# Patient Record
Sex: Female | Born: 1935 | Race: Black or African American | Hispanic: No | Smoking: Never smoker
Health system: Southern US, Community
[De-identification: ages and names within clinical notes are randomized; demographics above are authoritative.]

## PROBLEM LIST (undated history)

## (undated) DIAGNOSIS — I739 Peripheral vascular disease, unspecified: Secondary | ICD-10-CM

## (undated) DIAGNOSIS — K219 Gastro-esophageal reflux disease without esophagitis: Secondary | ICD-10-CM

## (undated) DIAGNOSIS — I779 Disorder of arteries and arterioles, unspecified: Secondary | ICD-10-CM

## (undated) DIAGNOSIS — M1990 Unspecified osteoarthritis, unspecified site: Secondary | ICD-10-CM

## (undated) DIAGNOSIS — E669 Obesity, unspecified: Secondary | ICD-10-CM

## (undated) DIAGNOSIS — E785 Hyperlipidemia, unspecified: Secondary | ICD-10-CM

## (undated) DIAGNOSIS — I10 Essential (primary) hypertension: Secondary | ICD-10-CM

## (undated) DIAGNOSIS — G4730 Sleep apnea, unspecified: Secondary | ICD-10-CM

## (undated) DIAGNOSIS — D649 Anemia, unspecified: Secondary | ICD-10-CM

## (undated) DIAGNOSIS — IMO0001 Reserved for inherently not codable concepts without codable children: Secondary | ICD-10-CM

## (undated) DIAGNOSIS — U071 COVID-19: Secondary | ICD-10-CM

## (undated) HISTORY — PX: WISDOM TOOTH EXTRACTION: SHX21

## (undated) HISTORY — DX: Hyperlipidemia, unspecified: E78.5

## (undated) HISTORY — PX: CHOLECYSTECTOMY: SHX55

---

## 1998-07-28 ENCOUNTER — Encounter: Payer: Self-pay | Admitting: Orthopedic Surgery

## 1998-08-02 ENCOUNTER — Observation Stay (HOSPITAL_COMMUNITY): Admission: RE | Admit: 1998-08-02 | Discharge: 1998-08-03 | Payer: Self-pay | Admitting: Orthopedic Surgery

## 1999-05-20 ENCOUNTER — Other Ambulatory Visit: Admission: RE | Admit: 1999-05-20 | Discharge: 1999-05-20 | Payer: Self-pay | Admitting: Obstetrics and Gynecology

## 2000-03-16 ENCOUNTER — Encounter: Payer: Self-pay | Admitting: Orthopedic Surgery

## 2000-03-16 ENCOUNTER — Encounter: Admission: RE | Admit: 2000-03-16 | Discharge: 2000-03-16 | Payer: Self-pay | Admitting: Orthopedic Surgery

## 2000-04-05 ENCOUNTER — Encounter: Payer: Self-pay | Admitting: Orthopedic Surgery

## 2000-04-12 ENCOUNTER — Ambulatory Visit (HOSPITAL_COMMUNITY): Admission: RE | Admit: 2000-04-12 | Discharge: 2000-04-12 | Payer: Self-pay | Admitting: Orthopedic Surgery

## 2000-06-14 ENCOUNTER — Observation Stay (HOSPITAL_COMMUNITY): Admission: RE | Admit: 2000-06-14 | Discharge: 2000-06-15 | Payer: Self-pay | Admitting: Orthopedic Surgery

## 2001-03-01 ENCOUNTER — Other Ambulatory Visit: Admission: RE | Admit: 2001-03-01 | Discharge: 2001-03-01 | Payer: Self-pay | Admitting: Obstetrics and Gynecology

## 2002-05-20 ENCOUNTER — Other Ambulatory Visit: Admission: RE | Admit: 2002-05-20 | Discharge: 2002-05-20 | Payer: Self-pay | Admitting: Obstetrics and Gynecology

## 2002-06-06 ENCOUNTER — Encounter: Admission: RE | Admit: 2002-06-06 | Discharge: 2002-09-04 | Payer: Self-pay | Admitting: Family Medicine

## 2002-07-03 ENCOUNTER — Encounter: Admission: RE | Admit: 2002-07-03 | Discharge: 2002-10-01 | Payer: Self-pay | Admitting: Family Medicine

## 2002-07-03 ENCOUNTER — Encounter: Payer: Self-pay | Admitting: Cardiology

## 2002-07-03 ENCOUNTER — Ambulatory Visit (HOSPITAL_COMMUNITY): Admission: RE | Admit: 2002-07-03 | Discharge: 2002-07-03 | Payer: Self-pay | Admitting: Cardiology

## 2002-07-04 ENCOUNTER — Encounter: Payer: Self-pay | Admitting: Cardiology

## 2003-06-03 ENCOUNTER — Other Ambulatory Visit: Admission: RE | Admit: 2003-06-03 | Discharge: 2003-06-03 | Payer: Self-pay | Admitting: Obstetrics and Gynecology

## 2003-09-05 HISTORY — PX: JOINT REPLACEMENT: SHX530

## 2003-12-28 ENCOUNTER — Inpatient Hospital Stay (HOSPITAL_COMMUNITY): Admission: RE | Admit: 2003-12-28 | Discharge: 2003-12-31 | Payer: Self-pay | Admitting: Orthopedic Surgery

## 2003-12-31 ENCOUNTER — Inpatient Hospital Stay (HOSPITAL_COMMUNITY)
Admission: RE | Admit: 2003-12-31 | Discharge: 2004-01-12 | Payer: Self-pay | Admitting: Physical Medicine & Rehabilitation

## 2004-02-18 ENCOUNTER — Encounter: Admission: RE | Admit: 2004-02-18 | Discharge: 2004-03-17 | Payer: Self-pay | Admitting: Orthopedic Surgery

## 2004-09-13 ENCOUNTER — Encounter: Admission: RE | Admit: 2004-09-13 | Discharge: 2004-12-12 | Payer: Self-pay | Admitting: Family Medicine

## 2004-10-18 ENCOUNTER — Encounter: Admission: RE | Admit: 2004-10-18 | Discharge: 2004-11-10 | Payer: Self-pay | Admitting: Family Medicine

## 2005-02-02 ENCOUNTER — Ambulatory Visit (HOSPITAL_COMMUNITY): Admission: RE | Admit: 2005-02-02 | Discharge: 2005-02-02 | Payer: Self-pay | Admitting: *Deleted

## 2006-01-18 ENCOUNTER — Encounter: Admission: RE | Admit: 2006-01-18 | Discharge: 2006-01-30 | Payer: Self-pay | Admitting: Family Medicine

## 2006-10-02 ENCOUNTER — Encounter: Admission: RE | Admit: 2006-10-02 | Discharge: 2006-12-31 | Payer: Self-pay

## 2008-06-04 ENCOUNTER — Ambulatory Visit: Payer: Self-pay | Admitting: Vascular Surgery

## 2010-02-25 ENCOUNTER — Encounter: Admission: RE | Admit: 2010-02-25 | Discharge: 2010-02-25 | Payer: Self-pay | Admitting: Family Medicine

## 2011-01-17 NOTE — Procedures (Signed)
CAROTID DUPLEX EXAM   INDICATION:  Followup carotid artery disease.   HISTORY:  Diabetes:  Yes.  Cardiac:  No.  Hypertension:  Yes.  Smoking:  No.  Previous Surgery:  No.  CV History:  Tingling in face, both sides intermittently for  approximately 1 year.  Amaurosis Fugax No, Paresthesias No, Hemiparesis No                                       RIGHT             LEFT  Brachial systolic pressure:  Brachial Doppler waveforms:         SCA-triphasic     SCA-triphasic  Vertebral direction of flow:        Antegrade         Antegrade  DUPLEX VELOCITIES (cm/sec)  CCA peak systolic                   74                74  ECA peak systolic                   63                87  ICA peak systolic                   68                [id number]  ICA end diastolic                   21                33  PLAQUE MORPHOLOGY:                  Mixed             Mixed  PLAQUE AMOUNT:                      Mild              Mild  PLAQUE LOCATION:                    ICA               ICA   IMPRESSION:  1. Right ICA shows evidence of 1-39% stenosis.  2. Left ICA shows evidence of 40-59% stenosis (low end of range).  3. Tortuous ICAs, especially the left which may overestimate      velocities.  4. No significant changes from previous study.  5. Note, deep vessels due to body habitus.   ___________________________________________  Nelda Severe. Kellie Simmering, M.D.   AS/MEDQ  D:  06/04/2008  T:  06/04/2008  Job:  [id number]

## 2011-01-20 NOTE — Discharge Summary (Signed)
NAME:  Jeanne [name], Jeanne [name] NO.:  [id number]   MEDICAL RECORD NO.:  [id number]                   PATIENT TYPE:  INP   LOCATION:  0482                                 FACILITY:  [facility]   PHYSICIAN:  Tarri Glenn, M.D.               DATE OF BIRTH:  11-04-1935   DATE OF ADMISSION:  12/28/2003  DATE OF DISCHARGE:                                 DISCHARGE SUMMARY   ADMITTING DIAGNOSES:  1. Severe osteoarthritis of right knee.  2. Type 1 diabetes.  3. Hypertension.  4. Morbid obesity.   DISCHARGE DIAGNOSES:  1. Severe osteoarthritis of right knee.  2. Type 1 diabetes.  3. Hypertension.  4. Morbid obesity.  5. Postoperative anemia.  6. Hyponatremia.   OPERATION:  On December 28, 2003 the patient underwent complex primary  Osteonics total knee replacement arthroplasty of the right knee utilizing a  revision tibial component.  Ronald A. Gioffre, M.D. assisted.   CONSULTS:  Dr. Lenna Sciara L. Lovena Le and [employer], et. al.   BRIEF HISTORY:  This is a 75 year old lady who has deterioration of her  right knee secondary to osteoarthritis.  Arthroscopy in the past as well as  Hyalgan injections have not ameliorated her discomfort.  The x-rays showed  bone-on-bone deformity with near total erosion of the articular cartilage.  She is an extremely obese lady who has been trying to lose weight but her  right knee has deteriorated to the point she has difficulty getting about or  even exercising.  It was decided she would benefit from total knee  replacement arthroplasty and she was scheduled for the above surgery.   COURSE IN THE HOSPITAL:  The patient tolerated the surgical procedure quite  well.  We called [facility] per Dr. March Rummage recommendation and  they followed her medically during her hospitalization.  At his suggestion  she was placed on Glucomander and they also followed that.  She had no  medical crises.  The patient was extremely slow in  her level of activity due  to her mass.  She could not tolerate the CPM machine very well and really  only achieved a little over 30 degrees.  It was felt that she might benefit  with an inpatient rehab consult.  We asked Dr. Naaman Plummer to see the patient and  he agreed that she benefit for same and arrangements were made for her  discharge there.   The patient was placed on Lovenox/Coumadin protocol postoperatively for  prevention of DVT.  Her calf remained soft on the right and Homans sign was  negative.  The Hemovac drain was pulled on postoperative day #1.  The wound  remained dry with only minimal amount of serosanguineous drainage.  The  patient did develop a postoperative anemia.  We checked her hemoglobin.  Preoperatively her hemoglobin was 11.7, hematocrit was 33.3.  Postoperatively it dropped 1 g and on the day of discharge it  was 8.4.  I  discussed with Dr. Sherrian Divers whether to transfuse.  We usually transfuse at  this level but since she was being transferred to [facility] rehab it was felt  rather than hold up the transfer that blood replacement could occur there.  She had a mild hyponatremia and I withheld her water and encouraged juices  and colas.  The patient had a urinary tract infection preoperatively per  urinalysis but when rechecked it was essentially negative.   Laboratory values in the hospital hematologically showed a mild anemia  preoperatively with RBC normal at 4.35, hemoglobin was 11.7, hematocrit was  35.3.  Final hemoglobin was 8.4, hematocrit was 25.4.  Sodium was 133.  Other laboratory values were essentially within normal limits.  Glucose  stayed above 100.  The last one done was 159.  Other blood chemistries and  sodium were normal.  Urinalysis colony count on culture was pending at the  time of transfer.  Chest x-ray showed no acute lung disease.  Electrocardiogram showed normal sinus rhythm, normal ECG.   CONDITION ON DISCHARGE:  Improved and stable.   PLAN:   The patient is transferred to [place] for an intensive  inpatient rehabilitation program.  She may continue weightbearing as  tolerated on the right lower extremity.  She may flex the knee as tolerated.  Again, we will certainly leave the care of this patient in the capable hands  of the physicians and physicians' assistants in the rehabilitation center.  Should there be any questions concerning her overall medical history, either  Dr. Sheryn Bison can be contacted or [facility], et. al.   CURRENT MEDICATIONS AT THE TIME OF DISCHARGE:  1. Colace b.i.d.  2. Trinsicon t.i.d.  3. Lovenox protocol.  4. Vasotec 10 mg daily.  5. Ditropan XL 5 mg daily.  6. Hydrochlorothiazide 50 mg one daily.  7. Avandia 4 mg daily.  8. Zocor 20 mg daily.  9. Diltiazem 240 mg daily.  10.      Reglan 10 mg p.r.n. nausea.  11.      Robaxin as a muscle relaxant.  12.      Hydrocodone for pain control as well as Tylox for severe pain.   Her PCA was discontinued at discharge.     Dooley L. Vanita Ingles.                 Tarri Glenn, M.D.    DLU/MEDQ  D:  12/31/2003  T:  12/31/2003  Job:  [id number]   cc:   Russ Halo. Sheryn Bison, M.D.  [address]  [place]  [place] [address]  Fax: [phone]

## 2011-01-20 NOTE — Op Note (Signed)
NAME:  PROSPERITY, ANDRASKO NO.:  [id number]   MEDICAL RECORD NO.:  [id number]                   PATIENT TYPE:  INP   LOCATION:  [place]                                 FACILITY:  [facility]   PHYSICIAN:  Tarri Glenn, M.D.               DATE OF BIRTH:  08-14-1936   DATE OF PROCEDURE:  12/28/2003  DATE OF DISCHARGE:                                 OPERATIVE REPORT   PREOPERATIVE DIAGNOSIS:  1. Osteoarthritis, right knee.  2. Morbid obesity.   POSTOPERATIVE DIAGNOSIS:  1. Osteoarthritis, right knee.  2. Morbid obesity.   OPERATION:  Complex primary Osteonics total knee replacement, right,  utilizing a revision tibial component.   SURGEON:  Tarri Glenn, M.D.   ASSISTANT:  Kipp Brood. Gladstone Lighter, M.D.   ANESTHESIA:  General.   INDICATIONS FOR PROCEDURE:  She was morbidly obese.  Had significant pain in  the right knee with some knee subluxation of the patella on the femur.  Because of the size and deformity, I felt that the revision tibial stem, as  discussed below, would be the treatment of choice, along with the Scorpio  posterior cruciate sacrificing modality.   PROCEDURE:  Prophylactic antibiotics.  Satisfactory general anesthesia.  A  Foley catheter inserted.  We went without a pneumonic tourniquet because of  her size and short thigh.  Right leg was prepped with Duraprep from groin to  toes and draped in the sterile field.  Used a lateral hip positioner and a  Sure-foot stabilizer.  Ioban applied to the vertical midline incision down  to the patellar mechanism with median parapatellar incision at the end of  the joint.  Bleeders were aggressively managed.  The patellar mechanism was  freed up, but because of her size, I had performed most of the case with it  not everted but off to the lateral side.  Osteophytes were around the femur.  Patella were removed.  The pes anserinus and medial collateral ligament were  undermined off the tibia.  The knee  was flexed, and remnants of the menisci,  ACL and posterior cruciate ligaments were removed.  I then made a 5 to 6  inch drill hole in the distal femur followed by the canal finder and the  axis liner, set a 5 degree cut at the distal femur for the right knee.  I  elected to take 12 mm off because of the tightness of her knee.  Because of  the tightness, I then made a 11 o'clock cut on the tibia, which gave more  room for the sizing cradle, which we felt was a #7.  Scribe lines were  placed in the distal femur.  I then placed a distal femoral cutting jig and  then made my anterior posterior cuts and posterior anterior chamfers.  Returning to the tibia, I sized a 7 template, and we then made our initial  primary cut, first making an intramedullary  drill hole followed by a step-  cut drill and used the intramedullary rod with a 0 degree cut posteriorly, 4  mm off the medial side.  I then used a micro saw to make initial cuts for  the post and unicondylar notch of the femur.  I then placed a guide,  normally making the patellar groove but also removing bone and impacting it  for the post.  I then used the laminar spreader and removed remnants of bone  and soft tissue posteriorly, and we went through a trial reduction and found  that we had adequate bone resection and would be a 12 or 15 mm spacer within  the extension, then used the 10 mm recessed cutting jig to make this cut for  26 mm patella.  We then used the guide for making three fixation drill  holes.  A trial prosthesis is placed, and excess bone removed from around  the perimeter of the prosthesis.  We then returned to the tibia, where we  used the revision equipment and found that a 4 mm off-set was the  appropriate size and with a 200 degree setting for ideal positioning of the  tibial tray.  The tray was then placed, and the tripod apparatus made to  ream down for a 15 mm in diameter, 80 mm length of the stem.  When the   preparation had been completed, we went through a trial reduction with trial  components which were put together with the appropriate parameters.  The  knee was then taken through a nice range of motion and based on this, we  went ahead and constructed the final tibial components, as described above.  The wound was meanwhile water-picked and methacrylate-mixed.  The three  individual components were then placed, impacting first the tibia and  removing excess methacrylate, then the femur and holding the knee in  extension.  The 50 mm spacer while the patella was methyl methacrylate  in.  When the methacrylate hardened, we removed remnants of methacrylate from  around the components and went through another trial reduction with a 15 mm  spacer being the ideal size.  The final 15 mm spacer was placed, the knee  reduced, and found to be nice and stable.  Lateral release was performed,  and the Hemovac placed.  The wound was then closed in layers with #1 Vicryl,  in two layers in the quadriceps, and distally in two layers in the synovium  and capsular layers.  I then closed the subcutaneous tissue with interrupted  #1 Vicryl deep and 3-0 Vicryl superficially with staples in the skin.  Betadine and Adaptic dry sterile dressing were applied.  At the time of this  dictation, she was on her way to the recovery room in satisfactory condition  with no complications.  Estimated blood loss was 700 cc with 1 unit of blood  replacement.                                               Tarri Glenn, M.D.    JA/MEDQ  D:  12/28/2003  T:  12/28/2003  Job:  [id number]

## 2011-01-20 NOTE — Op Note (Signed)
[facility]  Patient:    Jeanne [name], Jeanne [name]                      MRN: [id number] Proc. Date: 06/14/00 Attending:  Jeneen Rinks P. Aplington, M.D.                           Operative Report  PREOPERATIVE DIAGNOSES: 1. Osteoarthritis. 2. Torn medial meniscus, right knee  POSTOPERATIVE DIAGNOSES: 1. Osteoarthritis. 2. Torn medial and lateral menisci, right knee.  OPERATION:  Right knee arthroscopy with: 1. Partial medial and lateral meniscectomy. 2. Shaving of medial and lateral femoral condyle. 3. Removal of loose body, medial knee joint.  SURGEON:  Laurice Record. Aplington, M.D.  ASSISTANT:  Nurse.  ANESTHESIA:  General.  PATHOLOGY AND JUSTIFICATION FOR PROCEDURE:  Despite the fact that she has significant osteoarthritis in both knees, she had done very well with a left knee arthroscopy, and we attempted a right knee arthroscopy on August 9 but because of the size of her knee (she weighs over 375 pounds), she had what I felt was a lot of extravasation from around the cannula, and early in the operation we were unable to continue because of no inflow.  Accordingly, at this time we are prepared with much longer hip arthroscopy instruments.  DESCRIPTION OF PROCEDURE:  Satisfactory general anesthesia, pneumatic tourniquet, thigh stabilizer.  Right knee and leg were prepped with Duraprep and draped in a sterile field.  The pneumatic tourniquet was not inflated but was held in reserve.  I had hoped to avoid venous congestion in this manner. The anatomy of the knee was marked out with a marking pen.  I first entered the joint through an anterior medial portal and with joint fluid coming forth, I felt that we were definitely in the joint and I inflated the knee from this portal.  I then used my superior medial inflow portal from the previous surgery and used the extra-long cannula from the hip arthroscopy apparatus and was able to enter the joint, and backflow  confirmed that we were in the knee joint.  I then used this cannula for the inflow.  Next, through an anterolateral portal, we looked first at the medial compartment of the knee joint.  There was a large amount of debris in the way of the medial femoral condyle.  I cleaned things up with the 4.2 shaver, performed partial synovectomy, shaving the medial femoral condyle and also removing a few millimeter size bony loose body, which was smooth, with small baskets.  Even with the large inflow, we had difficulties with the inflow cutting off because of the size of the knee and positioning of the knee, but I was gradually able to work my way posteriorly, and she had a significant tear involving the entire posterior third of the medial meniscus.  Beginning first at the curve, I began resecting meniscus with baskets and then shaved down the remaining rim until smooth with 3.5 shaver and in addition shaved the medial femoral condyle additionally posteriorly and slightly to the medial tibial plateau.  Final remaining rim was nice and stable.  Pre- and post-films were taken.  The ACL was intact.  I was unable to look up in the medial gutter and suprapatellar area from this portal because of the cannula.  I then reversed portals.  She had a large amount of debris and wear in the lateral compartment of the knee joint as well,  and this was once again debrided out with the 4.2 shaver.  I was then able to look at the lateral meniscus, which was torn extensively from the mid-lateral portion all the way into the intercondylar area.  This was once again trimmed with baskets and shaved with the 3.5 shaver, including additional trimming of both the lateral femoral condyle and lateral tibial plateau.  I then looked up the lateral gutter and suprapatellar area.  The patella had a little wear but nothing that needed shaving.  The knee joint was then irrigated until clear and all fluid possible removed.  The two  anterior portals were closed with 4-0 nylon.  Marcaine 0.5% 20 cc with adrenalin and 4 mg of morphine were then instilled through the inflow apparatus, which was removed and this portal closed with 4-0 nylon as well.  ABD and adaptic and dry sterile dressing were applied.  She tolerated the procedure well and was taken to the recovery room in satisfactory condition with no known complications. DD:  06/14/00 TD:  06/15/00 Job: [id number]

## 2011-01-20 NOTE — H&P (Signed)
NAME:  Jeanne [name], Jeanne [name] NO.:  [id number]   MEDICAL RECORD NO.:  [id number]                   PATIENT TYPE:  INP   LOCATION:  NA                                   FACILITY:  [facility]   PHYSICIAN:  Tarri Glenn, M.D.               DATE OF BIRTH:  11-22-1935   DATE OF ADMISSION:  12/28/2003  DATE OF DISCHARGE:                                HISTORY & PHYSICAL   CHIEF COMPLAINT:  Pain in my right knee.   HISTORY OF PRESENT ILLNESS:  This 75 year old lady has been seen by Dr.  Shellia Carwin for continuing progressive problems concerning her right knee. She  has had arthroscopic surgery and Hyalgan injections into this knee in the  past, but unfortunately this has not improved her function of the knee. She  is a very large, obese lady which puts stress on the knees, particularly on  the right. She has tried weight loss programs in the past. She is currently  on Curves. The patient has crepitus on range of motion of the right knee,  has an early valgus deformity. X-rays show bone-on-bone deformity,  specifically depreciating the medial shift of the femur on the tibia.  Osteophytes are also seen. This is a relatively active lady, but  unfortunately she has been unable to truly participate in an exercise  program due to her right knee pain. She, therefore, is unable to loose a  significant amount of weight that could protect the knee and her condition  now has deteriorated where total knee replacement arthroplasty is indicated.  After much discussion, an approval for the surgery as far as her medical  status is concerned by Dr. Aura Dials and it was decided to go ahead  with surgery.   PAST MEDICAL HISTORY:  Dr. Sheryn Bison is treating her for hypertension, insulin-  dependent diabetes mellitus (type 1).   CURRENT MEDICATIONS:  1. Diltiazem 120 mg one daily.  2. Pravachol 40 mg daily.  3. Ditropan 5 mg daily.  4. Metformin 2.5 mg daily.  5. Hydrochlorothiazide  50 mg daily.  6. Avandia 8 mg daily.  7. Enalapril 10 mg daily.  8. She also takes insulin.   ALLERGIES:  No known drug allergies, however she had some sort of reaction  of which she thinks to be oxycodone in the past after some dental surgery.  She will check this in her records and bring the information to the hospital  with her.   PAST SURGICAL HISTORY:  Arthroscopic surgery to both knees, with the one on  the right done in 2001. She had a cholecystectomy in 1963.  Dr. Aura Dials of [facility] is her family  physician.   FAMILY HISTORY:  Positive for heart disease in her mother and brother.  Almost of her brothers and sisters have hypertension, diabetes in her  daughter and brother. Her son had melanoma. Her husband  had a stroke on the  right and she has osteoarthritis of course.   SOCIAL HISTORY:  She is married, retired. No intake of alcohol or tobacco  products. She has three children. Her family will be her major caregivers  when she does arrive home.   REVIEW OF SYSTEMS:  CNS: No seizure, syncope, paralysis, numbness, or double  vision. RESPIRATORY:  No productive cough, hemoptysis. Shortness of breath  secondary to her obesity. GASTROINTESTINAL: She does well with her  medication. No nausea, vomiting, melena or bloody stool. GENITOURINARY: No  dysuria, hematuria, or discharge. MUSCULOSKELETAL: Primarily in present  illness.   PHYSICAL EXAMINATION:  VITAL SIGNS: Pulse 72 and regular, respirations 12  and labored., blood pressure 140/82 with large cuff.  GENERAL: A very cooperative, friendly, obese 75 year old black female.  HEENT:  Normocephalic. PERRLA. Oropharynx is clear.  CHEST: Clear to auscultation. No rhonchi or rales. However, breath sounds  are distant.  HEART: Regular rate and rhythm.  No murmurs are heard, but heart sounds are  distant.  ABDOMEN: Obese, soft, and nontender.  RECTAL/BREASTS/GENITALIA: Not done; not  pertinent to the present illness.  EXTREMITIES: Crepitus with range of motion of the right knee; tenderness to  palpation particularly on the medial aspect.  NEUROLOGIC: Intact to the right lower extremity. She has sensation intact in  the lower extremities as well.   ADMISSION DIAGNOSES:  1. Severe osteoarthritis of the right knee.  2. Type 1 diabetes.  3. Hypertension.  4. Morbid obesity.   PLAN:  The patient will be admitted to [facility] and undergo  right total knee replacement, arthroplasty. Will ask the [facility] hospitalist,  Dr. March Rummage direction, to follow along with [place] during this patient's  hospitalization particularly for her insulin-dependent diabetes mellitus.   We discussed her postoperative course after her acute care setting ended. In  all probability, she would do very well in the inpatient rehabilitation  program for a week or so. She does have her family rotating to assist her at  home when she does get home.  We will certainly ask for a rehab consult  during this hospitalization.     Dooley L. Vanita Ingles.                 Tarri Glenn, M.D.    DLU/MEDQ  D:  12/22/2003  T:  12/22/2003  Job:  [id number]   cc:   Russ Halo. Sheryn Bison, M.D.  [address]  [place]  [place] [address]  Fax: [phone]

## 2011-01-20 NOTE — Consult Note (Signed)
NAME:  NYHA, PADO NO.:  [id number]   MEDICAL RECORD NO.:  [id number]                   PATIENT TYPE:  INP   LOCATION:  [place]                                 FACILITY:  [facility]   PHYSICIAN:  Melissa L. Lovena Le, MD               DATE OF BIRTH:  Oct 12, 1935   DATE OF CONSULTATION:  DATE OF DISCHARGE:                                   CONSULTATION   REQUESTING PHYSICIAN:  Dr. Tarri Glenn.   REASON FOR CONSULTATION:  Postoperative hypertension and diabetes  management.   HISTORY OF PRESENT ILLNESS:  The patient is a 75 year old, morbidly obese,  African-American female with past medical history for insulin-dependent  diabetes and hypertension.  She is postop day #1, status post right TKR and  has been treated overnight with a Glucomander for her blood sugar control.  She tolerated this well.  Her sugars have been in the low 100s.  Blood  pressures have been mildly elevated over the course of the night but not  excessively.   PAST MEDICAL HISTORY:  1. Hypertension.  2. Insulin-dependent diabetes.  3. Morbid obesity.  4. Occasional migraines.  5. Osteoarthritis.   PAST SURGICAL HISTORY:  1. Cholecystectomy.  2. Two knee arthroscopies prior to this surgery.   ALLERGIES:  Possibly to OXYCODONE, although it is unclear what the reaction  was.   HOME MEDICATIONS:  1. Diltiazem 120 mg daily.  2. Pravachol 40 mg daily.  3. Ditropan 5 mg daily.  4. Metformin 500 mg b.i.d.  5. Hydrochlorothiazide 50 mg daily.  6. Avandia 8 mg daily.  7. Enalapril 10 mg daily.  8. Insulin in the morning is regular insulin 6 units and NPH 30 units; in     the evening regular is __________ units and NPH 6 units.  9. She requires CPAP at home.   HOSPITAL MEDICATIONS:  1. She is on a PCA morphine pump.  2. Coumadin 10 mg q.h.s.  3. Lovenox 30 mg q.12h.  4. Glucomander.  5. __________ 100 mg b.i.d.  6. Trinsicon/Foltrin caps t.i.d.  7. Ditropan 5 mg daily.  8.  Hydrochlorothiazide 50 mg daily.  9. Diltiazem 120 mg daily.  10.      Avandia 8 mg daily to start back today.  11.      Zocor 20 mg daily.  12.      Vitamin B and C, Ancef, Reglan, Zofran, and Robaxin all p.r.n.   SOCIAL HISTORY:  She does not smoke.  She does not drink.  She is married.   FAMILY HISTORY:  Her mother had coronary artery disease.   REVIEW OF SYSTEMS:  The patient complains of well-controlled pain, no  shortness of breath, no dysuria, positive thirst, all else appeared to be  negative.   PHYSICAL EXAMINATION:  VITAL SIGNS:  Temperature 96.5, blood pressure  194/64, however, this a.m. is 162/70.  Pulse is 88, respirations 20,  saturations 96%  on 2 liters.  GENERAL:  She is in no acute distress.  HEENT:  Normocephalic, atraumatic.  Pupils equal, round, and reactive to  light.  Extraocular muscles are intact.  Mucous membranes are moist.  NECK:  Supple and thick.  There are no bruits.  No lymph nodes.  CHEST:  Decreased in all fields secondary to size.  She has no yeast under  her breasts or under her panus.  CARDIOVASCULAR:  Regular rate and rhythm, positive S1, S2, no S3, S4.  No  murmurs, rubs, or gallops.  ABDOMEN:  Morbid obesity.  No yeast under her panus.  Positive bowel sounds.  No pain.  Hepatosplenomegaly cannot be appreciated.  EXTREMITIES:  Her right in a splint and ice-packed.  She does have good cap  refill and 2+ pulses.  Her left leg is in a TED stocking.  It does show 2+  pulses.  She does have a Foley to gravity.  Her laboratory values revealed a  PT of 13.1, INR 1.  H&H is 10.6 and 31.6, respectively.  Her UA showed  leukocyte esterase with 3-6 WBCs.   ASSESSMENT AND PLAN:  This is a 75 year old African-American female, status  post total knee replacement.  The patient did well overnight on the  Glucomander, and she will be starting limited oral intake today.  We will  recommended restarting her long-acting insulin with sliding-scale regular   coverage and consider starting her Metformin in the a.m.  Her Avandia is  already on board to be started today.  1. Insulin-dependent diabetes.  We will give her Humulin at 8 a.m. of 20     units subcu then wait an hour before discontinuing her Glucomander.  We     will check her CBGs q.4h. x 24 then go to a.c. and h.s.  We will decide     on a p.m. dosing of NPH for this evening, and there will be a appropriate     and reflect the course of her day.  Avandia 8 mg will start, and we will     consider using Metformin in the morning, as stated.  2. Hypertension.  It is moderately controlled.  Overnight, she had     noticeable increases in her systolic pressure, likely secondary to pain;     however, we will follow this closely.  She will receive her Vasotec,     Diltiazem, hydrochlorothiazide, and we will titrate those drugs to     effect.  3. Genitourinary.  There is a possibly that this patient has a urinary tract     infection, so we will recheck a urine dip and if leukocyte esterases are     positive, white blood cells are positive, then I would recommend starting     Cipro.   Thank you for the opportunity to evaluate your patient.  We will follow  closely along with you and adjust her medications in a timely manner.  Please call if you have any questions.  You can reach [place] through our office,  general number [phone] and our switchboard will give you to the hospitalist  taking calls.                                               Melissa L. Lovena Le, MD    MLT/MEDQ  D:  12/29/2003  T:  12/29/2003  Job:  [id number]   cc:   Janifer Adie, M.D.  [place]  [place] [address]  Fax: [phone]   Tarri Glenn, M.D.  [address]  [place]  [place] [address]  Fax: [phone]

## 2011-01-20 NOTE — Discharge Summary (Signed)
NAME:  Jeanne [name], Jeanne [name] NO.:  [id number]   MEDICAL RECORD NO.:  [id number]                   PATIENT TYPE:  IPS   LOCATION:  [id number]                                 FACILITY:  [facility]   PHYSICIAN:  Meredith Staggers, M.D.             DATE OF BIRTH:  Jan 02, 1936   DATE OF ADMISSION:  12/31/2003  DATE OF DISCHARGE:  01/11/2004                                 DISCHARGE SUMMARY   DISCHARGE DIAGNOSES:  1. Right total knee arthroplasty April 25 secondary to degenerative joint     disease.  2. Anemia.  3. Pain management.  4. Insulin-dependent diabetes mellitus.  5. Hypertension.  6. Sleep apnea.  7. Hyperlipidemia.  8. Morbid obesity.   HISTORY OF PRESENT ILLNESS:  A 75 year old female admitted to [facility] April 25 with end-stage degenerative changes of the right knee and  no relief with conservative care. Underwent a right total knee arthroplasty  April 25 per Dr. Shellia Carwin. Placed on Coumadin for deep vein thrombosis  prophylaxis. Hospital course:  Anemia 8.4 and monitored. Hospital course  uneventful. No chest pain. No nausea or vomiting. Total assist for  transfers. Admitted for comprehensive rehab program.   PAST MEDICAL HISTORY:  See discharge diagnoses.   PAST SURGICAL HISTORY:  1. Cholecystectomy.  2. Knee arthroscopy.   ALLERGIES:  Questionable to OXYCODONE.   PRIMARY CARE PHYSICIAN:  Dr. Bradd Burner.   HABITS:  Denies alcohol or tobacco.   MEDICATIONS PRIOR TO ADMISSION:  1. Cardizem 120 daily.  2. Pravachol 40 mg daily.  3. Ditropan 5 mg daily.  4. Metformin 2.5 mg daily.  5. Hydrochlorothiazide 50 mg daily.  6. Avandia 8 mg daily.  7. Enalapril 10 mg daily.  8. Insulin, questionable dosage.   SOCIAL HISTORY:  Lives with husband in [place]. Independent prior to  admission. Used a cane. Husband with history of cerebrovascular accident and  limited assistance. One level home. Handicap equipped. Daughter can assist  on  discharge. There is also a son of the home who works day shift.   HOSPITAL COURSE:  The patient did well on rehabilitation services with  therapies initiated on a b.i.d. basis. The following issues were followed  during patient's rehab course. Pertaining to Ms. Hindley's right total knee  arthroplasty, surgical site healing nicely. Staples had been removed. No  signs of infection. She was supervision for her ambulation. Required simple  set up for activities of daily living. Knee range of motion of 72 degrees  with plan for home CPM machine. Postoperative anemia. Hemoglobin of 8.1 on  April 29. She was transfused 2 units of packed red blood cells. Hemoglobin  remained stable at 10.7. Pain management ongoing with the use of Vicodin.  Insulin-dependent diabetes mellitus with blood sugars of 86, 112, 117. She  continued on her diabetic diet. Blood pressures controlled with home regimen  of Cardizem, Vasotec, and hydrochlorothiazide. She continued on her CPAP  machine for history of sleep apnea. She had no bowel or bladder  disturbances. She was discharged to home with home health therapies.   Latest labs showed an INR of 3.2, hemoglobin 10.7, hematocrit 32.4. Sodium  137, potassium 3.4, BUN 11, creatinine 0.9.   DISCHARGE MEDICATIONS:  1. Coumadin, latest dose of 4 mg, she received on May 8. She was to receive     8 mg daily except 4 mg on Tuesday and Thursday. This dose would be     adjusted according at time of discharge with prothrombin times pending.     Coumadin to be completed Jan 27, 2004.  2. Cardizem 240 mg daily.  3. Vasotec 10 mg daily.  4. Hydrochlorothiazide 50 mg daily.  5. Avandia 8 mg daily.  6. Zocor 20 mg daily.  7. Trinsicon twice daily.  8. Lantus insulin 15 units at bedtime.  9. NPH insulin 35 units in the a.m., 18 units in the p.m.  10.      Potassium chloride 20 mEq daily.  11.      Ditropan XL 10 mg daily.  12.      Amoxicillin 250 mg three times daily for  pseudomonas urinary tract     infection.  13.      Vicodin as needed pain.   ACTIVITY:  As tolerated. CPM machine 0 to 80 degrees, increase by 10 degrees  daily to a maximum of 90 degrees. Home health physical and occupational  therapy. Home health nurse per [facility] to complete  Coumadin protocol.      Lauraine Rinne, P.A.                     Meredith Staggers, M.D.    DA/MEDQ  D:  01/11/2004  T:  01/12/2004  Job:  [id number]   cc:   Tarri Glenn, M.D.  [place]  [place] [address]  Fax: [phone]   Janifer Adie, M.D.  [place]  [place] [address]  Fax: [phone]

## 2011-01-20 NOTE — Op Note (Signed)
[facility]  Patient:    Jeanne [name], Jeanne [name]                      MRN: [id number] Proc. Date: 04/12/00 Attending:  Jeneen Rinks P. [place], M.D.                           Operative Report  PREOPERATIVE DIAGNOSES: 1. Osteoarthritis. 2. Torn medial and lateral menisci, right knee.  POSTOPERATIVE DIAGNOSES: 1. Osteoarthritis. 2. Torn medial and lateral menisci, right knee.  OPERATION PERFORMED:  Right knee arthroscopy attempted but not completed.  SURGEON:  Dr. Shellia Carwin.  ASSISTANT:  Nurse.  ANESTHESIA:  General.  PATHOLOGY AND JUSTIFICATION FOR PROCEDURE:  She weighs over 350 pounds, exact weight is unknown. I successfully performed an arthroscopy on her left knee. Her right knee had severe osteoarthritic changes in the medial joint primarily but also by MRI had torn medial and lateral menisci. Because she had had excellent relief of pain in her left knee although it was arthritic as well, she is here today for the arthroscopy in the right knee.  DESCRIPTION OF PROCEDURE:  Satisfactory general anesthesia, pneumatic tourniquet, thigh stabilizer, the right knee was prepped with duraprep, draped in a sterile field. Superior medial inflow with flow initially. First through the anterolateral portal, I was able to get into the medial knee joint and took 1 picture and confirmed the arthritic changes. What did look like a torn medial meniscus. However, at this point our inflow would cut on and off and even when it was running ran in the range of 50 to 100 ml per minute and was not enough to sustain the arthroscopy. Despite multiple attempts to change the portal medially, we were unable to rectify the situation. I then went laterally and made 2 attempts to establish a good portal. The knee was extremely large and we were never able to get any flow even though it felt like I was in the knee joint. Her knee was quite arthritic. I then tried working through the  inflow on the cannula and we were also not able to sustain any flow in this fashion. After a total of 51 minutes of tourniquet time, I finally had to abort the case because I was unable to continue because of inability to establish any inflow. Her knee was getting edematous by this time and the assessment of all of [place] in the operating room was that there was no mechanical failure but that her soft tissues were just so large that the cannulas were not reaching into the joint plus possibly a combination of osteoarthritis preventing ideal placement of the cannula although I think it was the depth of the soft tissue which was the crux of the problem. Accordingly, I infiltrated all of her portals with 0.5% plain Marcaine and also infiltrated the joint with 20 cc of the same. The portals were closed with 4-0 nylon. Betadine Adaptic dry sterile dressing were applied. The patient tolerated the procedure well and was taken to the recovery room in satisfactory condition with no known complications medically. DD:  04/12/00 TD:  04/12/00 Job: [id number]

## 2011-09-11 ENCOUNTER — Emergency Department (HOSPITAL_COMMUNITY)
Admission: EM | Admit: 2011-09-11 | Discharge: 2011-09-11 | Disposition: A | Discharge status: Home / Self Care | Payer: Medicare Other | Attending: Emergency Medicine | Admitting: Emergency Medicine

## 2011-09-11 ENCOUNTER — Encounter: Payer: Self-pay | Admitting: Emergency Medicine

## 2011-09-11 ENCOUNTER — Emergency Department (HOSPITAL_COMMUNITY): Payer: Medicare Other

## 2011-09-11 DIAGNOSIS — M129 Arthropathy, unspecified: Secondary | ICD-10-CM | POA: Insufficient documentation

## 2011-09-11 DIAGNOSIS — Z79899 Other long term (current) drug therapy: Secondary | ICD-10-CM | POA: Insufficient documentation

## 2011-09-11 DIAGNOSIS — G4730 Sleep apnea, unspecified: Secondary | ICD-10-CM | POA: Insufficient documentation

## 2011-09-11 DIAGNOSIS — I6522 Occlusion and stenosis of left carotid artery: Secondary | ICD-10-CM | POA: Insufficient documentation

## 2011-09-11 DIAGNOSIS — G43909 Migraine, unspecified, not intractable, without status migrainosus: Secondary | ICD-10-CM | POA: Insufficient documentation

## 2011-09-11 DIAGNOSIS — N390 Urinary tract infection, site not specified: Secondary | ICD-10-CM | POA: Insufficient documentation

## 2011-09-11 DIAGNOSIS — R109 Unspecified abdominal pain: Secondary | ICD-10-CM | POA: Insufficient documentation

## 2011-09-11 DIAGNOSIS — R197 Diarrhea, unspecified: Secondary | ICD-10-CM | POA: Insufficient documentation

## 2011-09-11 DIAGNOSIS — K219 Gastro-esophageal reflux disease without esophagitis: Secondary | ICD-10-CM | POA: Insufficient documentation

## 2011-09-11 DIAGNOSIS — D649 Anemia, unspecified: Secondary | ICD-10-CM | POA: Insufficient documentation

## 2011-09-11 DIAGNOSIS — I10 Essential (primary) hypertension: Secondary | ICD-10-CM | POA: Insufficient documentation

## 2011-09-11 DIAGNOSIS — Z7982 Long term (current) use of aspirin: Secondary | ICD-10-CM | POA: Insufficient documentation

## 2011-09-11 DIAGNOSIS — D638 Anemia in other chronic diseases classified elsewhere: Secondary | ICD-10-CM | POA: Insufficient documentation

## 2011-09-11 DIAGNOSIS — E119 Type 2 diabetes mellitus without complications: Secondary | ICD-10-CM | POA: Insufficient documentation

## 2011-09-11 DIAGNOSIS — G4733 Obstructive sleep apnea (adult) (pediatric): Secondary | ICD-10-CM | POA: Insufficient documentation

## 2011-09-11 DIAGNOSIS — I779 Disorder of arteries and arterioles, unspecified: Secondary | ICD-10-CM | POA: Insufficient documentation

## 2011-09-11 DIAGNOSIS — K625 Hemorrhage of anus and rectum: Secondary | ICD-10-CM | POA: Insufficient documentation

## 2011-09-11 HISTORY — DX: Unspecified osteoarthritis, unspecified site: M19.90

## 2011-09-11 HISTORY — DX: Essential (primary) hypertension: I10

## 2011-09-11 HISTORY — DX: Gastro-esophageal reflux disease without esophagitis: K21.9

## 2011-09-11 HISTORY — DX: Anemia, unspecified: D64.9

## 2011-09-11 HISTORY — DX: Peripheral vascular disease, unspecified: I73.9

## 2011-09-11 HISTORY — DX: Disorder of arteries and arterioles, unspecified: I77.9

## 2011-09-11 HISTORY — DX: Sleep apnea, unspecified: G47.30

## 2011-09-11 HISTORY — DX: Reserved for inherently not codable concepts without codable children: IMO0001

## 2011-09-11 HISTORY — DX: Obesity, unspecified: E66.9

## 2011-09-11 LAB — CBC
HCT: 36.4 % (ref 36.0–46.0)
Hemoglobin: 11.9 g/dL — ABNORMAL LOW (ref 12.0–15.0)
RBC: 4.37 MIL/uL (ref 3.87–5.11)
WBC: 6.8 10*3/uL (ref 4.0–10.5)

## 2011-09-11 LAB — BASIC METABOLIC PANEL
Chloride: 99 mEq/L (ref 96–112)
GFR calc Af Amer: 56 mL/min — ABNORMAL LOW (ref >90)
Potassium: 3.6 mEq/L (ref 3.5–5.1)

## 2011-09-11 LAB — URINE CULTURE: Culture  Setup Time: 201301071707

## 2011-09-11 LAB — URINE MICROSCOPIC-ADD ON

## 2011-09-11 LAB — OCCULT BLOOD, POC DEVICE: Fecal Occult Bld: NEGATIVE

## 2011-09-11 LAB — DIFFERENTIAL
Lymphocytes Relative: 32 % (ref 12–46)
Monocytes Absolute: 0.6 10*3/uL (ref 0.1–1.0)
Monocytes Relative: 9 % (ref 3–12)
Neutro Abs: 3.8 10*3/uL (ref 1.7–7.7)

## 2011-09-11 LAB — URINALYSIS, ROUTINE W REFLEX MICROSCOPIC
Bilirubin Urine: NEGATIVE
Nitrite: POSITIVE — AB
Specific Gravity, Urine: 1.008 (ref 1.005–1.030)
pH: 6 (ref 5.0–8.0)

## 2011-09-11 MED ORDER — MORPHINE SULFATE 4 MG/ML IJ SOLN
6.0000 mg | Freq: Once | INTRAMUSCULAR | Status: AC
  Administered 2011-09-11: 6 mg via INTRAVENOUS
  Filled 2011-09-11: qty 2

## 2011-09-11 MED ORDER — IOHEXOL 300 MG/ML  SOLN: 20.0000 mL | INTRAMUSCULAR | Status: AC

## 2011-09-11 MED ORDER — SODIUM CHLORIDE 0.9 % IV SOLN
INTRAVENOUS | Status: DC
  Administered 2011-09-11: 14:00:00 via INTRAVENOUS

## 2011-09-11 MED ORDER — IOHEXOL 300 MG/ML  SOLN
100.0000 mL | Freq: Once | INTRAMUSCULAR | Status: AC | PRN
  Administered 2011-09-11: 100 mL via INTRAVENOUS

## 2011-09-11 MED ORDER — CIPROFLOXACIN HCL 500 MG PO TABS: 500.0000 mg | ORAL_TABLET | Freq: Two times a day (BID) | ORAL | Status: AC

## 2011-09-11 MED ORDER — MORPHINE SULFATE 4 MG/ML IJ SOLN
4.0000 mg | Freq: Once | INTRAMUSCULAR | Status: AC
  Administered 2011-09-11: 4 mg via INTRAVENOUS
  Filled 2011-09-11: qty 1

## 2011-09-11 MED ORDER — DEXTROSE 5 % IV SOLN
1.0000 g | Freq: Once | INTRAVENOUS | Status: AC
  Administered 2011-09-11: 1 g via INTRAVENOUS
  Filled 2011-09-11: qty 10

## 2011-09-11 NOTE — ED Notes (Signed)
Pt reports on Friday she thinks she ate a piece of [place] that had been sitting out too long. On Saturday pt began having abdominal pain, she states she had a large BM followed by a BM that was darkly red, bloody. Pt states pain along left side of abdomen. 9/10, sharp. No relieving or exacerbating factors. Pt resting in bed quietly watching TV with family at bedside in no active distress at present.

## 2011-09-11 NOTE — ED Provider Notes (Signed)
History     CSN: [id number]  Arrival date & time 09/11/11  1117   First MD Initiated Contact with Patient 09/11/11 1321      Chief Complaint  Patient presents with  . Diarrhea  . Rectal Bleeding    (Consider location/radiation/quality/duration/timing/severity/associated sxs/prior treatment) HPI  Past Medical History  Diagnosis Date  . Hypertension   . Migraine   . Diabetes mellitus   . Obesity   . Sleep apnea   . Carotid arterial disease   . Reflux   . Arthritis   . Anemia     History reviewed. No pertinent past surgical history.  History reviewed. No pertinent family history.  History  Substance Use Topics  . Smoking status: Never Smoker   . Smokeless tobacco: Not on file  . Alcohol Use: No    OB History    Grav Para Term Preterm Abortions TAB SAB Ect Mult Living                  Review of Systems  Allergies  Actos; Amoxil; and Metformin and related  Home Medications   Current Outpatient Rx  Name Route Sig Dispense Refill  . ASPIRIN EC 81 MG PO TBEC Oral Take 81 mg by mouth daily.      Marland Kitchen DILTIAZEM HCL ER BEADS 120 MG PO CP24 Oral Take 120 mg by mouth daily.      . ENALAPRIL MALEATE 5 MG PO TABS Oral Take 5 mg by mouth 2 (two) times daily.      Marland Kitchen EXENATIDE 5 MCG/0.02ML [place] SOLN Subcutaneous Inject 5 mcg into the skin 2 (two) times daily with a meal.      . EZETIMIBE 10 MG PO TABS Oral Take 10 mg by mouth every evening.      Marland Kitchen HYDROCHLOROTHIAZIDE 50 MG PO TABS Oral Take 50 mg by mouth daily.      . INSULIN GLARGINE 100 UNIT/ML  SOLN Subcutaneous Inject 65 Units into the skin every morning.      . OXYBUTYNIN CHLORIDE ER 10 MG PO TB24 Oral Take 10 mg by mouth daily.      Marland Kitchen PRAVASTATIN SODIUM 40 MG PO TABS Oral Take 80 mg by mouth daily.      Marland Kitchen CIPROFLOXACIN HCL 500 MG PO TABS Oral Take 1 tablet (500 mg total) by mouth 2 (two) times daily. 14 tablet 0    BP 124/91  Pulse 73  Temp(Src) 98.5 F (36.9 C) (Oral)  Resp 15  SpO2 99%  Physical  Exam  ED Course  Procedures (including critical care time)  Labs Reviewed  BASIC METABOLIC PANEL - Abnormal; Notable for the following:    GFR calc non Af Amer 49 (*)    GFR calc Af Amer 56 (*)    All other components within normal limits  CBC - Abnormal; Notable for the following:    Hemoglobin 11.9 (*)    All other components within normal limits  URINALYSIS, ROUTINE W REFLEX MICROSCOPIC - Abnormal; Notable for the following:    APPearance HAZY (*)    Hgb urine dipstick TRACE (*)    Nitrite POSITIVE (*)    Leukocytes, UA SMALL (*)    All other components within normal limits  URINE MICROSCOPIC-ADD ON - Abnormal; Notable for the following:    Bacteria, UA MANY (*)    All other components within normal limits  DIFFERENTIAL  OCCULT BLOOD, POC DEVICE  POCT OCCULT BLOOD STOOL, DEVICE  URINE CULTURE   Ct  Abdomen Pelvis W Contrast  09/11/2011  *RADIOLOGY REPORT*  Clinical Data: Left side abdominal pain.  Concern diverticulitis.  CT ABDOMEN AND PELVIS WITH CONTRAST  Technique:  Multidetector CT imaging of the abdomen and pelvis was performed following the standard protocol during bolus administration of intravenous contrast.  Contrast: 149mL OMNIPAQUE IOHEXOL 300 MG/ML IV SOLN  Comparison: None.  Findings: Lung bases are clear.  No pericardial fluid.  No focal hepatic lesion.  There is some artifact due to the patient's body habitus.  The gallbladder is not visualized.  The pancreas, spleen, and adrenal glands are normal.  There is a low attenuation cyst within the right kidney which appears benign.  No evidence of renal obstruction.  The stomach, small bowel, appendix, and cecum are normal.  No evidence of acute diverticulitis.  There is a laxity of the ventral abdominal and an umbilical hernia.  No evidence of bowel obstruction although bowel does extend into the laxity.  Abdominal aorta normal caliber.  No retroperitoneal or periportal lymphadenopathy.  No free fluid the pelvis.  The uterus  is lobular consistent with fibroids.  The bladder appears normal.  Coarse calcification is likely within the uterus.  No free fluid in the pelvis.  No pelvic lymphadenopathy.  Review of  bone windows demonstrates no aggressive osseous lesions.   IMPRESSION:  1.  No evidence of acute diverticulitis. 2.  Ventral abdominal wall hernia without evidence of obstruction. 3.  Fibroid uterus.  4.   Degenerative changes of the spine.  Original Report Authenticated By: Suzy Bouchard, M.D.     1. UTI (urinary tract infection)       MDM  Patient with CT abdomen pelvis that does not show any acute signs of diverticulitis.  Patient has benign exam at this time.  She had no blood on her rectal exam per Dr. Cathleen Fears.  Patient will be treated for urinary tract infection as an outpatient.  She may also have some musculoskeletal back pain given that she notes it's worse in the morning and is better after a hot shower.  Patient is also obese which would worsen musculoskeletal low back pain.  I've advised the patient to continue to followup with her primary care physician if her symptoms do not improve.        Lezlie Octave, MD 09/11/11 [phone]

## 2011-09-11 NOTE — ED Provider Notes (Addendum)
History     CSN: [id number]  Arrival date & time 09/11/11  1117   First MD Initiated Contact with Patient 09/11/11 1321      Chief Complaint  Patient presents with  . Diarrhea  . Rectal Bleeding    (Consider location/radiation/quality/duration/timing/severity/associated sxs/prior treatment) HPI Complains of left-sided abdominal pain and left flank pain for 2 days. Patient had one episode of vomiting 2 days ago and 2 episodes of diarrhea since onset of illness last episode of diarrhea was yesterday with approximately a tablespoon of blood mixed in. Patient presently hungry no fever no treatment prior to coming here pain is mild to moderate at present flank pain is worse with moving or twisting her torso improved with remaining still. Pain is constant Past Medical History  Diagnosis Date  . Hypertension   . Migraine   . Diabetes mellitus   . Obesity   . Sleep apnea   . Carotid arterial disease   . Reflux   . Arthritis   . Anemia     History reviewed. No pertinent past surgical history.  History reviewed. No pertinent family history.  History  Substance Use Topics  . Smoking status: Never Smoker   . Smokeless tobacco: Not on file  . Alcohol Use: No    OB History    Grav Para Term Preterm Abortions TAB SAB Ect Mult Living                  Review of Systems  Constitutional: Negative.   HENT: Negative.   Respiratory: Negative.   Cardiovascular: Negative.   Gastrointestinal: Positive for abdominal pain and blood in stool.  Genitourinary: Positive for flank pain.  Musculoskeletal: Negative.   Skin: Negative.   Neurological: Negative.   Hematological: Negative.   Psychiatric/Behavioral: Negative.     Allergies  Actos; Amoxil; and Metformin and related  Home Medications   Current Outpatient Rx  Name Route Sig Dispense Refill  . ASPIRIN EC 81 MG PO TBEC Oral Take 81 mg by mouth daily.      Marland Kitchen DILTIAZEM HCL ER BEADS 120 MG PO CP24 Oral Take 120 mg by mouth  daily.      . ENALAPRIL MALEATE 5 MG PO TABS Oral Take 5 mg by mouth 2 (two) times daily.      Marland Kitchen EXENATIDE 5 MCG/0.02ML [place] SOLN Subcutaneous Inject 5 mcg into the skin 2 (two) times daily with a meal.      . EZETIMIBE 10 MG PO TABS Oral Take 10 mg by mouth every evening.      Marland Kitchen HYDROCHLOROTHIAZIDE 50 MG PO TABS Oral Take 50 mg by mouth daily.      . INSULIN GLARGINE 100 UNIT/ML [place] SOLN Subcutaneous Inject 65 Units into the skin every morning.      . OXYBUTYNIN CHLORIDE ER 10 MG PO TB24 Oral Take 10 mg by mouth daily.      Marland Kitchen PRAVASTATIN SODIUM 40 MG PO TABS Oral Take 80 mg by mouth daily.        BP 119/66  Pulse 81  Temp(Src) 98.5 F (36.9 C) (Oral)  Resp 20  SpO2 97%  Physical Exam  Nursing note and vitals reviewed. Constitutional: She appears well-developed and well-nourished.  HENT:  Head: Normocephalic and atraumatic.  Eyes: Conjunctivae are normal. Pupils are equal, round, and reactive to light.  Neck: Neck supple. No tracheal deviation present. No thyromegaly present.  Cardiovascular: Normal rate and regular rhythm.   No murmur heard. Pulmonary/Chest: Effort normal  and breath sounds normal.  Abdominal: Soft. Bowel sounds are normal. She exhibits no distension. There is tenderness.       Morbidly obese; mildly tender at left upper quadrant and left lower cautery no guarding no rigidity  Genitourinary: Guaiac negative stool.       Normal tone nontender  Musculoskeletal: Normal range of motion. She exhibits no edema and no tenderness.  Neurological: She is alert. Coordination normal.  Skin: Skin is warm and dry. No rash noted.  Psychiatric: She has a normal mood and affect.    ED Course  Procedures (including critical care time)  Labs Reviewed  BASIC METABOLIC PANEL - Abnormal; Notable for the following:    GFR calc non Af Amer 49 (*)    GFR calc Af Amer 56 (*)    All other components within normal limits  CBC - Abnormal; Notable for the following:    Hemoglobin 11.9  (*)    All other components within normal limits  DIFFERENTIAL  OCCULT BLOOD, POC DEVICE  POCT OCCULT BLOOD STOOL, DEVICE  URINALYSIS, ROUTINE W REFLEX MICROSCOPIC   No results found.   No diagnosis found. 09/11/2011 Results for orders placed during the hospital encounter of [id number]  BASIC METABOLIC PANEL      Component Value Range   Sodium 140  135 - 145 (mEq/L)   Potassium 3.6  3.5 - 5.1 (mEq/L)   Chloride 99  96 - 112 (mEq/L)   CO2 32  19 - 32 (mEq/L)   Glucose, Bld 85  70 - 99 (mg/dL)   BUN 17  6 - 23 (mg/dL)   Creatinine, Ser 1.10  0.50 - 1.10 (mg/dL)   Calcium 9.7  8.4 - 10.5 (mg/dL)   GFR calc non Af Amer 49 (*) >90 (mL/min)   GFR calc Af Amer 56 (*) >90 (mL/min)  CBC      Component Value Range   WBC 6.8  4.0 - 10.5 (K/uL)   RBC 4.37  3.87 - 5.11 (MIL/uL)   Hemoglobin 11.9 (*) 12.0 - 15.0 (g/dL)   HCT 36.4  36.0 - 46.0 (%)   MCV 83.3  78.0 - 100.0 (fL)   MCH 27.2  26.0 - 34.0 (pg)   MCHC 32.7  30.0 - 36.0 (g/dL)   RDW 13.8  11.5 - 15.5 (%)   Platelets 254  150 - 400 (K/uL)  DIFFERENTIAL      Component Value Range   Neutrophils Relative 56  43 - 77 (%)   Neutro Abs 3.8  1.7 - 7.7 (K/uL)   Lymphocytes Relative 32  12 - 46 (%)   Lymphs Abs 2.2  0.7 - 4.0 (K/uL)   Monocytes Relative 9  3 - 12 (%)   Monocytes Absolute 0.6  0.1 - 1.0 (K/uL)   Eosinophils Relative 3  0 - 5 (%)   Eosinophils Absolute 0.2  0.0 - 0.7 (K/uL)   Basophils Relative 0  0 - 1 (%)   Basophils Absolute 0.0  0.0 - 0.1 (K/uL)  URINALYSIS, ROUTINE W REFLEX MICROSCOPIC      Component Value Range   Color, Urine YELLOW  YELLOW    APPearance HAZY (*) CLEAR    Specific Gravity, Urine 1.008  1.005 - 1.030    pH 6.0  5.0 - 8.0    Glucose, UA NEGATIVE  NEGATIVE (mg/dL)   Hgb urine dipstick TRACE (*) NEGATIVE    Bilirubin Urine NEGATIVE  NEGATIVE    Ketones, ur NEGATIVE  NEGATIVE (mg/dL)  Protein, ur NEGATIVE  NEGATIVE (mg/dL)   Urobilinogen, UA 0.2  0.0 - 1.0 (mg/dL)   Nitrite POSITIVE (*)  NEGATIVE    Leukocytes, UA SMALL (*) NEGATIVE   OCCULT BLOOD, POC DEVICE      Component Value Range   Fecal Occult Bld NEGATIVE    URINE MICROSCOPIC-ADD ON      Component Value Range   Squamous Epithelial / LPF RARE  RARE    WBC, UA 7-10  <3 (WBC/hpf)   RBC / HPF 0-2  <3 (RBC/hpf)   Bacteria, UA MANY (*) RARE    No results found.  5 p.m. abdominal pain has resolved complains of left hip pain additional morphine ordered. Patient signed out to Dr. Thad Ranger at 530 pm MDM  Plan urine for culture sensitivity CT scan pending  CT scan orderd as pt having flank pain, blood in stool and LUQ tenderness on exam Diagnosis abdominal #2 left flank pain #3 UTI        Orlie Dakin, MD 09/11/11 1746  Orlie Dakin, MD 09/12/11 [place], MD 10/08/11 1219

## 2011-09-11 NOTE — ED Notes (Signed)
Pt c/o blood in stool with nausea starting on Friday; pt c/o pain on left side; pt sts thinks she ate food on Friday

## 2011-09-15 NOTE — ED Notes (Signed)
+   urine Patient treated with Cipro-sensitive to same-Chart appended per protocol MD.

## 2011-10-13 ENCOUNTER — Other Ambulatory Visit: Payer: Self-pay

## 2011-10-13 ENCOUNTER — Emergency Department (HOSPITAL_COMMUNITY): Payer: Medicare Other

## 2011-10-13 ENCOUNTER — Encounter (HOSPITAL_COMMUNITY): Payer: Self-pay | Admitting: Emergency Medicine

## 2011-10-13 ENCOUNTER — Emergency Department (HOSPITAL_COMMUNITY)
Admission: EM | Admit: 2011-10-13 | Discharge: 2011-10-13 | Disposition: A | Discharge status: Home / Self Care | Payer: Medicare Other | Attending: Emergency Medicine | Admitting: Emergency Medicine

## 2011-10-13 DIAGNOSIS — Z794 Long term (current) use of insulin: Secondary | ICD-10-CM | POA: Insufficient documentation

## 2011-10-13 DIAGNOSIS — R0602 Shortness of breath: Secondary | ICD-10-CM | POA: Insufficient documentation

## 2011-10-13 DIAGNOSIS — R200 Anesthesia of skin: Secondary | ICD-10-CM

## 2011-10-13 DIAGNOSIS — M129 Arthropathy, unspecified: Secondary | ICD-10-CM | POA: Insufficient documentation

## 2011-10-13 DIAGNOSIS — I779 Disorder of arteries and arterioles, unspecified: Secondary | ICD-10-CM | POA: Insufficient documentation

## 2011-10-13 DIAGNOSIS — M79609 Pain in unspecified limb: Secondary | ICD-10-CM | POA: Insufficient documentation

## 2011-10-13 DIAGNOSIS — I10 Essential (primary) hypertension: Secondary | ICD-10-CM | POA: Insufficient documentation

## 2011-10-13 DIAGNOSIS — Z79899 Other long term (current) drug therapy: Secondary | ICD-10-CM | POA: Insufficient documentation

## 2011-10-13 DIAGNOSIS — E119 Type 2 diabetes mellitus without complications: Secondary | ICD-10-CM | POA: Insufficient documentation

## 2011-10-13 DIAGNOSIS — R079 Chest pain, unspecified: Secondary | ICD-10-CM | POA: Insufficient documentation

## 2011-10-13 DIAGNOSIS — R209 Unspecified disturbances of skin sensation: Secondary | ICD-10-CM | POA: Insufficient documentation

## 2011-10-13 DIAGNOSIS — R29898 Other symptoms and signs involving the musculoskeletal system: Secondary | ICD-10-CM | POA: Insufficient documentation

## 2011-10-13 DIAGNOSIS — Z7982 Long term (current) use of aspirin: Secondary | ICD-10-CM | POA: Insufficient documentation

## 2011-10-13 LAB — BASIC METABOLIC PANEL
BUN: 16 mg/dL (ref 6–23)
Calcium: 9.9 mg/dL (ref 8.4–10.5)
Creatinine, Ser: 0.99 mg/dL (ref 0.50–1.10)
GFR calc Af Amer: 64 mL/min — ABNORMAL LOW (ref >90)
GFR calc non Af Amer: 55 mL/min — ABNORMAL LOW (ref >90)

## 2011-10-13 LAB — TROPONIN I: Troponin I: <0.3 ng/mL (ref <0.30)

## 2011-10-13 LAB — DIFFERENTIAL
Basophils Relative: 0 % (ref 0–1)
Eosinophils Absolute: 0.2 10*3/uL (ref 0.0–0.7)
Monocytes Absolute: 0.5 10*3/uL (ref 0.1–1.0)
Monocytes Relative: 8 % (ref 3–12)

## 2011-10-13 LAB — CBC
MCV: 83.6 fL (ref 78.0–100.0)
RBC: 4.58 MIL/uL (ref 3.87–5.11)
WBC: 6.4 10*3/uL (ref 4.0–10.5)

## 2011-10-13 NOTE — ED Notes (Signed)
Family at bedside. 

## 2011-10-13 NOTE — ED Provider Notes (Signed)
History     CSN: [id number]  Arrival date & time 10/13/11  1201   First MD Initiated Contact with Patient 10/13/11 1216      Chief Complaint  Patient presents with  . Chest Pain  . Arm Pain    (Consider location/radiation/quality/duration/timing/severity/associated sxs/prior treatment) Patient is a 76 y.o. female presenting with chest pain and arm pain. The history is provided by the patient.  Chest Pain    Arm Pain Associated symptoms include chest pain.  For last 2 months, the patient has been having intermittent tingling in her left arm which will sometimes radiate up to her left shoulder, left side of her neck, and occasionally, the left side of her chest. The numbness is usually worse at night when she lays down, and will stay with her on light. It seems to be less during the day when she is up and around and walking. She states she has been under a lot of stress. She denies any dyspnea, nausea, vomiting, diaphoresis. She's not had any chest heaviness, tightness, pressure, or pain. She denies any weakness in her arm. She denies any symptoms in her abdomen or legs. Denies any bowel or bladder dysfunction. He has not done anything to try and improve the symptoms. She called her doctor's office today who advised her to come to the emergency department. She describes the symptoms as moderate to severe.  Past Medical History  Diagnosis Date  . Hypertension   . Migraine   . Diabetes mellitus   . Obesity   . Sleep apnea   . Carotid arterial disease   . Reflux   . Arthritis   . Anemia     History reviewed. No pertinent past surgical history.  History reviewed. No pertinent family history.  History  Substance Use Topics  . Smoking status: Never Smoker   . Smokeless tobacco: Not on file  . Alcohol Use: No    OB History    Grav Para Term Preterm Abortions TAB SAB Ect Mult Living                  Review of Systems  Cardiovascular: Positive for chest pain.  All other  systems reviewed and are negative.    Allergies  Amoxil  Home Medications   Current Outpatient Rx  Name Route Sig Dispense Refill  . ASPIRIN EC 81 MG PO TBEC Oral Take 81 mg by mouth daily.      Marland Kitchen DILTIAZEM HCL ER BEADS 120 MG PO CP24 Oral Take 120 mg by mouth daily.      . ENALAPRIL MALEATE 5 MG PO TABS Oral Take 5 mg by mouth 2 (two) times daily.      Marland Kitchen EXENATIDE 5 MCG/0.02ML [place] SOLN Subcutaneous Inject 5 mcg into the skin 2 (two) times daily with a meal.     . EZETIMIBE 10 MG PO TABS Oral Take 10 mg by mouth every evening.      Marland Kitchen HYDROCHLOROTHIAZIDE 50 MG PO TABS Oral Take 50 mg by mouth daily.      . INSULIN GLARGINE 100 UNIT/ML [place] SOLN Subcutaneous Inject 74 Units into the skin every morning.     . OXYBUTYNIN CHLORIDE ER 10 MG PO TB24 Oral Take 10 mg by mouth daily.      Marland Kitchen PRAVASTATIN SODIUM 40 MG PO TABS Oral Take 80 mg by mouth daily.        There were no vitals taken for this visit.  Physical Exam  Nursing note  and vitals reviewed. 76 year old female who is resting comfortably and in no acute distress. Vital signs are normal. Oxygen saturation is 99% which is normal. Head is normocephalic and atraumatic. PERRLA, EOMI. Oropharynx is clear. Neck is supple without adenopathy, JVD, or bruit. Back is nontender. Lungs are clear without rales, wheezes, or rhonchi. Heart has regular rate and rhythm without murmur. Abdomen is obese, soft, nontender without masses or hepatosplenomegaly and peristalsis is present. Extremities have trace edema no cyanosis. Skin is warm and dry without rash. Neurologic: Mental status is normal, cranial nerves are intact, there no focal motor or sensory deficits. Psychiatric: No abnormalities of mood or affect.  ED Course  Procedures (including critical care time)  Results for orders placed during the hospital encounter of 10/13/11  CBC      Component Value Range   WBC 6.4  4.0 - 10.5 (K/uL)   RBC 4.58  3.87 - 5.11 (MIL/uL)   Hemoglobin 11.7 (*) 12.0 -  15.0 (g/dL)   HCT 38.3  36.0 - 46.0 (%)   MCV 83.6  78.0 - 100.0 (fL)   MCH 25.5 (*) 26.0 - 34.0 (pg)   MCHC 30.5  30.0 - 36.0 (g/dL)   RDW 14.0  11.5 - 15.5 (%)   Platelets 240  150 - 400 (K/uL)  DIFFERENTIAL      Component Value Range   Neutrophils Relative 58  43 - 77 (%)   Neutro Abs 3.7  1.7 - 7.7 (K/uL)   Lymphocytes Relative 31  12 - 46 (%)   Lymphs Abs 2.0  0.7 - 4.0 (K/uL)   Monocytes Relative 8  3 - 12 (%)   Monocytes Absolute 0.5  0.1 - 1.0 (K/uL)   Eosinophils Relative 3  0 - 5 (%)   Eosinophils Absolute 0.2  0.0 - 0.7 (K/uL)   Basophils Relative 0  0 - 1 (%)   Basophils Absolute 0.0  0.0 - 0.1 (K/uL)  BASIC METABOLIC PANEL      Component Value Range   Sodium 137  135 - 145 (mEq/L)   Potassium 3.7  3.5 - 5.1 (mEq/L)   Chloride 97  96 - 112 (mEq/L)   CO2 31  19 - 32 (mEq/L)   Glucose, Bld 95  70 - 99 (mg/dL)   BUN 16  6 - 23 (mg/dL)   Creatinine, Ser 0.99  0.50 - 1.10 (mg/dL)   Calcium 9.9  8.4 - 10.5 (mg/dL)   GFR calc non Af Amer 55 (*) >90 (mL/min)   GFR calc Af Amer 64 (*) >90 (mL/min)  TROPONIN I      Component Value Range   Troponin I <0.30  <0.30 (ng/mL)   Dg Chest 2 View  10/13/2011  *RADIOLOGY REPORT*  Clinical Data: Numbness, weakness of the left arm, shortness of breath  CHEST - 2 VIEW  Comparison: Chest x-ray of 12/18/2003  Findings: No active infiltrate or effusion is seen.  Mild cardiomegaly is stable.  There are degenerative changes throughout the thoracic spine.  IMPRESSION: Stable cardiomegaly.  No active lung disease.  Original Report Authenticated By: Joretta Bachelor, M.D.   Ct Cervical Spine Wo Contrast  10/13/2011  *RADIOLOGY REPORT*  Clinical Data: Intermittent left arm pain.  Chest pain  CT CERVICAL SPINE WITHOUT CONTRAST  Technique:  Multidetector CT imaging of the cervical spine was performed. Multiplanar CT image reconstructions were also generated.  Comparison: None.  Findings: Image quality degraded by large patient size.  Normal alignment and  no fracture.  Central disc protrusion and spurring and at C4-5.  Disc degeneration and spondylosis C5-6 and C6-7.  Mild facet degeneration at  C5-6 and C6-7.  Calcifications  within the left submandibular gland not causing obstruction.  These are relatively large.  The larger calculus measures 8 x 4 mm and the smaller calculus measures 3 x 7 mm.  IMPRESSION: Cervical degenerative changes without significant spinal stenosis or foraminal encroachment.  No acute bony abnormality.  Left submandibular gland calculi.  Original Report Authenticated By: Truett Perna, M.D.       Date: 10/13/2011  Rate: 82  Rhythm: normal sinus rhythm  QRS Axis: right  Intervals: normal  ST/T Wave abnormalities: normal  Conduction Disutrbances:none  Narrative Interpretation: Low voltage QRS with borderline right axis deviation. When compared with ECG of 12/18/2003, axis has shifted rightward.  Old EKG Reviewed: unchanged    1. Arm numbness       MDM  Intermittent numbness of the left arm which I feel is most likely due to a cervical radiculopathy. Pain is markedly atypical for cardiac, but the cardiac troponin will be checked.        Delora Fuel, MD [phone] [id number]

## 2011-10-13 NOTE — ED Notes (Signed)
Labs obtained

## 2011-10-13 NOTE — ED Notes (Addendum)
Pt c/o intermittent left arm pain into neck and midsternal CP that became more constant over last couple of days but pt states she has had this pain  x 2 months; pt denies new SOB or nausea pt states she has been under a lot of stress,husband and daughter have been ill.

## 2011-10-13 NOTE — ED Notes (Signed)
Patient transferred to CT by LL.

## 2011-10-26 ENCOUNTER — Ambulatory Visit: Payer: Medicare Other | Attending: Family Medicine | Admitting: Rehabilitation

## 2011-10-26 DIAGNOSIS — IMO0001 Reserved for inherently not codable concepts without codable children: Secondary | ICD-10-CM | POA: Insufficient documentation

## 2011-10-26 DIAGNOSIS — M2560 Stiffness of unspecified joint, not elsewhere classified: Secondary | ICD-10-CM | POA: Insufficient documentation

## 2011-10-26 DIAGNOSIS — M2550 Pain in unspecified joint: Secondary | ICD-10-CM | POA: Insufficient documentation

## 2011-10-26 DIAGNOSIS — R293 Abnormal posture: Secondary | ICD-10-CM | POA: Insufficient documentation

## 2011-10-31 ENCOUNTER — Encounter: Payer: PRIVATE HEALTH INSURANCE | Admitting: Rehabilitation

## 2011-11-03 ENCOUNTER — Ambulatory Visit: Payer: Medicare Other | Attending: Family Medicine | Admitting: Rehabilitation

## 2011-11-03 DIAGNOSIS — R293 Abnormal posture: Secondary | ICD-10-CM | POA: Insufficient documentation

## 2011-11-03 DIAGNOSIS — M2560 Stiffness of unspecified joint, not elsewhere classified: Secondary | ICD-10-CM | POA: Insufficient documentation

## 2011-11-03 DIAGNOSIS — IMO0001 Reserved for inherently not codable concepts without codable children: Secondary | ICD-10-CM | POA: Insufficient documentation

## 2011-11-03 DIAGNOSIS — M2550 Pain in unspecified joint: Secondary | ICD-10-CM | POA: Insufficient documentation

## 2011-11-07 ENCOUNTER — Encounter: Payer: PRIVATE HEALTH INSURANCE | Admitting: Rehabilitation

## 2011-11-07 ENCOUNTER — Ambulatory Visit: Payer: Medicare Other | Admitting: Rehabilitation

## 2011-11-09 ENCOUNTER — Ambulatory Visit: Payer: Medicare Other | Admitting: Rehabilitation

## 2011-11-14 ENCOUNTER — Ambulatory Visit: Payer: Medicare Other | Admitting: Rehabilitation

## 2011-11-16 ENCOUNTER — Ambulatory Visit: Payer: Medicare Other | Admitting: Rehabilitation

## 2011-11-21 ENCOUNTER — Encounter: Payer: PRIVATE HEALTH INSURANCE | Admitting: Rehabilitation

## 2011-11-23 ENCOUNTER — Encounter: Payer: PRIVATE HEALTH INSURANCE | Admitting: Rehabilitation

## 2012-01-16 ENCOUNTER — Other Ambulatory Visit: Payer: Self-pay | Admitting: Family Medicine

## 2012-01-16 ENCOUNTER — Other Ambulatory Visit (HOSPITAL_COMMUNITY)
Admission: RE | Admit: 2012-01-16 | Discharge: 2012-01-16 | Disposition: A | Discharge status: Home / Self Care | Payer: Medicare Other | Source: Ambulatory Visit | Attending: Family Medicine | Admitting: Family Medicine

## 2012-01-16 DIAGNOSIS — Z124 Encounter for screening for malignant neoplasm of cervix: Secondary | ICD-10-CM | POA: Insufficient documentation

## 2012-08-27 ENCOUNTER — Ambulatory Visit (INDEPENDENT_AMBULATORY_CARE_PROVIDER_SITE_OTHER): Payer: Medicare Other | Admitting: General Surgery

## 2013-01-01 ENCOUNTER — Ambulatory Visit (INDEPENDENT_AMBULATORY_CARE_PROVIDER_SITE_OTHER): Payer: Medicare Other | Admitting: General Surgery

## 2013-01-01 ENCOUNTER — Telehealth (INDEPENDENT_AMBULATORY_CARE_PROVIDER_SITE_OTHER): Payer: Self-pay

## 2013-01-01 ENCOUNTER — Encounter (INDEPENDENT_AMBULATORY_CARE_PROVIDER_SITE_OTHER): Payer: Self-pay | Admitting: General Surgery

## 2013-01-01 VITALS — BP 126/84 | HR 72 | Temp 96.8°F | Resp 20 | Ht 63.0 in | Wt 352.0 lb

## 2013-01-01 DIAGNOSIS — IMO0002 Reserved for concepts with insufficient information to code with codable children: Secondary | ICD-10-CM

## 2013-01-01 DIAGNOSIS — L02414 Cutaneous abscess of left upper limb: Secondary | ICD-10-CM

## 2013-01-01 MED ORDER — DOXYCYCLINE HYCLATE 50 MG PO CAPS: 100.0000 mg | ORAL_CAPSULE | Freq: Two times a day (BID) | ORAL | Status: DC

## 2013-01-01 NOTE — Progress Notes (Signed)
Patient ID: Jeanne [name], female   DOB: [birth year], 77 y.o.   MRN: [id number]  Chief Complaint  Patient presents with  . Cyst    left elbow    HPI Jeanne [name] is a 77 y.o. female.  Chief complaint: Left arm mass at the elbow HPIPatient complains a 5 year history of a mass in her left arm. It was stable in size and followed by her primary care physician. More recently, became more painful and got bigger. 2 days ago it spontaneously drained some purulent fluid. She presents for evaluation. No significant exacerbation of pain with movement of her elbow.  Past Medical History  Diagnosis Date  . Hypertension   . Migraine   . Diabetes mellitus   . Obesity   . Sleep apnea   . Carotid arterial disease   . Reflux   . Arthritis   . Anemia   . Hyperlipidemia     Past Surgical History  Procedure Laterality Date  . Joint replacement Right 2005    knee  . Cholecystectomy  50 yrs ago    No family history on file.  Social History History  Substance Use Topics  . Smoking status: Never Smoker   . Smokeless tobacco: Not on file  . Alcohol Use: No    Allergies  Allergen Reactions  . Amoxicillin Nausea Only    Current Outpatient Prescriptions  Medication Sig Dispense Refill  . aspirin EC 81 MG tablet Take 81 mg by mouth daily.        Marland Kitchen diltiazem (TIAZAC) 120 MG 24 hr capsule Take 120 mg by mouth daily.        . enalapril (VASOTEC) 5 MG tablet Take 5 mg by mouth 2 (two) times daily.        Marland Kitchen exenatide (BYETTA) 5 MCG/0.02ML SOLN Inject 5 mcg into the skin 2 (two) times daily with a meal.       . ezetimibe (ZETIA) 10 MG tablet Take 10 mg by mouth every evening.        . hydrochlorothiazide (HYDRODIURIL) 50 MG tablet Take 50 mg by mouth daily.        . insulin glargine (LANTUS) 100 UNIT/ML injection Inject 74 Units into the skin every morning.       Marland Kitchen oxybutynin (DITROPAN-XL) 10 MG 24 hr tablet Take 10 mg by mouth daily.        . pravastatin (PRAVACHOL) 40 MG tablet Take 80  mg by mouth daily.        Marland Kitchen doxycycline (VIBRAMYCIN) 50 MG capsule Take 2 capsules (100 mg total) by mouth 2 (two) times daily.  28 capsule  0   No current facility-administered medications for this visit.    Review of Systems Review of Systems  Constitutional: Negative for fever, chills and unexpected weight change.  HENT: Negative for hearing loss, congestion, sore throat, trouble swallowing and voice change.   Eyes: Negative for visual disturbance.  Respiratory: Negative for cough and wheezing.   Cardiovascular: Negative for chest pain, palpitations and leg swelling.  Gastrointestinal: Negative for nausea, vomiting, abdominal pain, diarrhea, constipation, blood in stool, abdominal distention and anal bleeding.  Genitourinary: Negative for hematuria, vaginal bleeding and difficulty urinating.  Musculoskeletal: Negative for arthralgias.       See history of present illness  Skin: Negative for rash and wound.  Neurological: Negative for seizures, syncope and headaches.  Hematological: Negative for adenopathy. Does not bruise/bleed easily.  Psychiatric/Behavioral: Negative for confusion.    Blood  pressure 126/84, pulse 72, temperature 96.8 F (36 C), temperature source Oral, resp. rate 20, height 5\' 3"  (1.6 m), weight 352 lb (159.666 kg).  Physical Exam Physical Exam  Constitutional: She is oriented to person, place, and time. She appears well-developed and well-nourished. No distress.  HENT:  Head: Normocephalic and atraumatic.  Eyes: EOM are normal. Pupils are equal, round, and reactive to light.  Neck: Normal range of motion. No tracheal deviation present.  Cardiovascular: Normal rate, regular rhythm, normal heart sounds and intact distal pulses.   Mild peripheral edema  Pulmonary/Chest: Effort normal. No stridor. No respiratory distress. She has no wheezes. She has rales.  Abdominal: Soft. She exhibits no distension. There is no tenderness.  Musculoskeletal:       Arms: 3 x  4 cm abscess right elbow with 2 draining sinuses and purulent fluid  Neurological: She is alert and oriented to person, place, and time.   Procedure note: Area was prepped in sterile fashion. Local was injected. Elliptical incision was made over this elbow abscess to encompass both of the draining sinuses.. Fluid was evacuated and sent for culture. This ellipse of tissue was removed sharply. Wound was irrigated. Does not appear to involve the joint at all. Hemostasis was obtained with pressure. Wound was packed with quarter-inch form gauze and a bulky gauze dressing was applied. She tolerated this well.  Assessment    Left arm abscess near the elbow    Plan    Incision and drainage as above, daily home health nursing form packing change, doxycycline for 14 day course, return next week, follow up cultures.       Cornie Herrington E 01/01/2013, 10:35 AM

## 2013-01-01 NOTE — Telephone Encounter (Signed)
I called in Hydrocodone 5/325 1 po q 6 hrs prn pain # 30 no refill to [facility] on [address].

## 2013-01-02 ENCOUNTER — Telehealth (INDEPENDENT_AMBULATORY_CARE_PROVIDER_SITE_OTHER): Payer: Self-pay | Admitting: General Surgery

## 2013-01-02 NOTE — Telephone Encounter (Signed)
Spoke with Helene Kelp at [facility]  On 01/02/13 @ 3:56 pm  She told me someone would be out to see the above patient on 01/03/13 in am . I called the patient to let her know [place] would be out 01/03/13  as she has called to office asking why no one has been out to see he yet .

## 2013-01-04 LAB — WOUND CULTURE: Gram Stain: NONE SEEN

## 2013-01-06 ENCOUNTER — Telehealth (INDEPENDENT_AMBULATORY_CARE_PROVIDER_SITE_OTHER): Payer: Self-pay

## 2013-01-06 NOTE — Telephone Encounter (Signed)
Patient called to report she never rec'd her anitbiotic.  Patient states she called her pharmacy ([facility] on [address] [phone]) and they did not have a prescription for Doxycycline.  EPIC shows the prescription was printed but, patient states she did not receive any prescription during her office visit on 01/01/13.  Called Doxycycline 50 mg to [facility] on [address]. Patient aware and will pick up medication.

## 2013-01-08 ENCOUNTER — Ambulatory Visit (INDEPENDENT_AMBULATORY_CARE_PROVIDER_SITE_OTHER): Payer: Medicare Other | Admitting: General Surgery

## 2013-01-08 ENCOUNTER — Encounter (INDEPENDENT_AMBULATORY_CARE_PROVIDER_SITE_OTHER): Payer: Self-pay | Admitting: General Surgery

## 2013-01-08 VITALS — BP 140/82 | HR 76 | Temp 97.4°F | Resp 20 | Ht 62.0 in | Wt 350.0 lb

## 2013-01-08 DIAGNOSIS — IMO0002 Reserved for concepts with insufficient information to code with codable children: Secondary | ICD-10-CM

## 2013-01-08 DIAGNOSIS — L02414 Cutaneous abscess of left upper limb: Secondary | ICD-10-CM

## 2013-01-08 NOTE — Patient Instructions (Signed)
Continue daily wound care

## 2013-01-08 NOTE — Progress Notes (Signed)
Subjective:     Patient ID: Jeanne [name], female   DOB: 1936-02-07, 77 y.o.   MRN: [id number]  HPI  Patient is a followup status post incision and drainage of left elbow abscess. Home health nursing is assisting her with daily iodoform dressing changes with packing. Her family has also been alternating days. She is feeling much better. Review of Systems     Objective:   Physical Exam Wound base is clean with clean granulation tissue and no evidence of ongoing abscess or undrained collection, minimal serous drainage, wound repacked with quarter-inch iodoform covered by gauze.    Assessment:     Improving status post I&D of left elbow abscess    Plan:     Continue wound care, return in 2 weeks

## 2013-01-29 ENCOUNTER — Encounter (INDEPENDENT_AMBULATORY_CARE_PROVIDER_SITE_OTHER): Payer: Medicare Other | Admitting: General Surgery

## 2013-02-02 ENCOUNTER — Emergency Department (HOSPITAL_COMMUNITY)
Admission: EM | Admit: 2013-02-02 | Discharge: 2013-02-02 | Disposition: A | Discharge status: Home / Self Care | Payer: Medicare Other | Attending: Emergency Medicine | Admitting: Emergency Medicine

## 2013-02-02 ENCOUNTER — Emergency Department (HOSPITAL_COMMUNITY): Payer: Medicare Other

## 2013-02-02 ENCOUNTER — Encounter (HOSPITAL_COMMUNITY): Payer: Self-pay | Admitting: Emergency Medicine

## 2013-02-02 DIAGNOSIS — M7918 Myalgia, other site: Secondary | ICD-10-CM

## 2013-02-02 DIAGNOSIS — I10 Essential (primary) hypertension: Secondary | ICD-10-CM | POA: Insufficient documentation

## 2013-02-02 DIAGNOSIS — Z8739 Personal history of other diseases of the musculoskeletal system and connective tissue: Secondary | ICD-10-CM | POA: Insufficient documentation

## 2013-02-02 DIAGNOSIS — Z862 Personal history of diseases of the blood and blood-forming organs and certain disorders involving the immune mechanism: Secondary | ICD-10-CM | POA: Insufficient documentation

## 2013-02-02 DIAGNOSIS — R42 Dizziness and giddiness: Secondary | ICD-10-CM | POA: Insufficient documentation

## 2013-02-02 DIAGNOSIS — Z79899 Other long term (current) drug therapy: Secondary | ICD-10-CM | POA: Insufficient documentation

## 2013-02-02 DIAGNOSIS — R110 Nausea: Secondary | ICD-10-CM | POA: Insufficient documentation

## 2013-02-02 DIAGNOSIS — E785 Hyperlipidemia, unspecified: Secondary | ICD-10-CM | POA: Insufficient documentation

## 2013-02-02 DIAGNOSIS — Z8679 Personal history of other diseases of the circulatory system: Secondary | ICD-10-CM | POA: Insufficient documentation

## 2013-02-02 DIAGNOSIS — Z794 Long term (current) use of insulin: Secondary | ICD-10-CM | POA: Insufficient documentation

## 2013-02-02 DIAGNOSIS — Z8669 Personal history of other diseases of the nervous system and sense organs: Secondary | ICD-10-CM | POA: Insufficient documentation

## 2013-02-02 DIAGNOSIS — Z7982 Long term (current) use of aspirin: Secondary | ICD-10-CM | POA: Insufficient documentation

## 2013-02-02 DIAGNOSIS — Z8719 Personal history of other diseases of the digestive system: Secondary | ICD-10-CM | POA: Insufficient documentation

## 2013-02-02 DIAGNOSIS — E119 Type 2 diabetes mellitus without complications: Secondary | ICD-10-CM | POA: Insufficient documentation

## 2013-02-02 DIAGNOSIS — M25559 Pain in unspecified hip: Secondary | ICD-10-CM | POA: Insufficient documentation

## 2013-02-02 LAB — POCT I-STAT, CHEM 8
Chloride: 100 mEq/L (ref 96–112)
Glucose, Bld: 147 mg/dL — ABNORMAL HIGH (ref 70–99)
HCT: 37 % (ref 36.0–46.0)
Hemoglobin: 12.6 g/dL (ref 12.0–15.0)
Potassium: 4.2 mEq/L (ref 3.5–5.1)
Sodium: 139 mEq/L (ref 135–145)

## 2013-02-02 MED ORDER — HYDROCODONE-ACETAMINOPHEN 5-325 MG PO TABS
1.0000 | ORAL_TABLET | Freq: Once | ORAL | Status: AC
  Administered 2013-02-02: 1 via ORAL
  Filled 2013-02-02: qty 1

## 2013-02-02 MED ORDER — ONDANSETRON 4 MG PO TBDP
8.0000 mg | ORAL_TABLET | Freq: Once | ORAL | Status: AC
  Administered 2013-02-02: 8 mg via ORAL
  Filled 2013-02-02: qty 2

## 2013-02-02 NOTE — ED Notes (Signed)
Pt. Stated, rt. hiop pain for a week.  Also a little bit of nausea and dizziness.

## 2013-02-02 NOTE — ED Provider Notes (Signed)
History     CSN: [id number]  Arrival date & time 02/02/13  [id number]   First MD Initiated Contact with Patient 02/02/13 0827      Chief Complaint  Patient presents with  . Hip Pain    (Consider location/radiation/quality/duration/timing/severity/associated sxs/prior treatment) HPI Complains of pain at right buttock onset 2 weeks ago. Pain is worse with pressure on the area improved with remaining still. Patient also complains of dizziness i.e. Vertigo sensation of room spinning when she moves her head accompanied by nausea onset 2 weeks ago. She denies headache denies visual changes denies other complaint. She states the dizziness has improved with time. Denies other associated symptoms. Denies fever denies trauma or fall.she's been treated with by biofreeze Topical ointmentlast night with significant relief. Also has taken ibuprofen without relief. No fever . no other associated symptoms Past Medical History  Diagnosis Date  . Hypertension   . Migraine   . Diabetes mellitus   . Obesity   . Sleep apnea   . Carotid arterial disease   . Reflux   . Arthritis   . Anemia   . Hyperlipidemia     Past Surgical History  Procedure Laterality Date  . Joint replacement Right 2005    knee  . Cholecystectomy  50 yrs ago    No family history on file.  History  Substance Use Topics  . Smoking status: Never Smoker   . Smokeless tobacco: Never Used  . Alcohol Use: No    OB History   Grav Para Term Preterm Abortions TAB SAB Ect Mult Living                  Review of Systems  Gastrointestinal: Positive for nausea.  Musculoskeletal:       Pain in right buttock  Neurological:       Vertigo    Allergies  Amoxicillin  Home Medications   Current Outpatient Rx  Name  Route  Sig  Dispense  Refill  . aspirin EC 81 MG tablet   Oral   Take 81 mg by mouth daily.           . BD INSULIN SYRINGE ULTRAFINE 31G X 5/16" 1 ML MISC               . diltiazem (TIAZAC) 120 MG 24 hr  capsule   Oral   Take 120 mg by mouth daily.           Marland Kitchen doxycycline (VIBRAMYCIN) 50 MG capsule   Oral   Take 2 capsules (100 mg total) by mouth 2 (two) times daily.   28 capsule   0   . enalapril (VASOTEC) 5 MG tablet   Oral   Take 5 mg by mouth 2 (two) times daily.           Marland Kitchen exenatide (BYETTA) 5 MCG/0.02ML SOLN   Subcutaneous   Inject 5 mcg into the skin 2 (two) times daily with a meal.          . ezetimibe (ZETIA) 10 MG tablet   Oral   Take 10 mg by mouth every evening.           . hydrochlorothiazide (HYDRODIURIL) 50 MG tablet   Oral   Take 50 mg by mouth daily.           Marland Kitchen HYDROcodone-acetaminophen (NORCO/VICODIN) 5-325 MG per tablet               . insulin glargine (LANTUS) 100 UNIT/ML  injection   Subcutaneous   Inject 74 Units into the skin every morning.          . INVOKANA 100 MG TABS               . oxybutynin (DITROPAN-XL) 10 MG 24 hr tablet   Oral   Take 10 mg by mouth daily.           . pravastatin (PRAVACHOL) 40 MG tablet   Oral   Take 80 mg by mouth daily.             BP 160/66  Pulse 60  Temp(Src) 97.8 F (36.6 C) (Oral)  Resp 17  SpO2 100%  Physical Exam  Nursing note and vitals reviewed. Constitutional: She is oriented to person, place, and time. She appears well-developed and well-nourished.  HENT:  Head: Normocephalic and atraumatic.  Eyes: Conjunctivae are normal. Pupils are equal, round, and reactive to light.  Neck: Neck supple. No tracheal deviation present. No thyromegaly present.  Cardiovascular: Normal rate and regular rhythm.   No murmur heard. Pulmonary/Chest: Effort normal and breath sounds normal.  Abdominal: Soft. Bowel sounds are normal. She exhibits no distension. There is no tenderness.  Morbidly obese  Musculoskeletal: Normal range of motion. She exhibits no edema and no tenderness.  Entire spine nontender. She has no hip tenderness no pain on internal rotation of either thigh. She has pain  at right buttock upon getting up to walk. She has no point tenderness at pelvis.Linus Mako with minimal assistance.  Neurological: She is alert and oriented to person, place, and time. No cranial nerve deficit. Coordination normal.  Skin: Skin is warm and dry. No rash noted.  Psychiatric: She has a normal mood and affect.    ED Course  Procedures (including critical care time)  Labs Reviewed - No data to display No results found.  X-ray reviewed by me Results for orders placed during the hospital encounter of 02/02/13  POCT I-STAT, CHEM 8      Result Value Range   Sodium 139  135 - 145 mEq/L   Potassium 4.2  3.5 - 5.1 mEq/L   Chloride 100  96 - 112 mEq/L   BUN 25 (*) 6 - 23 mg/dL   Creatinine, Ser 1.00  0.50 - 1.10 mg/dL   Glucose, Bld 147 (*) 70 - 99 mg/dL   Calcium, Ion 1.22  1.13 - 1.30 mmol/L   TCO2 32  0 - 100 mmol/L   Hemoglobin 12.6  12.0 - 15.0 g/dL   HCT 37.0  36.0 - 46.0 %   Dg Pelvis 1-2 Views  02/02/2013   *RADIOLOGY REPORT*  Clinical Data: Right-sided pain posteriorly.  No trauma.  PELVIS - 1-2 VIEW  Comparison: CT of 09/11/2011  Findings: L4-L5 degenerative disc disease. No acute fracture or dislocation.  Mild right hip osteoarthritis.  A phlebolith in the left hemi pelvis. Large colonic stool burden.  IMPRESSION: Right hip osteoarthritis, mild.  Spondylosis.  Possible constipation.   Original Report Authenticated By: Abigail Miyamoto, M.D.    No diagnosis found. 11 AM patient resting comfortably after treatment with Zofran and Norco.   MDM  I don't feel that patient should have prescription for by mouth pain medicine she is prone to falling.she does have a walker at home which she is encouraged to use. She has recently been prescribed meclizine by Dr. Sheryn Bison. I feel that pain right hisbuttock is muscular. She has incidental arthritis on her x-ray of her pelvis. She  can use Tylenol for pain. biofreeze as directed. Meclizine 50 mg 3 times daily for vertigo. Diagnosis #1  pain in right buttock #2 vertigo #3 hyperglycemia       as  Orlie Dakin, MD 02/02/13 1108

## 2013-04-18 ENCOUNTER — Other Ambulatory Visit: Payer: Self-pay | Admitting: Family Medicine

## 2013-04-18 DIAGNOSIS — R42 Dizziness and giddiness: Secondary | ICD-10-CM

## 2013-05-06 ENCOUNTER — Ambulatory Visit
Admission: RE | Admit: 2013-05-06 | Discharge: 2013-05-06 | Disposition: A | Discharge status: Home / Self Care | Payer: PRIVATE HEALTH INSURANCE | Source: Ambulatory Visit | Attending: Family Medicine | Admitting: Family Medicine

## 2013-05-06 DIAGNOSIS — R42 Dizziness and giddiness: Secondary | ICD-10-CM

## 2014-05-01 ENCOUNTER — Ambulatory Visit
Admission: RE | Admit: 2014-05-01 | Discharge: 2014-05-01 | Disposition: A | Discharge status: Home / Self Care | Payer: Medicare Other | Source: Ambulatory Visit | Attending: Family Medicine | Admitting: Family Medicine

## 2014-05-01 ENCOUNTER — Other Ambulatory Visit: Payer: Self-pay | Admitting: Family Medicine

## 2014-05-01 DIAGNOSIS — R430 Anosmia: Secondary | ICD-10-CM

## 2014-05-20 ENCOUNTER — Other Ambulatory Visit: Payer: Self-pay | Admitting: Otolaryngology

## 2014-05-20 DIAGNOSIS — R430 Anosmia: Secondary | ICD-10-CM

## 2014-06-02 ENCOUNTER — Ambulatory Visit
Admission: RE | Admit: 2014-06-02 | Discharge: 2014-06-02 | Disposition: A | Discharge status: Home / Self Care | Payer: Medicare Other | Source: Ambulatory Visit | Attending: Otolaryngology | Admitting: Otolaryngology

## 2014-06-02 DIAGNOSIS — R430 Anosmia: Secondary | ICD-10-CM

## 2014-06-02 MED ORDER — GADOBENATE DIMEGLUMINE 529 MG/ML IV SOLN
20.0000 mL | Freq: Once | INTRAVENOUS | Status: AC | PRN
  Administered 2014-06-02: 20 mL via INTRAVENOUS

## 2014-10-20 ENCOUNTER — Emergency Department (HOSPITAL_COMMUNITY): Payer: Medicare Other

## 2014-10-20 ENCOUNTER — Encounter (HOSPITAL_COMMUNITY): Payer: Self-pay | Admitting: Emergency Medicine

## 2014-10-20 ENCOUNTER — Emergency Department (HOSPITAL_COMMUNITY)
Admission: EM | Admit: 2014-10-20 | Discharge: 2014-10-20 | Disposition: A | Discharge status: Home / Self Care | Payer: Medicare Other | Attending: Emergency Medicine | Admitting: Emergency Medicine

## 2014-10-20 DIAGNOSIS — M7989 Other specified soft tissue disorders: Secondary | ICD-10-CM | POA: Insufficient documentation

## 2014-10-20 DIAGNOSIS — Z8719 Personal history of other diseases of the digestive system: Secondary | ICD-10-CM | POA: Insufficient documentation

## 2014-10-20 DIAGNOSIS — E119 Type 2 diabetes mellitus without complications: Secondary | ICD-10-CM | POA: Insufficient documentation

## 2014-10-20 DIAGNOSIS — Z794 Long term (current) use of insulin: Secondary | ICD-10-CM | POA: Diagnosis not present

## 2014-10-20 DIAGNOSIS — Z7982 Long term (current) use of aspirin: Secondary | ICD-10-CM | POA: Insufficient documentation

## 2014-10-20 DIAGNOSIS — M1990 Unspecified osteoarthritis, unspecified site: Secondary | ICD-10-CM | POA: Diagnosis not present

## 2014-10-20 DIAGNOSIS — E785 Hyperlipidemia, unspecified: Secondary | ICD-10-CM | POA: Insufficient documentation

## 2014-10-20 DIAGNOSIS — Z880 Allergy status to penicillin: Secondary | ICD-10-CM | POA: Diagnosis not present

## 2014-10-20 DIAGNOSIS — M79671 Pain in right foot: Secondary | ICD-10-CM | POA: Diagnosis not present

## 2014-10-20 DIAGNOSIS — Z8669 Personal history of other diseases of the nervous system and sense organs: Secondary | ICD-10-CM | POA: Insufficient documentation

## 2014-10-20 DIAGNOSIS — Z79899 Other long term (current) drug therapy: Secondary | ICD-10-CM | POA: Insufficient documentation

## 2014-10-20 DIAGNOSIS — E669 Obesity, unspecified: Secondary | ICD-10-CM | POA: Diagnosis not present

## 2014-10-20 DIAGNOSIS — I10 Essential (primary) hypertension: Secondary | ICD-10-CM | POA: Insufficient documentation

## 2014-10-20 DIAGNOSIS — Z862 Personal history of diseases of the blood and blood-forming organs and certain disorders involving the immune mechanism: Secondary | ICD-10-CM | POA: Diagnosis not present

## 2014-10-20 MED ORDER — HYDROCODONE-ACETAMINOPHEN 5-325 MG PO TABS
2.0000 | ORAL_TABLET | Freq: Once | ORAL | Status: AC
  Administered 2014-10-20: 2 via ORAL
  Filled 2014-10-20: qty 2

## 2014-10-20 MED ORDER — HYDROCODONE-ACETAMINOPHEN 5-325 MG PO TABS: 1.0000 | ORAL_TABLET | ORAL | Status: DC | PRN

## 2014-10-20 NOTE — ED Provider Notes (Signed)
TIME SEEN: 11:55 AM  CHIEF COMPLAINT: Right foot pain  HPI: Pt is a 79 y.o. female with history of hypertension, diabetes, hyperlipidemia, obesity who presents emergency department with right foot pain that started 4 days ago and has progressively gotten worse. Worse with walking and putting pressure on the foot. She states it is painful over the dorsal aspect of her foot but she has normal range of motion in her toes and ankle. No history of injury. She has had some minimal swelling but no erythema or warmth. No fever. States she has had this many times in the past but will resolve on its end. Has been taking ibuprofen at home with some relief of pain.  ROS: See HPI Constitutional: no fever  Eyes: no drainage  ENT: no runny nose   Cardiovascular:  no chest pain  Resp: no SOB  GI: no vomiting GU: no dysuria Integumentary: no rash  Allergy: no hives  Musculoskeletal: no leg swelling  Neurological: no slurred speech ROS otherwise negative  PAST MEDICAL HISTORY/PAST SURGICAL HISTORY:  Past Medical History  Diagnosis Date  . Hypertension   . Migraine   . Diabetes mellitus   . Obesity   . Sleep apnea   . Carotid arterial disease   . Reflux   . Arthritis   . Anemia   . Hyperlipidemia     MEDICATIONS:  Prior to Admission medications   Medication Sig Start Date End Date Taking? Authorizing Provider  aspirin EC 81 MG tablet Take 81 mg by mouth daily.      Historical Provider, MD  BD INSULIN SYRINGE ULTRAFINE 31G X 5/16" 1 ML MISC  12/20/12   Historical Provider, MD  diltiazem (TIAZAC) 120 MG 24 hr capsule Take 120 mg by mouth daily.      Historical Provider, MD  enalapril (VASOTEC) 5 MG tablet Take 5 mg by mouth 2 (two) times daily.      Historical Provider, MD  Ergocalciferol (VITAMIN D2) 2000 UNITS TABS Take 2,000 Units by mouth daily.    Historical Provider, MD  exenatide (BYETTA) 5 MCG/0.02ML SOLN Inject 5 mcg into the skin 2 (two) times daily with a meal.     Historical  Provider, MD  ezetimibe (ZETIA) 10 MG tablet Take 10 mg by mouth every evening.      Historical Provider, MD  hydrochlorothiazide (HYDRODIURIL) 50 MG tablet Take 50 mg by mouth daily.      Historical Provider, MD  insulin glargine (LANTUS) 100 UNIT/ML injection Inject 50 Units into the skin every morning.     Historical Provider, MD  meclizine (ANTIVERT) 25 MG tablet Take 25 mg by mouth 3 (three) times daily as needed for dizziness.    Historical Provider, MD  ondansetron (ZOFRAN) 8 MG tablet Take by mouth every 8 (eight) hours as needed for nausea.    Historical Provider, MD  oxybutynin (DITROPAN-XL) 10 MG 24 hr tablet Take 10 mg by mouth daily.      Historical Provider, MD  pravastatin (PRAVACHOL) 40 MG tablet Take 40 mg by mouth daily.     Historical Provider, MD    ALLERGIES:  Allergies  Allergen Reactions  . Amoxicillin Nausea Only  . Invokana [Canagliflozin] Nausea Only and Other (See Comments)    Dizziness     SOCIAL HISTORY:  History  Substance Use Topics  . Smoking status: Never Smoker   . Smokeless tobacco: Never Used  . Alcohol Use: No    FAMILY HISTORY: No family history on file.  EXAM: BP 160/66 mmHg  Pulse 77  Temp(Src) 97.9 F (36.6 C) (Oral)  Resp 17  Ht 5\' 2"  (1.575 m)  Wt 350 lb (158.759 kg)  BMI 64.00 kg/m2  SpO2 99% CONSTITUTIONAL: Alert and oriented and responds appropriately to questions. Well-appearing; well-nourished HEAD: Normocephalic EYES: Conjunctivae clear, PERRL ENT: normal nose; no rhinorrhea; moist mucous membranes; pharynx without lesions noted NECK: Supple, no meningismus, no LAD  CARD: RRR; S1 and S2 appreciated; no murmurs, no clicks, no rubs, no gallops RESP: Normal chest excursion without splinting or tachypnea; breath sounds clear and equal bilaterally; no wheezes, no rhonchi, no rales,  ABD/GI: Normal bowel sounds; non-distended; soft, non-tender, no rebound, no guarding BACK:  The back appears normal and is non-tender to  palpation, there is no CVA tenderness EXT: Tender to palpation over the right dorsal foot with no erythema or warmth, no induration or fluctuance, patient does have a small swelling over the dorsal foot but no joint effusions, normal range of motion in the toes and ankle, no calf tenderness, compartment soft, otherwise Normal ROM in all joints; otherwise extremity is are non-tender to palpation; no edema; normal capillary refill; no cyanosis; 2+ DP pulses bilaterally    SKIN: Normal color for age and race; warm NEURO: Moves all extremities equally PSYCH: The patient's mood and manner are appropriate. Grooming and personal hygiene are appropriate.  MEDICAL DECISION MAKING: Patient here with right foot pain and swelling. She states she has been working out more than normal and walking on a track at her gym more laps than normal. Suspect tendinitis. No sign of cellulitis. No joint effusion to suggest septic arthritis. No pain over her joints to suggest gout or erythema or warmth. We'll give Vicodin for pain and obtain a right foot x-ray.  ED PROGRESS: X-ray shows no acute injury, no sign of osteomyelitis, no fracture or other deformity. Patient reports some improvement in pain with Vicodin. She does use a cane at baseline to ambulate. Have advised her to continue using NSAIDs and over-the-counter Tylenol as needed for pain. We'll also discharge with prescription for Vicodin to use as needed. Have instructed her to elevate her leg, rest, apply ice. Discussed with patient his symptoms continue she may need to see her primary care physician for further workup. Discussed with patient that I suspect that this is an injury from overuse and possible tendinitis. She verbalizes understanding and is comfortable with plan.     [place], DO 10/20/14 1354

## 2014-10-20 NOTE — Discharge Instructions (Signed)
You may use ibuprofen 600 mg every 8 hours as needed for pain. Please do not take this medication for more than 7 days. Please take with food. May also use over-the-counter Tylenol 1000 mg every 6 hours as needed for pain. Please note that Vicodin also has Tylenol and it also called acetaminophen. You should not take more than 4000 mg of acetaminophen in 24 hours. I recommend that she rest, keep your foot elevated at all times at rest, apply ice to your foot several times a day for 15-20 minutes at a time. If your pain continues I recommend he follow up with your primary care physician for further evaluation. X-ray showed no sign of fracture today. There is no sign of infection on exam.   Possible Tendinitis Tendinitis is swelling and inflammation of the tendons. Tendons are band-like tissues that connect muscle to bone. Tendinitis commonly occurs in the:   Shoulders (rotator cuff).  Heels (Achilles tendon).  Elbows (triceps tendon). CAUSES Tendinitis is usually caused by overusing the tendon, muscles, and joints involved. When the tissue surrounding a tendon (synovium) becomes inflamed, it is called tenosynovitis. Tendinitis commonly develops in people whose jobs require repetitive motions. SYMPTOMS  Pain.  Tenderness.  Mild swelling. DIAGNOSIS Tendinitis is usually diagnosed by physical exam. Your health care provider may also order X-rays or other imaging tests. TREATMENT Your health care provider may recommend certain medicines or exercises for your treatment. HOME CARE INSTRUCTIONS   Use a sling or splint for as long as directed by your health care provider until the pain decreases.  Put ice on the injured area.  Put ice in a plastic bag.  Place a towel between your skin and the bag.  Leave the ice on for 15-20 minutes, 3-4 times a day, or as directed by your health care provider.  Avoid using the limb while the tendon is painful. Perform gentle range of motion exercises only  as directed by your health care provider. Stop exercises if pain or discomfort increase, unless directed otherwise by your health care provider.  Only take over-the-counter or prescription medicines for pain, discomfort, or fever as directed by your health care provider. SEEK MEDICAL CARE IF:   Your pain and swelling increase.  You develop new, unexplained symptoms, especially increased numbness in the hands. MAKE SURE YOU:   Understand these instructions.  Will watch your condition.  Will get help right away if you are not doing well or get worse. Document Released: 08/18/2000 Document Revised: 01/05/2014 Document Reviewed: 11/07/2010 [facility] Patient Information 2015 [place], [place]. This information is not intended to replace advice given to you by your health care provider. Make sure you discuss any questions you have with your health care provider.    RICE: Routine Care for Injuries The routine care of many injuries includes Rest, Ice, Compression, and Elevation (RICE). HOME CARE INSTRUCTIONS  Rest is needed to allow your body to heal. Routine activities can usually be resumed when comfortable. Injured tendons and bones can take up to 6 weeks to heal. Tendons are the cord-like structures that attach muscle to bone.  Ice following an injury helps keep the swelling down and reduces pain.  Put ice in a plastic bag.  Place a towel between your skin and the bag.  Leave the ice on for 15-20 minutes, 3-4 times a day, or as directed by your health care provider. Do this while awake, for the first 24 to 48 hours. After that, continue as directed by your caregiver.  Compression helps keep swelling down. It also gives support and helps with discomfort. If an elastic bandage has been applied, it should be removed and reapplied every 3 to 4 hours. It should not be applied tightly, but firmly enough to keep swelling down. Watch fingers or toes for swelling, bluish discoloration, coldness,  numbness, or excessive pain. If any of these problems occur, remove the bandage and reapply loosely. Contact your caregiver if these problems continue.  Elevation helps reduce swelling and decreases pain. With extremities, such as the arms, hands, legs, and feet, the injured area should be placed near or above the level of the heart, if possible. SEEK IMMEDIATE MEDICAL CARE IF:  You have persistent pain and swelling.  You develop redness, numbness, or unexpected weakness.  Your symptoms are getting worse rather than improving after several days. These symptoms may indicate that further evaluation or further X-rays are needed. Sometimes, X-rays may not show a small broken bone (fracture) until 1 week or 10 days later. Make a follow-up appointment with your caregiver. Ask when your X-ray results will be ready. Make sure you get your X-ray results. Document Released: 12/03/2000 Document Revised: 08/26/2013 Document Reviewed: 01/20/2011 [facility] Patient Information 2015 [place], [place]. This information is not intended to replace advice given to you by your health care provider. Make sure you discuss any questions you have with your health care provider.

## 2014-10-20 NOTE — ED Notes (Signed)
Patient coming from home with c/o of right foot pain that started Saturday morning that has progressively gotten worse.  Patient is more tender to palpation to the soul of the foot near the toes, with good movement and pulse in the right foot.  Patient is able to bear minimal weight to the foot as she transferred from wheelchair to stretcher.

## 2014-11-05 ENCOUNTER — Ambulatory Visit: Payer: Medicare Other | Admitting: Podiatry

## 2015-10-08 ENCOUNTER — Emergency Department (HOSPITAL_COMMUNITY): Payer: Medicare Other

## 2015-10-08 ENCOUNTER — Encounter (HOSPITAL_COMMUNITY): Payer: Self-pay | Admitting: *Deleted

## 2015-10-08 ENCOUNTER — Observation Stay (HOSPITAL_COMMUNITY)
Admission: EM | Admit: 2015-10-08 | Discharge: 2015-10-12 | Disposition: A | Discharge status: Home / Self Care | Payer: Medicare Other | Attending: Internal Medicine | Admitting: Internal Medicine

## 2015-10-08 DIAGNOSIS — N184 Chronic kidney disease, stage 4 (severe): Secondary | ICD-10-CM

## 2015-10-08 DIAGNOSIS — I129 Hypertensive chronic kidney disease with stage 1 through stage 4 chronic kidney disease, or unspecified chronic kidney disease: Secondary | ICD-10-CM | POA: Diagnosis not present

## 2015-10-08 DIAGNOSIS — I2510 Atherosclerotic heart disease of native coronary artery without angina pectoris: Secondary | ICD-10-CM | POA: Diagnosis not present

## 2015-10-08 DIAGNOSIS — N183 Chronic kidney disease, stage 3 unspecified: Secondary | ICD-10-CM

## 2015-10-08 DIAGNOSIS — I739 Peripheral vascular disease, unspecified: Secondary | ICD-10-CM

## 2015-10-08 DIAGNOSIS — E119 Type 2 diabetes mellitus without complications: Secondary | ICD-10-CM

## 2015-10-08 DIAGNOSIS — I6522 Occlusion and stenosis of left carotid artery: Secondary | ICD-10-CM | POA: Diagnosis present

## 2015-10-08 DIAGNOSIS — Z6841 Body Mass Index (BMI) 40.0 and over, adult: Secondary | ICD-10-CM | POA: Insufficient documentation

## 2015-10-08 DIAGNOSIS — Z794 Long term (current) use of insulin: Secondary | ICD-10-CM | POA: Diagnosis not present

## 2015-10-08 DIAGNOSIS — I10 Essential (primary) hypertension: Secondary | ICD-10-CM

## 2015-10-08 DIAGNOSIS — N179 Acute kidney failure, unspecified: Secondary | ICD-10-CM | POA: Diagnosis not present

## 2015-10-08 DIAGNOSIS — I779 Disorder of arteries and arterioles, unspecified: Secondary | ICD-10-CM | POA: Diagnosis not present

## 2015-10-08 DIAGNOSIS — G4730 Sleep apnea, unspecified: Secondary | ICD-10-CM | POA: Diagnosis present

## 2015-10-08 DIAGNOSIS — Z8249 Family history of ischemic heart disease and other diseases of the circulatory system: Secondary | ICD-10-CM | POA: Insufficient documentation

## 2015-10-08 DIAGNOSIS — I209 Angina pectoris, unspecified: Secondary | ICD-10-CM | POA: Diagnosis present

## 2015-10-08 DIAGNOSIS — E1122 Type 2 diabetes mellitus with diabetic chronic kidney disease: Secondary | ICD-10-CM | POA: Insufficient documentation

## 2015-10-08 DIAGNOSIS — K219 Gastro-esophageal reflux disease without esophagitis: Secondary | ICD-10-CM | POA: Insufficient documentation

## 2015-10-08 DIAGNOSIS — R072 Precordial pain: Principal | ICD-10-CM | POA: Insufficient documentation

## 2015-10-08 DIAGNOSIS — R079 Chest pain, unspecified: Secondary | ICD-10-CM

## 2015-10-08 DIAGNOSIS — Z79899 Other long term (current) drug therapy: Secondary | ICD-10-CM | POA: Diagnosis not present

## 2015-10-08 DIAGNOSIS — R0789 Other chest pain: Secondary | ICD-10-CM | POA: Insufficient documentation

## 2015-10-08 DIAGNOSIS — M1990 Unspecified osteoarthritis, unspecified site: Secondary | ICD-10-CM | POA: Diagnosis not present

## 2015-10-08 DIAGNOSIS — Z7982 Long term (current) use of aspirin: Secondary | ICD-10-CM | POA: Diagnosis not present

## 2015-10-08 DIAGNOSIS — I472 Ventricular tachycardia: Secondary | ICD-10-CM | POA: Diagnosis not present

## 2015-10-08 DIAGNOSIS — E785 Hyperlipidemia, unspecified: Secondary | ICD-10-CM | POA: Diagnosis not present

## 2015-10-08 DIAGNOSIS — G4733 Obstructive sleep apnea (adult) (pediatric): Secondary | ICD-10-CM | POA: Diagnosis present

## 2015-10-08 DIAGNOSIS — I200 Unstable angina: Secondary | ICD-10-CM | POA: Diagnosis present

## 2015-10-08 DIAGNOSIS — F419 Anxiety disorder, unspecified: Secondary | ICD-10-CM | POA: Diagnosis not present

## 2015-10-08 DIAGNOSIS — E669 Obesity, unspecified: Secondary | ICD-10-CM

## 2015-10-08 DIAGNOSIS — I119 Hypertensive heart disease without heart failure: Secondary | ICD-10-CM

## 2015-10-08 DIAGNOSIS — I4729 Other ventricular tachycardia: Secondary | ICD-10-CM

## 2015-10-08 DIAGNOSIS — R9439 Abnormal result of other cardiovascular function study: Secondary | ICD-10-CM | POA: Diagnosis present

## 2015-10-08 LAB — BASIC METABOLIC PANEL
Anion gap: 10 (ref 5–15)
BUN: 30 mg/dL — AB (ref 6–20)
CO2: 29 mmol/L (ref 22–32)
CREATININE: 1.63 mg/dL — AB (ref 0.44–1.00)
Calcium: 9 mg/dL (ref 8.9–10.3)
Chloride: 101 mmol/L (ref 101–111)
GFR calc Af Amer: 34 mL/min — ABNORMAL LOW (ref >60)
GFR, EST NON AFRICAN AMERICAN: 29 mL/min — AB (ref >60)
GLUCOSE: 113 mg/dL — AB (ref 65–99)
POTASSIUM: 4 mmol/L (ref 3.5–5.1)
Sodium: 140 mmol/L (ref 135–145)

## 2015-10-08 LAB — CBC
HEMATOCRIT: 35.7 % — AB (ref 36.0–46.0)
Hemoglobin: 11.6 g/dL — ABNORMAL LOW (ref 12.0–15.0)
MCH: 27.2 pg (ref 26.0–34.0)
MCHC: 32.5 g/dL (ref 30.0–36.0)
MCV: 83.6 fL (ref 78.0–100.0)
PLATELETS: 232 10*3/uL (ref 150–400)
RBC: 4.27 MIL/uL (ref 3.87–5.11)
RDW: 14 % (ref 11.5–15.5)
WBC: 6 10*3/uL (ref 4.0–10.5)

## 2015-10-08 LAB — GLUCOSE, CAPILLARY
GLUCOSE-CAPILLARY: 110 mg/dL — AB (ref 65–99)
GLUCOSE-CAPILLARY: 113 mg/dL — AB (ref 65–99)

## 2015-10-08 LAB — TROPONIN I
Troponin I: <0.03 ng/mL (ref <0.031)
Troponin I: <0.03 ng/mL (ref <0.031)

## 2015-10-08 LAB — I-STAT TROPONIN, ED: Troponin i, poc: 0.01 ng/mL (ref 0.00–0.08)

## 2015-10-08 MED ORDER — PANTOPRAZOLE SODIUM 40 MG PO TBEC
40.0000 mg | DELAYED_RELEASE_TABLET | Freq: Every day | ORAL | Status: DC
  Administered 2015-10-09 – 2015-10-11 (×3): 40 mg via ORAL
  Filled 2015-10-08 (×3): qty 1

## 2015-10-08 MED ORDER — ONDANSETRON HCL 4 MG/2ML IJ SOLN
4.0000 mg | Freq: Four times a day (QID) | INTRAMUSCULAR | Status: DC | PRN
Start: 2015-10-08 — End: 2015-10-12

## 2015-10-08 MED ORDER — DILTIAZEM HCL ER BEADS 120 MG PO CP24
120.0000 mg | ORAL_CAPSULE | Freq: Every day | ORAL | Status: DC
  Administered 2015-10-09 – 2015-10-11 (×3): 120 mg via ORAL
  Filled 2015-10-08 (×8): qty 1

## 2015-10-08 MED ORDER — ASPIRIN EC 81 MG PO TBEC
81.0000 mg | DELAYED_RELEASE_TABLET | Freq: Every day | ORAL | Status: DC
  Administered 2015-10-09 – 2015-10-11 (×3): 81 mg via ORAL
  Filled 2015-10-08 (×3): qty 1

## 2015-10-08 MED ORDER — ASPIRIN 81 MG PO CHEW
324.0000 mg | CHEWABLE_TABLET | Freq: Once | ORAL | Status: AC
  Administered 2015-10-08: 324 mg via ORAL
  Filled 2015-10-08: qty 4

## 2015-10-08 MED ORDER — GI COCKTAIL ~~LOC~~: 30.0000 mL | Freq: Four times a day (QID) | ORAL | Status: DC | PRN

## 2015-10-08 MED ORDER — MORPHINE SULFATE (PF) 2 MG/ML IV SOLN: 2.0000 mg | INTRAVENOUS | Status: DC | PRN

## 2015-10-08 MED ORDER — ENOXAPARIN SODIUM 40 MG/0.4ML ~~LOC~~ SOLN
40.0000 mg | SUBCUTANEOUS | Status: DC
  Administered 2015-10-08 – 2015-10-11 (×4): 40 mg via SUBCUTANEOUS
  Filled 2015-10-08 (×5): qty 0.4

## 2015-10-08 MED ORDER — EXENATIDE 10 MCG/0.04ML ~~LOC~~ SOPN: 10.0000 ug | PEN_INJECTOR | Freq: Two times a day (BID) | SUBCUTANEOUS | Status: DC

## 2015-10-08 MED ORDER — SODIUM CHLORIDE 0.9 % IV BOLUS (SEPSIS)
1000.0000 mL | Freq: Once | INTRAVENOUS | Status: AC
  Administered 2015-10-08: 1000 mL via INTRAVENOUS

## 2015-10-08 MED ORDER — ALPRAZOLAM 0.25 MG PO TABS
0.2500 mg | ORAL_TABLET | Freq: Two times a day (BID) | ORAL | Status: DC | PRN
Start: 2015-10-08 — End: 2015-10-12

## 2015-10-08 MED ORDER — INSULIN GLARGINE 100 UNIT/ML ~~LOC~~ SOLN
65.0000 [IU] | Freq: Every day | SUBCUTANEOUS | Status: DC
  Administered 2015-10-09 – 2015-10-11 (×3): 65 [IU] via SUBCUTANEOUS
  Filled 2015-10-08 (×4): qty 0.65

## 2015-10-08 MED ORDER — PRAVASTATIN SODIUM 40 MG PO TABS
40.0000 mg | ORAL_TABLET | Freq: Every day | ORAL | Status: DC
  Administered 2015-10-09 – 2015-10-11 (×3): 40 mg via ORAL
  Filled 2015-10-08 (×3): qty 1

## 2015-10-08 MED ORDER — HYDROCHLOROTHIAZIDE 50 MG PO TABS
50.0000 mg | ORAL_TABLET | Freq: Every day | ORAL | Status: DC
  Administered 2015-10-09 – 2015-10-10 (×2): 50 mg via ORAL
  Filled 2015-10-08: qty 1
  Filled 2015-10-08: qty 2
  Filled 2015-10-08: qty 1
  Filled 2015-10-08: qty 2
  Filled 2015-10-08: qty 1

## 2015-10-08 MED ORDER — LATANOPROST 0.005 % OP SOLN
1.0000 [drp] | Freq: Every day | OPHTHALMIC | Status: DC
Start: 2015-10-08 — End: 2015-10-12
  Administered 2015-10-08 – 2015-10-11 (×4): 1 [drp] via OPHTHALMIC
  Filled 2015-10-08: qty 2.5

## 2015-10-08 MED ORDER — INSULIN ASPART 100 UNIT/ML ~~LOC~~ SOLN
0.0000 [IU] | Freq: Three times a day (TID) | SUBCUTANEOUS | Status: DC
  Administered 2015-10-09 (×2): 1 [IU] via SUBCUTANEOUS
  Administered 2015-10-10: 2 [IU] via SUBCUTANEOUS
  Administered 2015-10-11: 1 [IU] via SUBCUTANEOUS

## 2015-10-08 MED ORDER — OXYBUTYNIN CHLORIDE ER 10 MG PO TB24
10.0000 mg | ORAL_TABLET | Freq: Every day | ORAL | Status: DC
  Administered 2015-10-09 – 2015-10-11 (×3): 10 mg via ORAL
  Filled 2015-10-08 (×6): qty 1

## 2015-10-08 MED ORDER — ACETAMINOPHEN 325 MG PO TABS: 650.0000 mg | ORAL_TABLET | ORAL | Status: DC | PRN

## 2015-10-08 MED ORDER — EZETIMIBE 10 MG PO TABS
10.0000 mg | ORAL_TABLET | Freq: Every evening | ORAL | Status: DC
  Administered 2015-10-08 – 2015-10-11 (×4): 10 mg via ORAL
  Filled 2015-10-08 (×4): qty 1

## 2015-10-08 NOTE — H&P (Addendum)
[facility] History and Physical  KRIPA PULLIUM [id number] DOB: [birth year] DOA: 10/08/2015  Referring physician:   PCP: Antony Blackbird, MD   Chief Complaint:  Chest pain   HPI:  80yo F w/ PMH including HTN, HLD, T2DM, OSA, carotid artery disease who presents with chest pain. The patient reports that for the past several weeks she has had intermittent episodes of central chest pressure that occurs at rest or with exertion most commonly precipitated by anxiety and stress. Symptoms last only 5 minutes, only associated with headache, not associated with syncope near syncope palpitations or lightheadedness. She occasionally has pain down her bilateral arms but she is unsure whether it is related to her water aerobics activities.  No recent fever, nausea, vomiting , diarrhea.   Family history positive for both parents and 2 brothers who died of a heart attack. Initial troponin, EKG, chest x-ray negative ,  Creatinine 1.6 , baseline unknown , currently chest pain-free     Review of Systems: negative for the following  Constitutional: Denies fever, chills, diaphoresis, appetite change and fatigue.  HEENT: Denies photophobia, eye pain, redness, hearing loss, ear pain, congestion, sore throat, rhinorrhea, sneezing, mouth sores, trouble swallowing, neck pain, neck stiffness and tinnitus.  Respiratory: Denies SOB, DOE, cough, chest tightness, and wheezing.  Cardiovascular:  Positive for chest pain, negative for palpitations and leg swelling.  Gastrointestinal: Denies nausea, vomiting, abdominal pain, diarrhea, constipation, blood in stool and abdominal distention.  Genitourinary: Denies dysuria, urgency, frequency, hematuria, flank pain and difficulty urinating.  Musculoskeletal: Denies myalgias, back pain, joint swelling, arthralgias and gait problem.  Skin: Denies pallor, rash and wound.  Neurological: Denies dizziness, seizures, syncope, weakness, light-headedness, numbness and headaches.   Hematological: Denies adenopathy. Easy bruising, personal or family bleeding history  Psychiatric/Behavioral: Denies suicidal ideation, mood changes, confusion, nervousness, sleep disturbance and agitation       Past Medical History  Diagnosis Date  . Hypertension   . Migraine   . Diabetes mellitus   . Obesity   . Sleep apnea   . Carotid arterial disease ([place])   . Reflux   . Arthritis   . Anemia   . Hyperlipidemia      Past Surgical History  Procedure Laterality Date  . Joint replacement Right 2005    knee  . Cholecystectomy  50 yrs ago  . Wisdom tooth extraction        Social History:  reports that she has never smoked. She has never used smokeless tobacco. She reports that she does not drink alcohol or use illicit drugs.     Allergies  Allergen Reactions  . Amoxicillin Nausea Only    Has patient had a PCN reaction causing immediate rash, facial/tongue/throat swelling, SOB or lightheadedness with hypotension: yes, i was dizzy Has patient had a PCN reaction causing severe rash involving mucus membranes or skin necrosis: no Did a PCN reaction that required hospitalization : no, called the DR. Did PCN reaction occurring within the last 10 years: unknown- pt cant recall exactly how long ago it was. if all of the above answers are "NO", then may proceed with Cephalosporin use.   [name] [Canagliflozin] Nausea Only and Other (See Comments)    Dizziness         FAMILY HISTORY   Patient reports coronary artery disease in 2 brothers and both parents    Prior to Admission medications   Medication Sig Start Date End Date Taking? Authorizing Provider  aspirin EC 81 MG tablet Take  81 mg by mouth daily.     Yes Historical Provider, MD  diltiazem (TIAZAC) 120 MG 24 hr capsule Take 120 mg by mouth daily.     Yes Historical Provider, MD  enalapril (VASOTEC) 10 MG tablet Take 10 mg by mouth 2 (two) times daily.   Yes Historical Provider, MD  exenatide (BYETTA) 10  MCG/0.04ML SOPN injection Inject 10 mcg into the skin 2 (two) times daily with a meal.   Yes Historical Provider, MD  ezetimibe (ZETIA) 10 MG tablet Take 10 mg by mouth every evening.     Yes Historical Provider, MD  hydrochlorothiazide (HYDRODIURIL) 50 MG tablet Take 50 mg by mouth daily.     Yes Historical Provider, MD  insulin glargine (LANTUS) 100 UNIT/ML injection Inject 65 Units into the skin every morning.    Yes Historical Provider, MD  latanoprost (XALATAN) 0.005 % ophthalmic solution Place 1 drop into the left eye at bedtime. 09/26/15  Yes Historical Provider, MD  oxybutynin (DITROPAN-XL) 10 MG 24 hr tablet Take 10 mg by mouth daily.     Yes Historical Provider, MD  pantoprazole (PROTONIX) 40 MG tablet Take 40 mg by mouth daily. 08/25/15  Yes Historical Provider, MD  pravastatin (PRAVACHOL) 40 MG tablet Take 40 mg by mouth daily.    Yes Historical Provider, MD     Physical Exam: Filed Vitals:   10/08/15 0812 10/08/15 1100 10/08/15 1106 10/08/15 1130  BP: 136/63 129/69 143/66 148/69  Pulse: 77 71 67 72  Temp: 98 F (36.7 C)     TempSrc: Oral     Resp: 14 19 20 21   SpO2: 96% 100% 100% 100%     Constitutional: Vital signs reviewed. Patient is a well-developed and  Morbidly obese,no acute distress and cooperative with exam. Alert and oriented x3.  Head: Normocephalic and atraumatic  Ear: TM normal bilaterally  Mouth: no erythema or exudates, MMM  Eyes: PERRL, EOMI, conjunctivae normal, No scleral icterus.  Neck: Supple, Trachea midline normal ROM, No JVD, mass, thyromegaly, or carotid bruit present.  Cardiovascular: RRR, S1 normal, S2 normal, no MRG, pulses symmetric and intact bilaterally  Pulmonary/Chest: CTAB, no wheezes, rales, or rhonchi  Abdominal: Soft. Non-tender, non-distended, bowel sounds are normal, no masses, organomegaly, or guarding present.  GU: no CVA tenderness Musculoskeletal: No joint deformities, erythema, or stiffness, ROM full and no nontender Ext: no  edema and no cyanosis, pulses palpable bilaterally (DP and PT)  Hematology: no cervical, inginal, or axillary adenopathy.  Neurological: A&O x3, Strenght is normal and symmetric bilaterally, cranial nerve II-XII are grossly intact, no focal motor deficit, sensory intact to light touch bilaterally.  Skin: Warm, dry and intact. No rash, cyanosis, or clubbing.  Psychiatric: Normal mood and affect. speech and behavior is normal. Judgment and thought content normal. Cognition and memory are normal.      Data Review   Micro Results No results found for this or any previous visit (from the past 240 hour(s)).  Radiology Reports Dg Chest 2 View  10/08/2015  CLINICAL DATA:  Upper chest pain for few days EXAM: CHEST  2 VIEW COMPARISON:  10/13/2011 FINDINGS: The heart size and mediastinal contours are within normal limits. Both lungs are clear. The visualized skeletal structures show degenerative changes of the acromioclavicular joints bilaterally. IMPRESSION: No active cardiopulmonary disease. Electronically Signed   By: Inez Catalina M.D.   On: 10/08/2015 08:55     CBC  Recent Labs Lab 10/08/15 0916  WBC 6.0  HGB 11.6*  HCT 35.7*  PLT 232  MCV 83.6  MCH 27.2  MCHC 32.5  RDW 14.0    Chemistries   Recent Labs Lab 10/08/15 0916  NA 140  K 4.0  CL 101  CO2 29  GLUCOSE 113*  BUN 30*  CREATININE 1.63*  CALCIUM 9.0   ------------------------------------------------------------------------------------------------------------------ CrCl cannot be calculated (Unknown ideal weight.). ------------------------------------------------------------------------------------------------------------------ No results for input(s): HGBA1C in the last 72 hours. ------------------------------------------------------------------------------------------------------------------ No results for input(s): CHOL, HDL, LDLCALC, TRIG, CHOLHDL, LDLDIRECT in the last 72  hours. ------------------------------------------------------------------------------------------------------------------ No results for input(s): TSH, T4TOTAL, T3FREE, THYROIDAB in the last 72 hours.  Invalid input(s): FREET3 ------------------------------------------------------------------------------------------------------------------ No results for input(s): VITAMINB12, FOLATE, FERRITIN, TIBC, IRON, RETICCTPCT in the last 72 hours.  Coagulation profile No results for input(s): INR, PROTIME in the last 168 hours.  No results for input(s): DDIMER in the last 72 hours.  Cardiac Enzymes No results for input(s): CKMB, TROPONINI, MYOGLOBIN in the last 168 hours.  Invalid input(s): CK ------------------------------------------------------------------------------------------------------------------ Invalid input(s): POCBNP   CBG: No results for input(s): GLUCAP in the last 168 hours.     EKG: Independently reviewed. EKG Interpretation   Date/Time: Friday October 08 2015 08:16:03 EST Ventricular Rate: 69 PR Interval: 144 QRS Duration: 97 QT Interval: 437 QTC Calculation: 468 R Axis: -58 Text Interpretation: Sinus arrhythmia Left anterior fascicular block Low  voltage, precordial leads No significant change since last tracing    Assessment/Plan Principal Problem:   Chest pain -fairly atypical most likely related to anxiety However patient's heart score is 6 with multiple risk factors There for cardiology has been consulted  This is a patient needs an inpatient stress test vs cath  Continue aspirin , continue to cycle cardiac enzymes, admit to telemetry under observation     Obesity There is no weight on file to calculate BMI.    Sleep apnea- Not on cpap    Carotid arterial disease (HCC) -Continue aspirin , statin    Diabetes mellitus type II, non insulin dependent (HCC) -check hemoglobin A1c, continue Byetta, Lantus, start patient on exercise    Hypertension continue diltiazem  , HCTZ and hold lisinopril  Given her elevated creatinine   chronic kidney disease-stage 2-3-baseline creatinine unknown,  Hold ACE inhibitor for now      Code Status Orders        Start     Ordered   10/08/15 1213  Full code   Continuous     10/08/15 1214     Family Communication: bedside Disposition Plan: admit   Total time spent 55 minutes.Greater than 50% of this time was spent in counseling, explanation of diagnosis, planning of further management, and coordination of care  [facility] Pager [phone]  If 7PM-7AM, please contact night-coverage [email or web address] Password [email or web address] 10/08/2015, 12:14 PM

## 2015-10-08 NOTE — Progress Notes (Signed)
Multiple phlebotomists unable to draw patient's scheduled troponins. IV team consulted and unable to provided assistance. MD aware of multiple delays. ED phlebotomists consulted for potential I-stat troponins. Patient refused foot draw.

## 2015-10-08 NOTE — Consult Note (Signed)
Patient ID: Jeanne [name] MRN: [id number] DOB/AGE: 03-11-1936 80 y.o.  Admit date: 10/08/2015 Referring Physician: Allyson Sabal Primary Physician: Antony Blackbird, MD Primary Cardiologist: New Reason for Consultation: Chest pain  HPI: 80 yo female with history of HTN, DM, carotid artery disease, OSA, morbid obesity, HLD, GERD presented to the [facility] with c/o chest pain. She describes chest pain at home when under stress. The chest pain lasts for several minutes. Mostly at rest. Some radiation to the neck and arms. No dyspnea, dizziness, near syncope or syncope. No known cardiac disease. EKG with sinus, LAFB. No ischemic changes. No chest pain currently. Troponin POC is negative.    Past Medical History  Diagnosis Date  . Hypertension   . Migraine   . Diabetes mellitus   . Obesity   . Sleep apnea   . Carotid arterial disease ([place])   . Reflux   . Arthritis   . Anemia   . Hyperlipidemia     Family History  Problem Relation Age of Onset  . Heart attack Father      Social History   Social History  . Marital Status: Married    Spouse Name: N/A  . Number of Children: 3  . Years of Education: N/A   Occupational History  . Not on file.   Social History Main Topics  . Smoking status: Never Smoker   . Smokeless tobacco: Never Used  . Alcohol Use: No  . Drug Use: No  . Sexual Activity: Not on file   Other Topics Concern  . Not on file   Social History Narrative    Past Surgical History  Procedure Laterality Date  . Joint replacement Right 2005    knee  . Cholecystectomy  50 yrs ago  . Wisdom tooth extraction      Allergies  Allergen Reactions  . Amoxicillin Nausea Only    Has patient had a PCN reaction causing immediate rash, facial/tongue/throat swelling, SOB or lightheadedness with hypotension: yes, i was dizzy Has patient had a PCN reaction causing severe rash involving mucus membranes or skin necrosis: no Did a PCN reaction that required hospitalization : no,  called the DR. Did PCN reaction occurring within the last 10 years: unknown- pt cant recall exactly how long ago it was. if all of the above answers are "NO", then may proceed with Cephalosporin use.   [name] [Canagliflozin] Nausea Only and Other (See Comments)    Dizziness     Prior to Admission medications   Medication Sig Start Date End Date Taking? Authorizing Provider  aspirin EC 81 MG tablet Take 81 mg by mouth daily.     Yes Historical Provider, MD  diltiazem (TIAZAC) 120 MG 24 hr capsule Take 120 mg by mouth daily.     Yes Historical Provider, MD  enalapril (VASOTEC) 10 MG tablet Take 10 mg by mouth 2 (two) times daily.   Yes Historical Provider, MD  exenatide (BYETTA) 10 MCG/0.04ML SOPN injection Inject 10 mcg into the skin 2 (two) times daily with a meal.   Yes Historical Provider, MD  ezetimibe (ZETIA) 10 MG tablet Take 10 mg by mouth every evening.     Yes Historical Provider, MD  hydrochlorothiazide (HYDRODIURIL) 50 MG tablet Take 50 mg by mouth daily.     Yes Historical Provider, MD  insulin glargine (LANTUS) 100 UNIT/ML injection Inject 65 Units into the skin every morning.    Yes Historical Provider, MD  latanoprost (XALATAN) 0.005 % ophthalmic solution  Place 1 drop into the left eye at bedtime. 09/26/15  Yes Historical Provider, MD  oxybutynin (DITROPAN-XL) 10 MG 24 hr tablet Take 10 mg by mouth daily.     Yes Historical Provider, MD  pantoprazole (PROTONIX) 40 MG tablet Take 40 mg by mouth daily. 08/25/15  Yes Historical Provider, MD  pravastatin (PRAVACHOL) 40 MG tablet Take 40 mg by mouth daily.    Yes Historical Provider, MD    Review of systems complete and found to be negative unless listed above    Physical Exam: Blood pressure 145/65, pulse 76, temperature 97.6 F (36.4 C), temperature source Oral, resp. rate 18, height 5\' 3"  (1.6 m), weight 325 lb 6.4 oz (147.6 kg), SpO2 100 %.    General: Well developed, well nourished, NAD  HEENT: OP clear, mucus  membranes moist  SKIN: warm, dry. No rashes.  Neuro: No focal deficits  Musculoskeletal: Muscle strength 5/5 all ext  Psychiatric: Mood and affect normal  Neck: No JVD, no carotid bruits, no thyromegaly, no lymphadenopathy.  Lungs:Clear bilaterally, no wheezes, rhonci, crackles  Cardiovascular: Regular rate and rhythm. No murmurs, gallops or rubs.  Abdomen:Soft. Bowel sounds present. Non-tender.  Extremities: No lower extremity edema. Pulses are 2 + in the bilateral DP/PT.   Labs:   Lab Results  Component Value Date   WBC 6.0 10/08/2015   HGB 11.6* 10/08/2015   HCT 35.7* 10/08/2015   MCV 83.6 10/08/2015   PLT 232 10/08/2015     Recent Labs Lab 10/08/15 0916  NA 140  K 4.0  CL 101  CO2 29  BUN 30*  CREATININE 1.63*  CALCIUM 9.0  GLUCOSE 113*   Lab Results  Component Value Date   TROPONINI <0.30 10/13/2011     EKG: Sinus, LAFB, no ischemic changes.   ASSESSMENT AND PLAN:   1. Chest pain: Her chest pain has mostly atypical features but she does have risk factors for CAD including age, DM, HTN, HLD, known PAD. Agree with admission. Cycle cardiac markers. If troponin negative, would arrange nuclear stress test in the am. This will likely have to be a 2 day study. Will arrange echo to assess LVEF and exclude structural heart disease.   Signed: Lauree Chandler, MD 10/08/2015, 1:21 PM

## 2015-10-08 NOTE — ED Notes (Signed)
Pt reports intermittent mid-sternal cp for "few weeks".  Denies SOB, lightheadedness, or diaphoresis at this time.  Pain is non-radiating.  Pt states "i'm going through some personal stuff right now and it only happens when I think about it."  States this morning, when she woke up, she started to think about this personal situation and started to have the cp.  Pt denies any cp at this time.  Pt is A&Ox 4.  In NAD.

## 2015-10-08 NOTE — Progress Notes (Signed)
RT NOTE:  RN called: Pt said she felt she was suffocating, CPAP pressure to weak. No notes indicated home setting. Pt originally stated "8." I increased in slow increments until comfortable pressure reached. CPAP 14 cmH20 now set. RT will monitor.

## 2015-10-08 NOTE — ED Provider Notes (Signed)
CSN: [id number]     Arrival date & time 10/08/15  0802 History   First MD Initiated Contact with Patient 10/08/15 0805     Chief Complaint  Patient presents with  . Chest Pain     (Consider location/radiation/quality/duration/timing/severity/associated sxs/prior Treatment) HPI Comments: 80yo F w/ PMH including HTN, HLD, T2DM, OSA, carotid artery disease who presents with chest pain. The patient reports that for the past several weeks she has had intermittent episodes of central chest pressure that occurs at rest. She occasionally has pain down her bilateral arms but she is unsure whether it is related to her water aerobics activities. She denies any associated shortness of breath, nausea, vomiting, or diaphoresis. The episodes last a few minutes at a time and she feels that they often occur during times of stress however she does note that some of the episodes occur while she is going to sleep at night. She had a stress test a few years ago but does not currently follow with a cardiologist. She denies any cough/cold symptoms, fevers, or recent illness.  Family history notable for both parents with cardiac disease.  Patient is a 80 y.o. female presenting with chest pain. The history is provided by the patient.  Chest Pain   Past Medical History  Diagnosis Date  . Hypertension   . Migraine   . Diabetes mellitus   . Obesity   . Sleep apnea   . Carotid arterial disease ([place])   . Reflux   . Arthritis   . Anemia   . Hyperlipidemia    Past Surgical History  Procedure Laterality Date  . Joint replacement Right 2005    knee  . Cholecystectomy  50 yrs ago  . Wisdom tooth extraction     No family history on file. Social History  Substance Use Topics  . Smoking status: Never Smoker   . Smokeless tobacco: Never Used  . Alcohol Use: No   OB History    No data available     Review of Systems  Cardiovascular: Positive for chest pain.   10 Systems reviewed and are negative for acute  change except as noted in the HPI.    Allergies  Amoxicillin and Invokana  Home Medications   Prior to Admission medications   Medication Sig Start Date End Date Taking? Authorizing Provider  aspirin EC 81 MG tablet Take 81 mg by mouth daily.     Yes Historical Provider, MD  diltiazem (TIAZAC) 120 MG 24 hr capsule Take 120 mg by mouth daily.     Yes Historical Provider, MD  enalapril (VASOTEC) 10 MG tablet Take 10 mg by mouth 2 (two) times daily.   Yes Historical Provider, MD  exenatide (BYETTA) 10 MCG/0.04ML SOPN injection Inject 10 mcg into the skin 2 (two) times daily with a meal.   Yes Historical Provider, MD  ezetimibe (ZETIA) 10 MG tablet Take 10 mg by mouth every evening.     Yes Historical Provider, MD  hydrochlorothiazide (HYDRODIURIL) 50 MG tablet Take 50 mg by mouth daily.     Yes Historical Provider, MD  insulin glargine (LANTUS) 100 UNIT/ML injection Inject 65 Units into the skin every morning.    Yes Historical Provider, MD  latanoprost (XALATAN) 0.005 % ophthalmic solution Place 1 drop into the left eye at bedtime. 09/26/15  Yes Historical Provider, MD  oxybutynin (DITROPAN-XL) 10 MG 24 hr tablet Take 10 mg by mouth daily.     Yes Historical Provider, MD  pantoprazole (PROTONIX) 40  MG tablet Take 40 mg by mouth daily. 08/25/15  Yes Historical Provider, MD  pravastatin (PRAVACHOL) 40 MG tablet Take 40 mg by mouth daily.    Yes Historical Provider, MD   BP 143/66 mmHg  Pulse 67  Temp(Src) 98 F (36.7 C) (Oral)  Resp 20  SpO2 100% Physical Exam  Constitutional: She is oriented to person, place, and time. She appears well-developed and well-nourished. No distress.  HENT:  Head: Normocephalic and atraumatic.  Moist mucous membranes  Eyes: Conjunctivae are normal. Pupils are equal, round, and reactive to light.  Neck: Neck supple.  Cardiovascular: Normal rate, regular rhythm and normal heart sounds.   No murmur heard. Pulmonary/Chest: Effort normal and breath sounds  normal.  Abdominal: Soft. Bowel sounds are normal. She exhibits no distension. There is no tenderness.  Musculoskeletal: She exhibits no edema.  Neurological: She is alert and oriented to person, place, and time.  Fluent speech  Skin: Skin is warm and dry.  Psychiatric: She has a normal mood and affect. Judgment normal.  Nursing note and vitals reviewed.   ED Course  Procedures (including critical care time) Labs Review Labs Reviewed  BASIC METABOLIC PANEL - Abnormal; Notable for the following:    Glucose, Bld 113 (*)    BUN 30 (*)    Creatinine, Ser 1.63 (*)    GFR calc non Af Amer 29 (*)    GFR calc Af Amer 34 (*)    All other components within normal limits  CBC - Abnormal; Notable for the following:    Hemoglobin 11.6 (*)    HCT 35.7 (*)    All other components within normal limits  I-STAT TROPOININ, ED    Imaging Review Dg Chest 2 View  10/08/2015  CLINICAL DATA:  Upper chest pain for few days EXAM: CHEST  2 VIEW COMPARISON:  10/13/2011 FINDINGS: The heart size and mediastinal contours are within normal limits. Both lungs are clear. The visualized skeletal structures show degenerative changes of the acromioclavicular joints bilaterally. IMPRESSION: No active cardiopulmonary disease. Electronically Signed   By: Inez Catalina M.D.   On: 10/08/2015 08:55   I have personally reviewed and evaluated these  lab results as part of my medical decision-making.   EKG Interpretation   Date/Time:  Friday October 08 2015 08:16:03 EST Ventricular Rate:  69 PR Interval:  144 QRS Duration: 97 QT Interval:  437 QTC Calculation: 468 R Axis:   -58 Text Interpretation:  Sinus arrhythmia Left anterior fascicular block Low  voltage, precordial leads No significant change since last tracing  Confirmed by LIU MD, DANA [id number]) on 10/08/2015 8:19:06 AM     Medications  aspirin chewable tablet 324 mg (324 mg Oral Given 10/08/15 0928)  sodium chloride 0.9 % bolus 1,000 mL (1,000 mLs Intravenous  New Bag/Given 10/08/15 1057)    MDM   Final diagnoses:  Chest pain, unspecified chest pain type  AKI (acute kidney injury) ([place])   80 year old female presents with several weeks of intermittent central chest pain occasionally radiating down her arms that occurs at rest. On exam, she was well-appearing with reassuring vital signs. EKG unchanged from previous. Chest x-ray shows no acute process.Patient has no risk factors for PE therefore PE is very unlikely given no SOB and intermittent nature of sx. no sudden ripping or tearing chest pain to suggest aortic dissection. I spent a long time discussing when the chest pain episodes occur and although the patient does admit that it often occurs during moments of  stress, the pain does occur at night and is not always associated with stress. Given multiple risk factors for heart disease and + FH, I contacted cardiology to be involved in patient's care. Patient's heart score is 4-5 therefore I have recommended admission for chest pain rule out and the patient is in agreement. I discussed admission with [facility], Dr. Allyson Sabal, and I appreciate assistance. Pt admitted for further evaluation.  Sharlett Iles, MD 10/08/15 1116

## 2015-10-08 NOTE — Progress Notes (Signed)
RT NOTE:  RT setup CPAP at bedside for patient. CPAP 8cm H20 per pt, home settings. No O2 bled in. Pt does not use humidity. Pt will put on when she is ready to go to sleep, eating PM snack. PT understands to call with questions/concerns. RT will monitor.

## 2015-10-09 ENCOUNTER — Observation Stay (HOSPITAL_COMMUNITY): Payer: Medicare Other

## 2015-10-09 ENCOUNTER — Observation Stay (HOSPITAL_COMMUNITY)
Admit: 2015-10-09 | Discharge: 2015-10-09 | Disposition: A | Discharge status: Home / Self Care | Payer: Medicare Other | Attending: Cardiovascular Disease | Admitting: Cardiovascular Disease

## 2015-10-09 ENCOUNTER — Ambulatory Visit (HOSPITAL_COMMUNITY)
Admit: 2015-10-09 | Discharge: 2015-10-09 | Disposition: A | Discharge status: Home / Self Care | Payer: Medicare Other | Attending: Cardiovascular Disease | Admitting: Cardiovascular Disease

## 2015-10-09 ENCOUNTER — Ambulatory Visit (HOSPITAL_COMMUNITY)
Admission: RE | Admit: 2015-10-09 | Discharge: 2015-10-09 | Disposition: A | Discharge status: Home / Self Care | Payer: Medicare Other | Source: Ambulatory Visit | Attending: Cardiovascular Disease | Admitting: Cardiovascular Disease

## 2015-10-09 DIAGNOSIS — R079 Chest pain, unspecified: Secondary | ICD-10-CM | POA: Insufficient documentation

## 2015-10-09 DIAGNOSIS — R072 Precordial pain: Secondary | ICD-10-CM | POA: Diagnosis not present

## 2015-10-09 DIAGNOSIS — I779 Disorder of arteries and arterioles, unspecified: Secondary | ICD-10-CM | POA: Diagnosis not present

## 2015-10-09 DIAGNOSIS — R0789 Other chest pain: Secondary | ICD-10-CM | POA: Insufficient documentation

## 2015-10-09 DIAGNOSIS — N179 Acute kidney failure, unspecified: Secondary | ICD-10-CM | POA: Diagnosis not present

## 2015-10-09 LAB — GLUCOSE, CAPILLARY
GLUCOSE-CAPILLARY: 149 mg/dL — AB (ref 65–99)
GLUCOSE-CAPILLARY: 142 mg/dL — AB (ref 65–99)
Glucose-Capillary: 154 mg/dL — ABNORMAL HIGH (ref 65–99)

## 2015-10-09 LAB — TROPONIN I

## 2015-10-09 MED ORDER — REGADENOSON 0.4 MG/5ML IV SOLN
INTRAVENOUS | Status: AC
  Filled 2015-10-09: qty 5

## 2015-10-09 MED ORDER — REGADENOSON 0.4 MG/5ML IV SOLN
0.4000 mg | Freq: Once | INTRAVENOUS | Status: AC
  Administered 2015-10-09: 0.4 mg via INTRAVENOUS

## 2015-10-09 NOTE — Progress Notes (Signed)
RT NOTE:  Pt will put CPAP on when she is ready. PT understands to call with questions/concerns

## 2015-10-09 NOTE — Progress Notes (Signed)
    Consulting cardiologist: Dr. Darlina Guys  Patient currently off the floor. I reviewed Dr. Camillia Herter recent consultation note regarding chest pain with atypical features in the setting of known cardiac risk factors. Subsequent troponin I levels are negative, ECG is nonspecific. She has already been scheduled for a 2 day protocol inpatient Myoview, the first images are being obtained today. Echocardiogram has also been ordered and is pending. Further recommendations to follow.  Satira Sark, M.D., F.A.C.C.

## 2015-10-09 NOTE — Progress Notes (Signed)
Patient presented for [place]. 2 day study, today is day 1. Tolerated procedure well. Result to follow.   Jeanne [name], [place]

## 2015-10-09 NOTE — Care Management Obs Status (Signed)
[facility] NOTIFICATION   Patient Details  Name: Jeanne [name] MRN: [id number] Date of Birth: 1935-10-06   Medicare Observation Status Notification Given:  Yes Patient refuses to sign notice.    Apolonio Schneiders, RN 10/09/2015, 12:08 PM

## 2015-10-09 NOTE — Progress Notes (Addendum)
[facility] PROGRESS NOTE  Jeanne [name] [id number] DOB: 1936/05/05 DOA: 10/08/2015 PCP: Antony Blackbird, MD  Length of stay:    Assessment/Plan: Principal Problem:   Chest pain Active Problems:   Obesity   Sleep apnea   Carotid arterial disease ([place])   Diabetes mellitus type II, non insulin dependent (HCC)    HPI: 80 yo female with history of HTN, DM, carotid artery disease, OSA, morbid obesity, HLD, GERD presented to the [facility] with c/o chest pain. She describes chest pain at home when under stress. The chest pain lasts for several minutes. Mostly at rest. Some radiation to the neck and arms. No dyspnea, dizziness, near syncope or syncope. No known cardiac disease. EKG with sinus, LAFB. No ischemic changes. No chest pain currently. Troponin POC is negative.   Assessment and plan Chest pain -fairly atypical most likely related to anxiety However patient's heart score is 6 with multiple risk factors Therefore  cardiology has been consulted. Patient started her 2 day nuclear stress test today 2-D echo pending, continue aspirin, cardiac enzymes negative    Obesity Body mass index is 57.66 kg/(m^2).    Sleep apnea- CPAP bedtime prn   Carotid arterial disease ([place]) -Continue aspirin , statin   Diabetes mellitus type II, non insulin dependent (HCC) -hemoglobin A1c pending, continue Byetta, Lantus, continue on SSI  Hypertension continue diltiazem , HCTZ and hold lisinopril Given her elevated creatinine  chronic kidney disease-stage 2-3-baseline creatinine unknown, Hold ACE inhibitor for now, recheck renal function tomorrow    DVT prophylaxsis Lovenox  Code Status:      Code Status Orders        Start     Ordered   10/08/15 1213  Full code   Continuous     10/08/15 1214     Family Communication: Discussed in detail with the patient, all imaging results, lab results explained to the patient   Disposition Plan:  Tomorrow if stress test negative       Consultants:  Cardiology  Procedures:  lexiscan Myoview  Antibiotics: Anti-infectives    None         HPI/Subjective: Chest pain-free this morning, scheduled for stress test, troponin negative EKG nonspecific changes,  Objective: Filed Vitals:   10/08/15 1304 10/08/15 2109 10/08/15 2119 10/09/15 0708  BP: 145/65 114/44  134/64  Pulse: 76 69 69 65  Temp: 97.6 F (36.4 C) 98.1 F (36.7 C)  98.1 F (36.7 C)  TempSrc: Oral Oral  Oral  Resp: 18 18 18 18   Height: 5\' 3"  (1.6 m)     Weight: 147.6 kg (325 lb 6.4 oz)     SpO2: 100% 99% 99% 99%    Intake/Output Summary (Last 24 hours) at 10/09/15 1024 Last data filed at 10/08/15 1700  Gross per 24 hour  Intake   1240 ml  Output      0 ml  Net   1240 ml    Exam:  General: Well developed, well nourished, NAD  HEENT: OP clear, mucus membranes moist  SKIN: warm, dry. No rashes.  Neuro: No focal deficits  Musculoskeletal: Muscle strength 5/5 all ext  Psychiatric: Mood and affect normal  Neck: No JVD, no carotid bruits, no thyromegaly, no lymphadenopathy.  Lungs:Clear bilaterally, no wheezes, rhonci, crackles  Cardiovascular: Regular rate and rhythm. No murmurs, gallops or rubs.  Abdomen:Soft. Bowel sounds present. Non-tender.  Extremities: No lower extremity edema. Pulses are 2 + in the bilateral DP/PT.   Data  Review   Micro Results No results found for this or any previous visit (from the past 240 hour(s)).  Radiology Reports Dg Chest 2 View  10/08/2015  CLINICAL DATA:  Upper chest pain for few days EXAM: CHEST  2 VIEW COMPARISON:  10/13/2011 FINDINGS: The heart size and mediastinal contours are within normal limits. Both lungs are clear. The visualized skeletal structures show degenerative changes of the acromioclavicular joints bilaterally. IMPRESSION: No active cardiopulmonary disease. Electronically Signed   By: Inez Catalina M.D.   On: 10/08/2015 08:55     CBC  Recent Labs Lab  10/08/15 0916  WBC 6.0  HGB 11.6*  HCT 35.7*  PLT 232  MCV 83.6  MCH 27.2  MCHC 32.5  RDW 14.0    Chemistries   Recent Labs Lab 10/08/15 0916  NA 140  K 4.0  CL 101  CO2 29  GLUCOSE 113*  BUN 30*  CREATININE 1.63*  CALCIUM 9.0   ------------------------------------------------------------------------------------------------------------------ estimated creatinine clearance is 41.3 mL/min (by C-G formula based on Cr of 1.63). ------------------------------------------------------------------------------------------------------------------ No results for input(s): HGBA1C in the last 72 hours. ------------------------------------------------------------------------------------------------------------------ No results for input(s): CHOL, HDL, LDLCALC, TRIG, CHOLHDL, LDLDIRECT in the last 72 hours. ------------------------------------------------------------------------------------------------------------------ No results for input(s): TSH, T4TOTAL, T3FREE, THYROIDAB in the last 72 hours.  Invalid input(s): FREET3 ------------------------------------------------------------------------------------------------------------------ No results for input(s): VITAMINB12, FOLATE, FERRITIN, TIBC, IRON, RETICCTPCT in the last 72 hours.  Coagulation profile No results for input(s): INR, PROTIME in the last 168 hours.  No results for input(s): DDIMER in the last 72 hours.  Cardiac Enzymes  Recent Labs Lab 10/08/15 1902 10/08/15 2203 10/09/15 0100  TROPONINI <0.03 <0.03 <0.03   ------------------------------------------------------------------------------------------------------------------ Invalid input(s): POCBNP   CBG:  Recent Labs Lab 10/08/15 1639 10/08/15 2112  GLUCAP 110* 113*       Studies: Dg Chest 2 View  10/08/2015  CLINICAL DATA:  Upper chest pain for few days EXAM: CHEST  2 VIEW COMPARISON:  10/13/2011 FINDINGS: The heart size and mediastinal contours  are within normal limits. Both lungs are clear. The visualized skeletal structures show degenerative changes of the acromioclavicular joints bilaterally. IMPRESSION: No active cardiopulmonary disease. Electronically Signed   By: Inez Catalina M.D.   On: 10/08/2015 08:55      No results found for: HGBA1C Lab Results  Component Value Date   CREATININE 1.63* 10/08/2015       Scheduled Meds: . aspirin EC  81 mg Oral Daily  . diltiazem  120 mg Oral Daily  . enoxaparin (LOVENOX) injection  40 mg Subcutaneous Q24H  . exenatide  10 mcg Subcutaneous BID WC  . ezetimibe  10 mg Oral QPM  . hydrochlorothiazide  50 mg Oral Daily  . insulin aspart  0-9 Units Subcutaneous TID WC  . insulin glargine  65 Units Subcutaneous QAC breakfast  . latanoprost  1 drop Left Eye QHS  . oxybutynin  10 mg Oral Daily  . pantoprazole  40 mg Oral Daily  . pravastatin  40 mg Oral Daily   Continuous Infusions:   Principal Problem:   Chest pain Active Problems:   Obesity   Sleep apnea   Carotid arterial disease (HCC)   Diabetes mellitus type II, non insulin dependent ([place])    Time spent: 45 minutes   [facility] Pager [phone]. If 7PM-7AM, please contact night-coverage at [email or web address], password [facility] 10/09/2015, 10:24 AM

## 2015-10-10 ENCOUNTER — Other Ambulatory Visit (HOSPITAL_COMMUNITY): Payer: PRIVATE HEALTH INSURANCE

## 2015-10-10 ENCOUNTER — Observation Stay (HOSPITAL_BASED_OUTPATIENT_CLINIC_OR_DEPARTMENT_OTHER): Payer: Medicare Other

## 2015-10-10 DIAGNOSIS — N179 Acute kidney failure, unspecified: Secondary | ICD-10-CM | POA: Diagnosis not present

## 2015-10-10 DIAGNOSIS — E119 Type 2 diabetes mellitus without complications: Secondary | ICD-10-CM | POA: Diagnosis not present

## 2015-10-10 DIAGNOSIS — I10 Essential (primary) hypertension: Secondary | ICD-10-CM | POA: Diagnosis not present

## 2015-10-10 DIAGNOSIS — R0789 Other chest pain: Secondary | ICD-10-CM | POA: Diagnosis not present

## 2015-10-10 DIAGNOSIS — I779 Disorder of arteries and arterioles, unspecified: Secondary | ICD-10-CM | POA: Diagnosis not present

## 2015-10-10 DIAGNOSIS — R079 Chest pain, unspecified: Secondary | ICD-10-CM | POA: Diagnosis not present

## 2015-10-10 DIAGNOSIS — R072 Precordial pain: Secondary | ICD-10-CM | POA: Diagnosis not present

## 2015-10-10 LAB — NM MYOCAR MULTI W/SPECT W/WALL MOTION / EF
CHL CUP RESTING HR STRESS: 70 {beats}/min
CSEPED: 5 min
Estimated workload: 1 METS
Exercise duration (sec): 16 s
Peak HR: 98 {beats}/min

## 2015-10-10 LAB — COMPREHENSIVE METABOLIC PANEL
ALT: 12 U/L — AB (ref 14–54)
AST: 23 U/L (ref 15–41)
Albumin: 3.7 g/dL (ref 3.5–5.0)
Alkaline Phosphatase: 70 U/L (ref 38–126)
Anion gap: 10 (ref 5–15)
BUN: 23 mg/dL — ABNORMAL HIGH (ref 6–20)
CHLORIDE: 102 mmol/L (ref 101–111)
CO2: 26 mmol/L (ref 22–32)
CREATININE: 1.37 mg/dL — AB (ref 0.44–1.00)
Calcium: 8.9 mg/dL (ref 8.9–10.3)
GFR calc non Af Amer: 36 mL/min — ABNORMAL LOW (ref >60)
GFR, EST AFRICAN AMERICAN: 42 mL/min — AB (ref >60)
Glucose, Bld: 129 mg/dL — ABNORMAL HIGH (ref 65–99)
Potassium: 3.8 mmol/L (ref 3.5–5.1)
SODIUM: 138 mmol/L (ref 135–145)
Total Bilirubin: 0.3 mg/dL (ref 0.3–1.2)
Total Protein: 7.8 g/dL (ref 6.5–8.1)

## 2015-10-10 LAB — GLUCOSE, CAPILLARY
GLUCOSE-CAPILLARY: 76 mg/dL (ref 65–99)
Glucose-Capillary: 125 mg/dL — ABNORMAL HIGH (ref 65–99)
Glucose-Capillary: 109 mg/dL — ABNORMAL HIGH (ref 65–99)
Glucose-Capillary: 162 mg/dL — ABNORMAL HIGH (ref 65–99)
Glucose-Capillary: 112 mg/dL — ABNORMAL HIGH (ref 65–99)

## 2015-10-10 LAB — CBC
HCT: 35.2 % — ABNORMAL LOW (ref 36.0–46.0)
HEMOGLOBIN: 11.7 g/dL — AB (ref 12.0–15.0)
MCH: 27.7 pg (ref 26.0–34.0)
MCHC: 33.2 g/dL (ref 30.0–36.0)
MCV: 83.2 fL (ref 78.0–100.0)
PLATELETS: 248 10*3/uL (ref 150–400)
RBC: 4.23 MIL/uL (ref 3.87–5.11)
RDW: 14.1 % (ref 11.5–15.5)
WBC: 5.8 10*3/uL (ref 4.0–10.5)

## 2015-10-10 MED ORDER — ENALAPRIL MALEATE 10 MG PO TABS: 10.0000 mg | ORAL_TABLET | Freq: Every day | ORAL | Status: DC

## 2015-10-10 MED ORDER — TECHNETIUM TC 99M SESTAMIBI - CARDIOLITE
30.0000 | Freq: Once | INTRAVENOUS | Status: AC | PRN
  Administered 2015-10-10: 10:00:00 30 via INTRAVENOUS

## 2015-10-10 MED ORDER — HYDRALAZINE HCL 20 MG/ML IJ SOLN: 10.0000 mg | Freq: Four times a day (QID) | INTRAMUSCULAR | Status: DC | PRN

## 2015-10-10 MED ORDER — TECHNETIUM TC 99M SESTAMIBI GENERIC - CARDIOLITE
30.0000 | Freq: Once | INTRAVENOUS | Status: AC | PRN
  Administered 2015-10-10: 30 via INTRAVENOUS

## 2015-10-10 MED ORDER — SODIUM CHLORIDE 0.9 % IV SOLN
INTRAVENOUS | Status: DC
  Administered 2015-10-10: 75 mL/h via INTRAVENOUS

## 2015-10-10 NOTE — Care Management (Signed)
CM spoke with patient. CM explained the call is to follow-up on questions she has about her insurance (per the CM consult). She does not have any questions. States she has made some calls. Reports she has spoken with the physician (presumably about her observation status). Presenter, broadcasting BSN CCM

## 2015-10-10 NOTE — Progress Notes (Signed)
  Echocardiogram 2D Echocardiogram has been performed.  Lysle Rubens 10/10/2015, 1:33 PM

## 2015-10-10 NOTE — Progress Notes (Addendum)
Consulting cardiologist: Dr. Darlina Guys  Seen for followup: Chest pain  Subjective:    Patient sitting in chair, no active chest pain or shortness of breath. Tolerating CPAP at night time.  Objective:   Temp:  [97.8 F (36.6 C)-98 F (36.7 C)] 97.8 F (36.6 C) (02/05 0444) Pulse Rate:  [68-96] 74 (02/05 0444) Resp:  [18] 18 (02/05 0444) BP: (123-152)/(60-92) 127/78 mmHg (02/05 0444) SpO2:  [97 %-100 %] 97 % (02/05 0444) Last BM Date: 10/09/15  Filed Weights   10/08/15 1304  Weight: 325 lb 6.4 oz (147.6 kg)    Intake/Output Summary (Last 24 hours) at 10/10/15 0805 Last data filed at 10/10/15 0400  Gross per 24 hour  Intake   1740 ml  Output      0 ml  Net   1740 ml    Telemetry: Sinus rhythm.  Exam:  General: Morbidly obese woman in no distress.  Lungs: Clear, nonlabored breathing.  Cardiac: RRR without gallop.  Abdomen: Obese, nontender.  Lab Results:  Basic Metabolic Panel:  Recent Labs Lab 10/08/15 0916 10/10/15 0536  NA 140 138  K 4.0 3.8  CL 101 102  CO2 29 26  GLUCOSE 113* 129*  BUN 30* 23*  CREATININE 1.63* 1.37*  CALCIUM 9.0 8.9    Liver Function Tests:  Recent Labs Lab 10/10/15 0536  AST 23  ALT 12*  ALKPHOS 70  BILITOT 0.3  PROT 7.8  ALBUMIN 3.7    CBC:  Recent Labs Lab 10/08/15 0916 10/10/15 0536  WBC 6.0 5.8  HGB 11.6* 11.7*  HCT 35.7* 35.2*  MCV 83.6 83.2  PLT 232 248    Cardiac Enzymes:  Recent Labs Lab 10/08/15 1902 10/08/15 2203 10/09/15 0100  TROPONINI <0.03 <0.03 <0.03    Medications:   Scheduled Medications: . aspirin EC  81 mg Oral Daily  . diltiazem  120 mg Oral Daily  . enoxaparin (LOVENOX) injection  40 mg Subcutaneous Q24H  . exenatide  10 mcg Subcutaneous BID WC  . ezetimibe  10 mg Oral QPM  . hydrochlorothiazide  50 mg Oral Daily  . insulin aspart  0-9 Units Subcutaneous TID WC  . insulin glargine  65 Units Subcutaneous QAC breakfast  . latanoprost  1 drop Left Eye QHS  .  oxybutynin  10 mg Oral Daily  . pantoprazole  40 mg Oral Daily  . pravastatin  40 mg Oral Daily     PRN Medications:  acetaminophen, ALPRAZolam, gi cocktail, morphine injection, ondansetron (ZOFRAN) IV   Assessment:   1. Chest pain with atypical features. Cardiac markers are normal. ECG with low voltage and left anterior fascicular block. She was seen by Dr. Angelena Form in consultation on February 3 with recommendation to proceed with a 2 day Myoview study, second portion of which should be obtained today. Echocardiogram was also ordered but has not yet been completed.  2. Essential hypertension, blood pressure is reasonably well controlled at this time.  3. Morbid obesity with sleep apnea on CPAP.  4. Type 2 diabetes mellitus, on insulin.   Plan/Discussion:    Patient currently on aspirin, diltiazem CD, Zetia, HCTZ, insulin and Pravachol. Await second portion of Myoview and final results. If this study is low risk with normal LVEF, would not anticipate further inpatient cardiac testing (could cancel echocardiogram if not yet done and LVEF normal by Myoview). If significant abnormalities are uncovered, our service can continue to follow with further recommendations.  Of note, patient had questions about current  observation status and Medicare payment. She stated that she had spoken with a hospital representative about this situation and had questions for her physician. I explained some generalities about this, but asked her to discuss the stiuation in more detail with her admitting provider. Our service is following her in consultation, and did not complete her admission orders or specify admission status.  Satira Sark, M.D., F.A.C.C.

## 2015-10-10 NOTE — Progress Notes (Signed)
Reviewed results of her stress test as below   1. No evidence for reversibility or ischemia. Fixed defects involving the inferior and lateral walls are suggestive for areas of infarct.  2. Mild hypokinesia.  3. Left ventricular ejection fraction is 45%.  4. Intermediate-risk stress test findings*.   Intermediate risk test findings of Myoview, results of 2-D echo also reviewed , EF of 60-65% with normal regional wall motion  Given discrepancy in findings, will wait for cardiology to make further recommendations and keep the patient  NPO after midnight for possible LHC if indicated

## 2015-10-10 NOTE — Discharge Summary (Signed)
Physician Discharge Summary  [name] MRN: [id number] DOB/AGE: May 26, 1936 80 y.o.  PCP: Antony Blackbird, MD   Admit date: 10/08/2015 Discharge date: 10/10/2015  Discharge Diagnoses:   Principal Problem:   Chest pain Active Problems:   Obesity   Sleep apnea   Carotid arterial disease (HCC)   Diabetes mellitus type II, non insulin dependent (HCC)   Pain in the chest    Follow-up recommendations Follow-up with PCP in 3-5 days , including all  additional recommended appointments as below Follow-up CBC, CMP in 3-5 days Patient advised to hold her ACE inhibitor until seen by her primary care provider, this may be may be resumed if her renal function is back to baseline     Medication List    TAKE these medications        aspirin EC 81 MG tablet  Take 81 mg by mouth daily.     diltiazem 120 MG 24 hr capsule  Commonly known as:  TIAZAC  Take 120 mg by mouth daily.     enalapril 10 MG tablet  Commonly known as:  VASOTEC  Take 1 tablet (10 mg total) by mouth daily.  Start taking on:  10/18/2015     exenatide 10 MCG/0.04ML Sopn injection  Commonly known as:  BYETTA  Inject 10 mcg into the skin 2 (two) times daily with a meal.     ezetimibe 10 MG tablet  Commonly known as:  ZETIA  Take 10 mg by mouth every evening.     hydrochlorothiazide 50 MG tablet  Commonly known as:  HYDRODIURIL  Take 50 mg by mouth daily.     insulin glargine 100 UNIT/ML injection  Commonly known as:  LANTUS  Inject 65 Units into the skin every morning.     latanoprost 0.005 % ophthalmic solution  Commonly known as:  XALATAN  Place 1 drop into the left eye at bedtime.     oxybutynin 10 MG 24 hr tablet  Commonly known as:  DITROPAN-XL  Take 10 mg by mouth daily.     pantoprazole 40 MG tablet  Commonly known as:  PROTONIX  Take 40 mg by mouth daily.     pravastatin 40 MG tablet  Commonly known as:  PRAVACHOL  Take 40 mg by mouth daily.         Discharge Condition: * Discharge  Instructions         Disposition: 01-Home or Self Care   Consults:  Cardiology     Significant Diagnostic Studies:  Dg Chest 2 View  10/08/2015  CLINICAL DATA:  Upper chest pain for few days EXAM: CHEST  2 VIEW COMPARISON:  10/13/2011 FINDINGS: The heart size and mediastinal contours are within normal limits. Both lungs are clear. The visualized skeletal structures show degenerative changes of the acromioclavicular joints bilaterally. IMPRESSION: No active cardiopulmonary disease. Electronically Signed   By: Inez Catalina M.D.   On: 10/08/2015 08:55        Filed Weights   10/08/15 1304  Weight: 147.6 kg (325 lb 6.4 oz)     Microbiology: No results found for this or any previous visit (from the past 240 hour(s)).     Blood Culture    Component Value Date/Time   SDES URINE, CATHETERIZED 09/11/2011 1525   SPECREQUEST ADDED 09/11/11 1654 09/11/2011 1525   CULT ESCHERICHIA COLI 09/11/2011 1525   REPTSTATUS 09/14/2011 FINAL 09/11/2011 1525      Labs: Results for orders placed or performed during the hospital encounter of  10/08/15 (from the past 48 hour(s))  Glucose, capillary     Status: Abnormal   Collection Time: 10/08/15  4:39 PM  Result Value Ref Range   Glucose-Capillary 110 (H) 65 - 99 mg/dL  Troponin I-serum (0, 3, 6 hours)     Status: None   Collection Time: 10/08/15  7:02 PM  Result Value Ref Range   Troponin I <0.03 <0.031 ng/mL    Comment:        NO INDICATION OF MYOCARDIAL INJURY.   Glucose, capillary     Status: Abnormal   Collection Time: 10/08/15  9:12 PM  Result Value Ref Range   Glucose-Capillary 113 (H) 65 - 99 mg/dL   Comment 1 Notify RN   Troponin I-serum (0, 3, 6 hours)     Status: None   Collection Time: 10/08/15 10:03 PM  Result Value Ref Range   Troponin I <0.03 <0.031 ng/mL    Comment:        NO INDICATION OF MYOCARDIAL INJURY.   Troponin I-serum (0, 3, 6 hours)     Status: None   Collection Time: 10/09/15  1:00 AM   Result Value Ref Range   Troponin I <0.03 <0.031 ng/mL    Comment:        NO INDICATION OF MYOCARDIAL INJURY.   Glucose, capillary     Status: Abnormal   Collection Time: 10/09/15 11:39 AM  Result Value Ref Range   Glucose-Capillary 149 (H) 65 - 99 mg/dL  Glucose, capillary     Status: Abnormal   Collection Time: 10/09/15  4:53 PM  Result Value Ref Range   Glucose-Capillary 142 (H) 65 - 99 mg/dL   Comment 1 Notify RN    Comment 2 Document in Chart   Glucose, capillary     Status: Abnormal   Collection Time: 10/09/15  9:36 PM  Result Value Ref Range   Glucose-Capillary 154 (H) 65 - 99 mg/dL   Comment 1 Notify RN   Comprehensive metabolic panel     Status: Abnormal   Collection Time: 10/10/15  5:36 AM  Result Value Ref Range   Sodium 138 135 - 145 mmol/L   Potassium 3.8 3.5 - 5.1 mmol/L   Chloride 102 101 - 111 mmol/L   CO2 26 22 - 32 mmol/L   Glucose, Bld 129 (H) 65 - 99 mg/dL   BUN 23 (H) 6 - 20 mg/dL   Creatinine, Ser 1.37 (H) 0.44 - 1.00 mg/dL   Calcium 8.9 8.9 - 10.3 mg/dL   Total Protein 7.8 6.5 - 8.1 g/dL   Albumin 3.7 3.5 - 5.0 g/dL   AST 23 15 - 41 U/L   ALT 12 (L) 14 - 54 U/L   Alkaline Phosphatase 70 38 - 126 U/L   Total Bilirubin 0.3 0.3 - 1.2 mg/dL   GFR calc non Af Amer 36 (L) >60 mL/min   GFR calc Af Amer 42 (L) >60 mL/min    Comment: (NOTE) The eGFR has been calculated using the CKD EPI equation. This calculation has not been validated in all clinical situations. eGFR's persistently <60 mL/min signify possible Chronic Kidney Disease.    Anion gap 10 5 - 15  CBC     Status: Abnormal   Collection Time: 10/10/15  5:36 AM  Result Value Ref Range   WBC 5.8 4.0 - 10.5 K/uL    Comment: WHITE COUNT CONFIRMED ON SMEAR   RBC 4.23 3.87 - 5.11 MIL/uL   Hemoglobin 11.7 (L) 12.0 -  15.0 g/dL   HCT 35.2 (L) 36.0 - 46.0 %   MCV 83.2 78.0 - 100.0 fL   MCH 27.7 26.0 - 34.0 pg   MCHC 33.2 30.0 - 36.0 g/dL   RDW 14.1 11.5 - 15.5 %   Platelets 248 150 - 400 K/uL   Glucose, capillary     Status: Abnormal   Collection Time: 10/10/15  5:37 AM  Result Value Ref Range   Glucose-Capillary 125 (H) 65 - 99 mg/dL   Comment 1 Notify RN   Glucose, capillary     Status: Abnormal   Collection Time: 10/10/15  7:52 AM  Result Value Ref Range   Glucose-Capillary 109 (H) 65 - 99 mg/dL     Lipid Panel  No results found for: CHOL, TRIG, HDL, CHOLHDL, VLDL, LDLCALC, LDLDIRECT   No results found for: HGBA1C   Lab Results  Component Value Date   CREATININE 1.37* 10/10/2015     HPI :*80yo F w/ PMH including HTN, HLD, T2DM, OSA, carotid artery disease who presents with chest pain. The patient reports that for the past several weeks she has had intermittent episodes of central chest pressure that occurs at rest or with exertion most commonly precipitated by anxiety and stress. Symptoms last only 5 minutes, only associated with headache, not associated with syncope near syncope palpitations or lightheadedness. She occasionally has pain down her bilateral arms but she is unsure whether it is related to her water aerobics activities. No recent fever, nausea, vomiting , diarrhea. Family history positive for both parents and 2 brothers who died of a heart attack. Initial troponin, EKG, chest x-ray negative , Creatinine 1.6 , baseline unknown , currently chest pain-free   HOSPITAL COURSE:    Chest pain -fairly atypical most likely related to anxiety, However patient's heart score is 6 with  risk factors for CAD including age, DM, HTN, HLD, known PAD Cardiology was consulted and they recommended 2 day  stress test  currently on aspirin, diltiazem CD, Zetia, HCTZ, insulin and Pravachol, negative cardiac enzymes, telemetry uneventful ECG with low voltage and left anterior fascicular block If LVEF is normal and images are negative for reversible ischemia, anticipate discharge home today    Obesity Body mass index is 57.66 kg/(m^2).    Sleep apnea- continue  cpap   Carotid arterial disease (HCC) -Continue aspirin , statin   Diabetes mellitus type II, non insulin dependent (HCC) -  hemoglobin A1c pending, continue Byetta, Lantus,   Hypertension continue diltiazem , HCTZ and hold lisinopril , this may be restarted by PCP if the renal function is back down to baseline  chronic kidney disease-stage 2-3-baseline creatinine unknown, Hold ACE inhibitor for now  Discharge Exam:  Blood pressure 127/78, pulse 74, temperature 97.8 F (36.6 C), temperature source Oral, resp. rate 18, height 5' 3" (1.6 m), weight 147.6 kg (325 lb 6.4 oz), SpO2 97 %.  General: Well developed, well nourished, NAD  HEENT: OP clear, mucus membranes moist  SKIN: warm, dry. No rashes.  Neuro: No focal deficits  Musculoskeletal: Muscle strength 5/5 all ext  Psychiatric: Mood and affect normal  Neck: No JVD, no carotid bruits, no thyromegaly, no lymphadenopathy.  Lungs:Clear bilaterally, no wheezes, rhonci, crackles  Cardiovascular: Regular rate and rhythm. No murmurs, gallops or rubs        Follow-up Information    Follow up with FULP, CAMMIE, MD. Schedule an appointment as soon as possible for a visit in 3 days.   Specialty:  Family Medicine  Contact information:   [address] [address] [phone]       Signed: Reyne Dumas 10/10/2015, 9:57 AM        Time spent >45 mins

## 2015-10-11 DIAGNOSIS — N179 Acute kidney failure, unspecified: Secondary | ICD-10-CM | POA: Diagnosis not present

## 2015-10-11 DIAGNOSIS — I4729 Other ventricular tachycardia: Secondary | ICD-10-CM

## 2015-10-11 DIAGNOSIS — N183 Chronic kidney disease, stage 3 unspecified: Secondary | ICD-10-CM

## 2015-10-11 DIAGNOSIS — I119 Hypertensive heart disease without heart failure: Secondary | ICD-10-CM

## 2015-10-11 DIAGNOSIS — I472 Ventricular tachycardia: Secondary | ICD-10-CM

## 2015-10-11 DIAGNOSIS — I779 Disorder of arteries and arterioles, unspecified: Secondary | ICD-10-CM | POA: Diagnosis not present

## 2015-10-11 DIAGNOSIS — I10 Essential (primary) hypertension: Secondary | ICD-10-CM

## 2015-10-11 DIAGNOSIS — R072 Precordial pain: Secondary | ICD-10-CM | POA: Diagnosis not present

## 2015-10-11 DIAGNOSIS — N184 Chronic kidney disease, stage 4 (severe): Secondary | ICD-10-CM

## 2015-10-11 LAB — BASIC METABOLIC PANEL
Anion gap: 14 (ref 5–15)
BUN: 21 mg/dL — ABNORMAL HIGH (ref 6–20)
CALCIUM: 9.6 mg/dL (ref 8.9–10.3)
CO2: 25 mmol/L (ref 22–32)
CREATININE: 1.29 mg/dL — AB (ref 0.44–1.00)
Chloride: 104 mmol/L (ref 101–111)
GFR calc non Af Amer: 39 mL/min — ABNORMAL LOW (ref >60)
GFR, EST AFRICAN AMERICAN: 45 mL/min — AB (ref >60)
GLUCOSE: 101 mg/dL — AB (ref 65–99)
Potassium: 4.1 mmol/L (ref 3.5–5.1)
Sodium: 143 mmol/L (ref 135–145)

## 2015-10-11 LAB — GLUCOSE, CAPILLARY
GLUCOSE-CAPILLARY: 72 mg/dL (ref 65–99)
GLUCOSE-CAPILLARY: 122 mg/dL — AB (ref 65–99)
Glucose-Capillary: 129 mg/dL — ABNORMAL HIGH (ref 65–99)
Glucose-Capillary: 113 mg/dL — ABNORMAL HIGH (ref 65–99)
Glucose-Capillary: 142 mg/dL — ABNORMAL HIGH (ref 65–99)
Glucose-Capillary: 54 mg/dL — ABNORMAL LOW (ref 65–99)
Glucose-Capillary: 114 mg/dL — ABNORMAL HIGH (ref 65–99)

## 2015-10-11 LAB — HEMOGLOBIN A1C
Hgb A1c MFr Bld: 6.6 % — ABNORMAL HIGH (ref 4.8–5.6)
Mean Plasma Glucose: 143 mg/dL

## 2015-10-11 LAB — MAGNESIUM: Magnesium: 1.9 mg/dL (ref 1.7–2.4)

## 2015-10-11 MED ORDER — INSULIN GLARGINE 100 UNIT/ML ~~LOC~~ SOLN
30.0000 [IU] | Freq: Every day | SUBCUTANEOUS | Status: DC
  Administered 2015-10-12: 30 [IU] via SUBCUTANEOUS
  Filled 2015-10-11: qty 0.3

## 2015-10-11 MED ORDER — ASPIRIN 81 MG PO CHEW
81.0000 mg | CHEWABLE_TABLET | ORAL | Status: AC
  Administered 2015-10-12: 81 mg via ORAL
  Filled 2015-10-11: qty 1

## 2015-10-11 MED ORDER — SODIUM CHLORIDE 0.9 % IV SOLN
INTRAVENOUS | Status: DC
  Administered 2015-10-11 – 2015-10-12 (×3): via INTRAVENOUS

## 2015-10-11 NOTE — Progress Notes (Signed)
Patient: Jeanne [name] / Admit Date: 10/08/2015 / Date of Encounter: 10/11/2015, 8:40 AM   Subjective: Feeling well without complaint. When reviewing recent symptoms, she describes chest pain every other day recently - mostly when laying down, increased with stress. She was able to do water aerobics last week without any anginal symptoms.   Objective: Telemetry: NSR, SB upper 40s while sleeping, 4 beat NSVT yesterday Physical Exam: Blood pressure 135/60, pulse 70, temperature 97.9 F (36.6 C), temperature source Oral, resp. rate 20, height 5\' 3"  (1.6 m), weight 325 lb 6.4 oz (147.6 kg), SpO2 98 %. General: Well developed obese AAF in no acute distress. Head: Normocephalic, atraumatic, sclera non-icteric, no xanthomas, nares are without discharge. Neck: Negative for carotid bruits. JVP not elevated. Lungs: Clear bilaterally to auscultation without wheezes, rales, or rhonchi. Breathing is unlabored. Heart: RRR S1 S2 without murmurs, rubs, or gallops.  Abdomen: Soft, non-tender, non-distended with normoactive bowel sounds. No rebound/guarding. Extremities: No clubbing or cyanosis. No edema. Distal pedal pulses are 2+ and equal bilaterally. Neuro: Alert and oriented X 3. Moves all extremities spontaneously. Psych:  Responds to questions appropriately with a normal affect.   Intake/Output Summary (Last 24 hours) at 10/11/15 0840 Last data filed at 10/11/15 0634  Gross per 24 hour  Intake   1570 ml  Output      0 ml  Net   1570 ml    Inpatient Medications:  . aspirin EC  81 mg Oral Daily  . diltiazem  120 mg Oral Daily  . enoxaparin (LOVENOX) injection  40 mg Subcutaneous Q24H  . exenatide  10 mcg Subcutaneous BID WC  . ezetimibe  10 mg Oral QPM  . insulin aspart  0-9 Units Subcutaneous TID WC  . insulin glargine  65 Units Subcutaneous QAC breakfast  . latanoprost  1 drop Left Eye QHS  . oxybutynin  10 mg Oral Daily  . pantoprazole  40 mg Oral Daily  . pravastatin  40 mg Oral  Daily   Infusions:    Labs:  Recent Labs  10/08/15 0916 10/10/15 0536  NA 140 138  K 4.0 3.8  CL 101 102  CO2 29 26  GLUCOSE 113* 129*  BUN 30* 23*  CREATININE 1.63* 1.37*  CALCIUM 9.0 8.9    Recent Labs  10/10/15 0536  AST 23  ALT 12*  ALKPHOS 70  BILITOT 0.3  PROT 7.8  ALBUMIN 3.7    Recent Labs  10/08/15 0916 10/10/15 0536  WBC 6.0 5.8  HGB 11.6* 11.7*  HCT 35.7* 35.2*  MCV 83.6 83.2  PLT 232 248    Recent Labs  10/08/15 1902 10/08/15 2203 10/09/15 0100  TROPONINI <0.03 <0.03 <0.03   Invalid input(s): POCBNP No results for input(s): HGBA1C in the last 72 hours.   Radiology/Studies:  Dg Chest 2 View  10/08/2015  CLINICAL DATA:  Upper chest pain for few days EXAM: CHEST  2 VIEW COMPARISON:  10/13/2011 FINDINGS: The heart size and mediastinal contours are within normal limits. Both lungs are clear. The visualized skeletal structures show degenerative changes of the acromioclavicular joints bilaterally. IMPRESSION: No active cardiopulmonary disease. Electronically Signed   By: Inez Catalina M.D.   On: 10/08/2015 08:55   Nm Myocar Multi W/spect W/wall Motion / Ef  10/10/2015  CLINICAL DATA:  80 year old with precordial chest pain. EXAM: MYOCARDIAL IMAGING WITH SPECT (REST AND PHARMACOLOGIC-STRESS - 2 DAY PROTOCOL) GATED LEFT VENTRICULAR WALL MOTION STUDY LEFT VENTRICULAR EJECTION FRACTION TECHNIQUE: Standard myocardial SPECT imaging  was performed after resting intravenous injection of 30 mCi Tc-38m sestamibi. Subsequently, on a second day, intravenous infusion of Lexiscan was performed under the supervision of the Cardiology staff. At peak effect of the drug, 30 mCi Tc-32m sestamibi was injected intravenously and standard myocardial SPECT imaging was performed. Quantitative gated imaging was also performed to evaluate left ventricular wall motion, and estimate left ventricular ejection fraction. COMPARISON:  Report from 07/03/2002 FINDINGS: Perfusion: There is a  large fixed defect involving the lateral wall compatible with an infarct. There is also decreased uptake in the inferior wall on both the rest and stress images. There is no evidence for reversibility or ischemia. Wall Motion: Very mild hypokinesia along the inferior wall, lateral wall and apex. Left Ventricular Ejection Fraction: 45 % End diastolic volume [id number] ml End systolic volume 60 ml IMPRESSION: 1. No evidence for reversibility or ischemia. Fixed defects involving the inferior and lateral walls are suggestive for areas of infarct. 2.  Mild hypokinesia. 3. Left ventricular ejection fraction is 45%. 4. Intermediate-risk stress test findings*. *2012 Appropriate Use Criteria for Coronary Revascularization Focused Update: J Am Coll Cardiol. N6492421. http://content.[email or web address].aspx?[email or web address] Electronically Signed   By: Markus Daft M.D.   On: 10/10/2015 14:09     Assessment and Plan  80F with HTN, DM, reported carotid artery disease (duplex 2014: mild plaque, no sig stenosis), OSA, morbid obesity, HLD, GERD presented to [facility] with mixed atypical/typical CP and renal insufficiency of unknown duration . She r/o for MI. 2D Echo 10/10/15: EF 60-65%, grade 1 DD, upper normal left atrial size, moderate posterior MAC with trivial MR, reduced RV contraction, trivial pericardial effusion. Nuc 2/4-10/10/15: no evidence for reversibility or ischemia, fixed defects involving the inferior and lateral walls are suggestive for areas of infarct, mild HK, EF 45%.  1. Chest pain - mixed atypical/typical features. Nuclear stress test results reviewed with Dr. Stanford Breed. Given multiple risk factors, would recommend cardiac cath for further evaluation. Risks and benefits of cardiac catheterization have been discussed with the patient.  These include bleeding, infection, kidney damage, stroke, heart attack, death. The patient understands these risks and is willing to proceed. There is no room on the cath board  for today thus will plan on proceeding tomorrow. (No LV gram given renal insufficiency). Will also trend renal function and gently hydrate starting this evening. Check lipid panel tomorrow and titrate statin if indicated.  2. Essential HTN - controlled for the time being but ultimately may need substitute for the HCTZ which has held due to #3.  3. Renal insufficiency of unknown chronicity, probably CKD stage III - patient reports her DM doctor has previously told her that her kidneys are functioning at "46%" (?GFR 46). Further per IM  (HCTZ on hold).  4. 4 beats NSVT - check Mg, K.  5. DM - pre-cath orders include order to give 1/2 dose basal insulin in AM tomorrow.   Signed, Melina Copa PA-C Pager: [phone]  As above, patient seen and examined.She denies chest pain or dyspnea. Results of nuclear study noted with fixed defects involving the inferior and lateral walls suggesting prior infarct. Given multiple risk factors and recurrent symptoms feel definitive evaluation is indicated.Plan cardiac catheterization tomorrow. The risks and benefits were discussed and the patient agrees to proceed. Kirk Ruths

## 2015-10-11 NOTE — Progress Notes (Addendum)
[facility] PROGRESS NOTE  Jeanne [name] [id number] DOB: 1935/10/13 DOA: 10/08/2015 PCP: Antony Blackbird, MD  Length of stay:    Assessment/Plan: Principal Problem:   Chest pain Active Problems:   Obesity   Sleep apnea   Carotid arterial disease ([place])   Diabetes mellitus type II, non insulin dependent (HCC)   Pain in the chest   NSVT (nonsustained ventricular tachycardia) (HCC)   Essential hypertension   CKD (chronic kidney disease), stage III     HPI :*80yo F w/ PMH including HTN, HLD, T2DM, OSA, carotid artery disease who presents with chest pain. The patient reports that for the past several weeks she has had intermittent episodes of central chest pressure that occurs at rest or with exertion most commonly precipitated by anxiety and stress. Symptoms last only 5 minutes, only associated with headache, not associated with syncope near syncope palpitations or lightheadedness. She occasionally has pain down her bilateral arms but she is unsure whether it is related to her water aerobics activities. No recent fever, nausea, vomiting , diarrhea. Family history positive for both parents and 2 brothers who died of a heart attack. Initial troponin, EKG, chest x-ray negative , Creatinine 1.6 , baseline unknown , currently chest pain-free,Nuc 2/4-10/10/15: no evidence for reversibility or ischemia, fixed defects involving the inferior and lateral walls are suggestive for areas of infarct, mild HK, EF 45%.   HOSPITAL COURSE:    Chest pain - mixed atypical/typical features. Nuclear stress test results reviewed by Dr. Stanford Breed. Given multiple risk factors, cards  recommend cardiac cath for further evaluation, to be done in am    heart score is 6 with risk factors for CAD including age, DM, HTN, HLD, known PAD currently on aspirin, diltiazem CD, Zetia, HCTZ, insulin and Pravachol, negative cardiac enzymes, telemetry uneventful ECG with low voltage and left anterior fascicular  block If LVEF is normal and images are negative for reversible ischemia, anticipate discharge home today    Obesity Body mass index is 57.66 kg/(m^2).    Sleep apnea- continue cpap   Carotid arterial disease (HCC) -Continue aspirin , statin   Diabetes mellitus type II, non insulin dependent (HCC) - hemoglobin A1c pending, continue [place], Lantus, change dose from 65 to 30 units in am  as pt will be NPO   Hypertension continue diltiazem , HCTZ and hold lisinopril , this may be restarted by PCP if the renal function is back down to baseline  chronic kidney disease-stage 2-3-baseline creatinine unknown, Hold ACE inhibitor for now,recheck in am , hydrated with IVF in anticipation of cath    DVT prophylaxsis lovenox   Code Status:      Code Status Orders        Start     Ordered   10/08/15 1213  Full code   Continuous     10/08/15 1214     Family Communication: Discussed in detail with the patient, all imaging results, lab results explained to the patient   Disposition Plan:  Cath in am      Consultants:  cardiology  Procedures:  Stress test   Antibiotics: Anti-infectives    None         HPI/Subjective: No CP, NO SOB   Objective: Filed Vitals:   10/10/15 1500 10/10/15 2100 10/10/15 2251 10/11/15 0639  BP: 123/61  131/60 135/60  Pulse: 75 80 65 70  Temp: 97.8 F (36.6 C)  98.4 F (36.9 C) 97.9 F (36.6 C)  TempSrc:  Oral  Oral Oral  Resp: 18 18 20 20   Height:      Weight:      SpO2: 98% 97% 98% 98%    Intake/Output Summary (Last 24 hours) at 10/11/15 [id number] Last data filed at 10/11/15 [id number]  Gross per 24 hour  Intake   1570 ml  Output      0 ml  Net   1570 ml    Exam:  General: No acute respiratory distress Lungs: Clear to auscultation bilaterally without wheezes or crackles Cardiovascular: Regular rate and rhythm without murmur gallop or rub normal S1 and S2 Abdomen: Nontender, nondistended, soft, bowel sounds positive, no  rebound, no ascites, no appreciable mass Extremities: No significant cyanosis, clubbing, or edema bilateral lower extremities     Data Review   Micro Results No results found for this or any previous visit (from the past 240 hour(s)).  Radiology Reports Dg Chest 2 View  10/08/2015  CLINICAL DATA:  Upper chest pain for few days EXAM: CHEST  2 VIEW COMPARISON:  10/13/2011 FINDINGS: The heart size and mediastinal contours are within normal limits. Both lungs are clear. The visualized skeletal structures show degenerative changes of the acromioclavicular joints bilaterally. IMPRESSION: No active cardiopulmonary disease. Electronically Signed   By: Inez Catalina M.D.   On: 10/08/2015 08:55   Nm Myocar Multi W/spect W/wall Motion / Ef  10/10/2015  CLINICAL DATA:  80 year old with precordial chest pain. EXAM: MYOCARDIAL IMAGING WITH SPECT (REST AND PHARMACOLOGIC-STRESS - 2 DAY PROTOCOL) GATED LEFT VENTRICULAR WALL MOTION STUDY LEFT VENTRICULAR EJECTION FRACTION TECHNIQUE: Standard myocardial SPECT imaging was performed after resting intravenous injection of 30 mCi Tc-70m sestamibi. Subsequently, on a second day, intravenous infusion of Lexiscan was performed under the supervision of the Cardiology staff. At peak effect of the drug, 30 mCi Tc-63m sestamibi was injected intravenously and standard myocardial SPECT imaging was performed. Quantitative gated imaging was also performed to evaluate left ventricular wall motion, and estimate left ventricular ejection fraction. COMPARISON:  Report from 07/03/2002 FINDINGS: Perfusion: There is a large fixed defect involving the lateral wall compatible with an infarct. There is also decreased uptake in the inferior wall on both the rest and stress images. There is no evidence for reversibility or ischemia. Wall Motion: Very mild hypokinesia along the inferior wall, lateral wall and apex. Left Ventricular Ejection Fraction: 45 % End diastolic volume [id number] ml End systolic  volume 60 ml IMPRESSION: 1. No evidence for reversibility or ischemia. Fixed defects involving the inferior and lateral walls are suggestive for areas of infarct. 2.  Mild hypokinesia. 3. Left ventricular ejection fraction is 45%. 4. Intermediate-risk stress test findings*. *2012 Appropriate Use Criteria for Coronary Revascularization Focused Update: J Am Coll Cardiol. [id number]. http://content.[email or web address]?[email or web address] Electronically Signed   By: Markus Daft M.D.   On: 10/10/2015 14:09     CBC  Recent Labs Lab 10/08/15 0916 10/10/15 0536  WBC 6.0 5.8  HGB 11.6* 11.7*  HCT 35.7* 35.2*  PLT 232 248  MCV 83.6 83.2  MCH 27.2 27.7  MCHC 32.5 33.2  RDW 14.0 14.1    Chemistries   Recent Labs Lab 10/08/15 0916 10/10/15 0536  NA 140 138  K 4.0 3.8  CL 101 102  CO2 29 26  GLUCOSE 113* 129*  BUN 30* 23*  CREATININE 1.63* 1.37*  CALCIUM 9.0 8.9  AST  --  23  ALT  --  12*  ALKPHOS  --  70  BILITOT  --  0.3   ------------------------------------------------------------------------------------------------------------------  estimated creatinine clearance is 49.1 mL/min (by C-G formula based on Cr of 1.37). ------------------------------------------------------------------------------------------------------------------ No results for input(s): HGBA1C in the last 72 hours. ------------------------------------------------------------------------------------------------------------------ No results for input(s): CHOL, HDL, LDLCALC, TRIG, CHOLHDL, LDLDIRECT in the last 72 hours. ------------------------------------------------------------------------------------------------------------------ No results for input(s): TSH, T4TOTAL, T3FREE, THYROIDAB in the last 72 hours.  Invalid input(s): FREET3 ------------------------------------------------------------------------------------------------------------------ No results for input(s): VITAMINB12, FOLATE,  FERRITIN, TIBC, IRON, RETICCTPCT in the last 72 hours.  Coagulation profile No results for input(s): INR, PROTIME in the last 168 hours.  No results for input(s): DDIMER in the last 72 hours.  Cardiac Enzymes  Recent Labs Lab 10/08/15 1902 10/08/15 2203 10/09/15 0100  TROPONINI <0.03 <0.03 <0.03   ------------------------------------------------------------------------------------------------------------------ Invalid input(s): POCBNP   CBG:  Recent Labs Lab 10/10/15 1240 10/10/15 1645 10/10/15 2249 10/11/15 0637 10/11/15 0742  GLUCAP 76 162* 112* 129* 113*       Studies: Nm Myocar Multi W/spect W/wall Motion / Ef  10/10/2015  CLINICAL DATA:  80 year old with precordial chest pain. EXAM: MYOCARDIAL IMAGING WITH SPECT (REST AND PHARMACOLOGIC-STRESS - 2 DAY PROTOCOL) GATED LEFT VENTRICULAR WALL MOTION STUDY LEFT VENTRICULAR EJECTION FRACTION TECHNIQUE: Standard myocardial SPECT imaging was performed after resting intravenous injection of 30 mCi Tc-60m sestamibi. Subsequently, on a second day, intravenous infusion of Lexiscan was performed under the supervision of the Cardiology staff. At peak effect of the drug, 30 mCi Tc-24m sestamibi was injected intravenously and standard myocardial SPECT imaging was performed. Quantitative gated imaging was also performed to evaluate left ventricular wall motion, and estimate left ventricular ejection fraction. COMPARISON:  Report from 07/03/2002 FINDINGS: Perfusion: There is a large fixed defect involving the lateral wall compatible with an infarct. There is also decreased uptake in the inferior wall on both the rest and stress images. There is no evidence for reversibility or ischemia. Wall Motion: Very mild hypokinesia along the inferior wall, lateral wall and apex. Left Ventricular Ejection Fraction: 45 % End diastolic volume [id number] ml End systolic volume 60 ml IMPRESSION: 1. No evidence for reversibility or ischemia. Fixed defects involving  the inferior and lateral walls are suggestive for areas of infarct. 2.  Mild hypokinesia. 3. Left ventricular ejection fraction is 45%. 4. Intermediate-risk stress test findings*. *2012 Appropriate Use Criteria for Coronary Revascularization Focused Update: J Am Coll Cardiol. [id number]. http://content.[email or web address].aspx?[email or web address] Electronically Signed   By: Markus Daft M.D.   On: 10/10/2015 14:09      No results found for: HGBA1C Lab Results  Component Value Date   CREATININE 1.37* 10/10/2015       Scheduled Meds: . [START ON 10/12/2015] aspirin  81 mg Oral Pre-Cath  . aspirin EC  81 mg Oral Daily  . diltiazem  120 mg Oral Daily  . enoxaparin (LOVENOX) injection  40 mg Subcutaneous Q24H  . exenatide  10 mcg Subcutaneous BID WC  . ezetimibe  10 mg Oral QPM  . insulin aspart  0-9 Units Subcutaneous TID WC  . insulin glargine  65 Units Subcutaneous QAC breakfast  . latanoprost  1 drop Left Eye QHS  . oxybutynin  10 mg Oral Daily  . pantoprazole  40 mg Oral Daily  . pravastatin  40 mg Oral Daily   Continuous Infusions: . sodium chloride      Principal Problem:   Chest pain Active Problems:   Obesity   Sleep apnea   Carotid arterial disease (HCC)   Diabetes mellitus type II, non insulin dependent (HCC)   Pain in the chest   NSVT (nonsustained  ventricular tachycardia) (HCC)   Essential hypertension   CKD (chronic kidney disease), stage III    Time spent: 45 minutes   [facility] Pager [phone]. If 7PM-7AM, please contact night-coverage at [email or web address], password [facility] 10/11/2015, 9:37 AM

## 2015-10-11 NOTE — Progress Notes (Signed)
CPAP humidifier filled, pt. Able to place on herself, made aware to notify if help needed.

## 2015-10-11 NOTE — Progress Notes (Addendum)
Hypoglycemic Event  CBG: 54  Treatment: 15 GM carbohydrate snack  Symptoms: Pale, Sweaty and Hungry  Follow-up CBG: [id number] CBG Result:72  Possible Reasons for Event: Unknown  Comments/MD notified: Dr. Allyson Sabal was paged.   Patient eating her dinner in between CBG checks.  Will recheck her CBG again in 15 minutes to reassess since it is still borderline hypoglycemic.  Patient did state she was feeling much better.   Patient's CBG was checked a 3rd time and was 122.    No new orders received from MD at this time  Elias Bordner, [place]

## 2015-10-11 NOTE — Consult Note (Signed)
CSW received consult from pt RN, Ramiro Harvest, that pt is requesting to speak with a SW. RN reported she did not know the reason for the consult.  BSW Intern met with pt at bedside and introduced self. Pt proceeded to explain interest in resources for family counseling. Pt explained her family is in need of family counseling. Pt stated she preferred a counseling in contract with her insurance, Medicare. Pt would also prefer home visit(s) if possible due to her husband not being able to transport easily. Pt inquired about "[facility]" or "[place]," which was referred to her previously. BSW Intern explained speaking with supervisor and gathering resources to provide to pt shortly.   BSW Intern and CSW then researched family counseling resources in the area. BSW Intern met with pt at bedside again to provide resources. Pt daughter also at bedside. BSW Intern provided the contact information to [facility], [employer] of the [place], and a list of counselors and facilities.  Pt thanked BSW Intern for the help and pt grateful for the resources. Pt inquired about BSW Intern supervisors name and number incase she will need further help. BSW Intern provided supervisor, Denice Bors Harrison's name and phone number to pt.   No further SW needs identified at this time.  BSW Intern signing off, Harlon Flor, [place] Work Department  [phone]

## 2015-10-12 ENCOUNTER — Encounter (HOSPITAL_COMMUNITY)
Admission: EM | Disposition: A | Discharge status: Home / Self Care | Payer: Self-pay | Source: Home / Self Care | Attending: Internal Medicine

## 2015-10-12 DIAGNOSIS — R072 Precordial pain: Secondary | ICD-10-CM | POA: Diagnosis not present

## 2015-10-12 DIAGNOSIS — N179 Acute kidney failure, unspecified: Secondary | ICD-10-CM | POA: Diagnosis not present

## 2015-10-12 DIAGNOSIS — R9439 Abnormal result of other cardiovascular function study: Secondary | ICD-10-CM | POA: Diagnosis present

## 2015-10-12 DIAGNOSIS — I200 Unstable angina: Secondary | ICD-10-CM | POA: Diagnosis present

## 2015-10-12 DIAGNOSIS — I2510 Atherosclerotic heart disease of native coronary artery without angina pectoris: Secondary | ICD-10-CM

## 2015-10-12 DIAGNOSIS — N183 Chronic kidney disease, stage 3 (moderate): Secondary | ICD-10-CM | POA: Diagnosis not present

## 2015-10-12 DIAGNOSIS — I779 Disorder of arteries and arterioles, unspecified: Secondary | ICD-10-CM | POA: Diagnosis not present

## 2015-10-12 HISTORY — PX: CARDIAC CATHETERIZATION: SHX172

## 2015-10-12 LAB — PROTIME-INR
INR: 1.01 (ref 0.00–1.49)
Prothrombin Time: 13.5 seconds (ref 11.6–15.2)

## 2015-10-12 LAB — GLUCOSE, CAPILLARY
Glucose-Capillary: 112 mg/dL — ABNORMAL HIGH (ref 65–99)
Glucose-Capillary: 58 mg/dL — ABNORMAL LOW (ref 65–99)

## 2015-10-12 LAB — CBC
HCT: 34.9 % — ABNORMAL LOW (ref 36.0–46.0)
Hemoglobin: 10.8 g/dL — ABNORMAL LOW (ref 12.0–15.0)
MCH: 26.7 pg (ref 26.0–34.0)
MCHC: 30.9 g/dL (ref 30.0–36.0)
MCV: 86.2 fL (ref 78.0–100.0)
Platelets: 219 10*3/uL (ref 150–400)
RBC: 4.05 MIL/uL (ref 3.87–5.11)
RDW: 14.3 % (ref 11.5–15.5)
WBC: 5.6 10*3/uL (ref 4.0–10.5)

## 2015-10-12 LAB — BASIC METABOLIC PANEL
ANION GAP: 11 (ref 5–15)
BUN: 22 mg/dL — ABNORMAL HIGH (ref 6–20)
CO2: 27 mmol/L (ref 22–32)
Calcium: 9.2 mg/dL (ref 8.9–10.3)
Chloride: 104 mmol/L (ref 101–111)
Creatinine, Ser: 1.23 mg/dL — ABNORMAL HIGH (ref 0.44–1.00)
GFR calc Af Amer: 48 mL/min — ABNORMAL LOW (ref >60)
GFR, EST NON AFRICAN AMERICAN: 41 mL/min — AB (ref >60)
GLUCOSE: 169 mg/dL — AB (ref 65–99)
POTASSIUM: 3.9 mmol/L (ref 3.5–5.1)
Sodium: 142 mmol/L (ref 135–145)

## 2015-10-12 LAB — LIPID PANEL
CHOL/HDL RATIO: 3.2 ratio
CHOLESTEROL: 90 mg/dL (ref 0–200)
HDL: 28 mg/dL — AB (ref >40)
LDL Cholesterol: 35 mg/dL (ref 0–99)
TRIGLYCERIDES: 134 mg/dL (ref <150)
VLDL: 27 mg/dL (ref 0–40)

## 2015-10-12 SURGERY — LEFT HEART CATH AND CORONARY ANGIOGRAPHY

## 2015-10-12 MED ORDER — FENTANYL CITRATE (PF) 100 MCG/2ML IJ SOLN
INTRAMUSCULAR | Status: AC
  Filled 2015-10-12: qty 2

## 2015-10-12 MED ORDER — HEPARIN SODIUM (PORCINE) 1000 UNIT/ML IJ SOLN
INTRAMUSCULAR | Status: AC
  Filled 2015-10-12: qty 1

## 2015-10-12 MED ORDER — VERAPAMIL HCL 2.5 MG/ML IV SOLN
INTRAVENOUS | Status: AC
  Filled 2015-10-12: qty 2

## 2015-10-12 MED ORDER — SALINE SPRAY 0.65 % NA SOLN
1.0000 | NASAL | Status: DC | PRN
  Filled 2015-10-12: qty 44

## 2015-10-12 MED ORDER — IOHEXOL 350 MG/ML SOLN
INTRAVENOUS | Status: DC | PRN
  Administered 2015-10-12: 90 mL via INTRA_ARTERIAL

## 2015-10-12 MED ORDER — MIDAZOLAM HCL 2 MG/2ML IJ SOLN
INTRAMUSCULAR | Status: DC | PRN
  Administered 2015-10-12: 1 mg via INTRAVENOUS

## 2015-10-12 MED ORDER — LIDOCAINE HCL (PF) 1 % IJ SOLN
INTRAMUSCULAR | Status: DC | PRN
  Administered 2015-10-12: 12:00:00

## 2015-10-12 MED ORDER — VERAPAMIL HCL 2.5 MG/ML IV SOLN
INTRAVENOUS | Status: DC | PRN
  Administered 2015-10-12: 12:00:00 via INTRA_ARTERIAL

## 2015-10-12 MED ORDER — FENTANYL CITRATE (PF) 100 MCG/2ML IJ SOLN
INTRAMUSCULAR | Status: DC | PRN
  Administered 2015-10-12: 50 ug via INTRAVENOUS

## 2015-10-12 MED ORDER — HEPARIN SODIUM (PORCINE) 1000 UNIT/ML IJ SOLN
INTRAMUSCULAR | Status: DC | PRN
  Administered 2015-10-12: 5000 [IU] via INTRAVENOUS

## 2015-10-12 MED ORDER — MIDAZOLAM HCL 2 MG/2ML IJ SOLN
INTRAMUSCULAR | Status: AC
  Filled 2015-10-12: qty 2

## 2015-10-12 MED ORDER — LIDOCAINE HCL (PF) 1 % IJ SOLN
INTRAMUSCULAR | Status: AC
  Filled 2015-10-12: qty 30

## 2015-10-12 MED ORDER — SODIUM CHLORIDE 0.9 % WEIGHT BASED INFUSION: 3.0000 mL/kg/h | INTRAVENOUS | Status: AC

## 2015-10-12 SURGICAL SUPPLY — 10 items
CATH INFINITI 5 FR JL3.5 (CATHETERS) ×2 IMPLANT
CATH INFINITI 5 FR MPA2 (CATHETERS) ×1 IMPLANT
CATH INFINITI JR4 5F (CATHETERS) ×2 IMPLANT
DEVICE RAD COMP TR BAND LRG (VASCULAR PRODUCTS) ×2 IMPLANT
GLIDESHEATH SLEND A-KIT 6F 22G (SHEATH) ×2 IMPLANT
KIT HEART LEFT (KITS) ×2 IMPLANT
PACK CARDIAC CATHETERIZATION (CUSTOM PROCEDURE TRAY) ×2 IMPLANT
TRANSDUCER W/STOPCOCK (MISCELLANEOUS) ×2 IMPLANT
TUBING CIL FLEX 10 FLL-RA (TUBING) ×2 IMPLANT
WIRE SAFE-T 1.5MM-J .035X260CM (WIRE) ×2 IMPLANT

## 2015-10-12 NOTE — H&P (View-Only) (Signed)
Patient: Jeanne [name] / Admit Date: 10/08/2015 / Date of Encounter: 10/11/2015, 8:40 AM   Subjective: Feeling well without complaint. When reviewing recent symptoms, she describes chest pain every other day recently - mostly when laying down, increased with stress. She was able to do water aerobics last week without any anginal symptoms.   Objective: Telemetry: NSR, SB upper 40s while sleeping, 4 beat NSVT yesterday Physical Exam: Blood pressure 135/60, pulse 70, temperature 97.9 F (36.6 C), temperature source Oral, resp. rate 20, height 5\' 3"  (1.6 m), weight 325 lb 6.4 oz (147.6 kg), SpO2 98 %. General: Well developed obese AAF in no acute distress. Head: Normocephalic, atraumatic, sclera non-icteric, no xanthomas, nares are without discharge. Neck: Negative for carotid bruits. JVP not elevated. Lungs: Clear bilaterally to auscultation without wheezes, rales, or rhonchi. Breathing is unlabored. Heart: RRR S1 S2 without murmurs, rubs, or gallops.  Abdomen: Soft, non-tender, non-distended with normoactive bowel sounds. No rebound/guarding. Extremities: No clubbing or cyanosis. No edema. Distal pedal pulses are 2+ and equal bilaterally. Neuro: Alert and oriented X 3. Moves all extremities spontaneously. Psych:  Responds to questions appropriately with a normal affect.   Intake/Output Summary (Last 24 hours) at 10/11/15 0840 Last data filed at 10/11/15 0634  Gross per 24 hour  Intake   1570 ml  Output      0 ml  Net   1570 ml    Inpatient Medications:  . aspirin EC  81 mg Oral Daily  . diltiazem  120 mg Oral Daily  . enoxaparin (LOVENOX) injection  40 mg Subcutaneous Q24H  . exenatide  10 mcg Subcutaneous BID WC  . ezetimibe  10 mg Oral QPM  . insulin aspart  0-9 Units Subcutaneous TID WC  . insulin glargine  65 Units Subcutaneous QAC breakfast  . latanoprost  1 drop Left Eye QHS  . oxybutynin  10 mg Oral Daily  . pantoprazole  40 mg Oral Daily  . pravastatin  40 mg Oral  Daily   Infusions:    Labs:  Recent Labs  10/08/15 0916 10/10/15 0536  NA 140 138  K 4.0 3.8  CL 101 102  CO2 29 26  GLUCOSE 113* 129*  BUN 30* 23*  CREATININE 1.63* 1.37*  CALCIUM 9.0 8.9    Recent Labs  10/10/15 0536  AST 23  ALT 12*  ALKPHOS 70  BILITOT 0.3  PROT 7.8  ALBUMIN 3.7    Recent Labs  10/08/15 0916 10/10/15 0536  WBC 6.0 5.8  HGB 11.6* 11.7*  HCT 35.7* 35.2*  MCV 83.6 83.2  PLT 232 248    Recent Labs  10/08/15 1902 10/08/15 2203 10/09/15 0100  TROPONINI <0.03 <0.03 <0.03   Invalid input(s): POCBNP No results for input(s): HGBA1C in the last 72 hours.   Radiology/Studies:  Dg Chest 2 View  10/08/2015  CLINICAL DATA:  Upper chest pain for few days EXAM: CHEST  2 VIEW COMPARISON:  10/13/2011 FINDINGS: The heart size and mediastinal contours are within normal limits. Both lungs are clear. The visualized skeletal structures show degenerative changes of the acromioclavicular joints bilaterally. IMPRESSION: No active cardiopulmonary disease. Electronically Signed   By: Inez Catalina M.D.   On: 10/08/2015 08:55   Nm Myocar Multi W/spect W/wall Motion / Ef  10/10/2015  CLINICAL DATA:  80 year old with precordial chest pain. EXAM: MYOCARDIAL IMAGING WITH SPECT (REST AND PHARMACOLOGIC-STRESS - 2 DAY PROTOCOL) GATED LEFT VENTRICULAR WALL MOTION STUDY LEFT VENTRICULAR EJECTION FRACTION TECHNIQUE: Standard myocardial SPECT imaging  was performed after resting intravenous injection of 30 mCi Tc-48m sestamibi. Subsequently, on a second day, intravenous infusion of Lexiscan was performed under the supervision of the Cardiology staff. At peak effect of the drug, 30 mCi Tc-20m sestamibi was injected intravenously and standard myocardial SPECT imaging was performed. Quantitative gated imaging was also performed to evaluate left ventricular wall motion, and estimate left ventricular ejection fraction. COMPARISON:  Report from 07/03/2002 FINDINGS: Perfusion: There is a  large fixed defect involving the lateral wall compatible with an infarct. There is also decreased uptake in the inferior wall on both the rest and stress images. There is no evidence for reversibility or ischemia. Wall Motion: Very mild hypokinesia along the inferior wall, lateral wall and apex. Left Ventricular Ejection Fraction: 45 % End diastolic volume [id number] ml End systolic volume 60 ml IMPRESSION: 1. No evidence for reversibility or ischemia. Fixed defects involving the inferior and lateral walls are suggestive for areas of infarct. 2.  Mild hypokinesia. 3. Left ventricular ejection fraction is 45%. 4. Intermediate-risk stress test findings*. *2012 Appropriate Use Criteria for Coronary Revascularization Focused Update: J Am Coll Cardiol. [id number]. http://content.[email or web address].aspx?articleid=[email or web address] Electronically Signed   By: Markus Daft M.D.   On: 10/10/2015 14:09     Assessment and Plan  33F with HTN, DM, reported carotid artery disease (duplex 2014: mild plaque, no sig stenosis), OSA, morbid obesity, HLD, GERD presented to [facility] with mixed atypical/typical CP and renal insufficiency of unknown duration . She r/o for MI. 2D Echo 10/10/15: EF 60-65%, grade 1 DD, upper normal left atrial size, moderate posterior MAC with trivial MR, reduced RV contraction, trivial pericardial effusion. Nuc 2/4-10/10/15: no evidence for reversibility or ischemia, fixed defects involving the inferior and lateral walls are suggestive for areas of infarct, mild HK, EF 45%.  1. Chest pain - mixed atypical/typical features. Nuclear stress test results reviewed with Dr. Stanford Breed. Given multiple risk factors, would recommend cardiac cath for further evaluation. Risks and benefits of cardiac catheterization have been discussed with the patient.  These include bleeding, infection, kidney damage, stroke, heart attack, death. The patient understands these risks and is willing to proceed. There is no room on the cath board  for today thus will plan on proceeding tomorrow. (No LV gram given renal insufficiency). Will also trend renal function and gently hydrate starting this evening. Check lipid panel tomorrow and titrate statin if indicated.  2. Essential HTN - controlled for the time being but ultimately may need substitute for the HCTZ which has held due to #3.  3. Renal insufficiency of unknown chronicity, probably CKD stage III - patient reports her DM doctor has previously told her that her kidneys are functioning at "46%" (?GFR 46). Further per IM  (HCTZ on hold).  4. 4 beats NSVT - check Mg, K.  5. DM - pre-cath orders include order to give 1/2 dose basal insulin in AM tomorrow.   Signed, Melina Copa PA-C Pager: [phone]  As above, patient seen and examined.She denies chest pain or dyspnea. Results of nuclear study noted with fixed defects involving the inferior and lateral walls suggesting prior infarct. Given multiple risk factors and recurrent symptoms feel definitive evaluation is indicated.Plan cardiac catheterization tomorrow. The risks and benefits were discussed and the patient agrees to proceed. Kirk Ruths

## 2015-10-12 NOTE — Discharge Instructions (Signed)
Radial Site Care °Refer to this sheet in the next few weeks. These instructions provide you with information about caring for yourself after your procedure. Your health care provider may also give you more specific instructions. Your treatment has been planned according to current medical practices, but problems sometimes occur. Call your health care provider if you have any problems or questions after your procedure. °WHAT TO EXPECT AFTER THE PROCEDURE °After your procedure, it is typical to have the following: °· Bruising at the radial site that usually fades within 1-2 weeks. °· Blood collecting in the tissue (hematoma) that may be painful to the touch. It should usually decrease in size and tenderness within 1-2 weeks. °HOME CARE INSTRUCTIONS °· Take medicines only as directed by your health care provider. °· You may shower 24-48 hours after the procedure or as directed by your health care provider. Remove the bandage (dressing) and gently wash the site with plain soap and water. Pat the area dry with a clean towel. Do not rub the site, because this may cause bleeding. °· Do not take baths, swim, or use a hot tub until your health care provider approves. °· Check your insertion site every day for redness, swelling, or drainage. °· Do not apply powder or lotion to the site. °· Do not flex or bend the affected arm for 24 hours or as directed by your health care provider. °· Do not push or pull heavy objects with the affected arm for 24 hours or as directed by your health care provider. °· Do not lift over 10 lb (4.5 kg) for 5 days after your procedure or as directed by your health care provider. °· Ask your health care provider when it is okay to: °¨ Return to work or school. °¨ Resume usual physical activities or sports. °¨ Resume sexual activity. °· Do not drive home if you are discharged the same day as the procedure. Have someone else drive you. °· You may drive 24 hours after the procedure unless otherwise  instructed by your health care provider. °· Do not operate machinery or power tools for 24 hours after the procedure. °· If your procedure was done as an outpatient procedure, which means that you went home the same day as your procedure, a responsible adult should be with you for the first 24 hours after you arrive home. °· Keep all follow-up visits as directed by your health care provider. This is important. °SEEK MEDICAL CARE IF: °· You have a fever. °· You have chills. °· You have increased bleeding from the radial site. Hold pressure on the site. CALL 911 °SEEK IMMEDIATE MEDICAL CARE IF: °· You have unusual pain at the radial site. °· You have redness, warmth, or swelling at the radial site. °· You have drainage (other than a small amount of blood on the dressing) from the radial site. °· The radial site is bleeding, and the bleeding does not stop after 30 minutes of holding steady pressure on the site. °· Your arm or hand becomes pale, cool, tingly, or numb. °  °This information is not intended to replace advice given to you by your health care provider. Make sure you discuss any questions you have with your health care provider. °  °Document Released: 09/23/2010 Document Revised: 09/11/2014 Document Reviewed: 03/09/2014 °Elsevier Interactive Patient Education ©2016 Elsevier Inc. ° °

## 2015-10-12 NOTE — Discharge Summary (Signed)
Physician Discharge Summary  Jeanne [name] MRN: [id number] DOB/AGE: March 19, 1936 80 y.o.  PCP: Antony Blackbird, MD   Admit date: 10/08/2015 Discharge date: 10/12/2015  Discharge Diagnoses:   Principal Problem:   Chest pain Active Problems:   Obesity   Sleep apnea   Carotid arterial disease (HCC)   Diabetes mellitus type II, non insulin dependent (HCC)   Pain in the chest   NSVT (nonsustained ventricular tachycardia) (HCC)   Essential hypertension   CKD (chronic kidney disease), stage III   Unstable angina (HCC)    Follow-up recommendations Follow-up with PCP in 3-5 days , including all  additional recommended appointments as below Follow-up CBC, CMP in 3-5 days Patient advised to hold her ACE inhibitor until seen by her primary care provider, this may be may be resumed if her renal function is back to baseline     Medication List    TAKE these medications        aspirin EC 81 MG tablet  Take 81 mg by mouth daily.     diltiazem 120 MG 24 hr capsule  Commonly known as:  TIAZAC  Take 120 mg by mouth daily.     enalapril 10 MG tablet  Commonly known as:  VASOTEC  Take 1 tablet (10 mg total) by mouth daily.  Start taking on:  10/18/2015     exenatide 10 MCG/0.04ML Sopn injection  Commonly known as:  BYETTA  Inject 10 mcg into the skin 2 (two) times daily with a meal.     ezetimibe 10 MG tablet  Commonly known as:  ZETIA  Take 10 mg by mouth every evening.     hydrochlorothiazide 50 MG tablet  Commonly known as:  HYDRODIURIL  Take 50 mg by mouth daily.     insulin glargine 100 UNIT/ML injection  Commonly known as:  LANTUS  Inject 65 Units into the skin every morning.     latanoprost 0.005 % ophthalmic solution  Commonly known as:  XALATAN  Place 1 drop into the left eye at bedtime.     oxybutynin 10 MG 24 hr tablet  Commonly known as:  DITROPAN-XL  Take 10 mg by mouth daily.     pantoprazole 40 MG tablet  Commonly known as:  PROTONIX  Take 40 mg by mouth  daily.     pravastatin 40 MG tablet  Commonly known as:  PRAVACHOL  Take 40 mg by mouth daily.         Discharge Condition: stable  Discharge Instructions   Discharge Instructions    Diet - low sodium heart healthy    Complete by:  As directed      Increase activity slowly    Complete by:  As directed                Disposition: 01-Home or Self Care   Consults:  Cardiology     Significant Diagnostic Studies:  Dg Chest 2 View  10/08/2015  CLINICAL DATA:  Upper chest pain for few days EXAM: CHEST  2 VIEW COMPARISON:  10/13/2011 FINDINGS: The heart size and mediastinal contours are within normal limits. Both lungs are clear. The visualized skeletal structures show degenerative changes of the acromioclavicular joints bilaterally. IMPRESSION: No active cardiopulmonary disease. Electronically Signed   By: Inez Catalina M.D.   On: 10/08/2015 08:55   Nm Myocar Multi W/spect W/wall Motion / Ef  10/10/2015  CLINICAL DATA:  80 year old with precordial chest pain. EXAM: MYOCARDIAL IMAGING WITH SPECT (REST AND PHARMACOLOGIC-STRESS -  2 DAY PROTOCOL) GATED LEFT VENTRICULAR WALL MOTION STUDY LEFT VENTRICULAR EJECTION FRACTION TECHNIQUE: Standard myocardial SPECT imaging was performed after resting intravenous injection of 30 mCi Tc-46msestamibi. Subsequently, on a second day, intravenous infusion of Lexiscan was performed under the supervision of the Cardiology staff. At peak effect of the drug, 30 mCi Tc-930mestamibi was injected intravenously and standard myocardial SPECT imaging was performed. Quantitative gated imaging was also performed to evaluate left ventricular wall motion, and estimate left ventricular ejection fraction. COMPARISON:  Report from 07/03/2002 FINDINGS: Perfusion: There is a large fixed defect involving the lateral wall compatible with an infarct. There is also decreased uptake in the inferior wall on both the rest and stress images. There is no evidence for  reversibility or ischemia. Wall Motion: Very mild hypokinesia along the inferior wall, lateral wall and apex. Left Ventricular Ejection Fraction: 45 % End diastolic volume 11518l End systolic volume 60 ml IMPRESSION: 1. No evidence for reversibility or ischemia. Fixed defects involving the inferior and lateral walls are suggestive for areas of infarct. 2.  Mild hypokinesia. 3. Left ventricular ejection fraction is 45%. 4. Intermediate-risk stress test findings*. *2012 Appropriate Use Criteria for Coronary Revascularization Focused Update: J Am Coll Cardiol. 208416;60(6):301-601http://content.[email or web address]?[email or web address] Electronically Signed   By: AdMarkus Daft.D.   On: 10/10/2015 14:09        Filed Weights   10/08/15 1304 10/11/15 1426  Weight: 147.6 kg (325 lb 6.4 oz) 148.2 kg (326 lb 11.6 oz)     Microbiology: No results found for this or any previous visit (from the past 240 hour(s)).     Blood Culture    Component Value Date/Time   SDES URINE, CATHETERIZED 09/11/2011 1525   SPECREQUEST ADDED 09/11/11 1654 09/11/2011 1525   CULT ESCHERICHIA COLI 09/11/2011 1525   REPTSTATUS 09/14/2011 FINAL 09/11/2011 1525      Labs: Results for orders placed or performed during the hospital encounter of 10/08/15 (from the past 48 hour(s))  Glucose, capillary     Status: Abnormal   Collection Time: 10/10/15  4:45 PM  Result Value Ref Range   Glucose-Capillary 162 (H) 65 - 99 mg/dL  Glucose, capillary     Status: Abnormal   Collection Time: 10/10/15 10:49 PM  Result Value Ref Range   Glucose-Capillary 112 (H) 65 - 99 mg/dL   Comment 1 Notify RN    Comment 2 Document in Chart   Glucose, capillary     Status: Abnormal   Collection Time: 10/11/15  6:37 AM  Result Value Ref Range   Glucose-Capillary 129 (H) 65 - 99 mg/dL   Comment 1 Notify RN    Comment 2 Document in Chart   Glucose, capillary     Status: Abnormal   Collection Time: 10/11/15  7:42 AM  Result Value Ref  Range   Glucose-Capillary 113 (H) 65 - 99 mg/dL  Basic metabolic panel     Status: Abnormal   Collection Time: 10/11/15 10:06 AM  Result Value Ref Range   Sodium 143 135 - 145 mmol/L   Potassium 4.1 3.5 - 5.1 mmol/L   Chloride 104 101 - 111 mmol/L   CO2 25 22 - 32 mmol/L   Glucose, Bld 101 (H) 65 - 99 mg/dL   BUN 21 (H) 6 - 20 mg/dL   Creatinine, Ser 1.29 (H) 0.44 - 1.00 mg/dL   Calcium 9.6 8.9 - 10.3 mg/dL   GFR calc non Af Amer 39 (L) >60 mL/min   GFR calc Af  Amer 45 (L) >60 mL/min    Comment: (NOTE) The eGFR has been calculated using the CKD EPI equation. This calculation has not been validated in all clinical situations. eGFR's persistently <60 mL/min signify possible Chronic Kidney Disease.    Anion gap 14 5 - 15  Magnesium     Status: None   Collection Time: 10/11/15 10:06 AM  Result Value Ref Range   Magnesium 1.9 1.7 - 2.4 mg/dL  Glucose, capillary     Status: Abnormal   Collection Time: 10/11/15 12:11 PM  Result Value Ref Range   Glucose-Capillary 142 (H) 65 - 99 mg/dL  Glucose, capillary     Status: Abnormal   Collection Time: 10/11/15  4:23 PM  Result Value Ref Range   Glucose-Capillary 54 (L) 65 - 99 mg/dL  Glucose, capillary     Status: None   Collection Time: 10/11/15  4:53 PM  Result Value Ref Range   Glucose-Capillary 72 65 - 99 mg/dL  Glucose, capillary     Status: Abnormal   Collection Time: 10/11/15  5:30 PM  Result Value Ref Range   Glucose-Capillary 122 (H) 65 - 99 mg/dL  Glucose, capillary     Status: Abnormal   Collection Time: 10/11/15  9:41 PM  Result Value Ref Range   Glucose-Capillary 114 (H) 65 - 99 mg/dL  Basic metabolic panel     Status: Abnormal   Collection Time: 10/12/15  5:39 AM  Result Value Ref Range   Sodium 142 135 - 145 mmol/L   Potassium 3.9 3.5 - 5.1 mmol/L   Chloride 104 101 - 111 mmol/L   CO2 27 22 - 32 mmol/L   Glucose, Bld 169 (H) 65 - 99 mg/dL   BUN 22 (H) 6 - 20 mg/dL   Creatinine, Ser 1.23 (H) 0.44 - 1.00 mg/dL    Calcium 9.2 8.9 - 10.3 mg/dL   GFR calc non Af Amer 41 (L) >60 mL/min   GFR calc Af Amer 48 (L) >60 mL/min    Comment: (NOTE) The eGFR has been calculated using the CKD EPI equation. This calculation has not been validated in all clinical situations. eGFR's persistently <60 mL/min signify possible Chronic Kidney Disease.    Anion gap 11 5 - 15  CBC     Status: Abnormal   Collection Time: 10/12/15  5:39 AM  Result Value Ref Range   WBC 5.6 4.0 - 10.5 K/uL   RBC 4.05 3.87 - 5.11 MIL/uL   Hemoglobin 10.8 (L) 12.0 - 15.0 g/dL   HCT 34.9 (L) 36.0 - 46.0 %   MCV 86.2 78.0 - 100.0 fL   MCH 26.7 26.0 - 34.0 pg   MCHC 30.9 30.0 - 36.0 g/dL   RDW 14.3 11.5 - 15.5 %   Platelets 219 150 - 400 K/uL  Protime-INR     Status: None   Collection Time: 10/12/15  5:39 AM  Result Value Ref Range   Prothrombin Time 13.5 11.6 - 15.2 seconds   INR 1.01 0.00 - 1.49  Lipid panel     Status: Abnormal   Collection Time: 10/12/15  5:39 AM  Result Value Ref Range   Cholesterol 90 0 - 200 mg/dL   Triglycerides 134 <150 mg/dL   HDL 28 (L) >40 mg/dL   Total CHOL/HDL Ratio 3.2 RATIO   VLDL 27 0 - 40 mg/dL   LDL Cholesterol 35 0 - 99 mg/dL    Comment:        Total Cholesterol/HDL:CHD Risk Coronary  Heart Disease Risk Table                     Men   Women  1/2 Average Risk   3.4   3.3  Average Risk       5.0   4.4  2 X Average Risk   9.6   7.1  3 X Average Risk  23.4   11.0        Use the calculated Patient Ratio above and the CHD Risk Table to determine the patient's CHD Risk.        ATP III CLASSIFICATION (LDL):  <100     mg/dL   Optimal  692-294  mg/dL   Near or Above                    Optimal  130-159  mg/dL   Borderline  082-366  mg/dL   High  >980     mg/dL   Very High Performed at [facility]   Glucose, capillary     Status: Abnormal   Collection Time: 10/12/15  7:57 AM  Result Value Ref Range   Glucose-Capillary 112 (H) 65 - 99 mg/dL  Glucose, capillary     Status: Abnormal    Collection Time: 10/12/15 12:48 PM  Result Value Ref Range   Glucose-Capillary 58 (L) 65 - 99 mg/dL     Lipid Panel     Component Value Date/Time   CHOL 90 10/12/2015 0539   TRIG 134 10/12/2015 0539   HDL 28* 10/12/2015 0539   CHOLHDL 3.2 10/12/2015 0539   VLDL 27 10/12/2015 0539   LDLCALC 35 10/12/2015 0539     Lab Results  Component Value Date   HGBA1C 6.6* 10/09/2015     Lab Results  Component Value Date   LDLCALC 35 10/12/2015   CREATININE 1.23* 10/12/2015     HPI :*79yo F w/ PMH including HTN, HLD, T2DM, OSA, carotid artery disease who presents with chest pain. The patient reports that for the past several weeks she has had intermittent episodes of central chest pressure that occurs at rest or with exertion most commonly precipitated by anxiety and stress. Symptoms last only 5 minutes, only associated with headache, not associated with syncope near syncope palpitations or lightheadedness. She occasionally has pain down her bilateral arms but she is unsure whether it is related to her water aerobics activities. No recent fever, nausea, vomiting , diarrhea. Family history positive for both parents and 2 brothers who died of a heart attack. Initial troponin, EKG, chest x-ray negative , Creatinine 1.6 , baseline unknown , currently chest pain-free   HOSPITAL COURSE:    Chest pain -fairly atypical most likely related to anxiety, However patient's heart score is 6 with  risk factors for CAD including age, DM, HTN, HLD, known PAD Cardiology was consulted and they recommended 2 day  stress test  currently on aspirin, diltiazem CD, Zetia, HCTZ, insulin and Pravachol, negative cardiac enzymes, telemetry uneventful ECG with low voltage and left anterior fascicular block Nuc 2/4-10/10/15: no evidence for reversibility or ischemia, fixed defects involving the inferior and lateral walls are suggestive for areas of infarct, mild HK, EF 45%.  LHC on 2/7  Mild to moderate  obstructive coronary disease involving the mid LAD, the first and second diagonal branches, the distal circumflex, and diffusely throughout the mid RCA.  No clinically significant obstruction is identified especially with reference to the perfusion abnormalities noted on radionuclide scintigraphy.  Previously documented normal left ventricular function. Normal left ventricular hemodynamics were identified with an end-diastolic pressure of 13 mmHg.   Recommendations:Consider alternative explanations for the patient's chest discomfort     Obesity Body mass index is 57.89 kg/(m^2). Weight loss counselling   Sleep apnea- continue cpap   Carotid arterial disease (HCC) -Continue aspirin , statin   Diabetes mellitus type II, non insulin dependent (HCC) -  hemoglobin A1c 6.6, continue [place], Lantus,   Hypertension continue diltiazem , HCTZ and hold lisinopril , this may be restarted by PCP if the renal function is back down to baseline  chronic kidney disease-stage 2-3-baseline creatinine unknown, Hold ACE inhibitor for now  Discharge Exam:  Blood pressure 126/47, pulse 81, temperature 98 F (36.7 C), temperature source Oral, resp. rate 17, height _0  (1.6 m), weight 148.2 kg (326 lb 11.6 oz), SpO2 96 %.  General: Well developed, well nourished, NAD  HEENT: OP clear, mucus membranes moist  SKIN: warm, dry. No rashes.  Neuro: No focal deficits  Musculoskeletal: Muscle strength 5/5 all ext  Psychiatric: Mood and affect normal  Neck: No JVD, no carotid bruits, no thyromegaly, no lymphadenopathy.  Lungs:Clear bilaterally, no wheezes, rhonci, crackles  Cardiovascular: Regular rate and rhythm. No murmurs, gallops or rubs    Follow-up Information    Follow up with FULP, CAMMIE, MD. Schedule an appointment as soon as possible for a visit in 3 days.   Specialty:  Family Medicine   Contact information:   [address] [address] [phone]        Signed: Reyne Dumas 10/12/2015, 1:13 PM        Time spent >45 mins

## 2015-10-12 NOTE — Progress Notes (Signed)
Pt transferred from [facility] for cath lab procedure.  Pt alert and oriented X4, and denies any discomfort at this time.  IVF infusing and pt on bedside monitor.

## 2015-10-12 NOTE — Interval H&P Note (Signed)
Cath Lab Visit (complete for each Cath Lab visit)  Clinical Evaluation Leading to the Procedure:   ACS: Yes.    Non-ACS:    Anginal Classification: CCS III  Anti-ischemic medical therapy: Maximal Therapy (2 or more classes of medications)  Non-Invasive Test Results: Intermediate-risk stress test findings: cardiac mortality 1-3%/year  Prior CABG: No previous CABG      History and Physical Interval Note:  10/12/2015 11:43 AM  Jeanne [name]  has presented today for surgery, with the diagnosis of cp  The various methods of treatment have been discussed with the patient and family. After consideration of risks, benefits and other options for treatment, the patient has consented to  Procedure(s): Left Heart Cath and Coronary Angiography (N/A) as a surgical intervention .  The patient's history has been reviewed, patient examined, no change in status, stable for surgery.  I have reviewed the patient's chart and labs.  Questions were answered to the patient's satisfaction.    The patient's history and all clinical data were reviewed in the cath lab. She has CCS class III symptoms, an intermediate risk myocardial perfusion study, and significant risk factors including diabetes mellitus. She is on a moderate medical regimen.  Sinclair Grooms

## 2015-10-12 NOTE — Progress Notes (Signed)
Patient: Jeanne [name] / Admit Date: 10/08/2015 / Date of Encounter: 10/12/2015, 7:42 AM   Subjective: Eager to get cath over with. She found the pre-cath video helpful. No CP or SOB reported today.   Objective: Telemetry: NSR, nocturnal SB upper 40s Physical Exam: Blood pressure 125/51, pulse 80, temperature 98 F (36.7 C), temperature source Oral, resp. rate 18, height 5\' 3"  (1.6 m), weight 326 lb 11.6 oz (148.2 kg), SpO2 98 %. General: Well developed obese AAF in no acute distress. Head: Normocephalic, atraumatic, sclera non-icteric, no xanthomas, nares are without discharge. Neck: Negative for carotid bruits. JVP not elevated. Lungs: Clear bilaterally to auscultation without wheezes, rales, or rhonchi. Breathing is unlabored. Heart: RRR S1 S2 without murmurs, rubs, or gallops.  Abdomen: Soft, non-tender, non-distended with normoactive bowel sounds. No rebound/guarding. Extremities: No clubbing or cyanosis. No edema. Distal pedal pulses are 2+ and equal bilaterally. Neuro: Alert and oriented X 3. Moves all extremities spontaneously. Psych: Responds to questions appropriately with a normal affect.  Intake/Output Summary (Last 24 hours) at 10/12/15 0742 Last data filed at 10/12/15 0700  Gross per 24 hour  Intake 1323.33 ml  Output      2 ml  Net 1321.33 ml    Inpatient Medications:  . aspirin EC  81 mg Oral Daily  . diltiazem  120 mg Oral Daily  . enoxaparin (LOVENOX) injection  40 mg Subcutaneous Q24H  . exenatide  10 mcg Subcutaneous BID WC  . ezetimibe  10 mg Oral QPM  . insulin aspart  0-9 Units Subcutaneous TID WC  . insulin glargine  30 Units Subcutaneous Daily  . latanoprost  1 drop Left Eye QHS  . oxybutynin  10 mg Oral Daily  . pantoprazole  40 mg Oral Daily  . pravastatin  40 mg Oral Daily   Infusions:  . sodium chloride 100 mL/hr at 10/12/15 0658    Labs:  Recent Labs  10/11/15 1006 10/12/15 0539  NA 143 142  K 4.1 3.9  CL 104 104  CO2 25 27    GLUCOSE 101* 169*  BUN 21* 22*  CREATININE 1.29* 1.23*  CALCIUM 9.6 9.2  MG 1.9  --     Recent Labs  10/10/15 0536  AST 23  ALT 12*  ALKPHOS 70  BILITOT 0.3  PROT 7.8  ALBUMIN 3.7    Recent Labs  10/10/15 0536 10/12/15 0539  WBC 5.8 5.6  HGB 11.7* 10.8*  HCT 35.2* 34.9*  MCV 83.2 86.2  PLT 248 219   No results for input(s): CKTOTAL, CKMB, TROPONINI in the last 72 hours. Invalid input(s): POCBNP No results for input(s): HGBA1C in the last 72 hours.   Radiology/Studies:  Dg Chest 2 View  10/08/2015  CLINICAL DATA:  Upper chest pain for few days EXAM: CHEST  2 VIEW COMPARISON:  10/13/2011 FINDINGS: The heart size and mediastinal contours are within normal limits. Both lungs are clear. The visualized skeletal structures show degenerative changes of the acromioclavicular joints bilaterally. IMPRESSION: No active cardiopulmonary disease. Electronically Signed   By: Inez Catalina M.D.   On: 10/08/2015 08:55   Nm Myocar Multi W/spect W/wall Motion / Ef  10/10/2015  CLINICAL DATA:  80 year old with precordial chest pain. EXAM: MYOCARDIAL IMAGING WITH SPECT (REST AND PHARMACOLOGIC-STRESS - 2 DAY PROTOCOL) GATED LEFT VENTRICULAR WALL MOTION STUDY LEFT VENTRICULAR EJECTION FRACTION TECHNIQUE: Standard myocardial SPECT imaging was performed after resting intravenous injection of 30 mCi Tc-56m sestamibi. Subsequently, on a second day, intravenous infusion of Lexiscan  was performed under the supervision of the Cardiology staff. At peak effect of the drug, 30 mCi Tc-68m sestamibi was injected intravenously and standard myocardial SPECT imaging was performed. Quantitative gated imaging was also performed to evaluate left ventricular wall motion, and estimate left ventricular ejection fraction. COMPARISON:  Report from 07/03/2002 FINDINGS: Perfusion: There is a large fixed defect involving the lateral wall compatible with an infarct. There is also decreased uptake in the inferior wall on both the  rest and stress images. There is no evidence for reversibility or ischemia. Wall Motion: Very mild hypokinesia along the inferior wall, lateral wall and apex. Left Ventricular Ejection Fraction: 45 % End diastolic volume [id number] ml End systolic volume 60 ml IMPRESSION: 1. No evidence for reversibility or ischemia. Fixed defects involving the inferior and lateral walls are suggestive for areas of infarct. 2.  Mild hypokinesia. 3. Left ventricular ejection fraction is 45%. 4. Intermediate-risk stress test findings*. *2012 Appropriate Use Criteria for Coronary Revascularization Focused Update: J Am Coll Cardiol. [id number]. http://content.airportbarriers.com.aspx?articleid=1201161 Electronically Signed   By: Markus Daft M.D.   On: 10/10/2015 14:09     Assessment and Plan  80F with HTN, DM, reported carotid artery disease (duplex 2014: mild plaque, no sig stenosis), OSA, morbid obesity, HLD, GERD presented to [facility] with mixed atypical/typical CP and renal insufficiency of unknown duration . She r/o for MI. 2D Echo 10/10/15: EF 60-65%, grade 1 DD, upper normal left atrial size, moderate posterior MAC with trivial MR, reduced RV contraction, trivial pericardial effusion. Nuc 2/4-10/10/15: no evidence for reversibility or ischemia, fixed defects involving the inferior and lateral walls are suggestive for areas of infarct, mild HK, EF 45%.  1. Chest pain - mixed atypical/typical features. Nuc as above. Plan cath today for definitive evaluation (risks/benefits/alternatives already discussed). Avoid LV gram given CKD and pre-assessed LV function by above studies. If she receives a stent at [facility] I told IM it would be reasonable for [place] to assume on our service and discharge tomorrow. Otherwise, IM will continue to manage.  2. Essential HTN - controlled for the time being but ultimately may need substitute for the HCTZ which has held due to #3.  3. Renal insufficiency of unknown chronicity, probably CKD stage III -  patient reports her DM doctor has previously told her that her kidneys are functioning at "46%" (?GFR 46). Further per IM (HCTZ on hold).  4. 4 beats NSVT - lytes OK. No further ectopy on telemetry.  5. DM - IM has adjusted Lantus given NPO status.  SignedMelina Copa PA-C Pager: [phone]   See cath results; plan medical therapy; FU Dr Angelena Form. Kirk Ruths

## 2015-10-12 NOTE — Care Management Obs Status (Signed)
MEDICARE OBSERVATION STATUS NOTIFICATION   Patient Details  Name: Jeanne [name] MRN: [id number] Date of Birth: 1936/06/26   Medicare Observation Status Notification Given:  Yes    Vergie Living, RN 10/12/2015, 3:28 PM

## 2015-10-12 NOTE — Progress Notes (Signed)
Pt declines bp check further, states it hurts so bad its worse than the heart cath, I have tried two diff cuffs both large and two different sites, arm and ankle. Pt refuses bp check. Will continue to monitor.

## 2015-10-12 NOTE — Progress Notes (Signed)
Pt to cath lab for procedure. 

## 2015-10-13 ENCOUNTER — Encounter (HOSPITAL_COMMUNITY): Payer: Self-pay | Admitting: Interventional Cardiology

## 2015-12-15 ENCOUNTER — Ambulatory Visit: Payer: Medicare Other | Admitting: Podiatry

## 2016-06-07 ENCOUNTER — Emergency Department (HOSPITAL_COMMUNITY): Payer: Medicare Other

## 2016-06-07 ENCOUNTER — Encounter (HOSPITAL_COMMUNITY): Payer: Self-pay

## 2016-06-07 ENCOUNTER — Emergency Department (HOSPITAL_COMMUNITY)
Admission: EM | Admit: 2016-06-07 | Discharge: 2016-06-07 | Disposition: A | Discharge status: Home / Self Care | Payer: Medicare Other | Attending: Emergency Medicine | Admitting: Emergency Medicine

## 2016-06-07 DIAGNOSIS — M545 Low back pain, unspecified: Secondary | ICD-10-CM

## 2016-06-07 DIAGNOSIS — N183 Chronic kidney disease, stage 3 (moderate): Secondary | ICD-10-CM | POA: Insufficient documentation

## 2016-06-07 DIAGNOSIS — Z96651 Presence of right artificial knee joint: Secondary | ICD-10-CM | POA: Diagnosis not present

## 2016-06-07 DIAGNOSIS — Z794 Long term (current) use of insulin: Secondary | ICD-10-CM | POA: Insufficient documentation

## 2016-06-07 DIAGNOSIS — Z7982 Long term (current) use of aspirin: Secondary | ICD-10-CM | POA: Insufficient documentation

## 2016-06-07 DIAGNOSIS — I129 Hypertensive chronic kidney disease with stage 1 through stage 4 chronic kidney disease, or unspecified chronic kidney disease: Secondary | ICD-10-CM | POA: Diagnosis not present

## 2016-06-07 DIAGNOSIS — E119 Type 2 diabetes mellitus without complications: Secondary | ICD-10-CM | POA: Diagnosis not present

## 2016-06-07 DIAGNOSIS — Z79899 Other long term (current) drug therapy: Secondary | ICD-10-CM | POA: Diagnosis not present

## 2016-06-07 MED ORDER — NAPROXEN 500 MG PO TABS: 500.0000 mg | ORAL_TABLET | Freq: Two times a day (BID) | ORAL | 0 refills | Status: DC

## 2016-06-07 MED ORDER — DIAZEPAM 5 MG PO TABS: 2.5000 mg | ORAL_TABLET | Freq: Two times a day (BID) | ORAL | 0 refills | Status: DC

## 2016-06-07 NOTE — ED Provider Notes (Signed)
[place] DEPT Provider Note   CSN: [id number] Arrival date & time: 06/07/16  1018  By signing my name below, I, Rayna Sexton, attest that this documentation has been prepared under the direction and in the presence of Nona Dell, [place]. Electronically Signed: Rayna Sexton, ED Scribe. 06/07/16. 1:32 PM.   History   Chief Complaint Chief Complaint  Patient presents with  . Back Pain    HPI HPI Comments: Jeanne [name] is a 80 y.o. female with a h/o obesity who presents to the Emergency Department complaining of constant, moderate, left lower back pain x 4 days. Her pain began when getting out of bed and reaching behind her to grab something. She states her pain worsens when twisting, sitting and with palpation and describes it as a soreness. Her pain mildly alleviates when lying down. She denies her pain radiates down her LLE. Pt has taken Aleve which provides mild short term relief. She reports chronic left knee pain but states this has been ongoing issue and denies acutely worsened since the onset of her back pain. She began going to the [facility] six weeks ago and doing water aerobics. Pt reports a h/o of similar symptoms which typically alleviate on their own. She ambulates with a cane at baseline and has been using a cane to ambulate since the onset of her back pain. Pt denies a h/o CA or IVDA and does not regularly use narcotic medications. Pt is not on anticoagulants. She denies fevers, chills, body aches, CP, SOB, abd pain, n/v/d/c, bowel or bladder incontinence, urinary sxs, numbness, weakness and saddle anesthesia.   The history is provided by the patient and a relative. No language interpreter was used.   Past Medical History:  Diagnosis Date  . Anemia   . Arthritis   . Carotid arterial disease ([place])   . Diabetes mellitus   . Hyperlipidemia   . Hypertension   . Migraine   . Obesity   . Reflux   . Sleep apnea     Patient Active Problem List   Diagnosis  Date Noted  . Unstable angina ([place]) 10/12/2015  . Abnormal myocardial perfusion study 10/12/2015  . NSVT (nonsustained ventricular tachycardia) ([place]) 10/11/2015  . Essential hypertension 10/11/2015  . CKD (chronic kidney disease), stage III 10/11/2015  . Pain in the chest   . Chest pain 10/08/2015  . Diabetes mellitus type II, non insulin dependent ([place]) 10/08/2015  . AKI (acute kidney injury) ([place])   . Abscess of left arm 01/01/2013  . Migraine   . Obesity   . Sleep apnea   . Carotid arterial disease ([place])   . Reflux   . Anemia     Past Surgical History:  Procedure Laterality Date  . CARDIAC CATHETERIZATION N/A 10/12/2015   Procedure: Left Heart Cath and Coronary Angiography;  Surgeon: Belva Crome, MD;  Location: [facility];  Service: Cardiovascular;  Laterality: N/A;  . CHOLECYSTECTOMY  50 yrs ago  . JOINT REPLACEMENT Right 2005   knee  . WISDOM TOOTH EXTRACTION      OB History    No data available       Home Medications    Prior to Admission medications   Medication Sig Start Date End Date Taking? Authorizing Provider  aspirin EC 81 MG tablet Take 81 mg by mouth daily.      Historical Provider, MD  diltiazem (TIAZAC) 120 MG 24 hr capsule Take 120 mg by mouth daily.  Historical Provider, MD  enalapril (VASOTEC) 10 MG tablet Take 1 tablet (10 mg total) by mouth daily. 10/18/15   Reyne Dumas, MD  exenatide (BYETTA) 10 MCG/0.04ML SOPN injection Inject 10 mcg into the skin 2 (two) times daily with a meal.    Historical Provider, MD  ezetimibe (ZETIA) 10 MG tablet Take 10 mg by mouth every evening.      Historical Provider, MD  hydrochlorothiazide (HYDRODIURIL) 50 MG tablet Take 50 mg by mouth daily.      Historical Provider, MD  insulin glargine (LANTUS) 100 UNIT/ML injection Inject 65 Units into the skin every morning.     Historical Provider, MD  latanoprost (XALATAN) 0.005 % ophthalmic solution Place 1 drop into the left eye at bedtime. 09/26/15   Historical  Provider, MD  oxybutynin (DITROPAN-XL) 10 MG 24 hr tablet Take 10 mg by mouth daily.      Historical Provider, MD  pantoprazole (PROTONIX) 40 MG tablet Take 40 mg by mouth daily. 08/25/15   Historical Provider, MD  pravastatin (PRAVACHOL) 40 MG tablet Take 40 mg by mouth daily.     Historical Provider, MD    Family History Family History  Problem Relation Age of Onset  . Heart attack Father     Social History Social History  Substance Use Topics  . Smoking status: Never Smoker  . Smokeless tobacco: Never Used  . Alcohol use No     Allergies   Amoxicillin and Invokana [canagliflozin]   Review of Systems Review of Systems  Constitutional: Negative for chills and fever.  Respiratory: Negative for shortness of breath.   Cardiovascular: Negative for chest pain.  Gastrointestinal: Negative for abdominal pain, constipation, diarrhea, nausea and vomiting.  Musculoskeletal: Positive for arthralgias and back pain.  Neurological: Negative for weakness and numbness.     Physical Exam Updated Vital Signs BP 135/89 (BP Location: Right Arm)   Pulse 68   Temp 97.4 F (36.3 C) (Oral)   Resp 18   SpO2 100%   Physical Exam  Constitutional: She is oriented to person, place, and time. She appears well-developed and well-nourished. No distress.  HENT:  Head: Normocephalic and atraumatic.  Mouth/Throat: Oropharynx is clear and moist. No oropharyngeal exudate.  Eyes: Conjunctivae and EOM are normal. Right eye exhibits no discharge. Left eye exhibits no discharge. No scleral icterus.  Neck: Normal range of motion. Neck supple.  Cardiovascular: Normal rate, regular rhythm, normal heart sounds and intact distal pulses.   Pulmonary/Chest: Effort normal and breath sounds normal. No respiratory distress. She has no wheezes. She has no rales. She exhibits no tenderness.  Abdominal: Soft. Bowel sounds are normal. She exhibits no distension and no mass. There is no tenderness. There is no  rebound and no guarding. No hernia.  Musculoskeletal: Normal range of motion. She exhibits tenderness. She exhibits no edema or deformity.  No midline C or T tenderness. Midline lumbar tenderness and mild tenderness over left lumbar paraspinal muscles and left lateral hip. Full range of motion of neck and back. Full range of motion of bilateral upper and lower extremities, with 5/5 strength. FROM of bilateral hips. Sensation intact. 2+ radial and PT pulses. Cap refill <2 seconds. Patient able to stand and ambulate with cane which she uses at baseline.   Neurological: She is alert and oriented to person, place, and time. She has normal strength and normal reflexes. No sensory deficit.  Skin: Skin is warm and dry. She is not diaphoretic.  Nursing note and vitals reviewed.  ED Treatments / Results  Labs (all labs ordered are listed, but only abnormal results are displayed) Labs Reviewed - No data to display  EKG  EKG Interpretation None       Radiology No results found.  Procedures Procedures  DIAGNOSTIC STUDIES: Oxygen Saturation is 100% on RA, normal by my interpretation.    COORDINATION OF CARE: 12:20 PM Discussed next steps with pt. Pt verbalized understanding and is agreeable with the plan.    Medications Ordered in ED Medications - No data to display   Initial Impression / Assessment and Plan / ED Course  I have reviewed the triage vital signs and the nursing notes.  Pertinent labs & imaging results that were available during my care of the patient were reviewed by me and considered in my medical decision making (see chart for details).  Clinical Course    Patient with back pain.  No neurological deficits and normal neuro exam.  Patient can walk but states is painful.  No loss of bowel or bladder control.  No concern for cauda equina.  No fever, night sweats, weight loss, h/o cancer, IVDU.  Xrays of lumbar spine and left hip showed arthritis and multilevel spondylosis.  RICE protocol and pain medicine indicated and discussed with patient. Advised patient to follow up with PCP. Discussed return precautions.  I personally performed the services described in this documentation, which was scribed in my presence. The recorded information has been reviewed and is accurate.  Final Clinical Impressions(s) / ED Diagnoses   Final diagnoses:  None    New Prescriptions New Prescriptions   No medications on file     Nona Dell, PA-C 06/07/16 [place] Yao, MD 06/07/16 1742

## 2016-06-07 NOTE — Discharge Instructions (Signed)
Take your medications as prescribed as hand for pain relief. I also recommend applying ice and/or heat to affected area for 15 minutes 3-4 times daily as needed for pain relief. I recommend resting for the next few days and refraining from doing any heavy lifting or strenuous activity until your back pain has improved. With your family doctor in the next week for follow-up if your back pain continues. Please return to the Emergency Department if symptoms worsen or new onset of numbness, tingling, groin anesthesia, loss of bowel or bladder, weakness, urinary symptoms.

## 2016-06-07 NOTE — ED Notes (Signed)
PT DISCHARGED. INSTRUCTIONS AND PRESCRIPTIONS GIVEN. AAOX4. PT IN NO APPARENT DISTRESS. THE OPPORTUNITY TO ASK QUESTIONS WAS PROVIDED. 

## 2016-06-07 NOTE — ED Triage Notes (Signed)
Pt twisted back x 3 days ago.  Hx of same.  No urinary symptoms.

## 2016-07-07 ENCOUNTER — Other Ambulatory Visit: Payer: Self-pay | Admitting: Family

## 2016-07-07 DIAGNOSIS — H532 Diplopia: Secondary | ICD-10-CM

## 2016-07-07 DIAGNOSIS — R519 Headache, unspecified: Secondary | ICD-10-CM

## 2016-07-07 DIAGNOSIS — R51 Headache: Secondary | ICD-10-CM

## 2016-07-07 DIAGNOSIS — R42 Dizziness and giddiness: Secondary | ICD-10-CM

## 2016-07-20 ENCOUNTER — Ambulatory Visit
Admission: RE | Admit: 2016-07-20 | Discharge: 2016-07-20 | Disposition: A | Discharge status: Home / Self Care | Payer: Medicare Other | Source: Ambulatory Visit | Attending: Family | Admitting: Family

## 2016-07-20 DIAGNOSIS — R42 Dizziness and giddiness: Secondary | ICD-10-CM

## 2016-07-20 DIAGNOSIS — H532 Diplopia: Secondary | ICD-10-CM

## 2016-07-20 DIAGNOSIS — R519 Headache, unspecified: Secondary | ICD-10-CM

## 2016-07-20 DIAGNOSIS — R51 Headache: Secondary | ICD-10-CM

## 2016-09-11 ENCOUNTER — Encounter (HOSPITAL_COMMUNITY): Payer: Self-pay | Admitting: Emergency Medicine

## 2016-09-11 ENCOUNTER — Ambulatory Visit (HOSPITAL_COMMUNITY)
Admission: EM | Admit: 2016-09-11 | Discharge: 2016-09-11 | Disposition: A | Discharge status: Home / Self Care | Payer: Medicare Other | Attending: Emergency Medicine | Admitting: Emergency Medicine

## 2016-09-11 DIAGNOSIS — J40 Bronchitis, not specified as acute or chronic: Secondary | ICD-10-CM | POA: Diagnosis not present

## 2016-09-11 MED ORDER — GUAIFENESIN ER 600 MG PO TB12: 1200.0000 mg | ORAL_TABLET | Freq: Two times a day (BID) | ORAL | 0 refills | Status: DC

## 2016-09-11 MED ORDER — PREDNISONE 50 MG PO TABS: ORAL_TABLET | ORAL | 0 refills | Status: DC

## 2016-09-11 MED ORDER — IPRATROPIUM-ALBUTEROL 0.5-2.5 (3) MG/3ML IN SOLN
3.0000 mL | Freq: Once | RESPIRATORY_TRACT | Status: AC
  Administered 2016-09-11: 3 mL via RESPIRATORY_TRACT

## 2016-09-11 MED ORDER — ALBUTEROL SULFATE HFA 108 (90 BASE) MCG/ACT IN AERS: 2.0000 | INHALATION_SPRAY | RESPIRATORY_TRACT | 0 refills | Status: DC | PRN

## 2016-09-11 MED ORDER — IPRATROPIUM-ALBUTEROL 0.5-2.5 (3) MG/3ML IN SOLN
RESPIRATORY_TRACT | Status: AC
  Filled 2016-09-11: qty 3

## 2016-09-11 NOTE — ED Triage Notes (Signed)
The patient presented to the [facility] with a complaint of cough and congestion 5 days.

## 2016-09-11 NOTE — Discharge Instructions (Signed)
You have bronchitis. Take prednisone as prescribed. Use the albuterol every 4 hours for the next 2 days, then as needed for wheezing or cough. Take Mucinex twice a day for the next 5 days. You should see improvement in the next 3-5 days. If you develop fevers, difficulty breathing, or are just not getting better, please come back or go to the emergency room.

## 2016-09-11 NOTE — ED Provider Notes (Signed)
[place]    CSN: [id number] Arrival date & time: 09/11/16  1020     History   Chief Complaint Chief Complaint  Patient presents with  . Cough    HPI Jeanne [name] is a 81 y.o. female.   HPI She is a 81 year old woman here for evaluation of cough and congestion. Her symptoms started 4 days ago with a sore throat. It then moved into her chest. She reports coughing and "rattling" in her chest. She also reports feeling a little weak and short of breath at times. She has had some night sweats since using Vicks vapor rub, but denies any fevers. No nasal symptoms. No nausea or vomiting. No wheezing. She does report feeling a little dizzy when she stands up, but thinks this is related to a vision issue that her primary is working up.  Past Medical History:  Diagnosis Date  . Anemia   . Arthritis   . Carotid arterial disease ([place])   . Diabetes mellitus   . Hyperlipidemia   . Hypertension   . Migraine   . Obesity   . Reflux   . Sleep apnea     Patient Active Problem List   Diagnosis Date Noted  . Unstable angina ([place]) 10/12/2015  . Abnormal myocardial perfusion study 10/12/2015  . NSVT (nonsustained ventricular tachycardia) ([place]) 10/11/2015  . Essential hypertension 10/11/2015  . CKD (chronic kidney disease), stage III 10/11/2015  . Pain in the chest   . Chest pain 10/08/2015  . Diabetes mellitus type II, non insulin dependent ([place]) 10/08/2015  . AKI (acute kidney injury) ([place])   . Abscess of left arm 01/01/2013  . Migraine   . Obesity   . Sleep apnea   . Carotid arterial disease ([place])   . Reflux   . Anemia     Past Surgical History:  Procedure Laterality Date  . CARDIAC CATHETERIZATION N/A 10/12/2015   Procedure: Left Heart Cath and Coronary Angiography;  Surgeon: Belva Crome, MD;  Location: [facility];  Service: Cardiovascular;  Laterality: N/A;  . CHOLECYSTECTOMY  50 yrs ago  . JOINT REPLACEMENT Right 2005   knee  . WISDOM TOOTH  EXTRACTION      OB History    No data available       Home Medications    Prior to Admission medications   Medication Sig Start Date End Date Taking? Authorizing Provider  aspirin EC 81 MG tablet Take 81 mg by mouth daily.     Yes Historical Provider, MD  diltiazem (TIAZAC) 120 MG 24 hr capsule Take 120 mg by mouth daily.     Yes Historical Provider, MD  enalapril (VASOTEC) 10 MG tablet Take 1 tablet (10 mg total) by mouth daily. 10/18/15  Yes Reyne Dumas, MD  ezetimibe (ZETIA) 10 MG tablet Take 10 mg by mouth every evening.     Yes Historical Provider, MD  hydrochlorothiazide (HYDRODIURIL) 50 MG tablet Take 50 mg by mouth daily.     Yes Historical Provider, MD  insulin glargine (LANTUS) 100 UNIT/ML injection Inject 65 Units into the skin every morning.    Yes Historical Provider, MD  latanoprost (XALATAN) 0.005 % ophthalmic solution Place 1 drop into the left eye at bedtime. 09/26/15  Yes Historical Provider, MD  oxybutynin (DITROPAN-XL) 10 MG 24 hr tablet Take 10 mg by mouth daily.     Yes Historical Provider, MD  pantoprazole (PROTONIX) 40 MG tablet Take 40 mg by mouth daily. 08/25/15  Yes Historical Provider, MD  pravastatin (PRAVACHOL) 40 MG tablet Take 40 mg by mouth daily.    Yes Historical Provider, MD  albuterol (PROVENTIL HFA;VENTOLIN HFA) 108 (90 Base) MCG/ACT inhaler Inhale 2 puffs into the lungs every 4 (four) hours as needed for wheezing or shortness of breath. 09/11/16   Melony Overly, MD  guaiFENesin (MUCINEX) 600 MG 12 hr tablet Take 2 tablets (1,200 mg total) by mouth 2 (two) times daily. For 5 days 09/11/16   Melony Overly, MD  predniSONE (DELTASONE) 50 MG tablet Take 1 pill daily for 5 days. 09/11/16   Melony Overly, MD    Family History Family History  Problem Relation Age of Onset  . Heart attack Father     Social History Social History  Substance Use Topics  . Smoking status: Never Smoker  . Smokeless tobacco: Never Used  . Alcohol use No     Allergies     Amoxicillin and Invokana [canagliflozin]   Review of Systems Review of Systems As in history of present illness  Physical Exam Triage Vital Signs ED Triage Vitals  Enc Vitals Group     BP 09/11/16 1150 157/92     Pulse Rate 09/11/16 1150 87     Resp 09/11/16 1150 20     Temp 09/11/16 1150 98.4 F (36.9 C)     Temp Source 09/11/16 1150 Oral     SpO2 09/11/16 1150 98 %     Weight --      Height --      Head Circumference --      Peak Flow --      Pain Score 09/11/16 1149 0     Pain Loc --      Pain Edu? --      Excl. in [place]? --    No data found.   Updated Vital Signs BP 157/92 (BP Location: Left Wrist)   Pulse 87   Temp 98.4 F (36.9 C) (Oral)   Resp 20   SpO2 98%   Visual Acuity Right Eye Distance:   Left Eye Distance:   Bilateral Distance:    Right Eye Near:   Left Eye Near:    Bilateral Near:     Physical Exam  Constitutional: She is oriented to person, place, and time. She appears well-developed and well-nourished. No distress.  Neck: Neck supple.  Cardiovascular: Normal rate, regular rhythm and normal heart sounds.   No murmur heard. Pulmonary/Chest: Effort normal. No respiratory distress. She has wheezes. She has no rales.  Diffuse wheezes and rhonchi  Neurological: She is alert and oriented to person, place, and time.     UC Treatments / Results  Labs (all labs ordered are listed, but only abnormal results are displayed) Labs Reviewed - No data to display  EKG  EKG Interpretation None       Radiology No results found.  Procedures Procedures (including critical care time)  Medications Ordered in UC Medications  ipratropium-albuterol (DUONEB) 0.5-2.5 (3) MG/3ML nebulizer solution 3 mL (3 mLs Nebulization Given 09/11/16 1242)     Initial Impression / Assessment and Plan / UC Course  I have reviewed the triage vital signs and the nursing notes.  Pertinent labs & imaging results that were available during my care of the patient were  reviewed by me and considered in my medical decision making (see chart for details).  Clinical Course     Patient reports breathing treatment made her cough, but she was able  to get up some mucus. Repeat lung exam shows improvement of wheezes and rhonchi.  Treat for viral bronchitis with prednisone and albuterol inhaler. Recommended Mucinex for the next 5 days. Follow-up as needed.  Final Clinical Impressions(s) / UC Diagnoses   Final diagnoses:  Bronchitis    New Prescriptions New Prescriptions   ALBUTEROL (PROVENTIL HFA;VENTOLIN HFA) 108 (90 BASE) MCG/ACT INHALER    Inhale 2 puffs into the lungs every 4 (four) hours as needed for wheezing or shortness of breath.   GUAIFENESIN (MUCINEX) 600 MG 12 HR TABLET    Take 2 tablets (1,200 mg total) by mouth 2 (two) times daily. For 5 days   PREDNISONE (DELTASONE) 50 MG TABLET    Take 1 pill daily for 5 days.     Melony Overly, MD 09/11/16 1323

## 2016-10-18 ENCOUNTER — Ambulatory Visit (HOSPITAL_COMMUNITY)
Admission: EM | Admit: 2016-10-18 | Discharge: 2016-10-18 | Disposition: A | Discharge status: Home / Self Care | Payer: Medicare Other

## 2016-10-18 ENCOUNTER — Encounter (HOSPITAL_COMMUNITY): Payer: Self-pay | Admitting: Emergency Medicine

## 2016-10-18 DIAGNOSIS — S29011A Strain of muscle and tendon of front wall of thorax, initial encounter: Secondary | ICD-10-CM | POA: Diagnosis not present

## 2016-10-18 NOTE — ED Triage Notes (Signed)
The patient presented to the [facility] with a complaint of a follow up to a diagnosis of pneumonia that was done by her PCP. The patient stated that she had a follow with that PCP today and he is now no longer available.

## 2016-10-18 NOTE — ED Provider Notes (Signed)
CSN: [id number]     Arrival date & time 10/18/16  1020 History   None    Chief Complaint  Patient presents with  . Follow-up   (Consider location/radiation/quality/duration/timing/severity/associated sxs/prior Treatment) Patient was seen a few weeks ago for viral bronchitis.  She states she went to see her PCP and was dx with walking pneumonia.  She states she was going to make a follow up appointment with him but he has been hospitalized and is not available.  She c/o left posterior thoracic back pain.  She denies cough at present. She denies fever.     The history is provided by the patient.  URI  Presenting symptoms: cough   Severity:  Mild Onset quality:  Gradual Duration:  2 weeks Timing:  Intermittent Progression:  Resolved Chronicity:  New Relieved by:  Prescription medications, OTC medications and rest Worsened by:  Nothing Ineffective treatments:  None tried Associated symptoms: arthralgias and myalgias   Risk factors: being elderly, chronic kidney disease, diabetes mellitus and recent illness     Past Medical History:  Diagnosis Date  . Anemia   . Arthritis   . Carotid arterial disease ([place])   . Diabetes mellitus   . Hyperlipidemia   . Hypertension   . Migraine   . Obesity   . Reflux   . Sleep apnea    Past Surgical History:  Procedure Laterality Date  . CARDIAC CATHETERIZATION N/A 10/12/2015   Procedure: Left Heart Cath and Coronary Angiography;  Surgeon: Belva Crome, MD;  Location: [facility];  Service: Cardiovascular;  Laterality: N/A;  . CHOLECYSTECTOMY  50 yrs ago  . JOINT REPLACEMENT Right 2005   knee  . WISDOM TOOTH EXTRACTION     Family History  Problem Relation Age of Onset  . Heart attack Father    Social History  Substance Use Topics  . Smoking status: Never Smoker  . Smokeless tobacco: Never Used  . Alcohol use No   OB History    No data available     Review of Systems  Constitutional: Negative.   HENT: Negative.   Eyes:  Negative.   Respiratory: Positive for cough.   Cardiovascular: Negative.   Gastrointestinal: Negative.   Endocrine: Negative.   Genitourinary: Negative.   Musculoskeletal: Positive for arthralgias, back pain and myalgias.  Skin: Negative.   Allergic/Immunologic: Negative.   Neurological: Negative.   Hematological: Negative.   Psychiatric/Behavioral: Negative.     Allergies  Amoxicillin and Invokana [canagliflozin]  Home Medications   Prior to Admission medications   Medication Sig Start Date End Date Taking? Authorizing Provider  albuterol (PROVENTIL HFA;VENTOLIN HFA) 108 (90 Base) MCG/ACT inhaler Inhale 2 puffs into the lungs every 4 (four) hours as needed for wheezing or shortness of breath. 09/11/16   Melony Overly, MD  aspirin EC 81 MG tablet Take 81 mg by mouth daily.      Historical Provider, MD  diltiazem (TIAZAC) 120 MG 24 hr capsule Take 120 mg by mouth daily.      Historical Provider, MD  enalapril (VASOTEC) 10 MG tablet Take 1 tablet (10 mg total) by mouth daily. 10/18/15   Reyne Dumas, MD  ezetimibe (ZETIA) 10 MG tablet Take 10 mg by mouth every evening.      Historical Provider, MD  guaiFENesin (MUCINEX) 600 MG 12 hr tablet Take 2 tablets (1,200 mg total) by mouth 2 (two) times daily. For 5 days 09/11/16   Melony Overly, MD  hydrochlorothiazide (HYDRODIURIL) 50  MG tablet Take 50 mg by mouth daily.      Historical Provider, MD  insulin glargine (LANTUS) 100 UNIT/ML injection Inject 65 Units into the skin every morning.     Historical Provider, MD  latanoprost (XALATAN) 0.005 % ophthalmic solution Place 1 drop into the left eye at bedtime. 09/26/15   Historical Provider, MD  oxybutynin (DITROPAN-XL) 10 MG 24 hr tablet Take 10 mg by mouth daily.      Historical Provider, MD  pantoprazole (PROTONIX) 40 MG tablet Take 40 mg by mouth daily. 08/25/15   Historical Provider, MD  pravastatin (PRAVACHOL) 40 MG tablet Take 40 mg by mouth daily.     Historical Provider, MD   Meds Ordered  and Administered this Visit  Medications - No data to display  BP 158/67 (BP Location: Right Wrist)   Pulse 88   Temp 98.5 F (36.9 C) (Oral)   Resp 20   SpO2 100%  No data found.   Physical Exam  Constitutional: She is oriented to person, place, and time. She appears well-developed and well-nourished.  HENT:  Head: Normocephalic and atraumatic.  Eyes: EOM are normal. Pupils are equal, round, and reactive to light.  Neck: Normal range of motion. Neck supple.  Cardiovascular: Normal rate, regular rhythm and normal heart sounds.   Pulmonary/Chest: Effort normal and breath sounds normal.  Musculoskeletal: She exhibits tenderness.  Left thoracic muscle tenderness.  Neurological: She is alert and oriented to person, place, and time.  Nursing note and vitals reviewed.   Urgent Care Course     Procedures (including critical care time)  Labs Review Labs Reviewed - No data to display  Imaging Review No results found.   Visual Acuity Review  Right Eye Distance:   Left Eye Distance:   Bilateral Distance:    Right Eye Near:   Left Eye Near:    Bilateral Near:         MDM   1. Intercostal muscle strain, initial encounter    Advised to take tylenol and motrin otc as directed prn pain. Explained URI sx's have resolved and there is no pneumonia or bronchitis seen with exam.  Follow up with PCP      Lysbeth Penner, FNP 10/18/16 [phone]

## 2016-10-18 NOTE — Discharge Instructions (Signed)
Your Bronchitis is resolved and your back pain is over the left posterior intercostals.  You can take tylenol and motrin over the counter as directed for discomfort.  Your exam did not reveal any pneumonia and your viral bronchitis has resolved.

## 2016-11-17 ENCOUNTER — Other Ambulatory Visit: Payer: Self-pay | Admitting: Family Medicine

## 2016-11-17 ENCOUNTER — Ambulatory Visit
Admission: RE | Admit: 2016-11-17 | Discharge: 2016-11-17 | Disposition: A | Discharge status: Home / Self Care | Payer: Medicare Other | Source: Ambulatory Visit | Attending: Family Medicine | Admitting: Family Medicine

## 2016-11-17 DIAGNOSIS — R079 Chest pain, unspecified: Secondary | ICD-10-CM

## 2016-11-20 ENCOUNTER — Other Ambulatory Visit: Payer: Self-pay | Admitting: Family Medicine

## 2016-11-20 DIAGNOSIS — R918 Other nonspecific abnormal finding of lung field: Secondary | ICD-10-CM

## 2016-11-21 ENCOUNTER — Ambulatory Visit
Admission: RE | Admit: 2016-11-21 | Discharge: 2016-11-21 | Disposition: A | Discharge status: Home / Self Care | Payer: Medicare Other | Source: Ambulatory Visit | Attending: Family Medicine | Admitting: Family Medicine

## 2016-11-21 DIAGNOSIS — R918 Other nonspecific abnormal finding of lung field: Secondary | ICD-10-CM

## 2016-11-21 MED ORDER — IOPAMIDOL (ISOVUE-300) INJECTION 61%
75.0000 mL | Freq: Once | INTRAVENOUS | Status: AC | PRN
  Administered 2016-11-21: 75 mL via INTRAVENOUS

## 2017-04-09 ENCOUNTER — Ambulatory Visit (HOSPITAL_COMMUNITY)
Admission: EM | Admit: 2017-04-09 | Discharge: 2017-04-09 | Disposition: A | Discharge status: Home / Self Care | Payer: Medicare Other | Attending: Internal Medicine | Admitting: Internal Medicine

## 2017-04-09 ENCOUNTER — Encounter (HOSPITAL_COMMUNITY): Payer: Self-pay | Admitting: Emergency Medicine

## 2017-04-09 DIAGNOSIS — T2124XA Burn of second degree of lower back, initial encounter: Secondary | ICD-10-CM | POA: Diagnosis not present

## 2017-04-09 DIAGNOSIS — T300 Burn of unspecified body region, unspecified degree: Secondary | ICD-10-CM

## 2017-04-09 DIAGNOSIS — X110XXA Contact with hot water in bath or tub, initial encounter: Secondary | ICD-10-CM | POA: Diagnosis not present

## 2017-04-09 DIAGNOSIS — S30820A Blister (nonthermal) of lower back and pelvis, initial encounter: Secondary | ICD-10-CM | POA: Diagnosis not present

## 2017-04-09 MED ORDER — SILVER SULFADIAZINE 1 % EX CREA
TOPICAL_CREAM | CUTANEOUS | Status: AC
  Filled 2017-04-09: qty 85

## 2017-04-09 MED ORDER — SILVER SULFADIAZINE 1 % EX CREA
TOPICAL_CREAM | Freq: Every day | CUTANEOUS | Status: DC
  Administered 2017-04-09: 1 via TOPICAL

## 2017-04-09 NOTE — ED Provider Notes (Signed)
CSN: [id number]     Arrival date & time 04/09/17  1107 History   None    Chief Complaint  Patient presents with  . Burn   (Consider location/radiation/quality/duration/timing/severity/associated sxs/prior Treatment) 81 year old female with history of CAD, diabetes, hyperlipidemia, hypertension, comes in for four-day history of burn with blister. States she traveled recently, and had some low back pain after getting back. She felt a water back up with hot water to apply to affected area, and fell asleep with it on her back. She woke up with burn blisters on her back. The states that her muscle pain is now gone. Denies increased surrounding erythema, warmth, pain, fever. Her diabetes is well controlled, she has been on a new diet for the past 3 weeks, with her blood glucose 90s to 110s.      Past Medical History:  Diagnosis Date  . Anemia   . Arthritis   . Carotid arterial disease ([place])   . Diabetes mellitus   . Hyperlipidemia   . Hypertension   . Migraine   . Obesity   . Reflux   . Sleep apnea    Past Surgical History:  Procedure Laterality Date  . CARDIAC CATHETERIZATION N/A 10/12/2015   Procedure: Left Heart Cath and Coronary Angiography;  Surgeon: Belva Crome, MD;  Location: [facility];  Service: Cardiovascular;  Laterality: N/A;  . CHOLECYSTECTOMY  50 yrs ago  . JOINT REPLACEMENT Right 2005   knee  . WISDOM TOOTH EXTRACTION     Family History  Problem Relation Age of Onset  . Heart attack Father    Social History  Substance Use Topics  . Smoking status: Never Smoker  . Smokeless tobacco: Never Used  . Alcohol use No   OB History    No data available     Review of Systems  Constitutional: Negative for chills, diaphoresis, fatigue and fever.  Neurological: Negative for dizziness, tremors, weakness, light-headedness and headaches.    Allergies  Amoxicillin and Invokana [canagliflozin]  Home Medications   Prior to Admission medications   Medication  Sig Start Date End Date Taking? Authorizing Provider  albuterol (PROVENTIL HFA;VENTOLIN HFA) 108 (90 Base) MCG/ACT inhaler Inhale 2 puffs into the lungs every 4 (four) hours as needed for wheezing or shortness of breath. 09/11/16   Melony Overly, MD  aspirin EC 81 MG tablet Take 81 mg by mouth daily.      [provider]  diltiazem (TIAZAC) 120 MG 24 hr capsule Take 120 mg by mouth daily.      [provider]  enalapril (VASOTEC) 10 MG tablet Take 1 tablet (10 mg total) by mouth daily. 10/18/15   Reyne Dumas, MD  ezetimibe (ZETIA) 10 MG tablet Take 10 mg by mouth every evening.      [provider]  guaiFENesin (MUCINEX) 600 MG 12 hr tablet Take 2 tablets (1,200 mg total) by mouth 2 (two) times daily. For 5 days 09/11/16   Melony Overly, MD  hydrochlorothiazide (HYDRODIURIL) 50 MG tablet Take 50 mg by mouth daily.      [provider]  insulin glargine (LANTUS) 100 UNIT/ML injection Inject 65 Units into the skin every morning.     [provider]  latanoprost (XALATAN) 0.005 % ophthalmic solution Place 1 drop into the left eye at bedtime. 09/26/15   [provider]  oxybutynin (DITROPAN-XL) 10 MG 24 hr tablet Take 10 mg by mouth daily.      [provider]  pantoprazole (PROTONIX) 40 MG tablet Take 40 mg by mouth daily. 08/25/15   [provider]  pravastatin (PRAVACHOL) 40 MG tablet Take 40 mg by mouth daily.     [provider]   Meds Ordered and Administered this Visit   Medications  silver sulfADIAZINE (SILVADENE) 1 % cream (not administered)    BP (!) 164/68 (BP Location: Right Arm)   Pulse 99   Temp 98.1 F (36.7 C) (Oral)   Resp 20   SpO2 97%  No data found.   Physical Exam  Constitutional: She is oriented to person, place, and time. She appears well-developed and well-nourished. No distress.  HENT:  Head: Normocephalic and atraumatic.  Neurological: She is alert and oriented to person, place, and  time.  Skin:  Multiple blisters seen on left lower back. Specific ruptured blisters ranging from 0.5 cm to 2 cm. No surrounding erythema, increased warmth. No discharge.    Urgent Care Course     Procedures (including critical care time)  Labs Review Labs Reviewed - No data to display  Imaging Review No results found.     MDM   1. Skin burn    Discussed with patient no signs of bacterial infection at this visit. Use Silvadene on affected area, dressed with nonadherent gauze. Patient to keep area clean and dry. Congratulated patient on recent change in diet and improved glucose control, encouraged continuation, discussed better wound healing with better glucose control. Monitor for increased erythema, warmth, pain, fever, to follow up here or with PCP for reevaluation.   Ok Edwards, PA-C 04/09/17 1234

## 2017-04-09 NOTE — ED Triage Notes (Signed)
The patient presented to the [facility] with a complaint of blisters from a burn on her back. The patient stated that she put a hot water bottle on her back.

## 2017-04-09 NOTE — Discharge Instructions (Signed)
Put silvadene on your open blisters. Dress with nonadherent gauze. You can start taking showers in 2 days, do not scrub affected area. Gently dry area and apply cream as directed. Monitor for increase surrounding redness, increased warmth, increased pain, or fever, to follow up here or with your PCP for reevaluation. Continue excellent work of diet and glucose control, this will help with wound healing.

## 2018-02-26 ENCOUNTER — Other Ambulatory Visit: Payer: Self-pay | Admitting: Internal Medicine

## 2018-02-26 ENCOUNTER — Ambulatory Visit
Admission: RE | Admit: 2018-02-26 | Discharge: 2018-02-26 | Disposition: A | Discharge status: Home / Self Care | Payer: Medicare Other | Source: Ambulatory Visit | Attending: Internal Medicine | Admitting: Internal Medicine

## 2018-02-26 DIAGNOSIS — R0602 Shortness of breath: Secondary | ICD-10-CM

## 2018-02-27 ENCOUNTER — Other Ambulatory Visit (HOSPITAL_COMMUNITY): Payer: Self-pay | Admitting: Internal Medicine

## 2018-02-27 ENCOUNTER — Ambulatory Visit (HOSPITAL_COMMUNITY)
Admission: RE | Admit: 2018-02-27 | Discharge: 2018-02-27 | Disposition: A | Discharge status: Home / Self Care | Payer: Medicare Other | Source: Ambulatory Visit | Attending: Internal Medicine | Admitting: Internal Medicine

## 2018-02-27 DIAGNOSIS — R0602 Shortness of breath: Secondary | ICD-10-CM | POA: Insufficient documentation

## 2018-02-27 DIAGNOSIS — R609 Edema, unspecified: Secondary | ICD-10-CM | POA: Diagnosis not present

## 2018-02-27 NOTE — Progress Notes (Signed)
*  Preliminary Results* Bilateral lower extremity venous duplex completed. There is no obvious evidence of deep vein thrombosis involving bilateral lower extremities. There is no evidence of Baker's cyst bilaterally.  02/27/2018 10:25 AM Maudry Mayhew, BS, RVT, RDCS, RDMS

## 2018-04-01 ENCOUNTER — Emergency Department (HOSPITAL_COMMUNITY)
Admission: EM | Admit: 2018-04-01 | Discharge: 2018-04-01 | Disposition: A | Discharge status: Home / Self Care | Payer: Medicare Other | Attending: Emergency Medicine | Admitting: Emergency Medicine

## 2018-04-01 ENCOUNTER — Other Ambulatory Visit: Payer: Self-pay

## 2018-04-01 ENCOUNTER — Emergency Department (HOSPITAL_COMMUNITY): Payer: Medicare Other

## 2018-04-01 ENCOUNTER — Encounter (HOSPITAL_COMMUNITY): Payer: Self-pay | Admitting: Emergency Medicine

## 2018-04-01 DIAGNOSIS — R0602 Shortness of breath: Secondary | ICD-10-CM | POA: Diagnosis present

## 2018-04-01 DIAGNOSIS — N183 Chronic kidney disease, stage 3 (moderate): Secondary | ICD-10-CM | POA: Insufficient documentation

## 2018-04-01 DIAGNOSIS — E1122 Type 2 diabetes mellitus with diabetic chronic kidney disease: Secondary | ICD-10-CM | POA: Diagnosis not present

## 2018-04-01 DIAGNOSIS — I129 Hypertensive chronic kidney disease with stage 1 through stage 4 chronic kidney disease, or unspecified chronic kidney disease: Secondary | ICD-10-CM | POA: Insufficient documentation

## 2018-04-01 DIAGNOSIS — Z96651 Presence of right artificial knee joint: Secondary | ICD-10-CM | POA: Insufficient documentation

## 2018-04-01 LAB — COMPREHENSIVE METABOLIC PANEL
ALT: 13 U/L (ref 0–44)
AST: 20 U/L (ref 15–41)
Albumin: 3.1 g/dL — ABNORMAL LOW (ref 3.5–5.0)
Alkaline Phosphatase: 72 U/L (ref 38–126)
Anion gap: 10 (ref 5–15)
BILIRUBIN TOTAL: 0.4 mg/dL (ref 0.3–1.2)
BUN: 27 mg/dL — ABNORMAL HIGH (ref 8–23)
CALCIUM: 9.2 mg/dL (ref 8.9–10.3)
CO2: 31 mmol/L (ref 22–32)
Chloride: 100 mmol/L (ref 98–111)
Creatinine, Ser: 1.55 mg/dL — ABNORMAL HIGH (ref 0.44–1.00)
GFR calc Af Amer: 35 mL/min — ABNORMAL LOW (ref >60)
GFR calc non Af Amer: 30 mL/min — ABNORMAL LOW (ref >60)
Glucose, Bld: 255 mg/dL — ABNORMAL HIGH (ref 70–99)
Potassium: 3.7 mmol/L (ref 3.5–5.1)
Sodium: 141 mmol/L (ref 135–145)
TOTAL PROTEIN: 7.2 g/dL (ref 6.5–8.1)

## 2018-04-01 LAB — I-STAT TROPONIN, ED: Troponin i, poc: 0 ng/mL (ref 0.00–0.08)

## 2018-04-01 LAB — CBC WITH DIFFERENTIAL/PLATELET
BASOS PCT: 0 %
Basophils Absolute: 0 10*3/uL (ref 0.0–0.1)
Eosinophils Absolute: 0.1 10*3/uL (ref 0.0–0.7)
Eosinophils Relative: 2 %
HEMATOCRIT: 34.2 % — AB (ref 36.0–46.0)
Hemoglobin: 10.6 g/dL — ABNORMAL LOW (ref 12.0–15.0)
Lymphocytes Relative: 24 %
Lymphs Abs: 1.4 10*3/uL (ref 0.7–4.0)
MCH: 26.1 pg (ref 26.0–34.0)
MCHC: 31 g/dL (ref 30.0–36.0)
MCV: 84.2 fL (ref 78.0–100.0)
MONO ABS: 0.4 10*3/uL (ref 0.1–1.0)
MONOS PCT: 8 %
Neutro Abs: 3.7 10*3/uL (ref 1.7–7.7)
Neutrophils Relative %: 66 %
Platelets: 233 10*3/uL (ref 150–400)
RBC: 4.06 MIL/uL (ref 3.87–5.11)
RDW: 14.5 % (ref 11.5–15.5)
WBC: 5.7 10*3/uL (ref 4.0–10.5)

## 2018-04-01 LAB — BRAIN NATRIURETIC PEPTIDE: B Natriuretic Peptide: 69.6 pg/mL (ref 0.0–100.0)

## 2018-04-01 MED ORDER — ALBUTEROL SULFATE (2.5 MG/3ML) 0.083% IN NEBU: 5.0000 mg | INHALATION_SOLUTION | Freq: Once | RESPIRATORY_TRACT | Status: DC

## 2018-04-01 MED ORDER — IOPAMIDOL (ISOVUE-370) INJECTION 76%
INTRAVENOUS | Status: DC
Start: 2018-04-01 — End: 2018-04-01
  Filled 2018-04-01: qty 100

## 2018-04-01 MED ORDER — IOPAMIDOL (ISOVUE-370) INJECTION 76%
100.0000 mL | Freq: Once | INTRAVENOUS | Status: AC | PRN
  Administered 2018-04-01: 100 mL via INTRAVENOUS

## 2018-04-01 NOTE — Discharge Instructions (Signed)
You were seen in the ED today with shortness of breath. Your labs, x-ray, and CT scan of the chest are normal. Call your PCP and Cardiologist to schedule follow up appointments. Return to the ED with any worsening trouble breathing or chest pain.

## 2018-04-01 NOTE — ED Provider Notes (Signed)
Emergency Department Provider Note   I have reviewed the triage vital signs and the nursing notes.   HISTORY  Chief Complaint Shortness of Breath   HPI Jeanne [name] is a 82 y.o. female with PMH of CAD, DM, HLD, and HTN presents to the emergency department for evaluation of progressively worsening exertional dyspnea.  Patient denies chest pain.  Symptoms have been worsening over the past 2 weeks.  She saw her primary care physician, Dr. Delfina Redwood, on Friday who sent lab tests and scheduled a follow-up appointment for this week.  She was called this morning to tell her to present to the emergency department but she is unsure why.  The patient states she did not personally take the call but it was handled by her daughter.  She is not aware of any abnormal lab tests or x-rays.  States her symptoms do not significantly worsen over the weekend.  No fevers, chills, productive cough.  Past Medical History:  Diagnosis Date  . Anemia   . Arthritis   . Carotid arterial disease ([place])   . Diabetes mellitus   . Hyperlipidemia   . Hypertension   . Migraine   . Obesity   . Reflux   . Sleep apnea     Patient Active Problem List   Diagnosis Date Noted  . Unstable angina ([place]) 10/12/2015  . Abnormal myocardial perfusion study 10/12/2015  . NSVT (nonsustained ventricular tachycardia) ([place]) 10/11/2015  . Essential hypertension 10/11/2015  . CKD (chronic kidney disease), stage III ([place]) 10/11/2015  . Pain in the chest   . Chest pain 10/08/2015  . Diabetes mellitus type II, non insulin dependent ([place]) 10/08/2015  . AKI (acute kidney injury) ([place])   . Abscess of left arm 01/01/2013  . Migraine   . Obesity   . Sleep apnea   . Carotid arterial disease ([place])   . Reflux   . Anemia     Past Surgical History:  Procedure Laterality Date  . CARDIAC CATHETERIZATION N/A 10/12/2015   Procedure: Left Heart Cath and Coronary Angiography;  Surgeon: Belva Crome, MD;  Location: [facility];   Service: Cardiovascular;  Laterality: N/A;  . CHOLECYSTECTOMY  50 yrs ago  . JOINT REPLACEMENT Right 2005   knee  . WISDOM TOOTH EXTRACTION       Allergies Amoxicillin and Invokana [canagliflozin]  Family History  Problem Relation Age of Onset  . Heart attack Father     Social History Social History   Tobacco Use  . Smoking status: Never Smoker  . Smokeless tobacco: Never Used  Substance Use Topics  . Alcohol use: No  . Drug use: No    Review of Systems  Constitutional: No fever/chills Eyes: No visual changes. ENT: No sore throat. Cardiovascular: Denies chest pain. Respiratory: Positive shortness of breath with exertion.  Gastrointestinal: No abdominal pain.  No nausea, no vomiting.  No diarrhea.  No constipation. Genitourinary: Negative for dysuria. Musculoskeletal: Negative for back pain. Skin: Negative for rash. Neurological: Negative for headaches, focal weakness or numbness.  10-point ROS otherwise negative.  ____________________________________________   PHYSICAL EXAM:  VITAL SIGNS: ED Triage Vitals  Enc Vitals Group     BP 04/01/18 1134 140/78     Pulse Rate 04/01/18 1134 84     Resp 04/01/18 1134 16     Temp 04/01/18 1134 97.7 F (36.5 C)     Temp Source 04/01/18 1134 Oral     SpO2 04/01/18 1134 100 %  Weight 04/01/18 1134 (!) 340 lb (154.2 kg)     Height 04/01/18 1134 5\' 3"  (1.6 m)     Pain Score 04/01/18 1136 10    Constitutional: Alert and oriented. Well appearing and in no acute distress. Eyes: Conjunctivae are normal. Head: Atraumatic. Nose: No congestion/rhinnorhea. Mouth/Throat: Mucous membranes are moist.  Neck: No stridor.   Cardiovascular: Normal rate, regular rhythm. Good peripheral circulation. Grossly normal heart sounds.   Respiratory: Normal respiratory effort.  No retractions. Lungs CTAB. Gastrointestinal: Soft and nontender. No distention.  Musculoskeletal: No lower extremity tenderness with only trace ankle edema. No  gross deformities of extremities. Neurologic:  Normal speech and language. No gross focal neurologic deficits are appreciated.  Skin:  Skin is warm, dry and intact. No rash noted.   ____________________________________________   LABS (all labs ordered are listed, but only abnormal results are displayed)  Labs Reviewed  COMPREHENSIVE METABOLIC PANEL - Abnormal; Notable for the following components:      Result Value   Glucose, Bld 255 (*)    BUN 27 (*)    Creatinine, Ser 1.55 (*)    Albumin 3.1 (*)    GFR calc non Af Amer 30 (*)    GFR calc Af Amer 35 (*)    All other components within normal limits  CBC WITH DIFFERENTIAL/PLATELET - Abnormal; Notable for the following components:   Hemoglobin 10.6 (*)    HCT 34.2 (*)    All other components within normal limits  BRAIN NATRIURETIC PEPTIDE  I-STAT TROPONIN, ED   ____________________________________________  EKG   EKG Interpretation  Date/Time:  Monday April 01 2018 11:42:18 EDT Ventricular Rate:  88 PR Interval:    QRS Duration: 93 QT Interval:  400 QTC Calculation: 484 R Axis:   -71 Text Interpretation:  Sinus rhythm Left anterior fascicular block Low voltage, precordial leads Consider anterior infarct No STEMI. Similar to 2017 tracing.  Confirmed by Nanda Quinton [phone]) on 04/01/2018 11:46:00 AM       ____________________________________________  RADIOLOGY  Dg Chest 2 View  Result Date: 04/01/2018 CLINICAL DATA:  Shortness of breath for 2 months EXAM: CHEST - 2 VIEW COMPARISON:  02/26/2018 FINDINGS: Cardiac shadow is within normal limits. Aortic calcifications are again seen. The lungs are clear without focal infiltrate. No bony abnormality is noted. IMPRESSION: No active cardiopulmonary disease. Electronically Signed   By: Inez Catalina M.D.   On: 04/01/2018 13:11   Ct Angio Chest Pe W And/or Wo Contrast  Result Date: 04/01/2018 CLINICAL DATA:  Intermittent shortness of breath on exertion for 2 months. EXAM: CT  ANGIOGRAPHY CHEST WITH CONTRAST TECHNIQUE: Multidetector CT imaging of the chest was performed using the standard protocol during bolus administration of intravenous contrast. Multiplanar CT image reconstructions and MIPs were obtained to evaluate the vascular anatomy. CONTRAST:  100 ml ISOVUE-370 IOPAMIDOL (ISOVUE-370) INJECTION 76% COMPARISON:  CT chest 11/21/2016. PA and lateral chest this same day. FINDINGS: Cardiovascular: No pulmonary embolus is identified. There is mild cardiomegaly. No pericardial effusion. Calcific aortic and coronary atherosclerosis is noted. Mediastinum/Nodes: No enlarged mediastinal, hilar, or axillary lymph nodes. Thyroid gland, trachea, and esophagus demonstrate no significant findings. Lungs/Pleura: No pleural effusion. Mild dependent atelectasis noted. Lungs are otherwise clear. Upper Abdomen: Cyst upper pole right kidney is partially imaged. No acute abnormality. Musculoskeletal: No lytic or sclerotic lesion. Multilevel degenerative disease of the thoracic spine is noted. Review of the MIP images confirms the above findings. IMPRESSION: Negative for pulmonary embolus.  No acute disease. Calcific coronary  artery disease. Mild cardiomegaly. Thoracic spondylosis. Aortic Atherosclerosis (ICD10-I70.0). Electronically Signed   By: Inge Rise M.D.   On: 04/01/2018 15:00    ____________________________________________   PROCEDURES  Procedure(s) performed:   Procedures  None ____________________________________________   INITIAL IMPRESSION / ASSESSMENT AND PLAN / ED COURSE  Pertinent labs & imaging results that were available during my care of the patient were reviewed by me and considered in my medical decision making (see chart for details).  Patient presents to the emergency department for evaluation of exertional dyspnea.  I was able to obtain a copy of the lab work performed by the patient's primary care physician.  It does seem that the patient's d-dimer is  0.58 which, if age-adjusted, would be normal.  Plan for repeat lab testing and chest x-ray.  Labs and CXR reviewed. No acute findings. Patient with negative CTA of the chest. Advised close Cardiology follow up and PCP follow up for continued w/u. Extremely low suspicion for atypical ACS. No hypoxemia or wheezing.   At this time, I do not feel there is any life-threatening condition present. I have reviewed and discussed all results (EKG, imaging, lab, urine as appropriate), exam findings with patient. I have reviewed nursing notes and appropriate previous records.  I feel the patient is safe to be discharged home without further emergent workup. Discussed usual and customary return precautions. Patient and family (if present) verbalize understanding and are comfortable with this plan.  Patient will follow-up with their primary care provider. If they do not have a primary care provider, information for follow-up has been provided to them. All questions have been answered.  ____________________________________________  FINAL CLINICAL IMPRESSION(S) / ED DIAGNOSES  Final diagnoses:  Shortness of breath     MEDICATIONS GIVEN DURING THIS VISIT:  Medications  iopamidol (ISOVUE-370) 76 % injection 100 mL (100 mLs Intravenous Contrast Given 04/01/18 1419)    Note:  This document was prepared using Dragon voice recognition software and may include unintentional dictation errors.  Nanda Quinton, MD Emergency Medicine    Long, [facility], MD 04/01/18 [phone]

## 2018-04-01 NOTE — ED Notes (Signed)
Patient transported to CT 

## 2018-04-01 NOTE — ED Triage Notes (Signed)
Patient c/o intermittent SOC on exertion x2 months. Denies chest pain and cough. Sent from [facility] today for further evaluation. Ambulatory with cane. Speaking in full sentences.

## 2018-08-06 ENCOUNTER — Emergency Department (HOSPITAL_COMMUNITY): Payer: Medicare Other

## 2018-08-06 ENCOUNTER — Emergency Department (HOSPITAL_COMMUNITY)
Admission: EM | Admit: 2018-08-06 | Discharge: 2018-08-06 | Disposition: A | Discharge status: Home / Self Care | Payer: Medicare Other | Attending: Emergency Medicine | Admitting: Emergency Medicine

## 2018-08-06 ENCOUNTER — Encounter (HOSPITAL_COMMUNITY): Payer: Self-pay | Admitting: Emergency Medicine

## 2018-08-06 DIAGNOSIS — Z7982 Long term (current) use of aspirin: Secondary | ICD-10-CM | POA: Insufficient documentation

## 2018-08-06 DIAGNOSIS — R072 Precordial pain: Secondary | ICD-10-CM | POA: Insufficient documentation

## 2018-08-06 DIAGNOSIS — E1122 Type 2 diabetes mellitus with diabetic chronic kidney disease: Secondary | ICD-10-CM | POA: Insufficient documentation

## 2018-08-06 DIAGNOSIS — E785 Hyperlipidemia, unspecified: Secondary | ICD-10-CM | POA: Diagnosis not present

## 2018-08-06 DIAGNOSIS — R0789 Other chest pain: Secondary | ICD-10-CM

## 2018-08-06 DIAGNOSIS — N183 Chronic kidney disease, stage 3 (moderate): Secondary | ICD-10-CM | POA: Diagnosis not present

## 2018-08-06 DIAGNOSIS — R079 Chest pain, unspecified: Secondary | ICD-10-CM | POA: Diagnosis present

## 2018-08-06 DIAGNOSIS — Z79899 Other long term (current) drug therapy: Secondary | ICD-10-CM | POA: Insufficient documentation

## 2018-08-06 DIAGNOSIS — I129 Hypertensive chronic kidney disease with stage 1 through stage 4 chronic kidney disease, or unspecified chronic kidney disease: Secondary | ICD-10-CM | POA: Insufficient documentation

## 2018-08-06 DIAGNOSIS — I209 Angina pectoris, unspecified: Secondary | ICD-10-CM | POA: Insufficient documentation

## 2018-08-06 DIAGNOSIS — Z794 Long term (current) use of insulin: Secondary | ICD-10-CM | POA: Diagnosis not present

## 2018-08-06 DIAGNOSIS — I208 Other forms of angina pectoris: Secondary | ICD-10-CM

## 2018-08-06 LAB — CBC
HCT: 36.4 % (ref 36.0–46.0)
HEMOGLOBIN: 11.1 g/dL — AB (ref 12.0–15.0)
MCH: 25.6 pg — AB (ref 26.0–34.0)
MCHC: 30.5 g/dL (ref 30.0–36.0)
MCV: 83.9 fL (ref 80.0–100.0)
NRBC: 0 % (ref 0.0–0.2)
PLATELETS: 238 10*3/uL (ref 150–400)
RBC: 4.34 MIL/uL (ref 3.87–5.11)
RDW: 14.3 % (ref 11.5–15.5)
WBC: 6.8 10*3/uL (ref 4.0–10.5)

## 2018-08-06 LAB — I-STAT TROPONIN, ED
TROPONIN I, POC: 0.01 ng/mL (ref 0.00–0.08)
Troponin i, poc: 0 ng/mL (ref 0.00–0.08)
Troponin i, poc: 0.05 ng/mL (ref 0.00–0.08)

## 2018-08-06 LAB — BASIC METABOLIC PANEL
ANION GAP: 15 (ref 5–15)
BUN: 21 mg/dL (ref 8–23)
CALCIUM: 9.1 mg/dL (ref 8.9–10.3)
CO2: 27 mmol/L (ref 22–32)
CREATININE: 1.62 mg/dL — AB (ref 0.44–1.00)
Chloride: 98 mmol/L (ref 98–111)
GFR calc Af Amer: 34 mL/min — ABNORMAL LOW (ref >60)
GFR, EST NON AFRICAN AMERICAN: 30 mL/min — AB (ref >60)
GLUCOSE: 86 mg/dL (ref 70–99)
Potassium: 3.1 mmol/L — ABNORMAL LOW (ref 3.5–5.1)
Sodium: 140 mmol/L (ref 135–145)

## 2018-08-06 LAB — MAGNESIUM: MAGNESIUM: 2.1 mg/dL (ref 1.7–2.4)

## 2018-08-06 MED ORDER — FENTANYL CITRATE (PF) 100 MCG/2ML IJ SOLN
50.0000 ug | Freq: Once | INTRAMUSCULAR | Status: AC
  Administered 2018-08-06: 50 ug via INTRAVENOUS
  Filled 2018-08-06: qty 2

## 2018-08-06 MED ORDER — NITROGLYCERIN 0.4 MG SL SUBL
0.4000 mg | SUBLINGUAL_TABLET | SUBLINGUAL | Status: AC
  Administered 2018-08-06: 0.4 mg via SUBLINGUAL
  Filled 2018-08-06: qty 1

## 2018-08-06 MED ORDER — ALUM & MAG HYDROXIDE-SIMETH 200-200-20 MG/5ML PO SUSP
30.0000 mL | Freq: Once | ORAL | Status: AC
  Administered 2018-08-06: 30 mL via ORAL
  Filled 2018-08-06: qty 30

## 2018-08-06 MED ORDER — GI COCKTAIL ~~LOC~~
30.0000 mL | Freq: Three times a day (TID) | ORAL | Status: DC | PRN
  Filled 2018-08-06: qty 30

## 2018-08-06 MED ORDER — LIDOCAINE VISCOUS HCL 2 % MT SOLN
15.0000 mL | Freq: Once | OROMUCOSAL | Status: AC
  Administered 2018-08-06: 15 mL via ORAL
  Filled 2018-08-06: qty 15

## 2018-08-06 NOTE — H&P (Signed)
Jeanne [name] is an 82 y.o. female.   Chief Complaint: Dyspnea on exertion and chest pain HPI: Jeanne [name]  is a 82 y.o. female  With moderate coronary artery disease by cardiac catheterization on 10/13/2015 revealing mid LAD 50%, D1 70%, D2 75% and distal circumflex 40% with diffusely diseased mild RCA stenosis, morbid obesity, hypertension, hyperlipidemia, diabetes mellitus with diabetic nephropathy and peripheral neuropathy who presents to the emergency room with gradually worsening dyspnea on exertion and also chest discomfort.  Patient was in her usual state of health until this morning she woke up around 6 AM feeling chest tightness and also mild shortness of breath.  No other associated symptoms.  Pain is described as dull pressure-like sensation sometimes burning sensation in the chest.  No radiation.  No aggravating or relieving factors.  As it was persisting, around 9:00 she presented to the emergency room.  She continues to have mild chest discomfort.  She did have pizza and chicken wings last night for dinner.  Past Medical History:  Diagnosis Date  . Anemia   . Arthritis   . Carotid arterial disease ([place])   . Diabetes mellitus   . Hyperlipidemia   . Hypertension   . Migraine   . Obesity   . Reflux   . Sleep apnea     Past Surgical History:  Procedure Laterality Date  . CARDIAC CATHETERIZATION N/A 10/12/2015   Procedure: Left Heart Cath and Coronary Angiography;  Surgeon: Belva Crome, MD;  Location: [facility];  Service: Cardiovascular;  Laterality: N/A;  . CHOLECYSTECTOMY  50 yrs ago  . JOINT REPLACEMENT Right 2005   knee  . WISDOM TOOTH EXTRACTION      Family History  Problem Relation Age of Onset  . Heart attack Father    Social History:  reports that she has never smoked. She has never used smokeless tobacco. She reports that she does not drink alcohol or use drugs.  Allergies:  Allergies  Allergen Reactions  . Amoxicillin Nausea Only    Has  patient had a PCN reaction causing immediate rash, facial/tongue/throat swelling, SOB or lightheadedness with hypotension: yes, i was dizzy Has patient had a PCN reaction causing severe rash involving mucus membranes or skin necrosis: no Did a PCN reaction that required hospitalization : no, called the DR. Did PCN reaction occurring within the last 10 years: unknown- pt cant recall exactly how long ago it was. if all of the above answers are "NO", then may proceed with Cephalosporin use.   [name] [Canagliflozin] Nausea Only and Other (See Comments)    Dizziness    Review of Systems  Constitutional: Negative.   HENT: Negative.   Respiratory: Positive for shortness of breath. Negative for cough, hemoptysis and sputum production.   Cardiovascular: Positive for chest pain. Negative for palpitations, orthopnea, claudication, leg swelling and PND.  Gastrointestinal: Positive for heartburn. Negative for abdominal pain, nausea and vomiting.  Genitourinary: Negative.   Musculoskeletal: Negative.   Skin: Negative.   Neurological: Negative.   Psychiatric/Behavioral: Negative.   All other systems reviewed and are negative.  Height 5\' 2"  (1.575 m), weight (!) 150.6 kg. Body mass index is 60.72 kg/m.  Blood pressure (!) 123/57, pulse 63, resp. rate 16, height 5\' 2"  (1.575 m), weight (!) 150.6 kg, SpO2 99 %.    Physical Exam  Constitutional: She is oriented to person, place, and time. She appears well-developed and well-nourished. No distress.  Morbidly obese  HENT:  Head: Normocephalic  and atraumatic.  Eyes: Conjunctivae are normal.  Neck: Neck supple. No JVD (difficult due to short neck) present.  Cardiovascular: Normal rate, regular rhythm, normal heart sounds and intact distal pulses. Exam reveals no friction rub.  No murmur heard. Pulses:      Carotid pulses are 3+ on the right side, and 3+ on the left side with bruit. Pulmonary/Chest: Effort normal and breath sounds normal.   Abdominal: Soft. Bowel sounds are normal. There is no tenderness. There is no rebound.  Large pannus present  Musculoskeletal: Normal range of motion. She exhibits no edema or tenderness.  Neurological: She is alert and oriented to person, place, and time.  Skin: Skin is warm and dry.  Psychiatric: She has a normal mood and affect.    Results for orders placed or performed during the hospital encounter of 08/06/18 (from the past 48 hour(s))  Basic metabolic panel     Status: Abnormal   Collection Time: 08/06/18  8:51 AM  Result Value Ref Range   Sodium 140 135 - 145 mmol/L   Potassium 3.1 (L) 3.5 - 5.1 mmol/L   Chloride 98 98 - 111 mmol/L   CO2 27 22 - 32 mmol/L   Glucose, Bld 86 70 - 99 mg/dL   BUN 21 8 - 23 mg/dL   Creatinine, Ser 1.62 (H) 0.44 - 1.00 mg/dL   Calcium 9.1 8.9 - 10.3 mg/dL   GFR calc non Af Amer 30 (L) >60 mL/min   GFR calc Af Amer 34 (L) >60 mL/min   Anion gap 15 5 - 15    Comment: Performed at [facility], [address] [place], [place] [address]  CBC     Status: Abnormal   Collection Time: 08/06/18  8:51 AM  Result Value Ref Range   WBC 6.8 4.0 - 10.5 K/uL   RBC 4.34 3.87 - 5.11 MIL/uL   Hemoglobin 11.1 (L) 12.0 - 15.0 g/dL   HCT 36.4 36.0 - 46.0 %   MCV 83.9 80.0 - 100.0 fL   MCH 25.6 (L) 26.0 - 34.0 pg   MCHC 30.5 30.0 - 36.0 g/dL   RDW 14.3 11.5 - 15.5 %   Platelets 238 150 - 400 K/uL   nRBC 0.0 0.0 - 0.2 %    Comment: Performed at [facility], [place] [address] [place], [place] [address]  Magnesium     Status: None   Collection Time: 08/06/18  8:51 AM  Result Value Ref Range   Magnesium 2.1 1.7 - 2.4 mg/dL    Comment: Performed at [facility] [address] [place],  [address]  I-stat troponin, ED     Status: None   Collection Time: 08/06/18  8:56 AM  Result Value Ref Range   Troponin i, poc 0.01 0.00 - 0.08 ng/mL   Comment 3            Comment: Due to the release kinetics of cTnI, a negative result within the  first hours of the onset of symptoms does not rule out myocardial infarction with certainty. If myocardial infarction is still suspected, repeat the test at appropriate intervals.     Labs:   Lab Results  Component Value Date   WBC 6.8 08/06/2018   HGB 11.1 (L) 08/06/2018   HCT 36.4 08/06/2018   MCV 83.9 08/06/2018   PLT 238 08/06/2018   Recent Labs  Lab 08/06/18 0851  NA 140  K 3.1*  CL 98  CO2  27  BUN 21  CREATININE 1.62*  CALCIUM 9.1  GLUCOSE 86    Lipid Panel     Component Value Date/Time   CHOL 90 10/12/2015 0539   TRIG 134 10/12/2015 0539   HDL 28 (L) 10/12/2015 0539   CHOLHDL 3.2 10/12/2015 0539   VLDL 27 10/12/2015 0539   LDLCALC 35 10/12/2015 0539    BNP (last 3 results) Recent Labs    04/01/18 1210  BNP 69.6    HEMOGLOBIN A1C Lab Results  Component Value Date   HGBA1C 6.6 (H) 10/09/2015   MPG 143 10/09/2015   Lab Results  Component Value Date   TROPONINI <0.03 10/09/2015    No current facility-administered medications for this encounter.   Current Outpatient Medications:  .  acetaminophen (TYLENOL) 500 MG tablet, Take 500 mg by mouth daily as needed (muscle soreness)., Disp: , Rfl:  .  albuterol (PROVENTIL HFA;VENTOLIN HFA) 108 (90 Base) MCG/ACT inhaler, Inhale 2 puffs into the lungs every 4 (four) hours as needed for wheezing or shortness of breath. (Patient not taking: Reported on 04/01/2018), Disp: 1 Inhaler, Rfl: 0 .  aspirin EC 81 MG tablet, Take 81 mg by mouth daily.  , Disp: , Rfl:  .  diltiazem (TIAZAC) 120 MG 24 hr capsule, Take 120 mg by mouth daily.  , Disp: , Rfl:  .  enalapril (VASOTEC) 10 MG tablet, Take 1 tablet (10 mg total) by mouth daily. (Patient not taking: Reported on 04/01/2018), Disp: 30 tablet, Rfl: 0 .  ezetimibe (ZETIA) 10 MG tablet, Take 10 mg by mouth every evening.  , Disp: , Rfl:  .  guaiFENesin (MUCINEX) 600 MG 12 hr tablet, Take 2 tablets (1,200 mg total) by mouth 2 (two) times daily. For 5 days (Patient not  taking: Reported on 04/01/2018), Disp: 20 tablet, Rfl: 0 .  hydrochlorothiazide (HYDRODIURIL) 50 MG tablet, Take 50 mg by mouth daily.  , Disp: , Rfl:  .  insulin glargine (LANTUS) 100 UNIT/ML injection, Inject 65 Units into the skin every morning. , Disp: , Rfl:  .  latanoprost (XALATAN) 0.005 % ophthalmic solution, Place 1 drop into the left eye at bedtime., Disp: , Rfl: 5 .  losartan (COZAAR) 25 MG tablet, Take 25 mg by mouth daily., Disp: , Rfl: 11 .  Melatonin 10 MG TABS, Take 10 mg by mouth at bedtime., Disp: , Rfl:  .  Menthol, Topical Analgesic, (BIOFREEZE EX), Apply 1 application topically daily as needed (back pain)., Disp: , Rfl:  .  oxybutynin (DITROPAN-XL) 10 MG 24 hr tablet, Take 10 mg by mouth daily.  , Disp: , Rfl:  .  pantoprazole (PROTONIX) 40 MG tablet, Take 40 mg by mouth daily., Disp: , Rfl:  .  Polyvinyl Alcohol-Povidone (REFRESH OP), Place 1 drop into both eyes at bedtime., Disp: , Rfl:  .  pravastatin (PRAVACHOL) 40 MG tablet, Take 40 mg by mouth daily. , Disp: , Rfl:  .  probenecid (BENEMID) 500 MG tablet, Take 500 mg by mouth 2 (two) times daily., Disp: , Rfl: 3  CARDIAC STUDIES:  EKG 08/06/2018: Normal sinus rhythm with rate of 66 bpm, left axis deviation, incomplete right bundle branch block.  Poor R wave progression, cannot exclude anteroseptal infarct old.  Nonspecific T abnormality.  Compared to 04/01/2018, no significant change.  ECHO done in our office 06/18/2018: Normal LV size, moderate LVH, normal wall motion, grade 2 diastolic dysfunction.  LVEF 57%.  Severely dilated left atrium, mild MR, mild to moderate TR and  mild pulmonary hypertension.  Lexiscan Myoview stress test 05/27/2018, 2-day protocol: Left ventricle is noted to be enlarged both in rest and stress images.  Ejection fraction was 33%.  Soft tissue attenuation artifact in the inferior wall without regional wall motion abnormality.  No reversible ischemia.  High risk study due to low  EF.  Assessment/Plan 1.  Atypical chest pain at most.  No EKG abnormality, in spite of chest pain ongoing for the past 3 hours, serum troponin 2 is -3 hours after the onset of chest pain and again 2 hours later.  She has had a negative nuclear stress test recently, low risk stress test. 2.  GERD.  Patient ate pizza and also chicken wings last night.  Suspect some of his symptoms may be related to GERD.  Do not suspect ACS. 3.  Morbid obesity with obesity hypoventilation and chronic dyspnea on exertion.  She probably also has chronic diastolic heart failure.  Presently on furosemide.  No clinical evidence of congestive heart failure. 4.  Prominent left carotid bruit.  She needs outpatient carotid duplex.  From cardiac standpoint she can be discharged home with outpatient follow-up.  I have discussed noncardiac issues with the patient and her daughter at the bedside.  They also have sublingual nitroglycerin at home and advised him to use this.  For now we could double up on the PPI for the next one week.  Adrian Prows, MD 08/06/2018, 11:28 AM [facility]. [place] Pager: [phone] Office: [phone] If no answer: Cell:  [phone]

## 2018-08-06 NOTE — Progress Notes (Signed)
Patient persist to have mild chest discomfort, unchanged since this morning.  I do not suspect pulmonary embolism, there is no dyspnea, no tachycardia, no hypoxemia.  I do not suspect aortic dissection, no mediastinal enlargement by chest x-ray.  No relief with nitroglycerin, no EKG changes hence ACS is very unlikely although not cannot be completely excluded.    I do not suspect acute peritonitis. Would recommend repeating EKG and also one more set of serum troponin, if negative can be discharged home safely, I'll be happy to see her back in the office in one to 2 days if necessary if she still has persistent chest discomfort.  I have discussed with the ED physicians.   Adrian Prows, MD 08/06/2018, 3:48 PM [facility]. [place] Pager: [phone] Office: [phone] If no answer: Cell:  [phone]

## 2018-08-06 NOTE — ED Provider Notes (Addendum)
[facility]ERGENC[facility] DEPARTMENT Provider Note   CSN: [id number] Arrival date & time: 08/06/18  4696     History   Chief Complaint Chief Complaint  Patient presents with  . Chest Pain  . Shortness of Breath    HPI Jeanne [name] is a 82 y.o. female with PMH HTN, CAD, depression, TIIDM, HLD presenting today with chest tightness and sob that began yesterday. She states it feels as if she cannot catch her breath. She denies arm pain or tingling, jaw pain, or nausea. She denies recent illness, endorses some LE swelling that is normal for her. She also endorses feeling of pulsating in her back.  She states she recently lost her husband in February and has been feeling depressed since then. Denies feelings of wanting to hurt herself or SI. She recently started escitalopram for anxiety and depression.   The history is provided by the patient.    Past Medical History:  Diagnosis Date  . Anemia   . Arthritis   . Carotid arterial disease ([place])   . Diabetes mellitus   . Hyperlipidemia   . Hypertension   . Migraine   . Obesity   . Reflux   . Sleep apnea     Patient Active Problem List   Diagnosis Date Noted  . Unstable angina ([place]) 10/12/2015  . Abnormal myocardial perfusion study 10/12/2015  . NSVT (nonsustained ventricular tachycardia) ([place]) 10/11/2015  . Essential hypertension 10/11/2015  . CKD (chronic kidney disease), stage III ([place]) 10/11/2015  . Pain in the chest   . Chest pain 10/08/2015  . Diabetes mellitus type II, non insulin dependent ([place]) 10/08/2015  . AKI (acute kidney injury) ([place])   . Abscess of left arm 01/01/2013  . Migraine   . Obesity   . Sleep apnea   . Carotid arterial disease ([place])   . Reflux   . Anemia     Past Surgical History:  Procedure Laterality Date  . CARDIAC CATHETERIZATION N/A 10/12/2015   Procedure: Left Heart Cath and Coronary Angiography;  Surgeon: Belva Crome, MD;  Location: [facility];  Service:  Cardiovascular;  Laterality: N/A;  . CHOLECYSTECTOMY  50 yrs ago  . JOINT REPLACEMENT Right 2005   knee  . WISDOM TOOTH EXTRACTION       OB History   None      Home Medications    Prior to Admission medications   Medication Sig Start Date End Date Taking? Authorizing Provider  aspirin EC 81 MG tablet Take 81 mg by mouth daily.     Yes [provider]  diltiazem (TIAZAC) 120 MG 24 hr capsule Take 120 mg by mouth daily.     Yes [provider]  escitalopram (LEXAPRO) 10 MG tablet Take 10 mg by mouth daily. 07/26/18  Yes [provider]  ezetimibe (ZETIA) 10 MG tablet Take 10 mg by mouth every evening.     Yes [provider]  furosemide (LASIX) 40 MG tablet Take 40 mg by mouth daily. 07/01/18  Yes [provider]  hydrochlorothiazide (HYDRODIURIL) 50 MG tablet Take 50 mg by mouth daily.     Yes [provider]  insulin glargine (LANTUS) 100 UNIT/ML injection Inject 65 Units into the skin every morning.    Yes [provider]  latanoprost (XALATAN) 0.005 % ophthalmic solution Place 1 drop into the left eye at bedtime. 09/26/15  Yes [provider]  losartan (COZAAR) 50 MG tablet Take 50 mg by  mouth daily.  03/05/18  Yes [provider]  Melatonin 10 MG TABS Take 10 mg by mouth at bedtime.   Yes [provider]  Menthol, Topical Analgesic, (BIOFREEZE EX) Apply 1 application topically daily as needed (back pain).   Yes [provider]  oxybutynin (DITROPAN-XL) 10 MG 24 hr tablet Take 10 mg by mouth daily.     Yes [provider]  pantoprazole (PROTONIX) 40 MG tablet Take 40 mg by mouth daily. 08/25/15  Yes [provider]  Polyvinyl Alcohol-Povidone (REFRESH OP) Place 1 drop into both eyes at bedtime.   Yes [provider]  pravastatin (PRAVACHOL) 40 MG tablet Take 40 mg by mouth daily.    Yes [provider]  probenecid (BENEMID) 500 MG tablet Take 500 mg by  mouth 2 (two) times daily. 01/10/18  Yes [provider]  albuterol (PROVENTIL HFA;VENTOLIN HFA) 108 (90 Base) MCG/ACT inhaler Inhale 2 puffs into the lungs every 4 (four) hours as needed for wheezing or shortness of breath. Patient not taking: Reported on 08/06/2018 09/11/16   Melony Overly, MD  enalapril (VASOTEC) 10 MG tablet Take 1 tablet (10 mg total) by mouth daily. Patient not taking: Reported on 04/01/2018 10/18/15   Reyne Dumas, MD  guaiFENesin (MUCINEX) 600 MG 12 hr tablet Take 2 tablets (1,200 mg total) by mouth 2 (two) times daily. For 5 days Patient not taking: Reported on 04/01/2018 09/11/16   Melony Overly, MD    Family History Family History  Problem Relation Age of Onset  . Heart attack Father     Social History Social History   Tobacco Use  . Smoking status: Never Smoker  . Smokeless tobacco: Never Used  Substance Use Topics  . Alcohol use: No  . Drug use: No     Allergies   Amoxicillin and Invokana [canagliflozin]   Review of Systems Review of Systems  Constitutional: Negative for appetite change, chills, diaphoresis and fever.  Respiratory: Positive for chest tightness and shortness of breath. Negative for cough and wheezing.   Cardiovascular: Positive for chest pain and leg swelling. Negative for palpitations.  Gastrointestinal: Negative for abdominal distention, abdominal pain, constipation, diarrhea, nausea and vomiting.  Genitourinary: Negative for difficulty urinating, dysuria and hematuria.  Musculoskeletal: Negative for back pain.  Neurological: Positive for headaches. Negative for dizziness, speech difficulty, weakness, light-headedness and numbness.  Psychiatric/Behavioral: Negative for confusion and suicidal ideas. The patient is nervous/anxious.   Ten systems reviewed and are negative for acute change, except as noted in the HPI.     Physical Exam Updated Vital Signs BP (!) 123/57 (BP Location: Right Arm)   Pulse 63   Resp 16   Ht 5'  2" (1.575 m)   Wt (!) 150.6 kg   SpO2 99%   BMI 60.72 kg/m   Physical Exam  Constitutional: She is oriented to person, place, and time. She appears well-nourished. She does not appear ill. No distress.  HENT:  Head: Normocephalic and atraumatic.  Eyes: EOM are normal.  Neck: No JVD present.  Cardiovascular: Normal rate, regular rhythm and normal pulses.  Murmur heard.  Systolic murmur is present with a grade of 2/6. Pulmonary/Chest: Effort normal. She has decreased breath sounds. She has no wheezes. She has no rhonchi. She has no rales.  Abdominal: Soft. Bowel sounds are normal. She exhibits no distension. There is no tenderness. There is no CVA tenderness.  Neurological: She is alert and oriented to person, place, and time.  Skin:  Skin is warm and dry.  Psychiatric: Her mood appears not anxious. She exhibits a depressed mood. She expresses no suicidal ideation. She expresses no suicidal plans.     ED Treatments / Results  Labs (all labs ordered are listed, but only abnormal results are displayed) Labs Reviewed  BASIC METABOLIC PANEL - Abnormal; Notable for the following components:      Result Value   Potassium 3.1 (*)    Creatinine, Ser 1.62 (*)    GFR calc non Af Amer 30 (*)    GFR calc Af Amer 34 (*)    All other components within normal limits  CBC - Abnormal; Notable for the following components:   Hemoglobin 11.1 (*)    MCH 25.6 (*)    All other components within normal limits  MAGNESIUM  I-STAT TROPONIN, ED  I-STAT TROPONIN, ED  I-STAT TROPONIN, ED    EKG EKG Interpretation  Date/Time:  Tuesday August 06 2018 08:30:16 EST Ventricular Rate:  66 PR Interval:  136 QRS Duration: 92 QT Interval:  444 QTC Calculation: 465 R Axis:   -70 Text Interpretation:  Normal sinus rhythm with sinus arrhythmia Left axis deviation Low voltage QRS Cannot rule out Anterior infarct , age undetermined Abnormal ECG No STEMI.  Confirmed by Nanda Quinton [id number]) on 08/06/2018  9:55:20 AM   Radiology Dg Chest 2 View  Result Date: 08/06/2018 CLINICAL DATA:  Pt complains of CP/SOB for a few days, worse this morning. Pain is non radiating; burning in nature. EXAM: CHEST - 2 VIEW COMPARISON:  04/01/2018 FINDINGS: Heart size is UPPER limits normal. The lungs are free of focal consolidations and pleural effusions. There is atherosclerotic calcification of the thoracic aorta. There are large, dense thoracic osteophytes. IMPRESSION: 1.  No evidence for acute  abnormality. 2.  Aortic atherosclerosis.  (ICD10-I70.0) Electronically Signed   By: Nolon Nations M.D.   On: 08/06/2018 09:09    Procedures Procedures (including critical care time)  Medications Ordered in ED Medications  gi cocktail (Maalox,Lidocaine,Donnatal) (has no administration in time range)  nitroGLYCERIN (NITROSTAT) SL tablet 0.4 mg (0.4 mg Sublingual Given 08/06/18 1249)  alum & mag hydroxide-simeth (MAALOX/MYLANTA) 200-200-20 MG/5ML suspension 30 mL (30 mLs Oral Given 08/06/18 1249)    And  lidocaine (XYLOCAINE) 2 % viscous mouth solution 15 mL (15 mLs Oral Given 08/06/18 1249)  fentaNYL (SUBLIMAZE) injection 50 mcg (50 mcg Intravenous Given 08/06/18 1504)     Initial Impression / Assessment and Plan / ED Course  I have reviewed the triage vital signs and the nursing notes.  Pertinent labs & imaging results that were available during my care of the patient were reviewed by me and considered in my medical decision making (see chart for details).  Clinical Course as of Aug 06 1624  Tue Aug 06, 2018  1200 EKG and troponin without acute findings. However, she does have high risk factors and a heart score of 5. Case discussed with Dr. Einar Gip who saw her as well. She was recently seen in his office where echo showed left atrial dilation, grade 2 diastolic dysfunction, and EF of 57%. We will repeat her troponin and treat with GI cocktail and nitroglycerin. If no change in her chest tightness will admit for  observation.    [JS]  1300  Patient continues to have chest pain and tightness after GI cocktail and nitroglycerin although somewhat improved. Repeat troponin remains stable. Dr. Einar Gip recommending fentanyl as pain may be msk vs anxiety as well. If this  does not help, will admit. Will try fentanyl.    [JS]  8786 Continues to feel some chest tightness and SOB when she talks. Discussed with Dr. Einar Gip. This does not seem to be acute ACS. Recommends trending troponin one more time and if normal discharge with close follow-up in two days with cards clinic.    [JS]    Clinical Course User Index [JS] Halena Mohar A, DO    Troponins within normal limits x 2. Patient case discussed and seen by Dr. Einar Gip, her cardiologist. This does not seem to be ACS as EKG, trops, chest x-ray have all been within normal limits. Recent echo done with no acute concerns as well. This may be GERD as she has not been eating as well recently. She states she has been feeling depressed and eating way more than usual. Dr. Einar Gip recommends repeating troponin once more and EKG. If negative, close follow-up in two days with his office and to schedule appointment. Signed out to incoming provider.   Final Clinical Impressions(s) / ED Diagnoses   Final diagnoses:  Precordial chest pain  Atypical chest pain  Angina at rest [facility])    ED Discharge Orders    None       Christianna Belmonte A, DO 08/06/18 1417    Morgane Joerger A, DO 08/06/18 1625    Margette Fast, MD 08/06/18 [phone]

## 2018-08-06 NOTE — ED Notes (Signed)
Pt getting undressed. Refused until dtr got back to room

## 2018-08-06 NOTE — ED Notes (Signed)
Patient verbalizes understanding of discharge instructions. Opportunity for questioning and answers were provided. Armband removed by staff, pt discharged from ED.  

## 2018-08-06 NOTE — ED Triage Notes (Signed)
Pt complains of CP/SOB for a few days. Pain is non radiating; burning in nature. Denies n/v

## 2018-08-06 NOTE — Discharge Instructions (Addendum)
Mrs. Pinkard,  Thank you for allowing [place] to be part of your care. Your labs and imaging have been normal. Please follow-up and call Dr. Irven Shelling office tomorrow to schedule an appointment in the next couple of days.   If you have sudden increased shortness of breath or chest pain please do not hesitate to call EMS or return.

## 2018-10-07 ENCOUNTER — Other Ambulatory Visit: Payer: Self-pay | Admitting: Internal Medicine

## 2018-10-07 DIAGNOSIS — E1342 Other specified diabetes mellitus with diabetic polyneuropathy: Secondary | ICD-10-CM

## 2018-10-07 DIAGNOSIS — M79604 Pain in right leg: Secondary | ICD-10-CM

## 2018-10-07 DIAGNOSIS — M79605 Pain in left leg: Secondary | ICD-10-CM

## 2018-10-07 DIAGNOSIS — I7090 Unspecified atherosclerosis: Secondary | ICD-10-CM

## 2018-10-15 ENCOUNTER — Ambulatory Visit
Admission: RE | Admit: 2018-10-15 | Discharge: 2018-10-15 | Disposition: A | Discharge status: Home / Self Care | Payer: Medicare Other | Source: Ambulatory Visit | Attending: Internal Medicine | Admitting: Internal Medicine

## 2018-10-15 DIAGNOSIS — I7090 Unspecified atherosclerosis: Secondary | ICD-10-CM

## 2018-10-15 DIAGNOSIS — E1342 Other specified diabetes mellitus with diabetic polyneuropathy: Secondary | ICD-10-CM

## 2018-11-14 ENCOUNTER — Other Ambulatory Visit: Payer: Self-pay

## 2018-11-14 ENCOUNTER — Encounter: Payer: Self-pay | Admitting: Podiatry

## 2018-11-14 ENCOUNTER — Ambulatory Visit (INDEPENDENT_AMBULATORY_CARE_PROVIDER_SITE_OTHER): Payer: Medicare Other | Admitting: Podiatry

## 2018-11-14 DIAGNOSIS — Z794 Long term (current) use of insulin: Secondary | ICD-10-CM

## 2018-11-14 DIAGNOSIS — M79674 Pain in right toe(s): Secondary | ICD-10-CM

## 2018-11-14 DIAGNOSIS — M79675 Pain in left toe(s): Secondary | ICD-10-CM

## 2018-11-14 DIAGNOSIS — B351 Tinea unguium: Secondary | ICD-10-CM

## 2018-11-14 DIAGNOSIS — E119 Type 2 diabetes mellitus without complications: Secondary | ICD-10-CM | POA: Diagnosis not present

## 2018-11-14 NOTE — Patient Instructions (Signed)
Diabetes Mellitus and Foot Care Foot care is an important part of your health, especially when you have diabetes. Diabetes may cause you to have problems because of poor blood flow (circulation) to your feet and legs, which can cause your skin to:  Become thinner and drier.  Break more easily.  Heal more slowly.  Peel and crack. You may also have nerve damage (neuropathy) in your legs and feet, causing decreased feeling in them. This means that you may not notice minor injuries to your feet that could lead to more serious problems. Noticing and addressing any potential problems early is the best way to prevent future foot problems. How to care for your feet Foot hygiene  Wash your feet daily with warm water and mild soap. Do not use hot water. Then, pat your feet and the areas between your toes until they are completely dry. Do not soak your feet as this can dry your skin.  Trim your toenails straight across. Do not dig under them or around the cuticle. File the edges of your nails with an emery board or nail file.  Apply a moisturizing lotion or petroleum jelly to the skin on your feet and to dry, brittle toenails. Use lotion that does not contain alcohol and is unscented. Do not apply lotion between your toes. Shoes and socks  Wear clean socks or stockings every day. Make sure they are not too tight. Do not wear knee-high stockings since they may decrease blood flow to your legs.  Wear shoes that fit properly and have enough cushioning. Always look in your shoes before you put them on to be sure there are no objects inside.  To break in new shoes, wear them for just a few hours a day. This prevents injuries on your feet. Wounds, scrapes, corns, and calluses  Check your feet daily for blisters, cuts, bruises, sores, and redness. If you cannot see the bottom of your feet, use a mirror or ask someone for help.  Do not cut corns or calluses or try to remove them with medicine.  If you  find a minor scrape, cut, or break in the skin on your feet, keep it and the skin around it clean and dry. You may clean these areas with mild soap and water. Do not clean the area with peroxide, alcohol, or iodine.  If you have a wound, scrape, corn, or callus on your foot, look at it several times a day to make sure it is healing and not infected. Check for: ? Redness, swelling, or pain. ? Fluid or blood. ? Warmth. ? Pus or a bad smell. General instructions  Do not cross your legs. This may decrease blood flow to your feet.  Do not use heating pads or hot water bottles on your feet. They may burn your skin. If you have lost feeling in your feet or legs, you may not know this is happening until it is too late.  Protect your feet from hot and cold by wearing shoes, such as at the beach or on hot pavement.  Schedule a complete foot exam at least once a year (annually) or more often if you have foot problems. If you have foot problems, report any cuts, sores, or bruises to your health care provider immediately. Contact a health care provider if:  You have a medical condition that increases your risk of infection and you have any cuts, sores, or bruises on your feet.  You have an injury that is not   healing.  You have redness on your legs or feet.  You feel burning or tingling in your legs or feet.  You have pain or cramps in your legs and feet.  Your legs or feet are numb.  Your feet always feel cold.  You have pain around a toenail. Get help right away if:  You have a wound, scrape, corn, or callus on your foot and: ? You have pain, swelling, or redness that gets worse. ? You have fluid or blood coming from the wound, scrape, corn, or callus. ? Your wound, scrape, corn, or callus feels warm to the touch. ? You have pus or a bad smell coming from the wound, scrape, corn, or callus. ? You have a fever. ? You have a red line going up your leg. Summary  Check your feet every day  for cuts, sores, red spots, swelling, and blisters.  Moisturize feet and legs daily.  Wear shoes that fit properly and have enough cushioning.  If you have foot problems, report any cuts, sores, or bruises to your health care provider immediately.  Schedule a complete foot exam at least once a year (annually) or more often if you have foot problems. This information is not intended to replace advice given to you by your health care provider. Make sure you discuss any questions you have with your health care provider. Document Released: 08/18/2000 Document Revised: 10/03/2017 Document Reviewed: 09/22/2016 Elsevier Interactive Patient Education  2019 [employer]  Onychomycosis/Fungal Toenails  WHAT IS IT? An infection that lies within the keratin of your nail plate that is caused by a fungus.  WHY ME? Fungal infections affect all ages, sexes, races, and creeds.  There may be many factors that predispose you to a fungal infection such as age, coexisting medical conditions such as diabetes, or an autoimmune disease; stress, medications, fatigue, genetics, etc.  Bottom line: fungus thrives in a warm, moist environment and your shoes offer such a location.  IS IT CONTAGIOUS? Theoretically, yes.  You do not want to share shoes, nail clippers or files with someone who has fungal toenails.  Walking around barefoot in the same room or sleeping in the same bed is unlikely to transfer the organism.  It is important to realize, however, that fungus can spread easily from one nail to the next on the same foot.  HOW DO WE TREAT THIS?  There are several ways to treat this condition.  Treatment may depend on many factors such as age, medications, pregnancy, liver and kidney conditions, etc.  It is best to ask your doctor which options are available to you.  1. No treatment.   Unlike many other medical concerns, you can live with this condition.  However for many people this can be a painful condition and  may lead to ingrown toenails or a bacterial infection.  It is recommended that you keep the nails cut short to help reduce the amount of fungal nail. 2. Topical treatment.  These range from herbal remedies to prescription strength nail lacquers.  About 40-50% effective, topicals require twice daily application for approximately 9 to 12 months or until an entirely new nail has grown out.  The most effective topicals are medical grade medications available through physicians offices. 3. Oral antifungal medications.  With an 80-90% cure rate, the most common oral medication requires 3 to 4 months of therapy and stays in your system for a year as the new nail grows out.  Oral antifungal medications do require   blood work to make sure it is a safe drug for you.  A liver function panel will be performed prior to starting the medication and after the first month of treatment.  It is important to have the blood work performed to avoid any harmful side effects.  In general, this medication safe but blood work is required. 4. Laser Therapy.  This treatment is performed by applying a specialized laser to the affected nail plate.  This therapy is noninvasive, fast, and non-painful.  It is not covered by insurance and is therefore, out of pocket.  The results have been very good with a 80-95% cure rate.  The [facility] is the only practice in the area to offer this therapy. 5. Permanent Nail Avulsion.  Removing the entire nail so that a new nail will not grow back. 

## 2018-11-23 DIAGNOSIS — I2510 Atherosclerotic heart disease of native coronary artery without angina pectoris: Secondary | ICD-10-CM | POA: Insufficient documentation

## 2018-11-23 DIAGNOSIS — E7800 Pure hypercholesterolemia, unspecified: Secondary | ICD-10-CM | POA: Insufficient documentation

## 2018-11-23 DIAGNOSIS — E785 Hyperlipidemia, unspecified: Secondary | ICD-10-CM | POA: Insufficient documentation

## 2018-11-23 DIAGNOSIS — I25118 Atherosclerotic heart disease of native coronary artery with other forms of angina pectoris: Secondary | ICD-10-CM | POA: Insufficient documentation

## 2018-11-23 NOTE — Progress Notes (Deleted)
Subjective:  Primary Physician:  Seward Carol, MD  Patient ID: Jeanne [name], female    DOB: 08-17-1936, 83 y.o.   MRN: [id number]  No chief complaint on file.   HPI: Jeanne [name]  is a 83 y.o. female, came for  follow-up for Coronary artery disease. Jeanne [name] is 83 years old African-American female. Cardiac cath. on 10/13/2015 by Dr. Mallie Mussel Smith-proximal to mid LAD-50% lesion, ostial first diagonal-70%, ostial second diagonal-75%. Distal circumflex-40%. RCA was diffusely diseased with 30% mid stenosis. She has been treated with medical therapy.  Patient woke up in morning of 08/06/2018 feeling chest tightness and shortness of breath. She went to the emergency room. I have reviewed all the ER records. Her evaluation was unremarkable and troponin I was negative. Chest pain was considered noncardiac, possibly due to GERD. She was discharged home. Since then, she had chest tightness months, relieved by nitroglycerin.  She has c/o shortness of breath, somewhat improved and before. She is able to walk around the house without shortness of breath, but walking outside just for a few yards will make her short of breath. There is no dyspnea at rest and no orthopnea or PND. She has mild swelling around the ankles. No complaints of palpitation, sudden heart racing or irregular heartbeat. No dizziness, near-syncope or syncope. No history of leg claudication.  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 She does not smoke. She has morbid obesity with BMI greater than 55. She also has history of sleep apnea, uses CPAP regularly. Patient used to do water aerobics at [facility] twice a week but, has not been doing recently. She has been walking in the hallways as tolerated.  No history of thyroid problems. No symptoms of TIA. She has history of GERD. ***  Past Medical History:  Diagnosis Date  . Anemia   .  Arthritis   . Carotid arterial disease ([place])   . Diabetes mellitus   . Hyperlipidemia   . Hypertension   . Migraine   . Obesity   . Reflux   . Sleep apnea     Past Surgical History:  Procedure Laterality Date  . CARDIAC CATHETERIZATION N/A 10/12/2015   Procedure: Left Heart Cath and Coronary Angiography;  Surgeon: Belva Crome, MD;  Location: [facility];  Service: Cardiovascular;  Laterality: N/A;  . CHOLECYSTECTOMY  50 yrs ago  . JOINT REPLACEMENT Right 2005   knee  . WISDOM TOOTH EXTRACTION      Social History   Socioeconomic History  . Marital status: Married    Spouse name: Not on file  . Number of children: 3  . Years of education: Not on file  . Highest education level: Not on file  Occupational History  . Not on file  Social Needs  . Financial resource strain: Not on file  . Food insecurity:    Worry: Not on file    Inability: Not on file  . Transportation needs:    Medical: Not on file    Non-medical: Not on file  Tobacco Use  . Smoking status: Never Smoker  . Smokeless tobacco: Never Used  Substance and Sexual Activity  . Alcohol use: No  . Drug use: No  . Sexual activity: Not on file  Lifestyle  . Physical activity:    Days per week: Not on file    Minutes per session: Not on file  . Stress: Not on file  Relationships  .  Social connections:    Talks on phone: Not on file    Gets together: Not on file    Attends religious service: Not on file    Active member of club or organization: Not on file    Attends meetings of clubs or organizations: Not on file    Relationship status: Not on file  . Intimate partner violence:    Fear of current or ex partner: Not on file    Emotionally abused: Not on file    Physically abused: Not on file    Forced sexual activity: Not on file  Other Topics Concern  . Not on file  Social History Narrative  . Not on file    Current Outpatient Medications on File Prior to Visit  Medication Sig Dispense  Refill  . albuterol (PROVENTIL HFA;VENTOLIN HFA) 108 (90 Base) MCG/ACT inhaler Inhale 2 puffs into the lungs every 4 (four) hours as needed for wheezing or shortness of breath. (Patient not taking: Reported on 08/06/2018) 1 Inhaler 0  . aspirin EC 81 MG tablet Take 81 mg by mouth daily.      Marland Kitchen diltiazem (TIAZAC) 120 MG 24 hr capsule Take 120 mg by mouth daily.      . enalapril (VASOTEC) 10 MG tablet Take 1 tablet (10 mg total) by mouth daily. (Patient not taking: Reported on 04/01/2018) 30 tablet 0  . escitalopram (LEXAPRO) 10 MG tablet Take 10 mg by mouth daily.  2  . ezetimibe (ZETIA) 10 MG tablet Take 10 mg by mouth every evening.      . furosemide (LASIX) 40 MG tablet Take 40 mg by mouth daily.  6  . guaiFENesin (MUCINEX) 600 MG 12 hr tablet Take 2 tablets (1,200 mg total) by mouth 2 (two) times daily. For 5 days (Patient not taking: Reported on 04/01/2018) 20 tablet 0  . hydrochlorothiazide (HYDRODIURIL) 50 MG tablet Take 50 mg by mouth daily.      . insulin glargine (LANTUS) 100 UNIT/ML injection Inject 65 Units into the skin every morning.     . latanoprost (XALATAN) 0.005 % ophthalmic solution Place 1 drop into the left eye at bedtime.  5  . losartan (COZAAR) 50 MG tablet Take 50 mg by mouth daily.   11  . Melatonin 10 MG TABS Take 10 mg by mouth at bedtime.    . Menthol, Topical Analgesic, (BIOFREEZE EX) Apply 1 application topically daily as needed (back pain).    Marland Kitchen oxybutynin (DITROPAN-XL) 10 MG 24 hr tablet Take 10 mg by mouth daily.      . pantoprazole (PROTONIX) 40 MG tablet Take 40 mg by mouth daily.    . Polyvinyl Alcohol-Povidone (REFRESH OP) Place 1 drop into both eyes at bedtime.    . pravastatin (PRAVACHOL) 40 MG tablet Take 40 mg by mouth daily.     . probenecid (BENEMID) 500 MG tablet Take 500 mg by mouth 2 (two) times daily.  3   No current facility-administered medications on file prior to visit.     ***Review of Systems  Constitutional: Negative for fever.  HENT:  Negative for nosebleeds.   Eyes: Negative for blurred vision.  Respiratory: Positive for shortness of breath. Negative for cough.   Cardiovascular: Positive for leg swelling (mild swelling around ankles).  Gastrointestinal: Negative for abdominal pain, nausea and vomiting.  Genitourinary: Negative for dysuria.  Musculoskeletal: Negative for myalgias.  Skin: Negative for itching and rash.  Neurological: Negative for dizziness and loss of consciousness.  Psychiatric/Behavioral: The  patient is not nervous/anxious.        Objective:  There were no vitals taken for this visit. There is no height or weight on file to calculate BMI.  ***Physical Exam  Constitutional: She is oriented to person, place, and time. She appears well-developed.  Morbidly obese.  HENT:  Head: Normocephalic and atraumatic.  Eyes: Pupils are equal, round, and reactive to light.  Neck: No thyromegaly present.  Cardiovascular: Normal rate and regular rhythm. Exam reveals no gallop.  Murmur (Gr. 2/6 ESM in aortic & LPSA.) heard. Pulses:      Carotid pulses are 2+ on the right side and 2+ on the left side with bruit.      Dorsalis pedis pulses are 2+ on the right side and 2+ on the left side.       Posterior tibial pulses are 2+ on the right side and 2+ on the left side.  Pulmonary/Chest: She has no wheezes. She has no rales.  Abdominal: She exhibits no mass. There is no abdominal tenderness.  Musculoskeletal:        General: Edema (minimal puffiness around ankles) present.  Lymphadenopathy:    She has no cervical adenopathy.  Neurological: She is alert and oriented to person, place, and time.  Skin: Skin is warm.    CARDIAC STUDIES:  Lexiscan myoview stress test 05/27/2018: 1. Two day lexiscan stress test was performed due to patient's body habitus. . Exercise capacity was not assessed. Stress symptoms included headache, chest pressure, dyspnea and dizziness. Blood pressure was normal. The resting and stress  electrocardiogram demonstrated normal sinus rhythm, LAFB, no resting arrhythmias and normal rest repolarization. Stress EKG is non diagnostic for ischemia as it is a pharmacologic stress. 2. The overall quality of the study is fair, limited due to patient's body habitus. Left ventricular cavity is noted to be enlarged on the rest and stress studies. Gated SPECT images reveal mild global decrease in myocardial thickening and wall motion. The left ventricular ejection fraction was calculated or visually estimated to be 33%. REST and STRESS images demonstrate moderate sized area of mildly decreased tracer uptake in the basal inferior, mid inferior and apical inferior segments of the left ventricle with no significant reversibility. While this could represent tissue attenuation artifact, ischemia/infarct in this region cannot be excluded. Clinical and echocardiographic correlation recommended. 3. High risk study due to low LVEF.   Echocardiogram 06/18/2018: Left ventricle cavity is normal in size. Moderate concentric hypertrophy of the left ventricle. Normal global wall motion. Doppler evidence of grade II (pseudonormal) diastolic dysfunction, elevated LAP. Calculated EF 57%. Left atrial cavity is severely dilated. Mild (Grade I) mitral regurgitation. Mildly restricted mitral valve leaflets. No significant stenosis. Mild to moderate tricuspid regurgitation. Estimated pulmonary artery systolic pressure 35 mmHg. Severe LA dilatation, PH new since previous study in 2015.  Carotid artery duplex 09/09/2018: No significant stenosis right ICA. Stenosis in the left internal carotid artery (50-69%). Antegrade right vertebral artery flow. Antegrade left vertebral artery flow. Follow up in six months is appropriate if clinically indicated.  *** Assessment & Recommendations:   There are no diagnoses linked to this encounter.  Recommendation: *** Patient has occasional chest tightness, may be due to angina.  I have advised her to continue nitroglycerin as needed.  She has diastolic dysfunction of left ventricle by echocardiogram. She also has significant shortness of breath which is multifactorial including morbid obesity and diastolic dysfunction. I have explained to her regarding the Deliver trial with Dapagliflozin. Patient was given  information, she said she wants to think about it and will let us know.  Patient has faint bruit in the left carotid, we will do carotid artery duplex study.  She was advised to continue on the present medications.  Secondary prevention was again discussed. She was advised to follow strict ADA, low-salt, low-cholesterol diet. Patient was also advised strongly to continue calorie restriction to lose more weight. She has morbid obesity, deleterious health effects of morbid obesity were explained to her. She was encouraged to continue regular walking as tolerated.  She will keep her follow-up appointment in February but call us earlier if there are any cardiac problems in the interim.  Despina Hick, MD, [facility] 11/23/2018, 9:50 PM [facility]. [place] Pager: [phone] Office: [phone] If no answer Cell [phone]

## 2018-11-24 NOTE — Progress Notes (Signed)
Subjective: Jeanne [name] presents today referred by Seward Carol, MD with diabetes and cc of painful, discolored, thick toenails which interfere with daily activities.  Pain is aggravated when wearing enclosed shoe gear.  Pt relates feet burning on the bottom of her feet for the past month. She states she had been wearing socks to bed and when she stopped wearing socks, her symptoms resolved.  She does relate history of gout and hasn't had a flare in a while.  She is currently undergoing physical therapy for her left knee.  Past Medical History:  Diagnosis Date  . Anemia   . Arthritis   . Carotid arterial disease ([place])   . Diabetes mellitus   . Hyperlipidemia   . Hypertension   . Migraine   . Obesity   . Reflux   . Sleep apnea     Patient Active Problem List   Diagnosis Date Noted  . Coronary artery disease 11/23/2018  . Hypercholesteremia 11/23/2018  . Unstable angina ([place]) 10/12/2015  . Abnormal myocardial perfusion study 10/12/2015  . NSVT (nonsustained ventricular tachycardia) ([place]) 10/11/2015  . Hypertension with heart disease 10/11/2015  . CKD (chronic kidney disease), stage III ([place]) 10/11/2015  . Pain in the chest   . Chest pain 10/08/2015  . Diabetes mellitus type II, non insulin dependent ([place]) 10/08/2015  . AKI (acute kidney injury) ([place])   . Abscess of left arm 01/01/2013  . Migraine   . Morbid obesity ([place])   . Sleep apnea   . Carotid arterial disease ([place])   . Reflux   . Anemia     Past Surgical History:  Procedure Laterality Date  . CARDIAC CATHETERIZATION N/A 10/12/2015   Procedure: Left Heart Cath and Coronary Angiography;  Surgeon: Belva Crome, MD;  Location: [facility];  Service: Cardiovascular;  Laterality: N/A;  . CHOLECYSTECTOMY  50 yrs ago  . JOINT REPLACEMENT Right 2005   knee  . WISDOM TOOTH EXTRACTION       Current Outpatient Medications:  .  albuterol (PROVENTIL HFA;VENTOLIN HFA) 108 (90 Base) MCG/ACT inhaler, Inhale 2  puffs into the lungs every 4 (four) hours as needed for wheezing or shortness of breath. (Patient not taking: Reported on 08/06/2018), Disp: 1 Inhaler, Rfl: 0 .  aspirin EC 81 MG tablet, Take 81 mg by mouth daily.  , Disp: , Rfl:  .  diltiazem (TIAZAC) 120 MG 24 hr capsule, Take 120 mg by mouth daily.  , Disp: , Rfl:  .  enalapril (VASOTEC) 10 MG tablet, Take 1 tablet (10 mg total) by mouth daily. (Patient not taking: Reported on 04/01/2018), Disp: 30 tablet, Rfl: 0 .  escitalopram (LEXAPRO) 10 MG tablet, Take 10 mg by mouth daily., Disp: , Rfl: 2 .  ezetimibe (ZETIA) 10 MG tablet, Take 10 mg by mouth every evening.  , Disp: , Rfl:  .  furosemide (LASIX) 40 MG tablet, Take 40 mg by mouth daily., Disp: , Rfl: 6 .  guaiFENesin (MUCINEX) 600 MG 12 hr tablet, Take 2 tablets (1,200 mg total) by mouth 2 (two) times daily. For 5 days (Patient not taking: Reported on 04/01/2018), Disp: 20 tablet, Rfl: 0 .  hydrochlorothiazide (HYDRODIURIL) 50 MG tablet, Take 50 mg by mouth daily.  , Disp: , Rfl:  .  insulin glargine (LANTUS) 100 UNIT/ML injection, Inject 65 Units into the skin every morning. , Disp: , Rfl:  .  latanoprost (XALATAN) 0.005 % ophthalmic solution, Place 1 drop into the left eye at  bedtime., Disp: , Rfl: 5 .  losartan (COZAAR) 50 MG tablet, Take 50 mg by mouth daily. , Disp: , Rfl: 11 .  Melatonin 10 MG TABS, Take 10 mg by mouth at bedtime., Disp: , Rfl:  .  Menthol, Topical Analgesic, (BIOFREEZE EX), Apply 1 application topically daily as needed (back pain)., Disp: , Rfl:  .  oxybutynin (DITROPAN-XL) 10 MG 24 hr tablet, Take 10 mg by mouth daily.  , Disp: , Rfl:  .  pantoprazole (PROTONIX) 40 MG tablet, Take 40 mg by mouth daily., Disp: , Rfl:  .  Polyvinyl Alcohol-Povidone (REFRESH OP), Place 1 drop into both eyes at bedtime., Disp: , Rfl:  .  pravastatin (PRAVACHOL) 40 MG tablet, Take 40 mg by mouth daily. , Disp: , Rfl:  .  probenecid (BENEMID) 500 MG tablet, Take 500 mg by mouth 2 (two) times  daily., Disp: , Rfl: 3  Allergies  Allergen Reactions  . Amoxicillin Nausea Only    Has patient had a PCN reaction causing immediate rash, facial/tongue/throat swelling, SOB or lightheadedness with hypotension: yes, i was dizzy Has patient had a PCN reaction causing severe rash involving mucus membranes or skin necrosis: no Did a PCN reaction that required hospitalization : no, called the DR. Did PCN reaction occurring within the last 10 years: unknown- pt cant recall exactly how long ago it was. if all of the above answers are "NO", then may proceed with Cephalosporin use.   [name] [Canagliflozin] Nausea Only and Other (See Comments)    Dizziness     Social History   Occupational History  . Not on file  Tobacco Use  . Smoking status: Never Smoker  . Smokeless tobacco: Never Used  Substance and Sexual Activity  . Alcohol use: No  . Drug use: No  . Sexual activity: Not on file    Family History  Problem Relation Age of Onset  . Heart attack Father   . Heart attack Mother      There is no immunization history on file for this patient.  Review of systems: Positive Findings in bold print.  Constitutional:  chills, fatigue, fever, sweats, weight change Communication: Optometrist, sign Ecologist, hand writing, iPad/Android device Head: headaches, head injury Eyes: changes in vision, eye pain, glaucoma, cataracts, macular degeneration, diplopia, glare,  light sensitivity, eyeglasses or contacts, blindness Ears nose mouth throat: hearing impaired, hearing aids,  ringing in ears, deaf, sign language,  vertigo,   nosebleeds,  rhinitis,  cold sores, snoring, swollen glands Cardiovascular: HTN, edema, arrhythmia, pacemaker in place, defibrillator in place, chest pain/tightness, chronic anticoagulation, blood clot, heart failure, MI Peripheral Vascular: leg cramps, varicose veins, blood clots, lymphedema, varicosities Respiratory:  difficulty breathing, denies  congestion, SOB, wheezing, cough, emphysema Gastrointestinal: change in appetite or weight, abdominal pain, constipation, diarrhea, nausea, vomiting, vomiting blood, change in bowel habits, abdominal pain, jaundice, rectal bleeding, hemorrhoids, GERD Genitourinary:  nocturia,  pain on urination, polyuria,  blood in urine, Foley catheter, urinary urgency, ESRD on hemodialysis Musculoskeletal: amputation, cramping, stiff joints, painful joints, decreased joint motion, fractures, OA, gout, hemiplegia, paraplegia, uses cane, wheelchair bound, uses walker, uses rollator Skin: +changes in toenails, color change, dryness, itching, mole changes,  rash, wound(s) Neurological: headaches, numbness in feet, paresthesias in feet, burning in feet, fainting,  seizures, change in speech. denies headaches, memory problems/poor historian, cerebral palsy, weakness, paralysis, CVA, TIA Endocrine: diabetes, hypothyroidism, hyperthyroidism,  goiter, dry mouth, flushing, heat intolerance,  cold intolerance,  excessive thirst, denies polyuria,  nocturia  Hematological:  easy bleeding, excessive bleeding, easy bruising, enlarged lymph nodes, on long term blood thinner, history of past transusions Allergy/immunological:  hives, eczema, frequent infections, multiple drug allergies, seasonal allergies, transplant recipient, multiple food allergies Psychiatric:  anxiety, depression, mood disorder, suicidal ideations, hallucinations, insomnia  Objective: Vascular Examination: Capillary refill time immediate x 10 digits  Dorsalis pedis pulses palpable b/l.  Posterior tibial pulses palpable b/l.  Digital hair sparse x 10 digits  Skin temperature gradient WNL b/l  Dermatological Examination: Skin with normal turgor, texture and tone b/l.  Toenails 1-5 b/l discolored, thick, dystrophic with subungual debris and pain with palpation to nailbeds due to thickness of nails.  Musculoskeletal: Muscle strength 5/5 to all LE  muscle groups  Neurological: Sensation intact with 10 gram monofilament  Vibratory sensation intact.  Assessment: 1. Painful onychomycosis toenails 1-5 b/l  2. NIDDM with long term insulin use  Plan: 1. Discussed diabetic foot care principles. Literature dispensed on today. 2. Toenails 1-5 b/l were debrided in length and girth without iatrogenic bleeding. 3. Patient to continue soft, supportive shoe gear. 4. Patient to report any pedal injuries to medical professional immediately. 5. Follow up 3 months.  6. Patient/POA to call should there be a concern in the interim.

## 2018-11-25 ENCOUNTER — Ambulatory Visit: Payer: Self-pay | Admitting: Cardiology

## 2018-12-31 ENCOUNTER — Encounter: Payer: Self-pay | Admitting: Cardiology

## 2018-12-31 ENCOUNTER — Ambulatory Visit: Payer: Medicare Other | Admitting: Cardiology

## 2018-12-31 ENCOUNTER — Other Ambulatory Visit: Payer: Self-pay

## 2018-12-31 VITALS — Ht 62.0 in | Wt 336.0 lb

## 2018-12-31 DIAGNOSIS — I2510 Atherosclerotic heart disease of native coronary artery without angina pectoris: Secondary | ICD-10-CM | POA: Diagnosis not present

## 2018-12-31 DIAGNOSIS — I6522 Occlusion and stenosis of left carotid artery: Secondary | ICD-10-CM | POA: Diagnosis not present

## 2018-12-31 DIAGNOSIS — I5189 Other ill-defined heart diseases: Secondary | ICD-10-CM

## 2018-12-31 DIAGNOSIS — E7800 Pure hypercholesterolemia, unspecified: Secondary | ICD-10-CM

## 2018-12-31 DIAGNOSIS — I119 Hypertensive heart disease without heart failure: Secondary | ICD-10-CM | POA: Diagnosis not present

## 2018-12-31 NOTE — Progress Notes (Signed)
Subjective:  Primary Physician:  Seward Carol, MD  Patient ID: Jeanne [name], female    DOB: April 16, 1936, 83 y.o.   MRN: [id number]  This visit type was conducted due to national recommendations for restrictions regarding the COVID-19 Pandemic (e.g. social distancing).  This format is felt to be most appropriate for this patient at this time.  All issues noted in this document were discussed and addressed.  No physical exam was performed (except for noted visual exam findings with Telehealth visits - very limited).  The patient has consented to conduct a Telehealth visit and understands insurance will be billed.   I connected with patient, on 12/31/18  by a telemedicine application and verified that I am speaking with the correct person using two identifiers.     I discussed the limitations of evaluation and management by telemedicine and the availability of in person appointments. The patient expressed understanding and agreed to proceed.   I have discussed with patient regarding the safety during COVID Pandemic and steps and precautions including social distancing with the patient.    Chief Complaint  Patient presents with  . Coronary Artery Disease  . Follow-up    HPI: Jeanne [name]  is a 83 y.o. female  She has CAD. Cardiac cath. on 10/13/2015 by Dr. Mallie Mussel Smith-proximal to mid LAD-50% lesion, ostial first diagonal-70%, ostial second diagonal-75%. Distal circumflex-40%. RCA was diffusely diseased with 30% mid stenosis. She has been treated with medical therapy.  Patient denies any complaints of chest pain, tightness or pressure.  She has c/o shortness of breath, somewhat improved and before. She is able to walk around the house without shortness of breath, She has been walking in the house 200 steps at a time once or twice a day.  Patient has not walked outside recently because of the Covid virus situation. There is no dyspnea at rest and no orthopnea or PND. She has mild  swelling around the ankles. No complaints of palpitation, sudden heart racing or irregular heartbeat. No dizziness, near-syncope or syncope. No history of leg claudication.  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 She does not smoke. She has morbid obesity with BMI greater than 55. She also has history of sleep apnea, uses CPAP regularly. Patient used to do water aerobics at [facility] twice a week but, has not been doing recently.   No history of thyroid problems. No symptoms of TIA. She has history of GERD.   Past Medical History:  Diagnosis Date  . Anemia   . Arthritis   . Carotid arterial disease ([place])   . Diabetes mellitus   . Hyperlipidemia   . Hypertension   . Migraine   . Obesity   . Reflux   . Sleep apnea     Past Surgical History:  Procedure Laterality Date  . CARDIAC CATHETERIZATION N/A 10/12/2015   Procedure: Left Heart Cath and Coronary Angiography;  Surgeon: Belva Crome, MD;  Location: [place] [facility];  Service: Cardiovascular;  Laterality: N/A;  . CHOLECYSTECTOMY  50 yrs ago  . JOINT REPLACEMENT Right 2005   knee  . WISDOM TOOTH EXTRACTION      Social History   Socioeconomic History  . Marital status: Married    Spouse name: Not on file  . Number of children: 3  . Years of education: Not on file  . Highest education level: Not on file  Occupational History  . Not on file  Social Needs  . Financial resource strain: Not on file  . Food insecurity:    Worry: Not on file    Inability: Not on file  . Transportation needs:    Medical: Not on file    Non-medical: Not on file  Tobacco Use  . Smoking status: Never Smoker  . Smokeless tobacco: Never Used  Substance and Sexual Activity  . Alcohol use: No  . Drug use: No  . Sexual activity: Not on file  Lifestyle  . Physical activity:    Days per week: Not on file    Minutes per session: Not on file  . Stress: Not on  file  Relationships  . Social connections:    Talks on phone: Not on file    Gets together: Not on file    Attends religious service: Not on file    Active member of club or organization: Not on file    Attends meetings of clubs or organizations: Not on file    Relationship status: Not on file  . Intimate partner violence:    Fear of current or ex partner: Not on file    Emotionally abused: Not on file    Physically abused: Not on file    Forced sexual activity: Not on file  Other Topics Concern  . Not on file  Social History Narrative  . Not on file    Current Outpatient Medications on File Prior to Visit  Medication Sig Dispense Refill  . aspirin EC 81 MG tablet Take 81 mg by mouth daily.      Marland Kitchen diltiazem (TIAZAC) 120 MG 24 hr capsule Take 120 mg by mouth daily.      Marland Kitchen escitalopram (LEXAPRO) 10 MG tablet Take 10 mg by mouth daily.  2  . ezetimibe (ZETIA) 10 MG tablet Take 10 mg by mouth every evening.      . insulin glargine (LANTUS) 100 UNIT/ML injection Inject 65 Units into the skin every morning.     . latanoprost (XALATAN) 0.005 % ophthalmic solution Place 1 drop into the left eye at bedtime.  5  . losartan (COZAAR) 50 MG tablet Take 100 mg by mouth daily.   11  . Menthol, Topical Analgesic, (BIOFREEZE EX) Apply 1 application topically daily as needed (back pain).    Marland Kitchen oxybutynin (DITROPAN-XL) 10 MG 24 hr tablet Take 10 mg by mouth daily.      . pantoprazole (PROTONIX) 40 MG tablet Take 40 mg by mouth daily.    . Polyvinyl Alcohol-Povidone (REFRESH OP) Place 1 drop into both eyes at bedtime.    . pravastatin (PRAVACHOL) 40 MG tablet Take 40 mg by mouth daily.     . probenecid (BENEMID) 500 MG tablet Take 500 mg by mouth 2 (two) times daily.  3  . albuterol (PROVENTIL HFA;VENTOLIN HFA) 108 (90 Base) MCG/ACT inhaler Inhale 2 puffs into the lungs every 4 (four) hours as needed for wheezing or shortness of breath. (Patient not taking: Reported on 12/31/2018) 1 Inhaler 0  .  furosemide (LASIX) 40 MG tablet Take 40 mg by mouth daily.  6  . guaiFENesin (MUCINEX) 600 MG 12 hr tablet Take 2 tablets (1,200 mg total) by mouth 2 (two) times daily. For 5 days (Patient not taking: Reported on 04/01/2018) 20 tablet 0  . Melatonin 10 MG TABS Take 10 mg by mouth at bedtime.     No current facility-administered medications on file prior to visit.     Review of Systems  Constitutional: Negative for fever.  HENT: Negative for nosebleeds.   Eyes: Negative for blurred vision.  Respiratory: Positive for shortness of breath. Negative for cough.   Cardiovascular: Positive for leg swelling (mild). Negative for chest pain and palpitations.  Gastrointestinal: Negative for abdominal pain, nausea and vomiting.  Genitourinary: Negative for dysuria.  Musculoskeletal: Negative for myalgias.  Skin: Negative for itching and rash.  Neurological: Negative for dizziness, seizures and loss of consciousness.  Psychiatric/Behavioral: The patient is not nervous/anxious.        Objective:  Height 5\' 2"  (1.575 m), weight (!) 336 lb (152.4 kg). Body mass index is 61.46 kg/m.  Physical Exam: Patient is alert and oriented.  She appeared very comfortable talking to me during the visit.  No further physical examination was possible as it was a telemedicine visit.  CARDIAC STUDIES:   Cardiac cath.- 10/13/2015 by Dr. Mallie Mussel Smith-proximal to mid LAD-50% lesion, ostial first diagonal-70%, ostial second diagonal-75%. Distal circumflex-40%. RCA was diffusely diseased with 30% mid stenosis. She has been treated with medical therapy.  Lexiscan myoview stress test 05/27/2018: 1. Two day lexiscan stress test was performed due to patient's body habitus. . Exercise capacity was not assessed. Stress symptoms included headache, chest pressure, dyspnea and dizziness. Blood pressure was normal. The resting and stress electrocardiogram demonstrated normal sinus rhythm, LAFB, no resting arrhythmias and normal rest  repolarization. Stress EKG is non diagnostic for ischemia as it is a pharmacologic stress. 2. The overall quality of the study is fair, limited due to patient's body habitus. Left ventricular cavity is noted to be enlarged on the rest and stress studies. Gated SPECT images reveal mild global decrease in myocardial thickening and wall motion. The left ventricular ejection fraction was calculated or visually estimated to be 33%. REST and STRESS images demonstrate moderate sized area of mildly decreased tracer uptake in the basal inferior, mid inferior and apical inferior segments of the left ventricle with no significant reversibility. While this could represent tissue attenuation artifact, ischemia/infarct in this region cannot be excluded. Clinical and echocardiographic correlation recommended. 3. High risk study due to low LVEF.  Echocardiogram 06/18/2018: Left ventricle cavity is normal in size. Moderate concentric hypertrophy of the left ventricle. Normal global wall motion. Doppler evidence of grade II (pseudonormal) diastolic dysfunction, elevated LAP. Calculated EF 57%. Left atrial cavity is severely dilated. Mild (Grade I) mitral regurgitation. Mildly restricted mitral valve leaflets. No significant stenosis. Mild to moderate tricuspid regurgitation. Estimated pulmonary artery systolic pressure 35 mmHg. Severe LA dilatation, PH new since previous study in 2015  Carotid artery duplex 09/09/2018: No significant stenosis right ICA. Stenosis in the left internal carotid artery (50-69%). Antegrade right vertebral artery flow. Antegrade left vertebral artery flow. Follow up in six months is appropriate if clinically indicated.  ABI- 10/15/2018- right-1.1, left-1.0  Assessment & Recommendations:   Atherosclerosis of native coronary artery of native heart without angina pectoris  Hypertension with heart disease  Diastolic dysfunction  Stenosis of left carotid artery  Morbid obesity ([place])   Hypercholesterolemia  Laboratory Exam:  CBC Latest Ref Rng & Units 08/06/2018 04/01/2018 10/12/2015  WBC 4.0 - 10.5 K/uL 6.8 5.7 5.6  Hemoglobin 12.0 - 15.0 g/dL 11.1(L) 10.6(L) 10.8(L)  Hematocrit 36.0 - 46.0 % 36.4 34.2(L) 34.9(L)  Platelets 150 - 400 K/uL 238 233 219   CMP Latest Ref Rng & Units 08/06/2018 04/01/2018 10/12/2015  Glucose 70 - 99 mg/dL 86 255(H) 169(H)  BUN 8 - 23 mg/dL 21 27(H) 22(H)  Creatinine 0.44 -  1.00 mg/dL 1.62(H) 1.55(H) 1.23(H)  Sodium 135 - 145 mmol/L 140 141 142  Potassium 3.5 - 5.1 mmol/L 3.1(L) 3.7 3.9  Chloride 98 - 111 mmol/L 98 100 104  CO2 22 - 32 mmol/L 27 31 27   Calcium 8.9 - 10.3 mg/dL 9.1 9.2 9.2  Total Protein 6.5 - 8.1 g/dL - 7.2 -  Total Bilirubin 0.3 - 1.2 mg/dL - 0.4 -  Alkaline Phos 38 - 126 U/L - 72 -  AST 15 - 41 U/L - 20 -  ALT 0 - 44 U/L - 13 -   Lipid Panel     Component Value Date/Time   CHOL 90 10/12/2015 0539   TRIG 134 10/12/2015 0539   HDL 28 (L) 10/12/2015 0539   CHOLHDL 3.2 10/12/2015 0539   VLDL 27 10/12/2015 0539   LDLCALC 35 10/12/2015 0539    Recommendation:  Patient remains symptomatically stable.  She has exertional shortness of breath which is predominantly due to morbid obesity and hypertensive heart disease with diastolic dysfunction. She was advised to continue  present medications. Her blood pressure was fairly controlled, patient has not checked her blood pressure in past few months.  Secondary prevention was again discussed. She was advised to follow strict ADA, low-salt, low-cholesterol diet. Patient was also advised strongly to continue calorie restriction to lose more weight. She has morbid obesity, deleterious health effects of morbid obesity were explained to her. She was encouraged to continue regular walking as tolerated.  She will return for follow-up after 4 months but call us earlier.  There are any cardiac problems.  Despina Hick, MD, [facility] 12/31/2018, 5:36 PM [facility]. [place] Pager:  [phone] Office: [phone] If no answer Cell [phone]

## 2019-02-20 ENCOUNTER — Ambulatory Visit: Payer: Medicare Other | Admitting: Podiatry

## 2019-02-25 ENCOUNTER — Other Ambulatory Visit: Payer: Self-pay | Admitting: *Deleted

## 2019-02-25 DIAGNOSIS — Z20822 Contact with and (suspected) exposure to covid-19: Secondary | ICD-10-CM

## 2019-02-28 LAB — NOVEL CORONAVIRUS, NAA: SARS-CoV-2, NAA: NOT DETECTED

## 2019-03-31 ENCOUNTER — Observation Stay (HOSPITAL_COMMUNITY)
Admission: EM | Admit: 2019-03-31 | Discharge: 2019-04-01 | Disposition: A | Discharge status: Home / Self Care | Payer: Medicare Other | Attending: Family Medicine | Admitting: Family Medicine

## 2019-03-31 ENCOUNTER — Emergency Department (HOSPITAL_COMMUNITY): Payer: Medicare Other

## 2019-03-31 ENCOUNTER — Other Ambulatory Visit: Payer: Self-pay

## 2019-03-31 ENCOUNTER — Encounter (HOSPITAL_COMMUNITY): Payer: Self-pay | Admitting: *Deleted

## 2019-03-31 DIAGNOSIS — Z7982 Long term (current) use of aspirin: Secondary | ICD-10-CM | POA: Insufficient documentation

## 2019-03-31 DIAGNOSIS — N179 Acute kidney failure, unspecified: Secondary | ICD-10-CM | POA: Diagnosis present

## 2019-03-31 DIAGNOSIS — R079 Chest pain, unspecified: Principal | ICD-10-CM | POA: Insufficient documentation

## 2019-03-31 DIAGNOSIS — I6522 Occlusion and stenosis of left carotid artery: Secondary | ICD-10-CM | POA: Diagnosis present

## 2019-03-31 DIAGNOSIS — I119 Hypertensive heart disease without heart failure: Secondary | ICD-10-CM

## 2019-03-31 DIAGNOSIS — Z794 Long term (current) use of insulin: Secondary | ICD-10-CM | POA: Diagnosis not present

## 2019-03-31 DIAGNOSIS — I2510 Atherosclerotic heart disease of native coronary artery without angina pectoris: Secondary | ICD-10-CM | POA: Insufficient documentation

## 2019-03-31 DIAGNOSIS — Z9049 Acquired absence of other specified parts of digestive tract: Secondary | ICD-10-CM | POA: Insufficient documentation

## 2019-03-31 DIAGNOSIS — Z20828 Contact with and (suspected) exposure to other viral communicable diseases: Secondary | ICD-10-CM | POA: Diagnosis not present

## 2019-03-31 DIAGNOSIS — R072 Precordial pain: Secondary | ICD-10-CM

## 2019-03-31 DIAGNOSIS — G4733 Obstructive sleep apnea (adult) (pediatric): Secondary | ICD-10-CM | POA: Diagnosis present

## 2019-03-31 DIAGNOSIS — G4730 Sleep apnea, unspecified: Secondary | ICD-10-CM | POA: Diagnosis not present

## 2019-03-31 DIAGNOSIS — E1122 Type 2 diabetes mellitus with diabetic chronic kidney disease: Secondary | ICD-10-CM | POA: Diagnosis not present

## 2019-03-31 DIAGNOSIS — I25118 Atherosclerotic heart disease of native coronary artery with other forms of angina pectoris: Secondary | ICD-10-CM | POA: Diagnosis present

## 2019-03-31 DIAGNOSIS — Z79899 Other long term (current) drug therapy: Secondary | ICD-10-CM | POA: Diagnosis not present

## 2019-03-31 DIAGNOSIS — E119 Type 2 diabetes mellitus without complications: Secondary | ICD-10-CM

## 2019-03-31 DIAGNOSIS — I129 Hypertensive chronic kidney disease with stage 1 through stage 4 chronic kidney disease, or unspecified chronic kidney disease: Secondary | ICD-10-CM | POA: Diagnosis not present

## 2019-03-31 DIAGNOSIS — N184 Chronic kidney disease, stage 4 (severe): Secondary | ICD-10-CM | POA: Insufficient documentation

## 2019-03-31 DIAGNOSIS — Z96651 Presence of right artificial knee joint: Secondary | ICD-10-CM | POA: Diagnosis not present

## 2019-03-31 DIAGNOSIS — I10 Essential (primary) hypertension: Secondary | ICD-10-CM | POA: Diagnosis present

## 2019-03-31 DIAGNOSIS — I209 Angina pectoris, unspecified: Secondary | ICD-10-CM | POA: Diagnosis present

## 2019-03-31 LAB — LIPID PANEL
Cholesterol: 92 mg/dL (ref 0–200)
HDL: 32 mg/dL — ABNORMAL LOW (ref >40)
LDL Cholesterol: 45 mg/dL (ref 0–99)
Total CHOL/HDL Ratio: 2.9 RATIO
Triglycerides: 77 mg/dL (ref <150)
VLDL: 15 mg/dL (ref 0–40)

## 2019-03-31 LAB — GLUCOSE, CAPILLARY: Glucose-Capillary: 95 mg/dL (ref 70–99)

## 2019-03-31 LAB — BASIC METABOLIC PANEL
Anion gap: 12 (ref 5–15)
BUN: 46 mg/dL — ABNORMAL HIGH (ref 8–23)
CO2: 31 mmol/L (ref 22–32)
Calcium: 9.2 mg/dL (ref 8.9–10.3)
Chloride: 94 mmol/L — ABNORMAL LOW (ref 98–111)
Creatinine, Ser: 2.38 mg/dL — ABNORMAL HIGH (ref 0.44–1.00)
GFR calc Af Amer: 21 mL/min — ABNORMAL LOW (ref >60)
GFR calc non Af Amer: 19 mL/min — ABNORMAL LOW (ref >60)
Glucose, Bld: 259 mg/dL — ABNORMAL HIGH (ref 70–99)
Potassium: 3.7 mmol/L (ref 3.5–5.1)
Sodium: 137 mmol/L (ref 135–145)

## 2019-03-31 LAB — CBC
HCT: 34.7 % — ABNORMAL LOW (ref 36.0–46.0)
Hemoglobin: 10.5 g/dL — ABNORMAL LOW (ref 12.0–15.0)
MCH: 25.2 pg — ABNORMAL LOW (ref 26.0–34.0)
MCHC: 30.3 g/dL (ref 30.0–36.0)
MCV: 83.4 fL (ref 80.0–100.0)
Platelets: 255 10*3/uL (ref 150–400)
RBC: 4.16 MIL/uL (ref 3.87–5.11)
RDW: 14.6 % (ref 11.5–15.5)
WBC: 5.9 10*3/uL (ref 4.0–10.5)
nRBC: 0 % (ref 0.0–0.2)

## 2019-03-31 LAB — SARS CORONAVIRUS 2 BY RT PCR (HOSPITAL ORDER, PERFORMED IN ~~LOC~~ HOSPITAL LAB): SARS Coronavirus 2: NEGATIVE

## 2019-03-31 LAB — TROPONIN I (HIGH SENSITIVITY)
Troponin I (High Sensitivity): 16 ng/L (ref <18)
Troponin I (High Sensitivity): 14 ng/L (ref <18)
Troponin I (High Sensitivity): 19 ng/L — ABNORMAL HIGH (ref <18)
Troponin I (High Sensitivity): 17 ng/L (ref <18)

## 2019-03-31 MED ORDER — ENOXAPARIN SODIUM 40 MG/0.4ML ~~LOC~~ SOLN
40.0000 mg | SUBCUTANEOUS | Status: DC
  Administered 2019-03-31: 40 mg via SUBCUTANEOUS
  Filled 2019-03-31: qty 0.4

## 2019-03-31 MED ORDER — LEVOFLOXACIN IN D5W 750 MG/150ML IV SOLN
750.0000 mg | INTRAVENOUS | Status: DC
  Administered 2019-04-01: 750 mg via INTRAVENOUS
  Filled 2019-03-31: qty 150

## 2019-03-31 MED ORDER — INSULIN ASPART 100 UNIT/ML ~~LOC~~ SOLN: 0.0000 [IU] | Freq: Every day | SUBCUTANEOUS | Status: DC

## 2019-03-31 MED ORDER — SODIUM CHLORIDE 0.45 % IV SOLN
INTRAVENOUS | Status: DC
  Administered 2019-03-31: 21:00:00 via INTRAVENOUS

## 2019-03-31 MED ORDER — ASPIRIN 81 MG PO CHEW
324.0000 mg | CHEWABLE_TABLET | Freq: Once | ORAL | Status: AC
  Administered 2019-03-31: 324 mg via ORAL
  Filled 2019-03-31: qty 4

## 2019-03-31 MED ORDER — ONDANSETRON HCL 4 MG/2ML IJ SOLN: 4.0000 mg | Freq: Four times a day (QID) | INTRAMUSCULAR | Status: DC | PRN

## 2019-03-31 MED ORDER — DOXYCYCLINE HYCLATE 100 MG PO TABS
100.0000 mg | ORAL_TABLET | Freq: Once | ORAL | Status: AC
  Administered 2019-03-31: 100 mg via ORAL
  Filled 2019-03-31: qty 1

## 2019-03-31 MED ORDER — ENOXAPARIN SODIUM 80 MG/0.8ML ~~LOC~~ SOLN: 0.5000 mg/kg | SUBCUTANEOUS | Status: DC

## 2019-03-31 MED ORDER — ACETAMINOPHEN 325 MG PO TABS: 650.0000 mg | ORAL_TABLET | ORAL | Status: DC | PRN

## 2019-03-31 MED ORDER — INSULIN ASPART 100 UNIT/ML ~~LOC~~ SOLN
0.0000 [IU] | Freq: Three times a day (TID) | SUBCUTANEOUS | Status: DC
  Administered 2019-04-01 (×2): 1 [IU] via SUBCUTANEOUS

## 2019-03-31 MED ORDER — SODIUM CHLORIDE 0.9% FLUSH
3.0000 mL | Freq: Once | INTRAVENOUS | Status: AC
  Administered 2019-03-31: 3 mL via INTRAVENOUS

## 2019-03-31 MED ORDER — SODIUM CHLORIDE 0.9 % IV SOLN
1.0000 g | Freq: Once | INTRAVENOUS | Status: AC
  Administered 2019-03-31: 1 g via INTRAVENOUS
  Filled 2019-03-31: qty 10

## 2019-03-31 NOTE — ED Notes (Signed)
Report attempted, left call back number.

## 2019-03-31 NOTE — H&P (Signed)
History and Physical   Jeanne [name] ZOX:[id number] DOB: 1936/08/17 DOA: 03/31/2019  Referring MD/NP/PA: Dr. Ralene Bathe  PCP: Seward Carol, MD   Outpatient Specialists: [facility]  Patient coming from: Home  Chief Complaint: Chest pain and shortness of breath  HPI: Jeanne [name] is a 83 y.o. female with medical history significant of diabetes, hypertension, coronary artery disease, morbid obesity, GERD and sleep apnea, hyperlipidemia and osteoarthritis who presented with gradual chest pain for about a week.  Patient also felt some nasal stuffiness but denied any significant cough.  She has some exertional dyspnea.  Patient has taken her home medications without relief.  Her shortness of breath is gradual but intermittent.  Chest pain is centrally located nonradiating.  She had some PND and orthopnea.  No fever no chills no nausea vomiting or diarrhea.  Patient was seen and evaluated.  She has normal initially enzymes and EKG however chest x-ray suggestive of pneumonia.  Patient is being admitted for work-up..  ED Course: Vitals are stable.  White count 5.9 hemoglobin 10.5 and platelets 255.  Fasting lipid panel appears to be within normal.  Chemistry essentially shows glucose 259 creatinine 2.38.  Initial troponin of 16 and 19.  COVID-19 test is pending.  Chest x-ray showed minimal right basilar infiltrate.  Patient will be admitted for treatment of chest pain for cardiac rule out.  Possible pneumonia.  Review of Systems: As per HPI otherwise 10 point review of systems negative.    Past Medical History:  Diagnosis Date  . Anemia   . Arthritis   . Carotid arterial disease ([place])   . Diabetes mellitus   . Hyperlipidemia   . Hypertension   . Migraine   . Obesity   . Reflux   . Sleep apnea     Past Surgical History:  Procedure Laterality Date  . CARDIAC CATHETERIZATION N/A 10/12/2015   Procedure: Left Heart Cath and Coronary Angiography;  Surgeon: Belva Crome, MD;   Location: [place] [facility];  Service: Cardiovascular;  Laterality: N/A;  . CHOLECYSTECTOMY  50 yrs ago  . JOINT REPLACEMENT Right 2005   knee  . WISDOM TOOTH EXTRACTION       reports that she has never smoked. She has never used smokeless tobacco. She reports that she does not drink alcohol or use drugs.  Allergies  Allergen Reactions  . Amoxicillin Nausea Only    Has patient had a PCN reaction causing immediate rash, facial/tongue/throat swelling, SOB or lightheadedness with hypotension: yes, i was dizzy Has patient had a PCN reaction causing severe rash involving mucus membranes or skin necrosis: no Did a PCN reaction that required hospitalization : no, called the DR. Did PCN reaction occurring within the last 10 years: unknown- pt cant recall exactly how long ago it was. if all of the above answers are "NO", then may proceed with Cephalosporin use.   [name] [Canagliflozin] Nausea Only and Other (See Comments)    Dizziness     Family History  Problem Relation Age of Onset  . Heart attack Father   . Heart attack Mother      Prior to Admission medications   Medication Sig Start Date End Date Taking? Authorizing Provider  albuterol (PROVENTIL HFA;VENTOLIN HFA) 108 (90 Base) MCG/ACT inhaler Inhale 2 puffs into the lungs every 4 (four) hours as needed for wheezing or shortness of breath. Patient not taking: Reported on 12/31/2018 09/11/16   Melony Overly, MD  aspirin EC 81 MG tablet Take  81 mg by mouth daily.      [provider]  diltiazem (TIAZAC) 120 MG 24 hr capsule Take 120 mg by mouth daily.      [provider]  escitalopram (LEXAPRO) 10 MG tablet Take 10 mg by mouth daily. 07/26/18   [provider]  ezetimibe (ZETIA) 10 MG tablet Take 10 mg by mouth every evening.      [provider]  furosemide (LASIX) 40 MG tablet Take 40 mg by mouth daily. 07/01/18   [provider]  guaiFENesin (MUCINEX) 600 MG 12 hr tablet Take 2  tablets (1,200 mg total) by mouth 2 (two) times daily. For 5 days Patient not taking: Reported on 04/01/2018 09/11/16   Melony Overly, MD  insulin glargine (LANTUS) 100 UNIT/ML injection Inject 65 Units into the skin every morning.     [provider]  latanoprost (XALATAN) 0.005 % ophthalmic solution Place 1 drop into the left eye at bedtime. 09/26/15   [provider]  losartan (COZAAR) 50 MG tablet Take 100 mg by mouth daily.  03/05/18   [provider]  Melatonin 10 MG TABS Take 10 mg by mouth at bedtime.    [provider]  Menthol, Topical Analgesic, (BIOFREEZE EX) Apply 1 application topically daily as needed (back pain).    [provider]  oxybutynin (DITROPAN-XL) 10 MG 24 hr tablet Take 10 mg by mouth daily.      [provider]  pantoprazole (PROTONIX) 40 MG tablet Take 40 mg by mouth daily. 08/25/15   [provider]  Polyvinyl Alcohol-Povidone (REFRESH OP) Place 1 drop into both eyes at bedtime.    [provider]  pravastatin (PRAVACHOL) 40 MG tablet Take 40 mg by mouth daily.     [provider]  probenecid (BENEMID) 500 MG tablet Take 500 mg by mouth 2 (two) times daily. 01/10/18   [provider]    Physical Exam: Vitals:   03/31/19 1302 03/31/19 1345  BP: 110/77 104/68  Pulse:  72  Resp: 11 10  SpO2:  98%      Constitutional: NAD, calm, Morbidly obese,  Vitals:   03/31/19 1302 03/31/19 1345  BP: 110/77 104/68  Pulse:  72  Resp: 11 10  SpO2:  98%   Eyes: PERRL, lids and conjunctivae normal ENMT: Mucous membranes are moist. Posterior pharynx clear of any exudate or lesions.Normal dentition.  Neck: normal, supple, no masses, no thyromegaly Respiratory: clear to auscultation bilaterally, no wheezing, Basal Crackles, Normal respiratory effort. No accessory muscle use.  Cardiovascular: Regular rate and rhythm, no murmurs / rubs / gallops. No extremity edema. 2+ pedal pulses. No carotid  bruits.  Abdomen: no tenderness, no masses palpated. No hepatosplenomegaly. Bowel sounds positive.  Musculoskeletal: no clubbing / cyanosis. No joint deformity upper and lower extremities. Good ROM, no contractures. Normal muscle tone.  Skin: no rashes, lesions, ulcers. No induration Neurologic: CN 2-12 grossly intact. Sensation intact, DTR normal. Strength 5/5 in all 4.  Psychiatric: Normal judgment and insight. Alert and oriented x 3. Normal mood.     Labs on Admission: I have personally reviewed following labs and imaging studies  CBC: Recent Labs  Lab 03/31/19 1213  WBC 5.9  HGB 10.5*  HCT 34.7*  MCV 83.4  PLT 782   Basic Metabolic Panel: Recent Labs  Lab 03/31/19 1213  NA 137  K 3.7  CL 94*  CO2 31  GLUCOSE 259*  BUN 46*  CREATININE 2.38*  CALCIUM  9.2   GFR: CrCl cannot be calculated (Unknown ideal weight.). Liver Function Tests: No results for input(s): AST, ALT, ALKPHOS, BILITOT, PROT, ALBUMIN in the last 168 hours. No results for input(s): LIPASE, AMYLASE in the last 168 hours. No results for input(s): AMMONIA in the last 168 hours. Coagulation Profile: No results for input(s): INR, PROTIME in the last 168 hours. Cardiac Enzymes: No results for input(s): CKTOTAL, CKMB, CKMBINDEX, TROPONINI in the last 168 hours. BNP (last 3 results) No results for input(s): PROBNP in the last 8760 hours. HbA1C: No results for input(s): HGBA1C in the last 72 hours. CBG: No results for input(s): GLUCAP in the last 168 hours. Lipid Profile: No results for input(s): CHOL, HDL, LDLCALC, TRIG, CHOLHDL, LDLDIRECT in the last 72 hours. Thyroid Function Tests: No results for input(s): TSH, T4TOTAL, FREET4, T3FREE, THYROIDAB in the last 72 hours. Anemia Panel: No results for input(s): VITAMINB12, FOLATE, FERRITIN, TIBC, IRON, RETICCTPCT in the last 72 hours. Urine analysis:    Component Value Date/Time   COLORURINE YELLOW 09/11/2011 1525   APPEARANCEUR HAZY (A) 09/11/2011  1525   LABSPEC 1.008 09/11/2011 1525   PHURINE 6.0 09/11/2011 1525   GLUCOSEU NEGATIVE 09/11/2011 1525   HGBUR TRACE (A) 09/11/2011 1525   BILIRUBINUR NEGATIVE 09/11/2011 1525   KETONESUR NEGATIVE 09/11/2011 1525   PROTEINUR NEGATIVE 09/11/2011 1525   UROBILINOGEN 0.2 09/11/2011 1525   NITRITE POSITIVE (A) 09/11/2011 1525   LEUKOCYTESUR SMALL (A) 09/11/2011 1525   Sepsis Labs: @LABRCNTIP (procalcitonin:4,lacticidven:4) )No results found for this or any previous visit (from the past 240 hour(s)).   Radiological Exams on Admission: Dg Chest 2 View  Result Date: 03/31/2019 CLINICAL DATA:  Chest pain. EXAM: CHEST - 2 VIEW COMPARISON:  08/06/2018. FINDINGS: Heart size stable. Minimal right base infiltrate cannot be excluded. No pleural effusion or pneumothorax. Thoracic spine scoliosis. No acute bony abnormality. IMPRESSION: Minimal right base infiltrate cannot be excluded. Exam otherwise unremarkable. Electronically Signed   By: Marcello Moores  Register   On: 03/31/2019 13:27    EKG: Independently reviewed.  It shows sinus rhythm with a rate of 74.  Low voltage in general.  Abnormal R wave progression.  It appears to be unchanged from previous.  Assessment/Plan Principal Problem:   Chest pain Active Problems:   Morbid obesity ([place])   Sleep apnea   Carotid arterial disease ([facility])   Diabetes mellitus type II, non insulin dependent ([place])   AKI (acute kidney injury) ([place])   Hypertension with heart disease   Coronary artery disease     #1 chest pain: Patient has known history of heart disease.  She may be having cardiac chest pain.  Suggested possible pneumonia which could also be the cause if she is having pleuritic chest pain.  Admit for cardiac rule out.  Empirically treat with antibiotics but if no fever or white count overnight we will stop it.  #2 possible pneumonia: Empirically start Levaquin.  If no fever or chills tomorrow we will stop it.  #3 diabetes: Sliding scale insulin with home  regimen.  #4 acute on chronic kidney disease stage IV: Continue renal monitoring.  #5 morbid obesity: Dietary counseling.   DVT prophylaxis: Lovenox  Code Status: Full  Family Communication: Daughter in the room  Disposition Plan: Home Consults called: None Admission status: Observation  Severity of Illness: The appropriate patient status for this patient is OBSERVATION. Observation status is judged to be reasonable and necessary in order to provide the required intensity of service to ensure the patient's safety.  The patient's presenting symptoms, physical exam findings, and initial radiographic and laboratory data in the context of their medical condition is felt to place them at decreased risk for further clinical deterioration. Furthermore, it is anticipated that the patient will be medically stable for discharge from the hospital within 2 midnights of admission. The following factors support the patient status of observation.   " The patient's presenting symptoms include shortness of breath and chest pain. " The physical exam findings include mild basal crackles. " The initial radiographic and laboratory data are possible pneumonia.     Barbette Merino MD [facility] Pager [phone]  If 7PM-7AM, please contact night-coverage [email or web address] Password [facility]  03/31/2019, 2:35 PM

## 2019-03-31 NOTE — ED Notes (Signed)
Patient transported to X-ray 

## 2019-03-31 NOTE — ED Provider Notes (Signed)
[facility] EMERGENCY DEPARTMENT Provider Note   CSN: [id number] Arrival date & time: 03/31/19  1159    History   Chief Complaint Chief Complaint  Patient presents with  . Chest Pain    HPI Jeanne [name] is a 83 y.o. female.     The history is provided by the patient and medical records. No language interpreter was used.  Chest Pain  Jeanne [name] is a 83 y.o. female who presents to the Emergency Department complaining of chest pain. Pain began about one week ago and is described as a pressure sensation.  It is centrally located, nonradiating.  Pain is worse with exertion.  She has associated DOE, fatigue and decreased exercise tolerance.  She denies any associated N/V/D, fever.  She has chronic BLE edema - unchanged from baseline.  No known coronavirus exposures.  Lives at home with two daughters.    Past Medical History:  Diagnosis Date  . Anemia   . Arthritis   . Carotid arterial disease ([place])   . Diabetes mellitus   . Hyperlipidemia   . Hypertension   . Migraine   . Obesity   . Reflux   . Sleep apnea     Patient Active Problem List   Diagnosis Date Noted  . Coronary artery disease 11/23/2018  . Hypercholesteremia 11/23/2018  . Unstable angina ([place]) 10/12/2015  . Abnormal myocardial perfusion study 10/12/2015  . NSVT (nonsustained ventricular tachycardia) ([place]) 10/11/2015  . Hypertension with heart disease 10/11/2015  . CKD (chronic kidney disease), stage III ([place]) 10/11/2015  . Pain in the chest   . Chest pain 10/08/2015  . Diabetes mellitus type II, non insulin dependent ([place]) 10/08/2015  . AKI (acute kidney injury) ([place])   . Abscess of left arm 01/01/2013  . Migraine   . Morbid obesity ([place])   . Sleep apnea   . Carotid arterial disease ([place])   . Reflux   . Anemia     Past Surgical History:  Procedure Laterality Date  . CARDIAC CATHETERIZATION N/A 10/12/2015   Procedure: Left Heart Cath and Coronary Angiography;  Surgeon:  Belva Crome, MD;  Location: [facility];  Service: Cardiovascular;  Laterality: N/A;  . CHOLECYSTECTOMY  50 yrs ago  . JOINT REPLACEMENT Right 2005   knee  . WISDOM TOOTH EXTRACTION       OB History   No obstetric history on file.      Home Medications    Prior to Admission medications   Medication Sig Start Date End Date Taking? Authorizing Provider  albuterol (PROVENTIL HFA;VENTOLIN HFA) 108 (90 Base) MCG/ACT inhaler Inhale 2 puffs into the lungs every 4 (four) hours as needed for wheezing or shortness of breath. 09/11/16  Yes Melony Overly, MD  aspirin EC 81 MG tablet Take 81 mg by mouth daily.     Yes [provider]  diltiazem (TIAZAC) 120 MG 24 hr capsule Take 120 mg by mouth daily.     Yes [provider]  escitalopram (LEXAPRO) 10 MG tablet Take 10 mg by mouth daily. 07/26/18  Yes [provider]  ezetimibe (ZETIA) 10 MG tablet Take 10 mg by mouth every evening.     Yes [provider]  furosemide (LASIX) 40 MG tablet Take 40 mg by mouth daily. 07/01/18  Yes [provider]  insulin glargine (LANTUS) 100 UNIT/ML injection Inject 65 Units into the skin every morning.    Yes [provider]  latanoprost Ivin Poot)  0.005 % ophthalmic solution Place 1 drop into the left eye at bedtime. 09/26/15  Yes [provider]  losartan (COZAAR) 100 MG tablet Take 100 mg by mouth daily.  03/05/18  Yes [provider]  Menthol, Topical Analgesic, (BIOFREEZE EX) Apply 1 application topically daily as needed (back pain).   Yes [provider]  oxybutynin (DITROPAN-XL) 10 MG 24 hr tablet Take 10 mg by mouth daily.     Yes [provider]  pantoprazole (PROTONIX) 40 MG tablet Take 40 mg by mouth daily. 08/25/15  Yes [provider]  Polyvinyl Alcohol-Povidone (REFRESH OP) Place 1 drop into both eyes at bedtime.   Yes [provider]  pravastatin (PRAVACHOL) 40 MG tablet Take 40 mg by mouth  daily.    Yes [provider]  probenecid (BENEMID) 500 MG tablet Take 500 mg by mouth 2 (two) times daily. 01/10/18  Yes [provider]  guaiFENesin (MUCINEX) 600 MG 12 hr tablet Take 2 tablets (1,200 mg total) by mouth 2 (two) times daily. For 5 days Patient not taking: Reported on 04/01/2018 09/11/16   Melony Overly, MD    Family History Family History  Problem Relation Age of Onset  . Heart attack Father   . Heart attack Mother     Social History Social History   Tobacco Use  . Smoking status: Never Smoker  . Smokeless tobacco: Never Used  Substance Use Topics  . Alcohol use: No  . Drug use: No     Allergies   Amoxicillin and Invokana [canagliflozin]   Review of Systems Review of Systems  Cardiovascular: Positive for chest pain.  All other systems reviewed and are negative.    Physical Exam Updated Vital Signs BP 104/68   Pulse 72   Resp 10   SpO2 98%   Physical Exam Vitals signs and nursing note reviewed.  Constitutional:      Appearance: She is well-developed.  HENT:     Head: Normocephalic and atraumatic.  Cardiovascular:     Rate and Rhythm: Normal rate and regular rhythm.     Heart sounds: No murmur.  Pulmonary:     Effort: Pulmonary effort is normal. No respiratory distress.     Breath sounds: Normal breath sounds.  Abdominal:     Palpations: Abdomen is soft.     Tenderness: There is no abdominal tenderness. There is no guarding or rebound.  Musculoskeletal:        General: No tenderness.     Comments: Nonpitting edema to BLE.  No calf tenderness  Skin:    General: Skin is warm and dry.  Neurological:     Mental Status: She is alert and oriented to person, place, and time.  Psychiatric:        Mood and Affect: Mood normal.        Behavior: Behavior normal.      ED Treatments / Results  Labs (all labs ordered are listed, but only abnormal results are displayed) Labs Reviewed  BASIC METABOLIC PANEL - Abnormal; Notable  for the following components:      Result Value   Chloride 94 (*)    Glucose, Bld 259 (*)    BUN 46 (*)    Creatinine, Ser 2.38 (*)    GFR calc non Af Amer 19 (*)    GFR calc Af Amer 21 (*)    All other components within normal limits  CBC - Abnormal; Notable for the following components:   Hemoglobin  10.5 (*)    HCT 34.7 (*)    MCH 25.2 (*)    All other components within normal limits  SARS CORONAVIRUS 2 (HOSPITAL ORDER, [facility])  LIPID PANEL  TROPONIN I (HIGH SENSITIVITY)  TROPONIN I (HIGH SENSITIVITY)  TROPONIN I (HIGH SENSITIVITY)    EKG EKG Interpretation  Date/Time:  Monday March 31 2019 12:13:38 EDT Ventricular Rate:  83 PR Interval:  162 QRS Duration: 98 QT Interval:  388 QTC Calculation: 455 R Axis:   -61 Text Interpretation:  Sinus rhythm with Premature atrial complexes Low voltage QRS Left anterior fascicular block Cannot rule out Anterior infarct , age undetermined Abnormal ECG Confirmed by Quintella Reichert [phone]) on 03/31/2019 12:19:45 PM Also confirmed by Quintella Reichert [phone]), editor Philomena Doheny [phone])  on 03/31/2019 12:46:10 PM   Radiology Dg Chest 2 View  Result Date: 03/31/2019 CLINICAL DATA:  Chest pain. EXAM: CHEST - 2 VIEW COMPARISON:  08/06/2018. FINDINGS: Heart size stable. Minimal right base infiltrate cannot be excluded. No pleural effusion or pneumothorax. Thoracic spine scoliosis. No acute bony abnormality. IMPRESSION: Minimal right base infiltrate cannot be excluded. Exam otherwise unremarkable. Electronically Signed   By: Marcello Moores  Register   On: 03/31/2019 13:27    Procedures Procedures (including critical care time)  Medications Ordered in ED Medications  cefTRIAXone (ROCEPHIN) 1 g in sodium chloride 0.9 % 100 mL IVPB (1 g Intravenous New Bag/Given 03/31/19 1516)  acetaminophen (TYLENOL) tablet 650 mg (has no administration in time range)  ondansetron (ZOFRAN) injection 4 mg (has no administration in time  range)  enoxaparin (LOVENOX) injection 40 mg (has no administration in time range)  0.45 % sodium chloride infusion (has no administration in time range)  sodium chloride flush (NS) 0.9 % injection 3 mL (3 mLs Intravenous Given 03/31/19 1517)  aspirin chewable tablet 324 mg (324 mg Oral Given 03/31/19 1353)  doxycycline (VIBRA-TABS) tablet 100 mg (100 mg Oral Given 03/31/19 1518)     Initial Impression / Assessment and Plan / ED Course  I have reviewed the triage vital signs and the nursing notes.  Pertinent labs & imaging results that were available during my care of the patient were reviewed by me and considered in my medical decision making (see chart for details).        Patient with history of CKD, coronary artery disease here for evaluation of progressive exertional chest pain. EKG without acute ischemic abnormalities. Initial troponin is negative. Labs demonstrate mild worsening in her CKD. Chest x-ray concerning for possible pneumonia, will treat with antibiotics. In setting of her exertional chest pain recommend observation for further cardiac workup. Discussed with patient and daughter findings of studies recommendation for observation admission and they are in agreement with treatment plan. Hospitalist consulted for admission. Final Clinical Impressions(s) / ED Diagnoses   Final diagnoses:  Nonspecific chest pain    ED Discharge Orders    None       Quintella Reichert, MD 03/31/19 1535

## 2019-03-31 NOTE — ED Triage Notes (Signed)
Pt reports chest pain x 1 week. Pt reports pain is worse on exertion. Pt reports the pain has become duller over the past week but has not gone away. Pt denies SHOB. Pt reports dizziness with standing up.

## 2019-03-31 NOTE — ED Notes (Addendum)
ED TO INPATIENT HANDOFF REPORT  ED Nurse Name and Phone #: Caryl Pina [phone]  E Name/Age/Gender Jeanne [name] 83 y.o. female Room/Bed: 047C/047C  Code Status   Code Status: Full Code  Home/SNF/Other Home Patient oriented to: situation Is this baseline? Yes   Triage Complete: Triage complete  Chief Complaint chest pain  Triage Note Pt reports chest pain x 1 week. Pt reports pain is worse on exertion. Pt reports the pain has become duller over the past week but has not gone away. Pt denies SHOB. Pt reports dizziness with standing up.    Allergies Allergies  Allergen Reactions  . Amoxicillin Nausea Only    Has patient had a PCN reaction causing immediate rash, facial/tongue/throat swelling, SOB or lightheadedness with hypotension: yes, i was dizzy Has patient had a PCN reaction causing severe rash involving mucus membranes or skin necrosis: no Did a PCN reaction that required hospitalization : no, called the DR. Did PCN reaction occurring within the last 10 years: unknown- pt cant recall exactly how long ago it was. if all of the above answers are "NO", then may proceed with Cephalosporin use.   [name] [Canagliflozin] Nausea Only and Other (See Comments)    Dizziness     Level of Care/Admitting Diagnosis ED Disposition    ED Disposition Condition [facility] Area: [place] [[id number]]  Level of Care: Telemetry Cardiac [103]  I expect the patient will be discharged within 24 hours: Yes  LOW acuity---Tx typically complete <24 hrs---ACUTE conditions typically can be evaluated <24 hours---LABS likely to return to acceptable levels <24 hours---IS near functional baseline---EXPECTED to return to current living arrangement---NOT newly hypoxic: Meets criteria for 5C-Observation unit  Covid Evaluation: Asymptomatic Screening Protocol (No Symptoms)  Diagnosis: Chest pain [332951]  Admitting Physician: Elwyn Reach [[id number]]  Attending  Physician: Elwyn Reach [[id number]]  PT Class (Do Not Modify): Observation [104]  PT Acc Code (Do Not Modify): Observation [10022]       B Medical/Surgery History Past Medical History:  Diagnosis Date  . Anemia   . Arthritis   . Carotid arterial disease ([place])   . Diabetes mellitus   . Hyperlipidemia   . Hypertension   . Migraine   . Obesity   . Reflux   . Sleep apnea    Past Surgical History:  Procedure Laterality Date  . CARDIAC CATHETERIZATION N/A 10/12/2015   Procedure: Left Heart Cath and Coronary Angiography;  Surgeon: Belva Crome, MD;  Location: [facility];  Service: Cardiovascular;  Laterality: N/A;  . CHOLECYSTECTOMY  50 yrs ago  . JOINT REPLACEMENT Right 2005   knee  . WISDOM TOOTH EXTRACTION       A IV Location/Drains/Wounds Patient Lines/Drains/Airways Status   Active Line/Drains/Airways    Name:   Placement date:   Placement time:   Site:   Days:   Peripheral IV 03/31/19 Right Antecubital   03/31/19    1516    Antecubital   less than 1          Intake/Output Last 24 hours  Intake/Output Summary (Last 24 hours) at 03/31/2019 1839 Last data filed at 03/31/2019 1546 Gross per 24 hour  Intake 100 ml  Output -  Net 100 ml    Labs/Imaging Results for orders placed or performed during the hospital encounter of 03/31/19 (from the past 48 hour(s))  Basic metabolic panel     Status: Abnormal   Collection Time: 03/31/19 12:13  PM  Result Value Ref Range   Sodium 137 135 - 145 mmol/L   Potassium 3.7 3.5 - 5.1 mmol/L   Chloride 94 (L) 98 - 111 mmol/L   CO2 31 22 - 32 mmol/L   Glucose, Bld 259 (H) 70 - 99 mg/dL   BUN 46 (H) 8 - 23 mg/dL   Creatinine, Ser 2.38 (H) 0.44 - 1.00 mg/dL   Calcium 9.2 8.9 - 10.3 mg/dL   GFR calc non Af Amer 19 (L) >60 mL/min   GFR calc Af Amer 21 (L) >60 mL/min   Anion gap 12 5 - 15    Comment: Performed at [facility] [address], [place], [place] [address]  CBC     Status: Abnormal   Collection Time:  03/31/19 12:13 PM  Result Value Ref Range   WBC 5.9 4.0 - 10.5 K/uL   RBC 4.16 3.87 - 5.11 MIL/uL   Hemoglobin 10.5 (L) 12.0 - 15.0 g/dL   HCT 34.7 (L) 36.0 - 46.0 %   MCV 83.4 80.0 - 100.0 fL   MCH 25.2 (L) 26.0 - 34.0 pg   MCHC 30.3 30.0 - 36.0 g/dL   RDW 14.6 11.5 - 15.5 %   Platelets 255 150 - 400 K/uL   nRBC 0.0 0.0 - 0.2 %    Comment: Performed at [facility], [place] [address], [place], [place] [address]  Troponin I (High Sensitivity)     Status: None   Collection Time: 03/31/19 12:13 PM  Result Value Ref Range   Troponin I (High Sensitivity) 16 <18 ng/L    Comment: (NOTE) Elevated high sensitivity troponin I (hsTnI) values and significant  changes across serial measurements may suggest ACS but many other  chronic and acute conditions are known to elevate hsTnI results.  Refer to the "Links" section for chest pain algorithms and additional  guidance. Performed at [facility], [place] [address], [place], [place] [address]   SARS Coronavirus 2 (CEPHEID - Performed in [facility] hospital lab), Hosp Order     Status: None   Collection Time: 03/31/19  2:29 PM   Specimen: Nasopharyngeal Swab  Result Value Ref Range   SARS Coronavirus 2 NEGATIVE NEGATIVE    Comment: (NOTE) If result is NEGATIVE SARS-CoV-2 target nucleic acids are NOT DETECTED. The SARS-CoV-2 RNA is generally detectable in upper and lower  respiratory specimens during the acute phase of infection. The lowest  concentration of SARS-CoV-2 viral copies this assay can detect is 250  copies / mL. A negative result does not preclude SARS-CoV-2 infection  and should not be used as the sole basis for treatment or other  patient management decisions.  A negative result may occur with  improper specimen collection / handling, submission of specimen other  than nasopharyngeal swab, presence of viral mutation(s) within the  areas targeted by this assay, and inadequate number of viral copies  (<250 copies / mL).  A negative result must be combined with clinical  observations, patient history, and epidemiological information. If result is POSITIVE SARS-CoV-2 target nucleic acids are DETECTED. The SARS-CoV-2 RNA is generally detectable in upper and lower  respiratory specimens dur ing the acute phase of infection.  Positive  results are indicative of active infection with SARS-CoV-2.  Clinical  correlation with patient history and other diagnostic information is  necessary to determine patient infection status.  Positive results do  not rule out bacterial infection or co-infection with other viruses. If result  is PRESUMPTIVE POSTIVE SARS-CoV-2 nucleic acids MAY BE PRESENT.   A presumptive positive result was obtained on the submitted specimen  and confirmed on repeat testing.  While 2019 novel coronavirus  (SARS-CoV-2) nucleic acids may be present in the submitted sample  additional confirmatory testing may be necessary for epidemiological  and / or clinical management purposes  to differentiate between  SARS-CoV-2 and other Sarbecovirus currently known to infect humans.  If clinically indicated additional testing with an alternate test  methodology [phone]) is advised. The SARS-CoV-2 RNA is generally  detectable in upper and lower respiratory sp ecimens during the acute  phase of infection. The expected result is Negative. Fact Sheet for Patients:  StrictlyIdeas.no Fact Sheet for Healthcare Providers: BankingDealers.co.za This test is not yet approved or cleared by the [place] FDA and has been authorized for detection and/or diagnosis of SARS-CoV-2 by FDA under an Emergency Use Authorization (EUA).  This EUA will remain in effect (meaning this test can be used) for the duration of the COVID-19 declaration under Section 564(b)(1) of the Act, 21 U.S.C. section 360bbb-3(b)(1), unless the authorization is terminated or revoked  sooner. Performed at [facility], [place] [address], [place], [place] [address]   Troponin I (High Sensitivity)     Status: Abnormal   Collection Time: 03/31/19  2:43 PM  Result Value Ref Range   Troponin I (High Sensitivity) 19 (H) <18 ng/L    Comment: (NOTE) Elevated high sensitivity troponin I (hsTnI) values and significant  changes across serial measurements may suggest ACS but many other  chronic and acute conditions are known to elevate hsTnI results.  Refer to the "Links" section for chest pain algorithms and additional  guidance. Performed at [facility], [place] [address], [place], [place] [address]   Lipid panel     Status: Abnormal   Collection Time: 03/31/19  2:43 PM  Result Value Ref Range   Cholesterol 92 0 - 200 mg/dL   Triglycerides 77 <150 mg/dL   HDL 32 (L) >40 mg/dL   Total CHOL/HDL Ratio 2.9 RATIO   VLDL 15 0 - 40 mg/dL   LDL Cholesterol 45 0 - 99 mg/dL    Comment:        Total Cholesterol/HDL:CHD Risk Coronary Heart Disease Risk Table                     Men   Women  1/2 Average Risk   3.4   3.3  Average Risk       5.0   4.4  2 X Average Risk   9.6   7.1  3 X Average Risk  23.4   11.0        Use the calculated Patient Ratio above and the CHD Risk Table to determine the patient's CHD Risk.        ATP III CLASSIFICATION (LDL):  <100     mg/dL   Optimal  100-129  mg/dL   Near or Above                    Optimal  130-159  mg/dL   Borderline  160-189  mg/dL   High  >190     mg/dL   Very High Performed at [facility] [address] [place], [place] [address]   Troponin I (High Sensitivity)     Status: None   Collection Time: 03/31/19  3:28 PM  Result Value Ref Range  Troponin I (High Sensitivity) 14 <18 ng/L    Comment: (NOTE) Elevated high sensitivity troponin I (hsTnI) values and significant  changes across serial measurements may suggest ACS but many other  chronic and acute conditions are known to elevate hsTnI results.   Refer to the "Links" section for chest pain algorithms and additional  guidance. Performed at [facility], [place] [address] [place], [place] [address]   Troponin I (High Sensitivity)     Status: None   Collection Time: 03/31/19  5:20 PM  Result Value Ref Range   Troponin I (High Sensitivity) 17 <18 ng/L    Comment: (NOTE) Elevated high sensitivity troponin I (hsTnI) values and significant  changes across serial measurements may suggest ACS but many other  chronic and acute conditions are known to elevate hsTnI results.  Refer to the "Links" section for chest pain algorithms and additional  guidance. Performed at [facility], [place] [address] [place], [place] [address]    Dg Chest 2 View  Result Date: 03/31/2019 CLINICAL DATA:  Chest pain. EXAM: CHEST - 2 VIEW COMPARISON:  08/06/2018. FINDINGS: Heart size stable. Minimal right base infiltrate cannot be excluded. No pleural effusion or pneumothorax. Thoracic spine scoliosis. No acute bony abnormality. IMPRESSION: Minimal right base infiltrate cannot be excluded. Exam otherwise unremarkable. Electronically Signed   By: Marcello Moores  Register   On: 03/31/2019 13:27    Pending Labs Unresulted Labs (From admission, onward)    Start     Ordered   04/07/19 0500  Creatinine, serum  (enoxaparin (LOVENOX)    CrCl >/= 30 ml/min)  Weekly,   R    Comments: while on enoxaparin therapy    03/31/19 1434          Vitals/Pain Today's Vitals   03/31/19 1615 03/31/19 1630 03/31/19 1645 03/31/19 1700  BP: 135/75 131/60 121/73 (!) 113/55  Pulse: 70 72 72 72  Resp: 17 16 11 18   SpO2: 97% 99% 97% 100%  PainSc:        Isolation Precautions No active isolations  Medications Medications  acetaminophen (TYLENOL) tablet 650 mg (has no administration in time range)  ondansetron (ZOFRAN) injection 4 mg (has no administration in time range)  enoxaparin (LOVENOX) injection 40 mg (has no administration in time range)  0.45 % sodium  chloride infusion (has no administration in time range)  insulin aspart (novoLOG) injection 0-9 Units (has no administration in time range)  insulin aspart (novoLOG) injection 0-5 Units (has no administration in time range)  sodium chloride flush (NS) 0.9 % injection 3 mL (3 mLs Intravenous Given 03/31/19 1517)  aspirin chewable tablet 324 mg (324 mg Oral Given 03/31/19 1353)  cefTRIAXone (ROCEPHIN) 1 g in sodium chloride 0.9 % 100 mL IVPB (0 g Intravenous Stopped 03/31/19 1546)  doxycycline (VIBRA-TABS) tablet 100 mg (100 mg Oral Given 03/31/19 1518)    Mobility  Low fall risk   Focused Assessments Cardiac Assessment Handoff:  Cardiac Rhythm: Normal sinus rhythm Lab Results  Component Value Date   TROPONINI <0.03 10/09/2015   No results found for: DDIMER Does the Patient currently have chest pain? Yes     R Recommendations: See Admitting Provider Note  Report given to:   Additional Notes: .

## 2019-03-31 NOTE — ED Notes (Signed)
Out to CT 

## 2019-03-31 NOTE — ED Notes (Addendum)
Attempted to call report at this time.  Receiving RN to call this RN back.

## 2019-03-31 NOTE — Progress Notes (Signed)
Pharmacy Antibiotic Note  Jeanne [name] is a 83 y.o. female admitted on 03/31/2019 with pneumonia.  Pharmacy has been consulted for levofloxacin dosing. Per MD will likely stop tomorrow pending fever curve, CrCl ~26 ml/min.   Plan: Levofloxacin 750mg  IV q48h Follow fever curve   Height: 5\' 2"  (157.5 cm) Weight: (!) 339 lb 14.4 oz (154.2 kg) IBW/kg (Calculated) : 50.1  Temp (24hrs), Avg:98.4 F (36.9 C), Min:98.3 F (36.8 C), Max:98.5 F (36.9 C)  Recent Labs  Lab 03/31/19 1213  WBC 5.9  CREATININE 2.38*    Estimated Creatinine Clearance: 26.8 mL/min (A) (by C-G formula based on SCr of 2.38 mg/dL (H)).    Allergies  Allergen Reactions  . Amoxicillin Nausea Only    Has patient had a PCN reaction causing immediate rash, facial/tongue/throat swelling, SOB or lightheadedness with hypotension: yes, i was dizzy Has patient had a PCN reaction causing severe rash involving mucus membranes or skin necrosis: no Did a PCN reaction that required hospitalization : no, called the DR. Did PCN reaction occurring within the last 10 years: unknown- pt cant recall exactly how long ago it was. if all of the above answers are "NO", then may proceed with Cephalosporin use.   [name] [Canagliflozin] Nausea Only and Other (See Comments)    Dizziness     Antimicrobials this admission: Ceftriaxone 7/27 x1 Doxycycline 7/27 x1 Levofloxacin 7/27 >>    Thank you for allowing pharmacy to be a part of this patient's care.   Arrie Senate, PharmD, BCPS Clinical Pharmacist Please check AMION for all [facility] Pharmacy numbers 03/31/2019

## 2019-04-01 ENCOUNTER — Observation Stay (HOSPITAL_BASED_OUTPATIENT_CLINIC_OR_DEPARTMENT_OTHER): Payer: Medicare Other

## 2019-04-01 DIAGNOSIS — R0602 Shortness of breath: Secondary | ICD-10-CM

## 2019-04-01 DIAGNOSIS — E119 Type 2 diabetes mellitus without complications: Secondary | ICD-10-CM | POA: Diagnosis not present

## 2019-04-01 DIAGNOSIS — N179 Acute kidney failure, unspecified: Secondary | ICD-10-CM

## 2019-04-01 DIAGNOSIS — R079 Chest pain, unspecified: Secondary | ICD-10-CM

## 2019-04-01 DIAGNOSIS — I119 Hypertensive heart disease without heart failure: Secondary | ICD-10-CM | POA: Diagnosis not present

## 2019-04-01 LAB — BASIC METABOLIC PANEL
Anion gap: 14 (ref 5–15)
BUN: 45 mg/dL — ABNORMAL HIGH (ref 8–23)
CO2: 28 mmol/L (ref 22–32)
Calcium: 9.2 mg/dL (ref 8.9–10.3)
Chloride: 94 mmol/L — ABNORMAL LOW (ref 98–111)
Creatinine, Ser: 2.15 mg/dL — ABNORMAL HIGH (ref 0.44–1.00)
GFR calc Af Amer: 24 mL/min — ABNORMAL LOW (ref >60)
GFR calc non Af Amer: 21 mL/min — ABNORMAL LOW (ref >60)
Glucose, Bld: 198 mg/dL — ABNORMAL HIGH (ref 70–99)
Potassium: 3.6 mmol/L (ref 3.5–5.1)
Sodium: 136 mmol/L (ref 135–145)

## 2019-04-01 LAB — ECHOCARDIOGRAM COMPLETE
Height: 62 in
Weight: 5163.2 oz

## 2019-04-01 LAB — GLUCOSE, CAPILLARY
Glucose-Capillary: 145 mg/dL — ABNORMAL HIGH (ref 70–99)
Glucose-Capillary: 145 mg/dL — ABNORMAL HIGH (ref 70–99)

## 2019-04-01 MED ORDER — PERFLUTREN LIPID MICROSPHERE
4.0000 mL | Freq: Once | INTRAVENOUS | Status: AC
  Administered 2019-04-01: 4 mL via INTRAVENOUS

## 2019-04-01 NOTE — Discharge Instructions (Signed)
Jeanne [name],  You were in the hospital because of chest pain. This resolved and your heart workup was unremarkable. Your kidney function is a little worse than in 2019. Please follow-up with your primary care doctor to recheck your lab work and have this followed. In the mean time, I have discontinued your lasix and losartan. With regard to your heart, please follow-up with your heart doctor next week.

## 2019-04-01 NOTE — Care Management (Signed)
1511 04-01-19 Patient to take paper Rx to [facility] on [facility] for evaluation and treatment. Patient has transportation. No further needs from CM at this time. Bethena Roys, RN,BSN Case Manager [phone]

## 2019-04-01 NOTE — Discharge Summary (Signed)
Physician Discharge Summary  Jeanne [name] DGU:[id number] DOB: 1936/02/14 DOA: 03/31/2019  PCP: Seward Carol, MD  Admit date: 03/31/2019 Discharge date: 04/01/2019  Admitted From: Home Disposition: Home  Recommendations for Outpatient Follow-up:  1. Follow up with PCP in 1 week 2. Please obtain BMP/CBC in one week 3. Follow up with cardiologist in 1 week 4. Please follow up on the following pending results: None  Home Health: Outpatient PT Equipment/Devices: None  Discharge Condition: Stable CODE STATUS: Full code Diet recommendation: Heart healthy   Brief/Interim Summary:  Admission HPI written by Elwyn Reach, MD   Chief Complaint: Chest pain and shortness of breath  HPI: Jeanne [name] is a 83 y.o. female with medical history significant of diabetes, hypertension, coronary artery disease, morbid obesity, GERD and sleep apnea, hyperlipidemia and osteoarthritis who presented with gradual chest pain for about a week.  Patient also felt some nasal stuffiness but denied any significant cough.  She has some exertional dyspnea.  Patient has taken her home medications without relief.  Her shortness of breath is gradual but intermittent.  Chest pain is centrally located nonradiating.  She had some PND and orthopnea.  No fever no chills no nausea vomiting or diarrhea.  Patient was seen and evaluated.  She has normal initially enzymes and EKG however chest x-ray suggestive of pneumonia.  Patient is being admitted for work-up..  ED Course: Vitals are stable.  White count 5.9 hemoglobin 10.5 and platelets 255.  Fasting lipid panel appears to be within normal.  Chemistry essentially shows glucose 259 creatinine 2.38.  Initial troponin of 16 and 19.  COVID-19 test is pending.  Chest x-ray showed minimal right basilar infiltrate.  Patient will be admitted for treatment of chest pain for cardiac rule out.  Possible pneumonia.    Hospital course:  Chest pain Atypical.  Resolved. Cardiac workup unremarkable. Troponin trended and with flat trend; not consistent with ACS. Transthoracic Echocardiogram significant for mild hypokinesis which is a change from three years ago. Discussed with primary cardiologist who recommended prompt outpatient follow-up.  Possible pneumonia Unlikely clinically. No cough, fever, leukocytosis. Started on Levaquin empirically which was discontinued prior to discharge  AKI on CKD stage IV Unsure if this is truly AKI. Last labs from 8 months prior. Trending down on discharge. Recommend outpatient follow-up.  Morbid obesity Body mass index is 59.02 kg/m.  Discharge Diagnoses:  Principal Problem:   Chest pain Active Problems:   Morbid obesity ([place])   Sleep apnea   Carotid arterial disease (HCC)   Diabetes mellitus type II, non insulin dependent ([place])   AKI (acute kidney injury) ([place])   Hypertension with heart disease   Coronary artery disease    Discharge Instructions   Allergies as of 04/01/2019      Reactions   Amoxicillin Nausea Only   Has patient had a PCN reaction causing immediate rash, facial/tongue/throat swelling, SOB or lightheadedness with hypotension: yes, i was dizzy Has patient had a PCN reaction causing severe rash involving mucus membranes or skin necrosis: no Did a PCN reaction that required hospitalization : no, called the DR. Did PCN reaction occurring within the last 10 years: unknown- pt cant recall exactly how long ago it was. if all of the above answers are "NO", then may proceed with Cephalosporin use.   Invokana [canagliflozin] Nausea Only, Other (See Comments)   Dizziness      Medication List    STOP taking these medications   furosemide 40 MG tablet  Commonly known as: LASIX   losartan 100 MG tablet Commonly known as: COZAAR     TAKE these medications   albuterol 108 (90 Base) MCG/ACT inhaler Commonly known as: VENTOLIN HFA Inhale 2 puffs into the lungs every 4 (four) hours as  needed for wheezing or shortness of breath.   aspirin EC 81 MG tablet Take 81 mg by mouth daily.   BIOFREEZE EX Apply 1 application topically daily as needed (back pain).   diltiazem 120 MG 24 hr capsule Commonly known as: TIAZAC Take 120 mg by mouth daily.   escitalopram 10 MG tablet Commonly known as: LEXAPRO Take 10 mg by mouth daily.   ezetimibe 10 MG tablet Commonly known as: ZETIA Take 10 mg by mouth every evening.   guaiFENesin 600 MG 12 hr tablet Commonly known as: Mucinex Take 2 tablets (1,200 mg total) by mouth 2 (two) times daily. For 5 days   insulin glargine 100 UNIT/ML injection Commonly known as: LANTUS Inject 65 Units into the skin every morning.   latanoprost 0.005 % ophthalmic solution Commonly known as: XALATAN Place 1 drop into the left eye at bedtime.   oxybutynin 10 MG 24 hr tablet Commonly known as: DITROPAN-XL Take 10 mg by mouth daily.   pantoprazole 40 MG tablet Commonly known as: PROTONIX Take 40 mg by mouth daily.   pravastatin 40 MG tablet Commonly known as: PRAVACHOL Take 40 mg by mouth daily.   probenecid 500 MG tablet Commonly known as: BENEMID Take 500 mg by mouth 2 (two) times daily.   REFRESH OP Place 1 drop into both eyes at bedtime.       Allergies  Allergen Reactions  . Amoxicillin Nausea Only    Has patient had a PCN reaction causing immediate rash, facial/tongue/throat swelling, SOB or lightheadedness with hypotension: yes, i was dizzy Has patient had a PCN reaction causing severe rash involving mucus membranes or skin necrosis: no Did a PCN reaction that required hospitalization : no, called the DR. Did PCN reaction occurring within the last 10 years: unknown- pt cant recall exactly how long ago it was. if all of the above answers are "NO", then may proceed with Cephalosporin use.   [name] [Canagliflozin] Nausea Only and Other (See Comments)    Dizziness     Consultations:  None   Procedures/Studies:  Dg Chest 2 View  Result Date: 03/31/2019 CLINICAL DATA:  Chest pain. EXAM: CHEST - 2 VIEW COMPARISON:  08/06/2018. FINDINGS: Heart size stable. Minimal right base infiltrate cannot be excluded. No pleural effusion or pneumothorax. Thoracic spine scoliosis. No acute bony abnormality. IMPRESSION: Minimal right base infiltrate cannot be excluded. Exam otherwise unremarkable. Electronically Signed   By: Marcello Moores  Register   On: 03/31/2019 13:27    Transthoracic Echocardiogram (04/01/2019) IMPRESSIONS    1. The left ventricle has normal systolic function with an ejection fraction of 60-65%. The cavity size was normal. There is mildly increased left ventricular wall thickness. Left ventricular diastolic Doppler parameters are consistent with impaired  relaxation. Elevated left ventricular end-diastolic pressure The E/e' is >15. No evidence of left ventricular regional wall motion abnormalities.  2. The right ventricle has mildly reduced systolic function. The cavity was moderately enlarged. There is no increase in right ventricular wall thickness.  3. The aortic valve is grossly normal.  4. The aorta is normal in size and structure.   Subjective: No chest pain.  Discharge Exam: Vitals:   04/01/19 0500 04/01/19 0816  BP: 119/64   Pulse:  80   Resp:    Temp: (!) 97.5 F (36.4 C)   SpO2: 99% 98%   Vitals:   04/01/19 0442 04/01/19 0446 04/01/19 0500 04/01/19 0816  BP: (!) 142/67 (!) 142/67 119/64   Pulse: 75 75 80   Resp:      Temp: 97.7 F (36.5 C) 97.7 F (36.5 C) (!) 97.5 F (36.4 C)   TempSrc: Oral  Oral   SpO2: 97%  99% 98%  Weight:  (!) 146.4 kg    Height:        General: Pt is alert, awake, not in acute distress Cardiovascular: RRR, S1/S2 +, no rubs, no gallops Respiratory: CTA bilaterally, no wheezing, no rhonchi Abdominal: Soft, NT, ND, bowel sounds + Extremities: no edema, no cyanosis    The results of significant diagnostics from this hospitalization (including  imaging, microbiology, ancillary and laboratory) are listed below for reference.     Microbiology: Recent Results (from the past 240 hour(s))  SARS Coronavirus 2 (CEPHEID - Performed in [place] hospital lab), Hosp Order     Status: None   Collection Time: 03/31/19  2:29 PM   Specimen: Nasopharyngeal Swab  Result Value Ref Range Status   SARS Coronavirus 2 NEGATIVE NEGATIVE Final    Comment: (NOTE) If result is NEGATIVE SARS-CoV-2 target nucleic acids are NOT DETECTED. The SARS-CoV-2 RNA is generally detectable in upper and lower  respiratory specimens during the acute phase of infection. The lowest  concentration of SARS-CoV-2 viral copies this assay can detect is 250  copies / mL. A negative result does not preclude SARS-CoV-2 infection  and should not be used as the sole basis for treatment or other  patient management decisions.  A negative result may occur with  improper specimen collection / handling, submission of specimen other  than nasopharyngeal swab, presence of viral mutation(s) within the  areas targeted by this assay, and inadequate number of viral copies  (<250 copies / mL). A negative result must be combined with clinical  observations, patient history, and epidemiological information. If result is POSITIVE SARS-CoV-2 target nucleic acids are DETECTED. The SARS-CoV-2 RNA is generally detectable in upper and lower  respiratory specimens dur ing the acute phase of infection.  Positive  results are indicative of active infection with SARS-CoV-2.  Clinical  correlation with patient history and other diagnostic information is  necessary to determine patient infection status.  Positive results do  not rule out bacterial infection or co-infection with other viruses. If result is PRESUMPTIVE POSTIVE SARS-CoV-2 nucleic acids MAY BE PRESENT.   A presumptive positive result was obtained on the submitted specimen  and confirmed on repeat testing.  While 2019 novel  coronavirus  (SARS-CoV-2) nucleic acids may be present in the submitted sample  additional confirmatory testing may be necessary for epidemiological  and / or clinical management purposes  to differentiate between  SARS-CoV-2 and other Sarbecovirus currently known to infect humans.  If clinically indicated additional testing with an alternate test  methodology [phone]) is advised. The SARS-CoV-2 RNA is generally  detectable in upper and lower respiratory sp ecimens during the acute  phase of infection. The expected result is Negative. Fact Sheet for Patients:  StrictlyIdeas.no Fact Sheet for Healthcare Providers: BankingDealers.co.za This test is not yet approved or cleared by the [place] FDA and has been authorized for detection and/or diagnosis of SARS-CoV-2 by FDA under an Emergency Use Authorization (EUA).  This EUA will remain in effect (meaning this test can be used)  for the duration of the COVID-19 declaration under Section 564(b)(1) of the Act, 21 U.S.C. section 360bbb-3(b)(1), unless the authorization is terminated or revoked sooner. Performed at [facility], [place] [address], [place], [place] [address]      Labs: BNP (last 3 results) No results for input(s): BNP in the last 8760 hours. Basic Metabolic Panel: Recent Labs  Lab 03/31/19 1213 04/01/19 0848  NA 137 136  K 3.7 3.6  CL 94* 94*  CO2 31 28  GLUCOSE 259* 198*  BUN 46* 45*  CREATININE 2.38* 2.15*  CALCIUM 9.2 9.2   Liver Function Tests: No results for input(s): AST, ALT, ALKPHOS, BILITOT, PROT, ALBUMIN in the last 168 hours. No results for input(s): LIPASE, AMYLASE in the last 168 hours. No results for input(s): AMMONIA in the last 168 hours. CBC: Recent Labs  Lab 03/31/19 1213  WBC 5.9  HGB 10.5*  HCT 34.7*  MCV 83.4  PLT 255   Cardiac Enzymes: No results for input(s): CKTOTAL, CKMB, CKMBINDEX, TROPONINI in the last 168 hours. BNP:  Invalid input(s): POCBNP CBG: Recent Labs  Lab 03/31/19 2116 04/01/19 0728 04/01/19 1136  GLUCAP 95 145* 145*   D-Dimer No results for input(s): DDIMER in the last 72 hours. Hgb A1c No results for input(s): HGBA1C in the last 72 hours. Lipid Profile Recent Labs    03/31/19 1443  CHOL 92  HDL 32*  LDLCALC 45  TRIG 77  CHOLHDL 2.9   Thyroid function studies No results for input(s): TSH, T4TOTAL, T3FREE, THYROIDAB in the last 72 hours.  Invalid input(s): FREET3 Anemia work up No results for input(s): VITAMINB12, FOLATE, FERRITIN, TIBC, IRON, RETICCTPCT in the last 72 hours. Urinalysis    Component Value Date/Time   COLORURINE YELLOW 09/11/2011 1525   APPEARANCEUR HAZY (A) 09/11/2011 1525   LABSPEC 1.008 09/11/2011 1525   PHURINE 6.0 09/11/2011 1525   GLUCOSEU NEGATIVE 09/11/2011 1525   HGBUR TRACE (A) 09/11/2011 1525   BILIRUBINUR NEGATIVE 09/11/2011 1525   KETONESUR NEGATIVE 09/11/2011 1525   PROTEINUR NEGATIVE 09/11/2011 1525   UROBILINOGEN 0.2 09/11/2011 1525   NITRITE POSITIVE (A) 09/11/2011 1525   LEUKOCYTESUR SMALL (A) 09/11/2011 1525   Sepsis Labs Invalid input(s): PROCALCITONIN,  WBC,  LACTICIDVEN Microbiology Recent Results (from the past 240 hour(s))  SARS Coronavirus 2 (CEPHEID - Performed in [place] hospital lab), Hosp Order     Status: None   Collection Time: 03/31/19  2:29 PM   Specimen: Nasopharyngeal Swab  Result Value Ref Range Status   SARS Coronavirus 2 NEGATIVE NEGATIVE Final    Comment: (NOTE) If result is NEGATIVE SARS-CoV-2 target nucleic acids are NOT DETECTED. The SARS-CoV-2 RNA is generally detectable in upper and lower  respiratory specimens during the acute phase of infection. The lowest  concentration of SARS-CoV-2 viral copies this assay can detect is 250  copies / mL. A negative result does not preclude SARS-CoV-2 infection  and should not be used as the sole basis for treatment or other  patient management decisions.  A  negative result may occur with  improper specimen collection / handling, submission of specimen other  than nasopharyngeal swab, presence of viral mutation(s) within the  areas targeted by this assay, and inadequate number of viral copies  (<250 copies / mL). A negative result must be combined with clinical  observations, patient history, and epidemiological information. If result is POSITIVE SARS-CoV-2 target nucleic acids are DETECTED. The SARS-CoV-2 RNA is generally detectable in upper and lower  respiratory specimens dur ing the acute phase of infection.  Positive  results are indicative of active infection with SARS-CoV-2.  Clinical  correlation with patient history and other diagnostic information is  necessary to determine patient infection status.  Positive results do  not rule out bacterial infection or co-infection with other viruses. If result is PRESUMPTIVE POSTIVE SARS-CoV-2 nucleic acids MAY BE PRESENT.   A presumptive positive result was obtained on the submitted specimen  and confirmed on repeat testing.  While 2019 novel coronavirus  (SARS-CoV-2) nucleic acids may be present in the submitted sample  additional confirmatory testing may be necessary for epidemiological  and / or clinical management purposes  to differentiate between  SARS-CoV-2 and other Sarbecovirus currently known to infect humans.  If clinically indicated additional testing with an alternate test  methodology [phone]) is advised. The SARS-CoV-2 RNA is generally  detectable in upper and lower respiratory sp ecimens during the acute  phase of infection. The expected result is Negative. Fact Sheet for Patients:  StrictlyIdeas.no Fact Sheet for Healthcare Providers: BankingDealers.co.za This test is not yet approved or cleared by the [place] FDA and has been authorized for detection and/or diagnosis of SARS-CoV-2 by FDA under an Emergency Use  Authorization (EUA).  This EUA will remain in effect (meaning this test can be used) for the duration of the COVID-19 declaration under Section 564(b)(1) of the Act, 21 U.S.C. section 360bbb-3(b)(1), unless the authorization is terminated or revoked sooner. Performed at [facility], [place] [address] [place], [place] [address]     SIGNED:   Cordelia Poche, MD [facility] 04/01/2019, 2:06 PM

## 2019-04-01 NOTE — Evaluation (Signed)
Physical Therapy Evaluation and Discharge Patient Details Name: Jeanne [name] MRN: [id number] DOB: 1936/05/07 Today's Date: 04/01/2019   History of Present Illness  83 yo female with onset of PNA and SOB with outdoor walking was admitted, also with chest pain and AKI, referred to PT for home assessment.  Home in all level environment.  PMHx:  migraine, HTN, CAD, DM, OSA, morbid obesity, OA, thoracic scoliosis  Clinical Impression  Pt was seen for monitoring of vitals with mobility, noted LE strength is [facility].  Pulse was up to 121 briefly on walk, from baseline of 90.  Sats were 100% at rest, dropped shortly to 85% on first round of walking and then the final round was able to maintain at 99%.  SPC was used which is baseline.  Will recommend PT outpatient to work on endurance and tolerance for activity of aerobic nature, and will hopefully translate to outdoor walking as she hopes.  Discharge PT for now as pt is at stable level to walk with nursing in hosp if DC is delayed.    Follow Up Recommendations Outpatient PT    Equipment Recommendations  None recommended by PT    Recommendations for Other Services       Precautions / Restrictions Precautions Precautions: Fall Precaution Comments: monitor O2 sats Restrictions Weight Bearing Restrictions: No      Mobility  Bed Mobility Overal bed mobility: Modified Independent             General bed mobility comments: able to maneuver but prefers HOB elevated  Transfers Overall transfer level: Needs assistance Equipment used: Straight cane Transfers: Sit to/from Stand Sit to Stand: Supervision         General transfer comment: uses hands to power up  Ambulation/Gait Ambulation/Gait assistance: Supervision Gait Distance (Feet): 120 Feet(30 x 4) Assistive device: Straight cane(HHA at times on furniture) Gait Pattern/deviations: Step-through pattern;Wide base of support;Drifts right/left Gait velocity: household Gait  velocity interpretation: <1.31 ft/sec, indicative of household ambulator General Gait Details: pt is clearly a bit exerted to do walking but O2 sats were monitored for entire trip series, dropped to 85% on second  trip but after a brief rest maintained at 99% the last two trips.  Stairs            Wheelchair Mobility    Modified Rankin (Stroke Patients Only)       Balance Overall balance assessment: Needs assistance Sitting-balance support: Feet supported Sitting balance-Leahy Scale: Good     Standing balance support: Single extremity supported Standing balance-Leahy Scale: Fair                               Pertinent Vitals/Pain Pain Assessment: No/denies pain    Home Living Family/patient expects to be discharged to:: Private residence Living Arrangements: Children Available Help at Discharge: Family;Available 24 hours/day Type of Home: House Home Access: Level entry     Home Layout: One level Home Equipment: Walker - 2 wheels;Cane - single point Additional Comments: has been doing community walking until admission    Prior Function Level of Independence: Independent with assistive device(s)         Comments: SPC, I with all self care and family to help with cooking and cleaning     Hand Dominance   Dominant Hand: Right    Extremity/Trunk Assessment   Upper Extremity Assessment Upper Extremity Assessment: Overall WFL for tasks assessed    Lower Extremity  Assessment Lower Extremity Assessment: Overall WFL for tasks assessed    Cervical / Trunk Assessment Cervical / Trunk Assessment: Other exceptions(thoracic scoliosis)  Communication   Communication: No difficulties  Cognition Arousal/Alertness: Awake/alert Behavior During Therapy: WFL for tasks assessed/performed Overall Cognitive Status: Within Functional Limits for tasks assessed                                        General Comments General comments (skin  integrity, edema, etc.): pt had a drop in sats with second walk but had been very still in bed prior to this.  Was able to handle the last two with very good readings of 99% with continuous monitoring    Exercises     Assessment/Plan    PT Assessment All further PT needs can be met in the next venue of care  PT Problem List Decreased activity tolerance;Decreased mobility;Cardiopulmonary status limiting activity       PT Treatment Interventions      PT Goals (Current goals can be found in the Care Plan section)  Acute Rehab PT Goals Patient Stated Goal: to feel more energetic with gait PT Goal Formulation: With patient Time For Goal Achievement: 04/08/19 Potential to Achieve Goals: Good    Frequency     Barriers to discharge        Co-evaluation               AM-PAC PT "6 Clicks" Mobility  Outcome Measure Help needed turning from your back to your side while in a flat bed without using bedrails?: None Help needed moving from lying on your back to sitting on the side of a flat bed without using bedrails?: A Little Help needed moving to and from a bed to a chair (including a wheelchair)?: A Little Help needed standing up from a chair using your arms (e.g., wheelchair or bedside chair)?: A Little Help needed to walk in hospital room?: A Little Help needed climbing 3-5 steps with a railing? : A Lot 6 Click Score: 18    End of Session   Activity Tolerance: Patient tolerated treatment well Patient left: in bed;with call bell/phone within reach Nurse Communication: Mobility status;Other (comment)(discharge plan for outpatient therapy) PT Visit Diagnosis: Difficulty in walking, not elsewhere classified (R26.2)    Time: 5364-6803 PT Time Calculation (min) (ACUTE ONLY): 28 min   Charges:   PT Evaluation $PT Eval Moderate Complexity: 1 Mod PT Treatments $Gait Training: 8-22 mins       Ramond Dial 04/01/2019, 10:41 AM   Mee Hives, PT MS Acute Rehab Dept.  Number: [place] [facility] [place]

## 2019-04-03 ENCOUNTER — Encounter: Payer: Self-pay | Admitting: Cardiology

## 2019-04-05 DIAGNOSIS — I519 Heart disease, unspecified: Secondary | ICD-10-CM | POA: Insufficient documentation

## 2019-04-07 ENCOUNTER — Encounter: Payer: Self-pay | Admitting: Cardiology

## 2019-04-07 ENCOUNTER — Other Ambulatory Visit: Payer: Self-pay

## 2019-04-07 ENCOUNTER — Ambulatory Visit: Payer: Medicare Other | Admitting: Cardiology

## 2019-04-07 VITALS — BP 160/76 | HR 71 | Ht 63.0 in | Wt 341.3 lb

## 2019-04-07 DIAGNOSIS — I6522 Occlusion and stenosis of left carotid artery: Secondary | ICD-10-CM | POA: Diagnosis not present

## 2019-04-07 DIAGNOSIS — I2510 Atherosclerotic heart disease of native coronary artery without angina pectoris: Secondary | ICD-10-CM | POA: Diagnosis not present

## 2019-04-07 DIAGNOSIS — I519 Heart disease, unspecified: Secondary | ICD-10-CM

## 2019-04-07 DIAGNOSIS — I119 Hypertensive heart disease without heart failure: Secondary | ICD-10-CM | POA: Diagnosis not present

## 2019-04-07 DIAGNOSIS — E7800 Pure hypercholesterolemia, unspecified: Secondary | ICD-10-CM

## 2019-04-07 MED ORDER — FUROSEMIDE 20 MG PO TABS: 20.0000 mg | ORAL_TABLET | Freq: Every day | ORAL | 3 refills | Status: DC

## 2019-04-07 MED ORDER — DILTIAZEM HCL ER BEADS 240 MG PO CP24: 240.0000 mg | ORAL_CAPSULE | Freq: Every day | ORAL | 0 refills | Status: DC

## 2019-04-07 NOTE — Progress Notes (Addendum)
Subjective:  Primary Physician:  Seward Carol, MD  Patient ID: Jeanne [name], female    DOB: Aug 04, 1936, 83 y.o.   MRN: [id number]   Chief Complaint  Patient presents with   Hospitalization Follow-up   Chest Pain   HPI: Jeanne [name]  is a 83 y.o. female  She has CAD. Cardiac cath. on 10/13/2015 by Dr. Daneen Schick- proximal to mid LAD-50% lesion, ostial first diagonal-70%, ostial second diagonal-75%. Distal circumflex-40%. RCA was diffusely diseased with 30% mid stenosis. She has been treated with medical therapy.  Patient went to the emergency room one week ago for chest pain.  She says that she has been under a lot of stress because of family problems with her daughter.  MI was ruled out and patient was discharged after overnight observation.  She denies any chest pain now.  She has c/o shortness of breath, increased from before. She becomes short of breath on walking in the house.  There is no orthopnea or PND. Patient has not walked outside recently because of the Covid virus situation.She has mild swelling around the ankles. No complaints of palpitation, sudden heart racing or irregular heartbeat. No dizziness, near-syncope or syncope. No history of leg claudication.  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 She does not smoke. She has morbid obesity with BMI greater than 55. She also has history of sleep apnea, uses CPAP regularly.   No history of thyroid problems. No symptoms of TIA. She has history of GERD and CKD.  Past Medical History:  Diagnosis Date   Anemia    Arthritis    Carotid arterial disease ([place])    Diabetes mellitus    Hyperlipidemia    Hypertension    Migraine    Obesity    Reflux    Sleep apnea     Past Surgical History:  Procedure Laterality Date   CARDIAC CATHETERIZATION N/A 10/12/2015   Procedure: Left Heart Cath and Coronary Angiography;   Surgeon: Belva Crome, MD;  Location: [facility];  Service: Cardiovascular;  Laterality: N/A;   CHOLECYSTECTOMY  50 yrs ago   JOINT REPLACEMENT Right 2005   knee   WISDOM TOOTH EXTRACTION      Social History   Socioeconomic History   Marital status: Widowed    Spouse name: Not on file   Number of children: 2   Years of education: Not on file   Highest education level: Not on file  Occupational History   Not on file  Social Needs   Financial resource strain: Not on file   Food insecurity    Worry: Not on file    Inability: Not on file   Transportation needs    Medical: Not on file    Non-medical: Not on file  Tobacco Use   Smoking status: Never Smoker   Smokeless tobacco: Never Used  Substance and Sexual Activity   Alcohol use: Never    Frequency: Never   Drug use: No   Sexual activity: Not on file  Lifestyle   Physical activity    Days per week: Not on file    Minutes per session: Not on file   Stress: Not on file  Relationships   Social connections    Talks on phone: Not on file    Gets together: Not on file    Attends religious service: Not on file    Active member of club or organization: Not  on file    Attends meetings of clubs or organizations: Not on file    Relationship status: Not on file   Intimate partner violence    Fear of current or ex partner: Not on file    Emotionally abused: Not on file    Physically abused: Not on file    Forced sexual activity: Not on file  Other Topics Concern   Not on file  Social History Narrative   Not on file    Current Outpatient Medications on File Prior to Visit  Medication Sig Dispense Refill   albuterol (PROVENTIL HFA;VENTOLIN HFA) 108 (90 Base) MCG/ACT inhaler Inhale 2 puffs into the lungs every 4 (four) hours as needed for wheezing or shortness of breath. 1 Inhaler 0   aspirin EC 81 MG tablet Take 81 mg by mouth daily.       diltiazem (TIAZAC) 120 MG 24 hr capsule Take 120 mg  by mouth daily.       ezetimibe (ZETIA) 10 MG tablet Take 10 mg by mouth every evening.       insulin glargine (LANTUS) 100 UNIT/ML injection Inject 65 Units into the skin every morning.      latanoprost (XALATAN) 0.005 % ophthalmic solution Place 1 drop into the left eye at bedtime.  5   Menthol, Topical Analgesic, (BIOFREEZE EX) Apply 1 application topically daily as needed (back pain).     oxybutynin (DITROPAN-XL) 10 MG 24 hr tablet Take 10 mg by mouth daily.       pantoprazole (PROTONIX) 40 MG tablet Take 40 mg by mouth daily.     Polyvinyl Alcohol-Povidone (REFRESH OP) Place 1 drop into both eyes at bedtime.     pravastatin (PRAVACHOL) 40 MG tablet Take 40 mg by mouth daily.      probenecid (BENEMID) 500 MG tablet Take 500 mg by mouth 2 (two) times daily.  3   No current facility-administered medications on file prior to visit.    Review of Systems  Constitutional: Negative for fever.  HENT: Negative for nosebleeds.   Eyes: Negative for blurred vision.  Respiratory: Positive for shortness of breath. Negative for cough.   Cardiovascular: Positive for leg swelling (minimal). Negative for chest pain and palpitations.  Gastrointestinal: Negative for abdominal pain, nausea and vomiting.  Genitourinary: Negative for dysuria.  Musculoskeletal: Negative for myalgias.  Skin: Negative for itching and rash.  Neurological: Negative for dizziness, seizures and loss of consciousness.  Psychiatric/Behavioral: The patient is not nervous/anxious.       Objective:  Blood pressure (!) 160/76, pulse 71, height 5\' 3"  (1.6 m), weight (!) 341 lb 4.8 oz (154.8 kg), SpO2 96 %. Body mass index is 60.46 kg/m.  Physical Exam  Constitutional: She is oriented to person, place, and time. She appears well-developed.  Morbidly obese.  HENT:  Head: Normocephalic and atraumatic.  Eyes: Conjunctivae are normal.  Neck: No thyromegaly present.  Cardiovascular: Normal rate and regular rhythm. Exam  reveals no gallop.  Murmur (2/6 ESM aortic & LPSA) heard. Pulses:      Carotid pulses are 2+ on the right side and 2+ on the left side with bruit.      Dorsalis pedis pulses are 2+ on the right side and 2+ on the left side.       Posterior tibial pulses are 2+ on the right side and 2+ on the left side.  Pulmonary/Chest: She has no wheezes. She has no rales.  Abdominal: She exhibits no mass. There is no  abdominal tenderness.  Musculoskeletal:        General: Edema (minimal puffiness around ankles) present.  Lymphadenopathy:    She has no cervical adenopathy.  Neurological: She is alert and oriented to person, place, and time.  Skin: Skin is warm.   CARDIAC STUDIES:  Echo- 04/01/2019 1. The left ventricle has normal systolic function with an ejection fraction of 60-65%. The cavity size was normal. There is mildly increased left ventricular wall thickness. Left ventricular diastolic Doppler parameters are consistent with impaired relaxation. Elevated left ventricular end-diastolic pressure The E/e' is >15. No evidence of left ventricular regional wall motion abnormalities.  2. The right ventricle has mildly reduced systolic function. The cavity was moderately enlarged. There is no increase in right ventricular wall thickness.  3. The aortic valve is grossly normal.  4. The aorta is normal in size and structure.  Cardiac cath.- 10/13/2015 by Dr. Mallie Mussel Smith-proximal to mid LAD-50% lesion, ostial first diagonal-70%, ostial second diagonal-75%. Distal circumflex-40%. RCA was diffusely diseased with 30% mid stenosis. She has been treated with medical therapy.  Lexiscan myoview stress test 05/27/2018: 1. Two day lexiscan stress test was performed due to patient's body habitus. . Exercise capacity was not assessed. Stress symptoms included headache, chest pressure, dyspnea and dizziness. Blood pressure was normal. The resting and stress electrocardiogram demonstrated normal sinus rhythm, LAFB, no  resting arrhythmias and normal rest repolarization. Stress EKG is non diagnostic for ischemia as it is a pharmacologic stress. 2. The overall quality of the study is fair, limited due to patient's body habitus. Left ventricular cavity is noted to be enlarged on the rest and stress studies. Gated SPECT images reveal mild global decrease in myocardial thickening and wall motion. The left ventricular ejection fraction was calculated or visually estimated to be 33%. REST and STRESS images demonstrate moderate sized area of mildly decreased tracer uptake in the basal inferior, mid inferior and apical inferior segments of the left ventricle with no significant reversibility. While this could represent tissue attenuation artifact, ischemia/infarct in this region cannot be excluded. Clinical and echocardiographic correlation recommended. 3. High risk study due to low LVEF.  Carotid artery duplex 09/09/2018: No significant stenosis right ICA. Stenosis in the left internal carotid artery (50-69%). Antegrade right vertebral artery flow. Antegrade left vertebral artery flow. Follow up in six months is appropriate if clinically indicated.  ABI- 10/15/2018- right-1.1, left-1.0  Assessment & Recommendations:   Atherosclerosis of native coronary artery of native heart without angina pectoris   Hypertension with heart disease   Diastolic dysfunction, left ventricle - Plan: furosemide (LASIX) 20 MG tablet.  Stenosis of left carotid artery   Hypercholesteremia   Morbid obesity ([place])   Laboratory Exam:  CBC Latest Ref Rng & Units 03/31/2019 08/06/2018 04/01/2018  WBC 4.0 - 10.5 K/uL 5.9 6.8 5.7  Hemoglobin 12.0 - 15.0 g/dL 10.5(L) 11.1(L) 10.6(L)  Hematocrit 36.0 - 46.0 % 34.7(L) 36.4 34.2(L)  Platelets 150 - 400 K/uL 255 238 233   CMP Latest Ref Rng & Units 04/01/2019 03/31/2019 08/06/2018  Glucose 70 - 99 mg/dL 198(H) 259(H) 86  BUN 8 - 23 mg/dL 45(H) 46(H) 21  Creatinine 0.44 - 1.00 mg/dL 2.15(H)  2.38(H) 1.62(H)  Sodium 135 - 145 mmol/L 136 137 140  Potassium 3.5 - 5.1 mmol/L 3.6 3.7 3.1(L)  Chloride 98 - 111 mmol/L 94(L) 94(L) 98  CO2 22 - 32 mmol/L 28 31 27   Calcium 8.9 - 10.3 mg/dL 9.2 9.2 9.1  Total Protein 6.5 - 8.1 g/dL - - -  Total Bilirubin 0.3 - 1.2 mg/dL - - -  Alkaline Phos 38 - 126 U/L - - -  AST 15 - 41 U/L - - -  ALT 0 - 44 U/L - - -   Lipid Panel     Component Value Date/Time   CHOL 92 03/31/2019 1443   TRIG 77 03/31/2019 1443   HDL 32 (L) 03/31/2019 1443   CHOLHDL 2.9 03/31/2019 1443   VLDL 15 03/31/2019 1443   LDLCALC 45 03/31/2019 1443    Recommendation:  She has increased shortness of breath which is predominantly due to morbid obesity and hypertensive heart disease with diastolic dysfunction. Her systolic blood pressure is high today. She was advised to increase diltiazem ER to 240 mg daily. I have added furosemide 20 mg daily. She was advised to continue  present medications.  Secondary prevention was again discussed. She was advised to follow strict ADA, low-salt, low-cholesterol diet. Patient was also advised strongly to continue calorie restriction to lose weight. She has morbid obesity, deleterious health effects of morbid obesity were explained to her. Patient was given dietary instructions and was given diet sheet.  She will keep the follow-up appointment on 24th of August. Call us if there are any cardiac problems in the interim. Total time spent with patient was 30     minutes and greater than 50% of that time was spent in counseling and coordination of care with the patient. I had reviewed hospital records. Patient was explained about her condition, management, possible side effects of medicines and dietary instructions etc.                                 Despina Hick, MD, [facility] 04/07/2019, 12:14 PM [facility]. [place] Pager: [phone] Office: [phone] If no answer Cell [phone]

## 2019-04-11 ENCOUNTER — Other Ambulatory Visit: Payer: Self-pay | Admitting: Internal Medicine

## 2019-04-11 DIAGNOSIS — N179 Acute kidney failure, unspecified: Secondary | ICD-10-CM

## 2019-04-14 ENCOUNTER — Ambulatory Visit
Admission: RE | Admit: 2019-04-14 | Discharge: 2019-04-14 | Disposition: A | Discharge status: Home / Self Care | Payer: Medicare Other | Source: Ambulatory Visit | Attending: Internal Medicine | Admitting: Internal Medicine

## 2019-04-14 DIAGNOSIS — N179 Acute kidney failure, unspecified: Secondary | ICD-10-CM

## 2019-04-17 ENCOUNTER — Telehealth: Payer: Self-pay

## 2019-04-17 NOTE — Telephone Encounter (Signed)
Pt aware.//ah

## 2019-04-17 NOTE — Telephone Encounter (Signed)
Pt called stating that the hospital is recommending PT for her heart and wants you to refer her to a cardiac rehab. Please advise.//ah

## 2019-04-21 NOTE — Telephone Encounter (Signed)
Patient called this morning and said she does not want to wait until she comes in on 04/28/19 to be referred to Cardiac Rehab. She wanted me to send you a message and ask you to do it asap. Please advise

## 2019-04-23 ENCOUNTER — Telehealth (HOSPITAL_COMMUNITY): Payer: Self-pay | Admitting: *Deleted

## 2019-04-23 NOTE — Telephone Encounter (Signed)
Cardiac rehab

## 2019-04-23 NOTE — Telephone Encounter (Signed)
Received referral for this pt to participate in Cardiac Rehab from Dr.Vyas.  Unfortunately the diagnosis listed CAD and Morbid Obesity are not reimbursable by Medicare plans for Cardiac Rehab phase II.  Reviewed pt medical history to determine any other applicable diagnosis. Unable to substantiate a medicare diagnosis for Cardiac Rehab phase II. Pt with recent echo which showed EF 16-10% diastolic CHF.  Pt could participate in Pulmonary Rehab which does not have EF stipulation.  Called and spoke to pt. Spoke to pt extensively regarding the difference between the two programs and the benefit to her.  Pt remains interested and would like her insurance benefits verified.  Pt has follow up with Dr. Woody Seller on 8/24.  Pt aware that currently we do not have any openings until toward the end of September.  Pt would prefer a morning exercise time. Request made to Dr.Vyas for pulmonary rehab referral.  Will forward this to support staff for insurance verification. Cherre Huger, BSN Cardiac and Training and development officer

## 2019-04-23 NOTE — Telephone Encounter (Signed)
I have out referral in for rehab and I called patient to let her know

## 2019-04-24 ENCOUNTER — Other Ambulatory Visit (HOSPITAL_COMMUNITY): Payer: Self-pay | Admitting: *Deleted

## 2019-04-24 DIAGNOSIS — I5032 Chronic diastolic (congestive) heart failure: Secondary | ICD-10-CM

## 2019-04-28 ENCOUNTER — Ambulatory Visit (INDEPENDENT_AMBULATORY_CARE_PROVIDER_SITE_OTHER): Payer: Medicare Other | Admitting: Cardiology

## 2019-04-28 ENCOUNTER — Other Ambulatory Visit: Payer: Self-pay

## 2019-04-28 ENCOUNTER — Encounter: Payer: Self-pay | Admitting: Cardiology

## 2019-04-28 VITALS — BP 140/54 | HR 81 | Ht 62.0 in | Wt 339.1 lb

## 2019-04-28 DIAGNOSIS — I119 Hypertensive heart disease without heart failure: Secondary | ICD-10-CM

## 2019-04-28 DIAGNOSIS — I5032 Chronic diastolic (congestive) heart failure: Secondary | ICD-10-CM | POA: Diagnosis not present

## 2019-04-28 DIAGNOSIS — I519 Heart disease, unspecified: Secondary | ICD-10-CM

## 2019-04-28 DIAGNOSIS — E7800 Pure hypercholesterolemia, unspecified: Secondary | ICD-10-CM

## 2019-04-28 MED ORDER — FUROSEMIDE 20 MG PO TABS: 40.0000 mg | ORAL_TABLET | Freq: Every day | ORAL | 3 refills | Status: DC

## 2019-04-28 MED ORDER — HYDRALAZINE HCL 25 MG PO TABS: 25.0000 mg | ORAL_TABLET | Freq: Three times a day (TID) | ORAL | 3 refills | Status: DC

## 2019-04-28 NOTE — Progress Notes (Signed)
Subjective:  Primary Physician:  Seward Carol, MD  Patient ID: Jeanne [name], female    DOB: 1936-07-28, 83 y.o.   MRN: [id number]   Chief Complaint  Patient presents with  . Hypertension  . Atherosclerosis of native coronary artery of native heart wi  . Hyperlipidemia   HPI: Jeanne [name]  is a 83 y.o. female  She has CAD. Cardiac cath. on 10/13/2015 by Dr. Daneen Schick- proximal to mid LAD-50% lesion, ostial first diagonal-70%, ostial second diagonal-75%. Distal circumflex-40%. RCA was diffusely diseased with 30% mid stenosis. She has been treated with medical therapy.  She denies any chest pain on exertion. She has c/o shortness of breath, unchanged from before. She becomes short of breath on walking in the house.  There is no orthopnea or PND. Patient has not walked outside recently because of the Covid virus situation.She has mild swelling around the ankles. No complaints of palpitation, sudden heart racing or irregular heartbeat. No dizziness, near-syncope or syncope. No history of leg claudication.  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 She does not smoke. She has morbid obesity with BMI greater than 55. She also has history of sleep apnea, uses CPAP regularly.   No history of thyroid problems. No symptoms of TIA. She has history of GERD and CKD.  Past Medical History:  Diagnosis Date  . Anemia   . Arthritis   . Carotid arterial disease ([place])   . Diabetes mellitus   . Hyperlipidemia   . Hypertension   . Migraine   . Obesity   . Reflux   . Sleep apnea     Past Surgical History:  Procedure Laterality Date  . CARDIAC CATHETERIZATION N/A 10/12/2015   Procedure: Left Heart Cath and Coronary Angiography;  Surgeon: Belva Crome, MD;  Location: [facility];  Service: Cardiovascular;  Laterality: N/A;  . CHOLECYSTECTOMY  50 yrs ago  . JOINT REPLACEMENT Right 2005   knee   . WISDOM TOOTH EXTRACTION      Social History   Socioeconomic History  . Marital status: Widowed    Spouse name: Not on file  . Number of children: 2  . Years of education: Not on file  . Highest education level: Not on file  Occupational History  . Not on file  Social Needs  . Financial resource strain: Not on file  . Food insecurity    Worry: Not on file    Inability: Not on file  . Transportation needs    Medical: Not on file    Non-medical: Not on file  Tobacco Use  . Smoking status: Never Smoker  . Smokeless tobacco: Never Used  Substance and Sexual Activity  . Alcohol use: Never    Frequency: Never  . Drug use: No  . Sexual activity: Not on file  Lifestyle  . Physical activity    Days per week: Not on file    Minutes per session: Not on file  . Stress: Not on file  Relationships  . Social Herbalist on phone: Not on file    Gets together: Not on file    Attends religious service: Not on file    Active member of club or organization: Not on file    Attends meetings of clubs or organizations: Not on file    Relationship status: Not on file  . Intimate partner violence    Fear of current or ex  partner: Not on file    Emotionally abused: Not on file    Physically abused: Not on file    Forced sexual activity: Not on file  Other Topics Concern  . Not on file  Social History Narrative  . Not on file    Current Outpatient Medications on File Prior to Visit  Medication Sig Dispense Refill  . albuterol (PROVENTIL HFA;VENTOLIN HFA) 108 (90 Base) MCG/ACT inhaler Inhale 2 puffs into the lungs every 4 (four) hours as needed for wheezing or shortness of breath. 1 Inhaler 0  . aspirin EC 81 MG tablet Take 81 mg by mouth daily.      Marland Kitchen diltiazem (TIAZAC) 240 MG 24 hr capsule Take 1 capsule (240 mg total) by mouth daily. 30 capsule 0  . ezetimibe (ZETIA) 10 MG tablet Take 10 mg by mouth every evening.      . furosemide (LASIX) 20 MG tablet Take 2 tablet (40  mg total) by mouth daily. 30 tablet 3  . insulin glargine (LANTUS) 100 UNIT/ML injection Inject 65 Units into the skin every morning.     . latanoprost (XALATAN) 0.005 % ophthalmic solution Place 1 drop into the left eye at bedtime.  5  . Menthol, Topical Analgesic, (BIOFREEZE EX) Apply 1 application topically daily as needed (back pain).    Marland Kitchen oxybutynin (DITROPAN-XL) 10 MG 24 hr tablet Take 10 mg by mouth daily.      . pantoprazole (PROTONIX) 40 MG tablet Take 40 mg by mouth daily.    . Polyvinyl Alcohol-Povidone (REFRESH OP) Place 1 drop into both eyes at bedtime.    . pravastatin (PRAVACHOL) 40 MG tablet Take 40 mg by mouth daily.     . probenecid (BENEMID) 500 MG tablet Take 500 mg by mouth 2 (two) times daily.  3   No current facility-administered medications on file prior to visit.    Review of Systems  Constitutional: Negative for fever.  HENT: Negative for nosebleeds.   Eyes: Negative for blurred vision.  Respiratory: Positive for shortness of breath. Negative for cough.   Cardiovascular: Positive for leg swelling (minimal). Negative for chest pain and palpitations.  Gastrointestinal: Negative for abdominal pain, nausea and vomiting.  Genitourinary: Negative for dysuria.  Musculoskeletal: Negative for myalgias.  Skin: Negative for itching and rash.  Neurological: Negative for dizziness, seizures and loss of consciousness.  Psychiatric/Behavioral: The patient is not nervous/anxious.       Objective:  Blood pressure (!) 140/54, pulse 81, height 5\' 2"  (1.575 m), weight (!) 339 lb 1.6 oz (153.8 kg), SpO2 99 %. Body mass index is 62.02 kg/m.  Physical Exam  Constitutional: She is oriented to person, place, and time. She appears well-developed.  Morbidly obese.  HENT:  Head: Normocephalic and atraumatic.  Eyes: Conjunctivae are normal.  Neck: No thyromegaly present.  Cardiovascular: Normal rate and regular rhythm. Exam reveals no gallop.  Murmur (2/6 ESM aortic & LPSA) heard.  Pulses:      Carotid pulses are 2+ on the right side and 2+ on the left side with bruit.      Dorsalis pedis pulses are 2+ on the right side and 2+ on the left side.       Posterior tibial pulses are 2+ on the right side and 2+ on the left side.  Pulmonary/Chest: She has no wheezes. She has no rales.  Abdominal: She exhibits no mass. There is no abdominal tenderness.  Musculoskeletal:        General: Edema (minimal  puffiness around ankles) present.  Lymphadenopathy:    She has no cervical adenopathy.  Neurological: She is alert and oriented to person, place, and time.  Skin: Skin is warm.   CARDIAC STUDIES:  Echo- 04/01/2019 1. The left ventricle has normal systolic function with an ejection fraction of 60-65%. The cavity size was normal. There is mildly increased left ventricular wall thickness. Left ventricular diastolic Doppler parameters are consistent with impaired relaxation. Elevated left ventricular end-diastolic pressure The E/e' is >15. No evidence of left ventricular regional wall motion abnormalities.  2. The right ventricle has mildly reduced systolic function. The cavity was moderately enlarged. There is no increase in right ventricular wall thickness.  3. The aortic valve is grossly normal.  4. The aorta is normal in size and structure.  Cardiac cath.- 10/13/2015 by Dr. Mallie Mussel Smith-proximal to mid LAD-50% lesion, ostial first diagonal-70%, ostial second diagonal-75%. Distal circumflex-40%. RCA was diffusely diseased with 30% mid stenosis. She has been treated with medical therapy.  Lexiscan myoview stress test 05/27/2018: 1. Two day lexiscan stress test was performed due to patient's body habitus. . Exercise capacity was not assessed. Stress symptoms included headache, chest pressure, dyspnea and dizziness. Blood pressure was normal. The resting and stress electrocardiogram demonstrated normal sinus rhythm, LAFB, no resting arrhythmias and normal rest repolarization. Stress EKG  is non diagnostic for ischemia as it is a pharmacologic stress. 2. The overall quality of the study is fair, limited due to patient's body habitus. Left ventricular cavity is noted to be enlarged on the rest and stress studies. Gated SPECT images reveal mild global decrease in myocardial thickening and wall motion. The left ventricular ejection fraction was calculated or visually estimated to be 33%. REST and STRESS images demonstrate moderate sized area of mildly decreased tracer uptake in the basal inferior, mid inferior and apical inferior segments of the left ventricle with no significant reversibility. While this could represent tissue attenuation artifact, ischemia/infarct in this region cannot be excluded. Clinical and echocardiographic correlation recommended. 3. High risk study due to low LVEF.  Carotid artery duplex 09/09/2018: No significant stenosis right ICA. Stenosis in the left internal carotid artery (50-69%). Antegrade right vertebral artery flow. Antegrade left vertebral artery flow. Follow up in six months is appropriate if clinically indicated.  ABI- 10/15/2018- right-1.1, left-1.0  Assessment & Recommendations:   Atherosclerosis of native coronary artery of native heart without angina pectoris   Hypertension with heart disease   Chronic diastolic heart failure  Stenosis of left carotid artery   Hypercholesteremia   Morbid obesity ([place])   Laboratory Exam:  CBC Latest Ref Rng & Units 03/31/2019 08/06/2018 04/01/2018  WBC 4.0 - 10.5 K/uL 5.9 6.8 5.7  Hemoglobin 12.0 - 15.0 g/dL 10.5(L) 11.1(L) 10.6(L)  Hematocrit 36.0 - 46.0 % 34.7(L) 36.4 34.2(L)  Platelets 150 - 400 K/uL 255 238 233   CMP Latest Ref Rng & Units 04/01/2019 03/31/2019 08/06/2018  Glucose 70 - 99 mg/dL 198(H) 259(H) 86  BUN 8 - 23 mg/dL 45(H) 46(H) 21  Creatinine 0.44 - 1.00 mg/dL 2.15(H) 2.38(H) 1.62(H)  Sodium 135 - 145 mmol/L 136 137 140  Potassium 3.5 - 5.1 mmol/L 3.6 3.7 3.1(L)  Chloride 98 -  111 mmol/L 94(L) 94(L) 98  CO2 22 - 32 mmol/L 28 31 27   Calcium 8.9 - 10.3 mg/dL 9.2 9.2 9.1  Total Protein 6.5 - 8.1 g/dL - - -  Total Bilirubin 0.3 - 1.2 mg/dL - - -  Alkaline Phos 38 - 126 U/L - - -  AST 15 - 41 U/L - - -  ALT 0 - 44 U/L - - -   Lipid Panel     Component Value Date/Time   CHOL 92 03/31/2019 1443   TRIG 77 03/31/2019 1443   HDL 32 (L) 03/31/2019 1443   CHOLHDL 2.9 03/31/2019 1443   VLDL 15 03/31/2019 1443   LDLCALC 45 03/31/2019 1443    Recommendation:  Patient continues to have shortness of breath on minimal activities like walking in the house.  She has chronic diastolic congestive heart failure.  She has been checking blood pressures at home and systolic pressure has been elevated.  Most of the times, systolic pressure has been in upper 130s-150s.  Today her systolic pressure is 675.  I have added hydralazine 25 mg 3 times a day.  She will continue furosemide 40 mg daily and continue other medications.  Patient has been referred for pulmonary rehabilitation, it was explained to her.  Secondary prevention was again discussed. She was advised to follow strict ADA, low-salt, low-cholesterol diet. Patient was also advised strongly to continue calorie restriction to lose weight. She has morbid obesity, deleterious health effects of morbid obesity were explained to her. Patient was given dietary instructions and was given diet sheet.  She will return for follow-up after 3 months.                         Despina Hick, MD, [facility] 04/28/2019, 10:12 AM [facility]. [place] Pager: [phone] Office: [phone] If no answer Cell [phone]

## 2019-04-30 ENCOUNTER — Telehealth (HOSPITAL_COMMUNITY): Payer: Self-pay

## 2019-04-30 NOTE — Telephone Encounter (Signed)
Pt insurance is active and benefits verified through [employer]. Co-pay $10.00, DED $0.00/$0.00 met, out of pocket $2,000.00/$40.00 met, co-insurance 0%. No pre-authorization required. Misty/[employer], 04/30/2019 @ 10:04AM, REF#[id number] °

## 2019-05-01 ENCOUNTER — Telehealth (HOSPITAL_COMMUNITY): Payer: Self-pay

## 2019-05-29 ENCOUNTER — Other Ambulatory Visit: Payer: Self-pay

## 2019-05-29 ENCOUNTER — Encounter (HOSPITAL_COMMUNITY)
Admission: RE | Admit: 2019-05-29 | Discharge: 2019-05-29 | Disposition: A | Discharge status: Home / Self Care | Payer: Medicare Other | Source: Ambulatory Visit | Attending: Pulmonary Disease | Admitting: Pulmonary Disease

## 2019-05-29 VITALS — BP 118/66 | HR 84 | Ht 63.0 in | Wt 331.1 lb

## 2019-05-29 DIAGNOSIS — I5032 Chronic diastolic (congestive) heart failure: Secondary | ICD-10-CM | POA: Diagnosis present

## 2019-05-29 NOTE — Progress Notes (Signed)
Jeanne [name] 83 y.o. female Pulmonary Rehab Orientation Note Patient arrived today in Cardiac [facility] Pul[facility] Rehab for orientation to Pulmonary Rehab. She was transported from [employer] via wheel chair. She does not carry portable oxygen, she uses a CPAP at night.  Per pt, she uses oxygen never. Color good, skin warm and dry. Patient is oriented to time and place. Patient's medical history, psychosocial health, and medications reviewed. Psychosocial assessment reveals patient's daughters live with her.  Pt is currently retired. Pt reports her stress level is low. Areas of stress/anxiety include Health.  Pt does not exhibit  signs of depression. PHQ2/9 score 0/0. Pt shows good  coping skills with positive outlook . Will continue to monitor and evaluate progress toward psychosocial goal(s) of no barriers to participation in pulmonary rehab. Physical assessment reveals heart rate is normal, breath sounds clear to auscultation, no wheezes, rales, or rhonchi. Grip strength equal, strong. Patient reports she does take medications as prescribed. Patient states she follows a Low Sodium diet. The patient has been trying to lose weight through a healthy diet and exercise program.. Patient's weight will be monitored closely. Demonstration and practice of PLB using pulse oximeter. Patient able to return demonstration satisfactorily. Safety and hand hygiene in the exercise area reviewed with patient. Patient voices understanding of the information reviewed. Department expectations discussed with patient and achievable goals were set. The patient shows enthusiasm about attending the program and we look forward to working with this nice lady. The patient completed a 6 min walk test today and begins exercise on Tuesday, June 03, 2019 in the 1030.  [id number]

## 2019-05-30 NOTE — Progress Notes (Signed)
Pulmonary Individual Treatment Plan  Patient Details  Name: Jeanne [name] MRN: [id number] Date of Birth: February 14, 1936 Referring Provider:     Pulmonary Rehab Walk Test from 05/29/2019 in [place]  Referring Provider  Dr. Woody Seller      Initial Encounter Date:    Pulmonary Rehab Walk Test from 05/29/2019 in [place]  Date  05/29/19      Visit Diagnosis: Heart failure, diastolic, chronic ([place])  Patient's Home Medications on Admission:   Current Outpatient Medications:  .  aspirin EC 81 MG tablet, Take 81 mg by mouth daily.  , Disp: , Rfl:  .  diltiazem (TIAZAC) 240 MG 24 hr capsule, Take 1 capsule (240 mg total) by mouth daily., Disp: 30 capsule, Rfl: 0 .  ezetimibe (ZETIA) 10 MG tablet, Take 10 mg by mouth every evening.  , Disp: , Rfl:  .  hydrALAZINE (APRESOLINE) 25 MG tablet, Take 1 tablet (25 mg total) by mouth 3 (three) times daily., Disp: 270 tablet, Rfl: 3 .  insulin glargine (LANTUS) 100 UNIT/ML injection, Inject 65 Units into the skin every morning. , Disp: , Rfl:  .  latanoprost (XALATAN) 0.005 % ophthalmic solution, Place 1 drop into the left eye at bedtime., Disp: , Rfl: 5 .  Menthol, Topical Analgesic, (BIOFREEZE EX), Apply 1 application topically daily as needed (back pain)., Disp: , Rfl:  .  oxybutynin (DITROPAN-XL) 10 MG 24 hr tablet, Take 10 mg by mouth daily.  , Disp: , Rfl:  .  pantoprazole (PROTONIX) 40 MG tablet, Take 40 mg by mouth daily., Disp: , Rfl:  .  Polyvinyl Alcohol-Povidone (REFRESH OP), Place 1 drop into both eyes at bedtime., Disp: , Rfl:  .  pravastatin (PRAVACHOL) 40 MG tablet, Take 40 mg by mouth daily. , Disp: , Rfl:  .  probenecid (BENEMID) 500 MG tablet, Take 500 mg by mouth 2 (two) times daily., Disp: , Rfl: 3 .  albuterol (PROVENTIL HFA;VENTOLIN HFA) 108 (90 Base) MCG/ACT inhaler, Inhale 2 puffs into the lungs every 4 (four) hours as needed for wheezing or shortness of breath.  (Patient not taking: Reported on 05/29/2019), Disp: 1 Inhaler, Rfl: 0 .  furosemide (LASIX) 20 MG tablet, Take 2 tablets (40 mg total) by mouth daily., Disp: 30 tablet, Rfl: 3  Past Medical History: Past Medical History:  Diagnosis Date  . Anemia   . Arthritis   . Carotid arterial disease ([place])   . Diabetes mellitus   . Hyperlipidemia   . Hypertension   . Migraine   . Obesity   . Reflux   . Sleep apnea     Tobacco Use: Social History   Tobacco Use  Smoking Status Never Smoker  Smokeless Tobacco Never Used    Labs: Recent Review Flowsheet Data    Labs for ITP Cardiac and Pulmonary Rehab Latest Ref Rng & Units 02/02/2013 10/09/2015 10/12/2015 03/31/2019   Cholestrol 0 - 200 mg/dL - - 90 92   LDLCALC 0 - 99 mg/dL - - 35 45   HDL >40 mg/dL - - 28(L) 32(L)   Trlycerides <150 mg/dL - - 134 77   Hemoglobin A1c 4.8 - 5.6 % - 6.6(H) - -   TCO2 0 - 100 mmol/L 32 - - -      Capillary Blood Glucose: Lab Results  Component Value Date   GLUCAP 145 (H) 04/01/2019   GLUCAP 145 (H) 04/01/2019   GLUCAP 95 03/31/2019   GLUCAP 58 (  L) 10/12/2015   GLUCAP 112 (H) 10/12/2015     Pulmonary Assessment Scores: Pulmonary Assessment Scores    Row Name 05/29/19 1626         ADL UCSD   SOB Score total  80       CAT Score   CAT Score  31       UCSD: Self-administered rating of dyspnea associated with activities of daily living (ADLs) 6-point scale (0 = "not at all" to 5 = "maximal or unable to do because of breathlessness")  Scoring Scores range from 0 to 120.  Minimally important difference is 5 units  CAT: CAT can identify the health impairment of COPD patients and is better correlated with disease progression.  CAT has a scoring range of zero to 40. The CAT score is classified into four groups of low (less than 10), medium (10 - 20), high (21-30) and very high (31-40) based on the impact level of disease on health status. A CAT score over 10 suggests significant symptoms.  A  worsening CAT score could be explained by an exacerbation, poor medication adherence, poor inhaler technique, or progression of COPD or comorbid conditions.  CAT MCID is 2 points  mMRC: mMRC (Modified Medical Research Council) Dyspnea Scale is used to assess the degree of baseline functional disability in patients of respiratory disease due to dyspnea. No minimal important difference is established. A decrease in score of 1 point or greater is considered a positive change.   Pulmonary Function Assessment: Pulmonary Function Assessment - 05/29/19 1626      Breath   Shortness of Breath  Yes;Limiting activity       Exercise Target Goals: Exercise Program Goal: Individual exercise prescription set using results from initial 6 min walk test and THRR while considering  patient's activity barriers and safety.   Exercise Prescription Goal: Initial exercise prescription builds to 30-45 minutes a day of aerobic activity, 2-3 days per week.  Home exercise guidelines will be given to patient during program as part of exercise prescription that the participant will acknowledge.  Activity Barriers & Risk Stratification: Activity Barriers & Cardiac Risk Stratification - 05/29/19 1518      Activity Barriers & Cardiac Risk Stratification   Activity Barriers  Arthritis;Deconditioning;Muscular Weakness;Shortness of Breath;Balance Concerns       6 Minute Walk: 6 Minute Walk    Row Name 05/29/19 1535         6 Minute Walk   Phase  Initial     Distance  460 feet     Walk Time  6 minutes     # of Rest Breaks  2 Stop at minute 1:32, Start back at minute 2:56. Stop at minute 4:23, Start back at minute 5:37.     MPH  0.87     METS  -0.6     RPE  15     Perceived Dyspnea   3     VO2 Peak  -2.16     Resting HR  90 bpm     Resting BP  118/66     Resting Oxygen Saturation   99 %     Exercise Oxygen Saturation  during 6 min walk  97 %     Max Ex. HR  127 bpm     Max Ex. BP  126/80     2 Minute  Post BP  108/64       Interval HR   1 Minute HR  124  2 Minute HR  118     3 Minute HR  107     4 Minute HR  127     5 Minute HR  114     6 Minute HR  113     2 Minute Post HR  81     Interval Heart Rate?  Yes       Interval Oxygen   Interval Oxygen?  Yes     Baseline Oxygen Saturation %  99 %     1 Minute Oxygen Saturation %  95 %     2 Minute Oxygen Saturation %  98 %     3 Minute Oxygen Saturation %  99 %     4 Minute Oxygen Saturation %  97 %     5 Minute Oxygen Saturation %  98 %     6 Minute Oxygen Saturation %  97 %     2 Minute Post Oxygen Saturation %  98 %        Oxygen Initial Assessment: Oxygen Initial Assessment - 05/29/19 1625      Home Oxygen   Sleep Oxygen Prescription  CPAP    Home Exercise Oxygen Prescription  None    Home at Rest Exercise Oxygen Prescription  None      Initial 6 min Walk   Oxygen Used  None      Program Oxygen Prescription   Program Oxygen Prescription  None       Oxygen Re-Evaluation:   Oxygen Discharge (Final Oxygen Re-Evaluation):   Initial Exercise Prescription: Initial Exercise Prescription - 05/29/19 1415      Date of Initial Exercise RX and Referring Provider   Date  05/29/19    Referring Provider  Dr. Woody Seller      NuStep   Level  2    SPM  75    Minutes  15      Prescription Details   Frequency (times per week)  3    Duration  Progress to 30 minutes of continuous aerobic without signs/symptoms of physical distress      Intensity   THRR 40-80% of Max Heartrate  56-111    Ratings of Perceived Exertion  11-13    Perceived Dyspnea  0-4      Progression   Progression  Continue to progress workloads to maintain intensity without signs/symptoms of physical distress.      Resistance Training   Training Prescription  Yes    Weight  --   Orange Bands   Reps  10-15       Perform Capillary Blood Glucose checks as needed.  Exercise Prescription Changes:   Exercise Comments:   Exercise Goals and  Review:   Exercise Goals Re-Evaluation :   Discharge Exercise Prescription (Final Exercise Prescription Changes):   Nutrition:  Target Goals: Understanding of nutrition guidelines, daily intake of sodium 1500mg , cholesterol 200mg , calories 30% from fat and 7% or less from saturated fats, daily to have 5 or more servings of fruits and vegetables.  Biometrics: Pre Biometrics - 05/29/19 1538      Pre Biometrics   Height  5\' 3"  (1.6 m)    Weight  (!) 150.2 kg    BMI (Calculated)  58.67        Nutrition Therapy Plan and Nutrition Goals:   Nutrition Assessments:   Nutrition Goals Re-Evaluation:   Nutrition Goals Discharge (Final Nutrition Goals Re-Evaluation):   Psychosocial: Target Goals: Acknowledge presence or absence of significant depression  and/or stress, maximize coping skills, provide positive support system. Participant is able to verbalize types and ability to use techniques and skills needed for reducing stress and depression.  Initial Review & Psychosocial Screening: Initial Psych Review & Screening - 05/29/19 1630      Initial Review   Current issues with  None Identified      Family Dynamics   Good Support System?  Yes      Barriers   Psychosocial barriers to participate in program  There are no identifiable barriers or psychosocial needs.      Screening Interventions   Interventions  Encouraged to exercise       Quality of Life Scores:  Scores of 19 and below usually indicate a poorer quality of life in these areas.  A difference of  2-3 points is a clinically meaningful difference.  A difference of 2-3 points in the total score of the Quality of Life Index has been associated with significant improvement in overall quality of life, self-image, physical symptoms, and general health in studies assessing change in quality of life.  PHQ-9: Recent Review Flowsheet Data    Depression screen [facility] 2/9 05/29/2019   Decreased Interest 0   Down,  Depressed, Hopeless 0   PHQ - 2 Score 0   Altered sleeping 0   Tired, decreased energy 0   Change in appetite 0   Feeling bad or failure about yourself  0   Trouble concentrating 0   Moving slowly or fidgety/restless 0   Suicidal thoughts 0   PHQ-9 Score 0   Difficult doing work/chores Not difficult at all     Interpretation of Total Score  Total Score Depression Severity:  1-4 = Minimal depression, 5-9 = Mild depression, 10-14 = Moderate depression, 15-19 = Moderately severe depression, 20-27 = Severe depression   Psychosocial Evaluation and Intervention: Psychosocial Evaluation - 05/29/19 1630      Psychosocial Evaluation & Interventions   Interventions  Encouraged to exercise with the program and follow exercise prescription    Comments  No barriers or psychosocial concerns identified    Expected Outcomes  No barriers to participation in pulmonary rehab.    Continue Psychosocial Services   No Follow up required       Psychosocial Re-Evaluation:   Psychosocial Discharge (Final Psychosocial Re-Evaluation):   Education: Education Goals: Education classes will be provided on a weekly basis, covering required topics. Participant will state understanding/return demonstration of topics presented.  Learning Barriers/Preferences:   Education Topics: Risk Factor Reduction:  -Group instruction that is supported by a PowerPoint presentation. Instructor discusses the definition of a risk factor, different risk factors for pulmonary disease, and how the heart and lungs work together.     Nutrition for Pulmonary Patient:  -Group instruction provided by PowerPoint slides, verbal discussion, and written materials to support subject matter. The instructor gives an explanation and review of healthy diet recommendations, which includes a discussion on weight management, recommendations for fruit and vegetable consumption, as well as protein, fluid, caffeine, fiber, sodium, sugar, and  alcohol. Tips for eating when patients are short of breath are discussed.   Pursed Lip Breathing:  -Group instruction that is supported by demonstration and informational handouts. Instructor discusses the benefits of pursed lip and diaphragmatic breathing and detailed demonstration on how to preform both.     Oxygen Safety:  -Group instruction provided by PowerPoint, verbal discussion, and written material to support subject matter. There is an overview of "What is Oxygen" and "  Why do we need it".  Instructor also reviews how to create a safe environment for oxygen use, the importance of using oxygen as prescribed, and the risks of noncompliance. There is a brief discussion on traveling with oxygen and resources the patient may utilize.   Oxygen Equipment:  -Group instruction provided by [facility] Staff utilizing handouts, written materials, and equipment demonstrations.   Signs and Symptoms:  -Group instruction provided by written material and verbal discussion to support subject matter. Warning signs and symptoms of infection, stroke, and heart attack are reviewed and when to call the physician/911 reinforced. Tips for preventing the spread of infection discussed.   Advanced Directives:  -Group instruction provided by verbal instruction and written material to support subject matter. Instructor reviews Advanced Directive laws and proper instruction for filling out document.   Pulmonary Video:  -Group video education that reviews the importance of medication and oxygen compliance, exercise, good nutrition, pulmonary hygiene, and pursed lip and diaphragmatic breathing for the pulmonary patient.   Exercise for the Pulmonary Patient:  -Group instruction that is supported by a PowerPoint presentation. Instructor discusses benefits of exercise, core components of exercise, frequency, duration, and intensity of an exercise routine, importance of utilizing pulse oximetry during exercise,  safety while exercising, and options of places to exercise outside of rehab.     Pulmonary Medications:  -Verbally interactive group education provided by instructor with focus on inhaled medications and proper administration.   Anatomy and Physiology of the Respiratory System and Intimacy:  -Group instruction provided by PowerPoint, verbal discussion, and written material to support subject matter. Instructor reviews respiratory cycle and anatomical components of the respiratory system and their functions. Instructor also reviews differences in obstructive and restrictive respiratory diseases with examples of each. Intimacy, Sex, and Sexuality differences are reviewed with a discussion on how relationships can change when diagnosed with pulmonary disease. Common sexual concerns are reviewed.   MD DAY -A group question and answer session with a medical doctor that allows participants to ask questions that relate to their pulmonary disease state.   OTHER EDUCATION -Group or individual verbal, written, or video instructions that support the educational goals of the pulmonary rehab program.   Holiday Eating Survival Tips:  -Group instruction provided by PowerPoint slides, verbal discussion, and written materials to support subject matter. The instructor gives patients tips, tricks, and techniques to help them not only survive but enjoy the holidays despite the onslaught of food that accompanies the holidays.   Knowledge Questionnaire Score: Knowledge Questionnaire Score - 05/29/19 1627      Knowledge Questionnaire Score   Pre Score  14/18       Core Components/Risk Factors/Patient Goals at Admission: Personal Goals and Risk Factors at Admission - 05/29/19 1627      Core Components/Risk Factors/Patient Goals on Admission    Weight Management  Obesity    Improve shortness of breath with ADL's  Yes    Intervention  Provide education, individualized exercise plan and daily activity  instruction to help decrease symptoms of SOB with activities of daily living.    Expected Outcomes  Short Term: Improve cardiorespiratory fitness to achieve a reduction of symptoms when performing ADLs;Long Term: Be able to perform more ADLs without symptoms or delay the onset of symptoms       Core Components/Risk Factors/Patient Goals Review:  Goals and Risk Factor Review    Row Name 05/29/19 1629             Core Components/Risk  Factors/Patient Goals Review   Personal Goals Review  Weight Management/Obesity;Increase knowledge of respiratory medications and ability to use respiratory devices properly.;Improve shortness of breath with ADL's;Develop more efficient breathing techniques such as purse lipped breathing and diaphragmatic breathing and practicing self-pacing with activity.          Core Components/Risk Factors/Patient Goals at Discharge (Final Review):  Goals and Risk Factor Review - 05/29/19 1629      Core Components/Risk Factors/Patient Goals Review   Personal Goals Review  Weight Management/Obesity;Increase knowledge of respiratory medications and ability to use respiratory devices properly.;Improve shortness of breath with ADL's;Develop more efficient breathing techniques such as purse lipped breathing and diaphragmatic breathing and practicing self-pacing with activity.       ITP Comments:   Comments:

## 2019-06-03 ENCOUNTER — Other Ambulatory Visit: Payer: Self-pay

## 2019-06-03 ENCOUNTER — Encounter (HOSPITAL_COMMUNITY)
Admission: RE | Admit: 2019-06-03 | Discharge: 2019-06-03 | Disposition: A | Discharge status: Home / Self Care | Payer: Medicare Other | Source: Ambulatory Visit | Attending: Pulmonary Disease | Admitting: Pulmonary Disease

## 2019-06-03 ENCOUNTER — Encounter (HOSPITAL_COMMUNITY): Payer: Medicare Other

## 2019-06-03 DIAGNOSIS — I5032 Chronic diastolic (congestive) heart failure: Secondary | ICD-10-CM

## 2019-06-03 NOTE — Progress Notes (Signed)
Daily Session Note  Patient Details  Name: Jeanne [name] MRN: [id number] Date of Birth: [birth year] Referring Provider:     Pulmonary Rehab Walk Test from 05/29/2019 in [place]  Referring Provider  Dr. Woody Seller      Encounter Date: 06/03/2019  Check In: Session Check In - 06/03/19 1030      Check-In   Supervising physician immediately available to respond to emergencies  [facility] immediately available    Physician(s)  Dr. Nevada Crane    Location  [facility]    Staff Present  Rosebud Poles, RN, Bjorn Loser, MS, Exercise Physiologist;Lisa Ysidro Evert, RN    Virtual Visit  No    Medication changes reported      No    Fall or balance concerns reported     No    Tobacco Cessation  No Change    Warm-up and Cool-down  Performed as group-led instruction    Resistance Training Performed  Yes    VAD Patient?  No    PAD/SET Patient?  No      Pain Assessment   Currently in Pain?  No/denies    Multiple Pain Sites  No       Capillary Blood Glucose: No results found for this or any previous visit (from the past 24 hour(s)).    Social History   Tobacco Use  Smoking Status Never Smoker  Smokeless Tobacco Never Used    Goals Met:  Proper associated with RPD/PD & O2 Sat Exercise tolerated well Strength training completed today  Goals Unmet:  Not Applicable  Comments: Service time is from [place] to 1132    Dr. Rush Farmer is Medical Director for Pulmonary Rehab at [facility].

## 2019-06-05 ENCOUNTER — Other Ambulatory Visit: Payer: Self-pay

## 2019-06-05 ENCOUNTER — Encounter (HOSPITAL_COMMUNITY): Payer: Medicare Other

## 2019-06-05 ENCOUNTER — Encounter (HOSPITAL_COMMUNITY)
Admission: RE | Admit: 2019-06-05 | Discharge: 2019-06-05 | Disposition: A | Discharge status: Home / Self Care | Payer: Medicare Other | Source: Ambulatory Visit | Attending: Pulmonary Disease | Admitting: Pulmonary Disease

## 2019-06-05 DIAGNOSIS — I5032 Chronic diastolic (congestive) heart failure: Secondary | ICD-10-CM | POA: Insufficient documentation

## 2019-06-05 NOTE — Progress Notes (Signed)
Daily Session Note  Patient Details  Name: Jeanne [name] MRN: [id number] Date of Birth: 08-07-1936 Referring Provider:     Pulmonary Rehab Walk Test from 05/29/2019 in [place]  Referring Provider  Dr. Woody Seller      Encounter Date: 06/05/2019  Check In: Session Check In - 06/05/19 1038      Check-In   Supervising physician immediately available to respond to emergencies  [facility] immediately available    Physician(s)  Dr. Benny Lennert    Location  [facility]    Staff Present  Rosebud Poles, RN, Bjorn Loser, MS, Exercise Physiologist;Lisa Ysidro Evert, RN    Virtual Visit  No    Medication changes reported      No    Fall or balance concerns reported     No    Tobacco Cessation  No Change    Warm-up and Cool-down  Performed as group-led instruction    Resistance Training Performed  Yes    VAD Patient?  No    PAD/SET Patient?  No      Pain Assessment   Currently in Pain?  No/denies    Multiple Pain Sites  No       Capillary Blood Glucose: No results found for this or any previous visit (from the past 24 hour(s)).    Social History   Tobacco Use  Smoking Status Never Smoker  Smokeless Tobacco Never Used    Goals Met:  Proper associated with RPD/PD & O2 Sat Exercise tolerated well Strength training completed today  Goals Unmet:  Not Applicable  Comments: Service time is from 1020 to [place]    Dr. Rush Farmer is Medical Director for Pulmonary Rehab at [facility].

## 2019-06-10 ENCOUNTER — Other Ambulatory Visit: Payer: Self-pay

## 2019-06-10 ENCOUNTER — Encounter (HOSPITAL_COMMUNITY): Payer: Medicare Other

## 2019-06-10 ENCOUNTER — Encounter (HOSPITAL_COMMUNITY)
Admission: RE | Admit: 2019-06-10 | Discharge: 2019-06-10 | Disposition: A | Discharge status: Home / Self Care | Payer: Medicare Other | Source: Ambulatory Visit | Attending: Pulmonary Disease | Admitting: Pulmonary Disease

## 2019-06-10 DIAGNOSIS — I5032 Chronic diastolic (congestive) heart failure: Secondary | ICD-10-CM

## 2019-06-10 NOTE — Progress Notes (Signed)
Daily Session Note  Patient Details  Name: AMELITA RISINGER MRN: [id number] Date of Birth: [birth year] Referring Provider:     Pulmonary Rehab Walk Test from 05/29/2019 in [place]  Referring Provider  Dr. Woody Seller      Encounter Date: 06/10/2019  Check In: Session Check In - 06/10/19 1148      Check-In   Supervising physician immediately available to respond to emergencies  [facility] immediately available    Physician(s)  Dr. Benny Lennert    Location  [facility]    Staff Present  Rosebud Poles, RN, Bjorn Loser, MS, Exercise Physiologist;Carlette Wilber Oliphant, RN, BSN    Virtual Visit  No    Medication changes reported      No    Fall or balance concerns reported     No    Tobacco Cessation  No Change    Warm-up and Cool-down  Performed as group-led instruction    Resistance Training Performed  Yes    VAD Patient?  No      Pain Assessment   Currently in Pain?  No/denies    Multiple Pain Sites  No       Capillary Blood Glucose: No results found for this or any previous visit (from the past 24 hour(s)).  Exercise Prescription Changes - 06/10/19 1100      Response to Exercise   Blood Pressure (Admit)  122/60    Blood Pressure (Exercise)  118/60    Blood Pressure (Exit)  128/80    Heart Rate (Admit)  86 bpm    Heart Rate (Exercise)  98 bpm    Heart Rate (Exit)  83 bpm    Oxygen Saturation (Admit)  98 %    Oxygen Saturation (Exercise)  98 %    Oxygen Saturation (Exit)  99 %    Rating of Perceived Exertion (Exercise)  13    Perceived Dyspnea (Exercise)  1    Duration  Continue with 30 min of aerobic exercise without signs/symptoms of physical distress.    Intensity  --   40-80% HRR     Progression   Progression  Continue to progress workloads to maintain intensity without signs/symptoms of physical distress.      Resistance Training   Training Prescription  Yes    Weight  orange bands    Reps  10-15    Time  10  Minutes      Interval Training   Interval Training  No      NuStep   Level  2    SPM  75    Minutes  15    METs  1.4       Social History   Tobacco Use  Smoking Status Never Smoker  Smokeless Tobacco Never Used    Goals Met:  Proper associated with RPD/PD & O2 Sat Exercise tolerated well Strength training completed today  Goals Unmet:  Not Applicable  Comments: Service time is from 1035 to [place]    Dr. Rush Farmer is Medical Director for Pulmonary Rehab at [facility].

## 2019-06-12 ENCOUNTER — Encounter (HOSPITAL_COMMUNITY): Payer: Medicare Other

## 2019-06-12 ENCOUNTER — Other Ambulatory Visit: Payer: Self-pay

## 2019-06-12 ENCOUNTER — Encounter (HOSPITAL_COMMUNITY)
Admission: RE | Admit: 2019-06-12 | Discharge: 2019-06-12 | Disposition: A | Discharge status: Home / Self Care | Payer: Medicare Other | Source: Ambulatory Visit | Attending: Pulmonary Disease | Admitting: Pulmonary Disease

## 2019-06-12 DIAGNOSIS — I5032 Chronic diastolic (congestive) heart failure: Secondary | ICD-10-CM

## 2019-06-12 NOTE — Progress Notes (Signed)
Man did not exercise in pulmonary rehab due to constipation.  She c/o nausea, given suggestions to use over the counter enema with mineral oil, and/or miralax 2 times/day.

## 2019-06-17 ENCOUNTER — Encounter (HOSPITAL_COMMUNITY)
Admission: RE | Admit: 2019-06-17 | Discharge: 2019-06-17 | Disposition: A | Discharge status: Home / Self Care | Payer: Medicare Other | Source: Ambulatory Visit | Attending: Pulmonary Disease | Admitting: Pulmonary Disease

## 2019-06-17 ENCOUNTER — Other Ambulatory Visit: Payer: Self-pay

## 2019-06-17 ENCOUNTER — Encounter (HOSPITAL_COMMUNITY): Payer: Medicare Other

## 2019-06-17 DIAGNOSIS — I5032 Chronic diastolic (congestive) heart failure: Secondary | ICD-10-CM | POA: Diagnosis not present

## 2019-06-17 NOTE — Progress Notes (Signed)
Daily Session Note  Patient Details  Name: Jeanne [name] MRN: [id number] Date of Birth: 1936/07/01 Referring Provider:     Pulmonary Rehab Walk Test from 05/29/2019 in [place]  Referring Provider  Dr. Woody Seller      Encounter Date: 06/17/2019  Check In:   Capillary Blood Glucose: No results found for this or any previous visit (from the past 24 hour(s)).    Social History   Tobacco Use  Smoking Status Never Smoker  Smokeless Tobacco Never Used    Goals Met:  Proper associated with RPD/PD & O2 Sat Exercise tolerated well Strength training completed today  Goals Unmet:  Not Applicable  Comments: Service time is from 1020 to [place]    Dr. Rush Farmer is Medical Director for Pulmonary Rehab at [facility].

## 2019-06-19 ENCOUNTER — Encounter (HOSPITAL_COMMUNITY): Payer: Medicare Other

## 2019-06-19 ENCOUNTER — Encounter (HOSPITAL_COMMUNITY)
Admission: RE | Admit: 2019-06-19 | Discharge: 2019-06-19 | Disposition: A | Discharge status: Home / Self Care | Payer: Medicare Other | Source: Ambulatory Visit | Attending: Pulmonary Disease | Admitting: Pulmonary Disease

## 2019-06-19 ENCOUNTER — Other Ambulatory Visit: Payer: Self-pay

## 2019-06-19 DIAGNOSIS — I5032 Chronic diastolic (congestive) heart failure: Secondary | ICD-10-CM

## 2019-06-19 NOTE — Progress Notes (Signed)
Daily Session Note  Patient Details  Name: Jeanne [name] MRN: [id number] Date of Birth: 1936/04/28 Referring Provider:     Pulmonary Rehab Walk Test from 05/29/2019 in [place]  Referring Provider  Dr. Woody Seller      Encounter Date: 06/19/2019  Check In: Session Check In - 06/19/19 1137      Check-In   Supervising physician immediately available to respond to emergencies  [facility] immediately available    Physician(s)  Dr. Doristine Bosworth    Location  [facility]    Staff Present  Hoy Register, MS, Exercise Physiologist;Binta Statzer Colletta [place], RN, MHA    Virtual Visit  No    Medication changes reported      No    Fall or balance concerns reported     No    Tobacco Cessation  No Change    Warm-up and Cool-down  Performed on first and last piece of equipment    Resistance Training Performed  Yes    VAD Patient?  No    PAD/SET Patient?  No      Pain Assessment   Currently in Pain?  No/denies    Multiple Pain Sites  No       Capillary Blood Glucose: No results found for this or any previous visit (from the past 24 hour(s)).    Social History   Tobacco Use  Smoking Status Never Smoker  Smokeless Tobacco Never Used    Goals Met:  Exercise tolerated well No report of cardiac concerns or symptoms Strength training completed today  Goals Unmet:  Not Applicable  Comments: Service time is from 1012 to 1125    Dr. Rush Farmer is Medical Director for Pulmonary Rehab at [facility].

## 2019-06-24 ENCOUNTER — Encounter (HOSPITAL_COMMUNITY): Payer: Medicare Other

## 2019-06-24 ENCOUNTER — Encounter (HOSPITAL_COMMUNITY)
Admission: RE | Admit: 2019-06-24 | Discharge: 2019-06-24 | Disposition: A | Discharge status: Home / Self Care | Payer: Medicare Other | Source: Ambulatory Visit | Attending: Pulmonary Disease | Admitting: Pulmonary Disease

## 2019-06-24 ENCOUNTER — Other Ambulatory Visit: Payer: Self-pay

## 2019-06-24 VITALS — Wt 327.8 lb

## 2019-06-24 DIAGNOSIS — I5032 Chronic diastolic (congestive) heart failure: Secondary | ICD-10-CM

## 2019-06-24 NOTE — Progress Notes (Signed)
Daily Session Note  Patient Details  Name: Jeanne [name] MRN: [id number] Date of Birth: 04-09-1936 Referring Provider:     Pulmonary Rehab Walk Test from 05/29/2019 in [place]  Referring Provider  Dr. Woody Seller      Encounter Date: 06/24/2019  Check In: Session Check In - 06/24/19 1135      Check-In   Supervising physician immediately available to respond to emergencies  [facility] immediately available    Physician(s)  Dr. Doristine Bosworth    Location  [facility]    Staff Present  Hoy Register, MS, Exercise Physiologist;Alicen Donalson Ysidro Evert, RN;Portia Rollene Rotunda, RN, BSN    Virtual Visit  No    Medication changes reported      No    Fall or balance concerns reported     No    Tobacco Cessation  No Change    Warm-up and Cool-down  Performed on first and last piece of equipment    Resistance Training Performed  Yes    VAD Patient?  No    PAD/SET Patient?  No      Pain Assessment   Currently in Pain?  No/denies    Multiple Pain Sites  No       Capillary Blood Glucose: No results found for this or any previous visit (from the past 24 hour(s)). POCT Glucose - 06/24/19 1203      POCT Blood Glucose   Pre-Exercise  144 mg/dL    Post-Exercise  208 mg/dL      Exercise Prescription Changes - 06/24/19 1200      Response to Exercise   Blood Pressure (Admit)  120/80    Blood Pressure (Exercise)  120/80    Blood Pressure (Exit)  118/72    Heart Rate (Admit)  90 bpm    Heart Rate (Exercise)  97 bpm    Heart Rate (Exit)  97 bpm    Oxygen Saturation (Admit)  98 %    Oxygen Saturation (Exercise)  99 %    Oxygen Saturation (Exit)  97 %    Rating of Perceived Exertion (Exercise)  11    Perceived Dyspnea (Exercise)  1    Duration  Continue with 30 min of aerobic exercise without signs/symptoms of physical distress.    Intensity  THRR unchanged      Progression   Progression  Continue to progress workloads to maintain intensity without  signs/symptoms of physical distress.      Resistance Training   Training Prescription  Yes    Weight  orange bands    Reps  10-15    Time  10 Minutes      Interval Training   Interval Training  No      NuStep   Level  2    SPM  80    Minutes  30       Social History   Tobacco Use  Smoking Status Never Smoker  Smokeless Tobacco Never Used    Goals Met:  Exercise tolerated well No report of cardiac concerns or symptoms Strength training completed today  Goals Unmet:  Not Applicable  Comments: Service time is from 1035  to 1131    Dr. Rush Farmer is Medical Director for Pulmonary Rehab at [facility].

## 2019-06-25 ENCOUNTER — Telehealth: Payer: Self-pay | Admitting: Oncology

## 2019-06-25 NOTE — Progress Notes (Signed)
Pulmonary Individual Treatment Plan  Patient Details  Name: Jeanne [name] MRN: [id number] Date of Birth: [birth year] Referring Provider:     Pulmonary Rehab Walk Test from 05/29/2019 in [place]  Referring Provider  Dr. Woody Seller      Initial Encounter Date:    Pulmonary Rehab Walk Test from 05/29/2019 in [facility]  Date  05/29/19      Visit Diagnosis: Heart failure, diastolic, chronic ([place])  Patient's Home Medications on Admission:   Current Outpatient Medications:  .  albuterol (PROVENTIL HFA;VENTOLIN HFA) 108 (90 Base) MCG/ACT inhaler, Inhale 2 puffs into the lungs every 4 (four) hours as needed for wheezing or shortness of breath. (Patient not taking: Reported on 05/29/2019), Disp: 1 Inhaler, Rfl: 0 .  aspirin EC 81 MG tablet, Take 81 mg by mouth daily.  , Disp: , Rfl:  .  diltiazem (TIAZAC) 240 MG 24 hr capsule, Take 1 capsule (240 mg total) by mouth daily., Disp: 30 capsule, Rfl: 0 .  ezetimibe (ZETIA) 10 MG tablet, Take 10 mg by mouth every evening.  , Disp: , Rfl:  .  furosemide (LASIX) 20 MG tablet, Take 2 tablets (40 mg total) by mouth daily., Disp: 30 tablet, Rfl: 3 .  hydrALAZINE (APRESOLINE) 25 MG tablet, Take 1 tablet (25 mg total) by mouth 3 (three) times daily., Disp: 270 tablet, Rfl: 3 .  insulin glargine (LANTUS) 100 UNIT/ML injection, Inject 65 Units into the skin every morning. , Disp: , Rfl:  .  latanoprost (XALATAN) 0.005 % ophthalmic solution, Place 1 drop into the left eye at bedtime., Disp: , Rfl: 5 .  Menthol, Topical Analgesic, (BIOFREEZE EX), Apply 1 application topically daily as needed (back pain)., Disp: , Rfl:  .  oxybutynin (DITROPAN-XL) 10 MG 24 hr tablet, Take 10 mg by mouth daily.  , Disp: , Rfl:  .  pantoprazole (PROTONIX) 40 MG tablet, Take 40 mg by mouth daily., Disp: , Rfl:  .  Polyvinyl Alcohol-Povidone (REFRESH OP), Place 1 drop into both eyes at bedtime., Disp: , Rfl:  .   pravastatin (PRAVACHOL) 40 MG tablet, Take 40 mg by mouth daily. , Disp: , Rfl:  .  probenecid (BENEMID) 500 MG tablet, Take 500 mg by mouth 2 (two) times daily., Disp: , Rfl: 3  Past Medical History: Past Medical History:  Diagnosis Date  . Anemia   . Arthritis   . Carotid arterial disease ([place])   . Diabetes mellitus   . Hyperlipidemia   . Hypertension   . Migraine   . Obesity   . Reflux   . Sleep apnea     Tobacco Use: Social History   Tobacco Use  Smoking Status Never Smoker  Smokeless Tobacco Never Used    Labs: Recent Review Flowsheet Data    Labs for ITP Cardiac and Pulmonary Rehab Latest Ref Rng & Units 02/02/2013 10/09/2015 10/12/2015 03/31/2019   Cholestrol 0 - 200 mg/dL - - 90 92   LDLCALC 0 - 99 mg/dL - - 35 45   HDL >40 mg/dL - - 28(L) 32(L)   Trlycerides <150 mg/dL - - 134 77   Hemoglobin A1c 4.8 - 5.6 % - 6.6(H) - -   TCO2 0 - 100 mmol/L 32 - - -      Capillary Blood Glucose: Lab Results  Component Value Date   GLUCAP 145 (H) 04/01/2019   GLUCAP 145 (H) 04/01/2019   GLUCAP 95 03/31/2019   GLUCAP 58 (  L) 10/12/2015   GLUCAP 112 (H) 10/12/2015   POCT Glucose    Row Name 06/10/19 1155 06/24/19 1203           POCT Blood Glucose   Pre-Exercise  122 mg/dL  144 mg/dL      Post-Exercise  162 mg/dL  208 mg/dL         Pulmonary Assessment Scores: Pulmonary Assessment Scores    Row Name 05/29/19 1626         ADL UCSD   SOB Score total  80       CAT Score   CAT Score  31       UCSD: Self-administered rating of dyspnea associated with activities of daily living (ADLs) 6-point scale (0 = "not at all" to 5 = "maximal or unable to do because of breathlessness")  Scoring Scores range from 0 to 120.  Minimally important difference is 5 units  CAT: CAT can identify the health impairment of COPD patients and is better correlated with disease progression.  CAT has a scoring range of zero to 40. The CAT score is classified into four groups of low (less  than 10), medium (10 - 20), high (21-30) and very high (31-40) based on the impact level of disease on health status. A CAT score over 10 suggests significant symptoms.  A worsening CAT score could be explained by an exacerbation, poor medication adherence, poor inhaler technique, or progression of COPD or comorbid conditions.  CAT MCID is 2 points  mMRC: mMRC (Modified Medical Research Council) Dyspnea Scale is used to assess the degree of baseline functional disability in patients of respiratory disease due to dyspnea. No minimal important difference is established. A decrease in score of 1 point or greater is considered a positive change.   Pulmonary Function Assessment: Pulmonary Function Assessment - 05/29/19 1626      Breath   Shortness of Breath  Yes;Limiting activity       Exercise Target Goals: Exercise Program Goal: Individual exercise prescription set using results from initial 6 min walk test and THRR while considering  patient's activity barriers and safety.   Exercise Prescription Goal: Initial exercise prescription builds to 30-45 minutes a day of aerobic activity, 2-3 days per week.  Home exercise guidelines will be given to patient during program as part of exercise prescription that the participant will acknowledge.  Activity Barriers & Risk Stratification: Activity Barriers & Cardiac Risk Stratification - 05/29/19 1518      Activity Barriers & Cardiac Risk Stratification   Activity Barriers  Arthritis;Deconditioning;Muscular Weakness;Shortness of Breath;Balance Concerns       6 Minute Walk: 6 Minute Walk    Row Name 05/29/19 1535         6 Minute Walk   Phase  Initial     Distance  460 feet     Walk Time  6 minutes     # of Rest Breaks  2 Stop at minute 1:32, Start back at minute 2:56. Stop at minute 4:23, Start back at minute 5:37.     MPH  0.87     METS  -0.6     RPE  15     Perceived Dyspnea   3     VO2 Peak  -2.16     Resting HR  90 bpm      Resting BP  118/66     Resting Oxygen Saturation   99 %     Exercise Oxygen Saturation  during 6 min  walk  97 %     Max Ex. HR  127 bpm     Max Ex. BP  126/80     2 Minute Post BP  108/64       Interval HR   1 Minute HR  124     2 Minute HR  118     3 Minute HR  107     4 Minute HR  127     5 Minute HR  114     6 Minute HR  113     2 Minute Post HR  81     Interval Heart Rate?  Yes       Interval Oxygen   Interval Oxygen?  Yes     Baseline Oxygen Saturation %  99 %     1 Minute Oxygen Saturation %  95 %     2 Minute Oxygen Saturation %  98 %     3 Minute Oxygen Saturation %  99 %     4 Minute Oxygen Saturation %  97 %     5 Minute Oxygen Saturation %  98 %     6 Minute Oxygen Saturation %  97 %     2 Minute Post Oxygen Saturation %  98 %        Oxygen Initial Assessment: Oxygen Initial Assessment - 05/29/19 1625      Home Oxygen   Sleep Oxygen Prescription  CPAP    Home Exercise Oxygen Prescription  None    Home at Rest Exercise Oxygen Prescription  None      Initial 6 min Walk   Oxygen Used  None      Program Oxygen Prescription   Program Oxygen Prescription  None       Oxygen Re-Evaluation: Oxygen Re-Evaluation    Row Name 06/24/19 0743             Program Oxygen Prescription   Program Oxygen Prescription  None         Home Oxygen   Home Oxygen Device  None       Sleep Oxygen Prescription  CPAP       Home Exercise Oxygen Prescription  None       Home at Rest Exercise Oxygen Prescription  None       Compliance with Home Oxygen Use  Yes         Goals/Expected Outcomes   Short Term Goals  To learn and exhibit compliance with exercise, home and travel O2 prescription;To learn and understand importance of monitoring SPO2 with pulse oximeter and demonstrate accurate use of the pulse oximeter.;To learn and understand importance of maintaining oxygen saturations>88%;To learn and demonstrate proper pursed lip breathing techniques or other breathing  techniques.;To learn and demonstrate proper use of respiratory medications       Long  Term Goals  Exhibits compliance with exercise, home and travel O2 prescription;Verbalizes importance of monitoring SPO2 with pulse oximeter and return demonstration;Maintenance of O2 saturations>88%;Exhibits proper breathing techniques, such as pursed lip breathing or other method taught during program session;Compliance with respiratory medication       Goals/Expected Outcomes  compliance          Oxygen Discharge (Final Oxygen Re-Evaluation): Oxygen Re-Evaluation - 06/24/19 0743      Program Oxygen Prescription   Program Oxygen Prescription  None      Home Oxygen   Home Oxygen Device  None    Sleep Oxygen Prescription  CPAP  Home Exercise Oxygen Prescription  None    Home at Rest Exercise Oxygen Prescription  None    Compliance with Home Oxygen Use  Yes      Goals/Expected Outcomes   Short Term Goals  To learn and exhibit compliance with exercise, home and travel O2 prescription;To learn and understand importance of monitoring SPO2 with pulse oximeter and demonstrate accurate use of the pulse oximeter.;To learn and understand importance of maintaining oxygen saturations>88%;To learn and demonstrate proper pursed lip breathing techniques or other breathing techniques.;To learn and demonstrate proper use of respiratory medications    Long  Term Goals  Exhibits compliance with exercise, home and travel O2 prescription;Verbalizes importance of monitoring SPO2 with pulse oximeter and return demonstration;Maintenance of O2 saturations>88%;Exhibits proper breathing techniques, such as pursed lip breathing or other method taught during program session;Compliance with respiratory medication    Goals/Expected Outcomes  compliance       Initial Exercise Prescription: Initial Exercise Prescription - 05/29/19 1415      Date of Initial Exercise RX and Referring Provider   Date  05/29/19    Referring  Provider  Dr. Woody Seller      NuStep   Level  2    SPM  75    Minutes  15      Prescription Details   Frequency (times per week)  3    Duration  Progress to 30 minutes of continuous aerobic without signs/symptoms of physical distress      Intensity   THRR 40-80% of Max Heartrate  56-111    Ratings of Perceived Exertion  11-13    Perceived Dyspnea  0-4      Progression   Progression  Continue to progress workloads to maintain intensity without signs/symptoms of physical distress.      Resistance Training   Training Prescription  Yes    Weight  --   Orange Bands   Reps  10-15       Perform Capillary Blood Glucose checks as needed.  Exercise Prescription Changes: Exercise Prescription Changes    Row Name 06/10/19 1100 06/24/19 1200           Response to Exercise   Blood Pressure (Admit)  122/60  120/80      Blood Pressure (Exercise)  118/60  120/80      Blood Pressure (Exit)  128/80  118/72      Heart Rate (Admit)  86 bpm  90 bpm      Heart Rate (Exercise)  98 bpm  97 bpm      Heart Rate (Exit)  83 bpm  97 bpm      Oxygen Saturation (Admit)  98 %  98 %      Oxygen Saturation (Exercise)  98 %  99 %      Oxygen Saturation (Exit)  99 %  97 %      Rating of Perceived Exertion (Exercise)  13  11      Perceived Dyspnea (Exercise)  1  1      Duration  Continue with 30 min of aerobic exercise without signs/symptoms of physical distress.  Continue with 30 min of aerobic exercise without signs/symptoms of physical distress.      Intensity  - 40-80% HRR  THRR unchanged        Progression   Progression  Continue to progress workloads to maintain intensity without signs/symptoms of physical distress.  Continue to progress workloads to maintain intensity without signs/symptoms of physical distress.  Resistance Training   Training Prescription  Yes  Yes      Weight  orange bands  orange bands      Reps  10-15  10-15      Time  10 Minutes  10 Minutes        Interval Training    Interval Training  No  No        NuStep   Level  2  2      SPM  75  80      Minutes  15  30      METs  1.4  -         Exercise Comments:   Exercise Goals and Review: Exercise Goals    Row Name 06/24/19 0744             Exercise Goals   Increase Physical Activity  Yes       Intervention  Provide advice, education, support and counseling about physical activity/exercise needs.;Develop an individualized exercise prescription for aerobic and resistive training based on initial evaluation findings, risk stratification, comorbidities and participant's personal goals.       Expected Outcomes  Short Term: Attend rehab on a regular basis to increase amount of physical activity.;Long Term: Add in home exercise to make exercise part of routine and to increase amount of physical activity.;Long Term: Exercising regularly at least 3-5 days a week.       Increase Strength and Stamina  Yes       Intervention  Provide advice, education, support and counseling about physical activity/exercise needs.;Develop an individualized exercise prescription for aerobic and resistive training based on initial evaluation findings, risk stratification, comorbidities and participant's personal goals.       Expected Outcomes  Short Term: Increase workloads from initial exercise prescription for resistance, speed, and METs.;Short Term: Perform resistance training exercises routinely during rehab and add in resistance training at home;Long Term: Improve cardiorespiratory fitness, muscular endurance and strength as measured by increased METs and functional capacity (6MWT)       Able to understand and use rate of perceived exertion (RPE) scale  Yes       Intervention  Provide education and explanation on how to use RPE scale       Expected Outcomes  Short Term: Able to use RPE daily in rehab to express subjective intensity level;Long Term:  Able to use RPE to guide intensity level when exercising independently        Able to understand and use Dyspnea scale  Yes       Intervention  Provide education and explanation on how to use Dyspnea scale       Expected Outcomes  Short Term: Able to use Dyspnea scale daily in rehab to express subjective sense of shortness of breath during exertion;Long Term: Able to use Dyspnea scale to guide intensity level when exercising independently       Knowledge and understanding of Target Heart Rate Range (THRR)  Yes       Expected Outcomes  Short Term: Able to state/look up THRR;Short Term: Able to use daily as guideline for intensity in rehab;Long Term: Able to use THRR to govern intensity when exercising independently       Understanding of Exercise Prescription  Yes       Intervention  Provide education, explanation, and written materials on patient's individual exercise prescription       Expected Outcomes  Short Term: Able to explain program exercise prescription;Long Term:  Able to explain home exercise prescription to exercise independently          Exercise Goals Re-Evaluation : Exercise Goals Re-Evaluation    Row Name 06/24/19 0744             Exercise Goal Re-Evaluation   Exercise Goals Review  Increase Physical Activity;Increase Strength and Stamina;Able to understand and use rate of perceived exertion (RPE) scale;Able to understand and use Dyspnea scale;Knowledge and understanding of Target Heart Rate Range (THRR);Understanding of Exercise Prescription       Comments  Pt has completed 5 exercise sessions. Pt is motivated to lose weight and live a more active lifestyle. She reports that she now does her resistance bands and squats at home. Before the program, she did not engage in any activity. Pt currently exercises at 1.4 METs on the stepper. Will continue to monitor and progress as able.       Expected Outcomes  Through exercise at rehab and at home, the patient will decrease shortness of breath with daily activities and feel confident in carrying out an exercise  regime at home.          Discharge Exercise Prescription (Final Exercise Prescription Changes): Exercise Prescription Changes - 06/24/19 1200      Response to Exercise   Blood Pressure (Admit)  120/80    Blood Pressure (Exercise)  120/80    Blood Pressure (Exit)  118/72    Heart Rate (Admit)  90 bpm    Heart Rate (Exercise)  97 bpm    Heart Rate (Exit)  97 bpm    Oxygen Saturation (Admit)  98 %    Oxygen Saturation (Exercise)  99 %    Oxygen Saturation (Exit)  97 %    Rating of Perceived Exertion (Exercise)  11    Perceived Dyspnea (Exercise)  1    Duration  Continue with 30 min of aerobic exercise without signs/symptoms of physical distress.    Intensity  THRR unchanged      Progression   Progression  Continue to progress workloads to maintain intensity without signs/symptoms of physical distress.      Resistance Training   Training Prescription  Yes    Weight  orange bands    Reps  10-15    Time  10 Minutes      Interval Training   Interval Training  No      NuStep   Level  2    SPM  80    Minutes  30       Nutrition:  Target Goals: Understanding of nutrition guidelines, daily intake of sodium <1557m, cholesterol <2056m calories 30% from fat and 7% or less from saturated fats, daily to have 5 or more servings of fruits and vegetables.  Biometrics: Pre Biometrics - 05/29/19 1538      Pre Biometrics   Height  '5\' 3"'  (1.6 m)    Weight  (!) 331 lb 2.1 oz (150.2 kg)    BMI (Calculated)  58.67        Nutrition Therapy Plan and Nutrition Goals:   Nutrition Assessments:   Nutrition Goals Re-Evaluation:   Nutrition Goals Discharge (Final Nutrition Goals Re-Evaluation):   Psychosocial: Target Goals: Acknowledge presence or absence of significant depression and/or stress, maximize coping skills, provide positive support system. Participant is able to verbalize types and ability to use techniques and skills needed for reducing stress and  depression.  Initial Review & Psychosocial Screening: Initial Psych Review & Screening - 05/29/19 1630  Initial Review   Current issues with  None Identified      Family Dynamics   Good Support System?  Yes      Barriers   Psychosocial barriers to participate in program  There are no identifiable barriers or psychosocial needs.      Screening Interventions   Interventions  Encouraged to exercise       Quality of Life Scores:  Scores of 19 and below usually indicate a poorer quality of life in these areas.  A difference of  2-3 points is a clinically meaningful difference.  A difference of 2-3 points in the total score of the Quality of Life Index has been associated with significant improvement in overall quality of life, self-image, physical symptoms, and general health in studies assessing change in quality of life.  PHQ-9: Recent Review Flowsheet Data    Depression screen [facility] 2/9 05/29/2019   Decreased Interest 0   Down, Depressed, Hopeless 0   PHQ - 2 Score 0   Altered sleeping 0   Tired, decreased energy 0   Change in appetite 0   Feeling bad or failure about yourself  0   Trouble concentrating 0   Moving slowly or fidgety/restless 0   Suicidal thoughts 0   PHQ-9 Score 0   Difficult doing work/chores Not difficult at all     Interpretation of Total Score  Total Score Depression Severity:  1-4 = Minimal depression, 5-9 = Mild depression, 10-14 = Moderate depression, 15-19 = Moderately severe depression, 20-27 = Severe depression   Psychosocial Evaluation and Intervention: Psychosocial Evaluation - 05/29/19 1630      Psychosocial Evaluation & Interventions   Interventions  Encouraged to exercise with the program and follow exercise prescription    Comments  No barriers or psychosocial concerns identified    Expected Outcomes  No barriers to participation in pulmonary rehab.    Continue Psychosocial Services   No Follow up required       Psychosocial  Re-Evaluation: Psychosocial Re-Evaluation    [place] Name 06/25/19 1106             Psychosocial Re-Evaluation   Current issues with  None Identified       Expected Outcomes  will continue to have no barriers or psychosocial concerns while in pulmonary rehab       Interventions  Encouraged to attend Pulmonary Rehabilitation for the exercise       Continue Psychosocial Services   No Follow up required          Psychosocial Discharge (Final Psychosocial Re-Evaluation): Psychosocial Re-Evaluation - 06/25/19 1106      Psychosocial Re-Evaluation   Current issues with  None Identified    Expected Outcomes  will continue to have no barriers or psychosocial concerns while in pulmonary rehab    Interventions  Encouraged to attend Pulmonary Rehabilitation for the exercise    Continue Psychosocial Services   No Follow up required       Education: Education Goals: Education classes will be provided on a weekly basis, covering required topics. Participant will state understanding/return demonstration of topics presented.  Learning Barriers/Preferences:   Education Topics: Risk Factor Reduction:  -Group instruction that is supported by a PowerPoint presentation. Instructor discusses the definition of a risk factor, different risk factors for pulmonary disease, and how the heart and lungs work together.     Nutrition for Pulmonary Patient:  -Group instruction provided by PowerPoint slides, verbal discussion, and written materials to support subject  matter. The instructor gives an explanation and review of healthy diet recommendations, which includes a discussion on weight management, recommendations for fruit and vegetable consumption, as well as protein, fluid, caffeine, fiber, sodium, sugar, and alcohol. Tips for eating when patients are short of breath are discussed.   Pursed Lip Breathing:  -Group instruction that is supported by demonstration and informational handouts. Instructor  discusses the benefits of pursed lip and diaphragmatic breathing and detailed demonstration on how to preform both.     Oxygen Safety:  -Group instruction provided by PowerPoint, verbal discussion, and written material to support subject matter. There is an overview of "What is Oxygen" and "Why do we need it".  Instructor also reviews how to create a safe environment for oxygen use, the importance of using oxygen as prescribed, and the risks of noncompliance. There is a brief discussion on traveling with oxygen and resources the patient may utilize.   Oxygen Equipment:  -Group instruction provided by [facility] Staff utilizing handouts, written materials, and equipment demonstrations.   Signs and Symptoms:  -Group instruction provided by written material and verbal discussion to support subject matter. Warning signs and symptoms of infection, stroke, and heart attack are reviewed and when to call the physician/911 reinforced. Tips for preventing the spread of infection discussed.   Advanced Directives:  -Group instruction provided by verbal instruction and written material to support subject matter. Instructor reviews Advanced Directive laws and proper instruction for filling out document.   Pulmonary Video:  -Group video education that reviews the importance of medication and oxygen compliance, exercise, good nutrition, pulmonary hygiene, and pursed lip and diaphragmatic breathing for the pulmonary patient.   Exercise for the Pulmonary Patient:  -Group instruction that is supported by a PowerPoint presentation. Instructor discusses benefits of exercise, core components of exercise, frequency, duration, and intensity of an exercise routine, importance of utilizing pulse oximetry during exercise, safety while exercising, and options of places to exercise outside of rehab.     Pulmonary Medications:  -Verbally interactive group education provided by instructor with focus on inhaled  medications and proper administration.   Anatomy and Physiology of the Respiratory System and Intimacy:  -Group instruction provided by PowerPoint, verbal discussion, and written material to support subject matter. Instructor reviews respiratory cycle and anatomical components of the respiratory system and their functions. Instructor also reviews differences in obstructive and restrictive respiratory diseases with examples of each. Intimacy, Sex, and Sexuality differences are reviewed with a discussion on how relationships can change when diagnosed with pulmonary disease. Common sexual concerns are reviewed.   MD DAY -A group question and answer session with a medical doctor that allows participants to ask questions that relate to their pulmonary disease state.   OTHER EDUCATION -Group or individual verbal, written, or video instructions that support the educational goals of the pulmonary rehab program.   Holiday Eating Survival Tips:  -Group instruction provided by PowerPoint slides, verbal discussion, and written materials to support subject matter. The instructor gives patients tips, tricks, and techniques to help them not only survive but enjoy the holidays despite the onslaught of food that accompanies the holidays.   Knowledge Questionnaire Score: Knowledge Questionnaire Score - 05/29/19 1627      Knowledge Questionnaire Score   Pre Score  14/18       Core Components/Risk Factors/Patient Goals at Admission: Personal Goals and Risk Factors at Admission - 05/29/19 1627      Core Components/Risk Factors/Patient Goals on Admission    Weight Management  Obesity    Improve shortness of breath with ADL's  Yes    Intervention  Provide education, individualized exercise plan and daily activity instruction to help decrease symptoms of SOB with activities of daily living.    Expected Outcomes  Short Term: Improve cardiorespiratory fitness to achieve a reduction of symptoms when  performing ADLs;Long Term: Be able to perform more ADLs without symptoms or delay the onset of symptoms       Core Components/Risk Factors/Patient Goals Review:  Goals and Risk Factor Review    Row Name 05/29/19 1629 06/25/19 1107           Core Components/Risk Factors/Patient Goals Review   Personal Goals Review  Weight Management/Obesity;Increase knowledge of respiratory medications and ability to use respiratory devices properly.;Improve shortness of breath with ADL's;Develop more efficient breathing techniques such as purse lipped breathing and diaphragmatic breathing and practicing self-pacing with activity.  Weight Management/Obesity;Heart Failure;Improve shortness of breath with ADL's;Develop more efficient breathing techniques such as purse lipped breathing and diaphragmatic breathing and practicing self-pacing with activity.;Diabetes      Review  -  Just started program ,as attended 6 exercise sessions, has lost 1.3 kg, attendance good, on level 2 of nustep, has a good attitude despite her weight and orthopedic pain in knees      Expected Outcomes  -  See admission goals         Core Components/Risk Factors/Patient Goals at Discharge (Final Review):  Goals and Risk Factor Review - 06/25/19 1107      Core Components/Risk Factors/Patient Goals Review   Personal Goals Review  Weight Management/Obesity;Heart Failure;Improve shortness of breath with ADL's;Develop more efficient breathing techniques such as purse lipped breathing and diaphragmatic breathing and practicing self-pacing with activity.;Diabetes    Review  Just started program ,as attended 6 exercise sessions, has lost 1.3 kg, attendance good, on level 2 of nustep, has a good attitude despite her weight and orthopedic pain in knees    Expected Outcomes  See admission goals       ITP Comments:   Comments: ITP REVIEW Pt is making expected progress toward pulmonary rehab goals after completing 6 sessions. Recommend  continued exercise, life style modification, education, and utilization of breathing techniques to increase stamina and strength and decrease shortness of breath with exertion.

## 2019-06-25 NOTE — Telephone Encounter (Signed)
Ms. Holzheimer returned my call to schedule a hem appt for elevated free light chains. She has been scheduled to see Dr. Alen Blew on 11/11 at [facility]. Ms. Khokhar is aware to arrive 20 minutes early. Letter mailed.

## 2019-06-26 ENCOUNTER — Other Ambulatory Visit: Payer: Self-pay

## 2019-06-26 ENCOUNTER — Encounter (HOSPITAL_COMMUNITY): Payer: Medicare Other

## 2019-06-26 ENCOUNTER — Encounter (HOSPITAL_COMMUNITY)
Admission: RE | Admit: 2019-06-26 | Discharge: 2019-06-26 | Disposition: A | Discharge status: Home / Self Care | Payer: Medicare Other | Source: Ambulatory Visit | Attending: Pulmonary Disease | Admitting: Pulmonary Disease

## 2019-06-26 DIAGNOSIS — I5032 Chronic diastolic (congestive) heart failure: Secondary | ICD-10-CM

## 2019-06-26 NOTE — Progress Notes (Signed)
Nutrition Note  Spoke with pt today about diabetes management. Assessed pt daily food and activity intake. Pt reports sitting most of the day and eats breakfast and dinner. She occasionally snacks on candy but has been limiting it since started rehab. Reviewed consistent carb intake as pt is on insulin. Encouraged small lunch with minimum 1-2 carb servings paired with protein. Pt did not report any lows.  Reviewed A1C and what that number means as well as target of <7%. Reviewed target fasting blood sugars and 2 hour post prandial. Provided education on macronutrients and carbohydrates impact on blood sugar. Discussed fiber rich carb choices and benefit of fiber on blood sugars.  Discussed the importance of creating a balanced meal with the addition of protein and non-starchy vegetables. Additionally discussed with patient that exercise may cause blood sugar to decrease and that we may need to add in a snack before or after workout, to manage any changes.Provided suggestions for meals and snacks. Pt verbalized understanding of material discussed today. Distributed RD contact information.    Jeanne Offer, MS, RDN, LDN

## 2019-06-26 NOTE — Progress Notes (Signed)
Daily Session Note  Patient Details  Name: Jeanne [name] MRN: [id number] Date of Birth: 04/15/1936 Referring Provider:     Pulmonary Rehab Walk Test from 05/29/2019 in [facility]  Referring Provider  Dr. Vyas      Encounter Date: 06/26/2019  Check In: Session Check In - 06/26/19 1205      Check-In   Supervising physician immediately available to respond to emergencies  [facility] immediately available    Physician(s)  Dr. Powell    Location  [facility]    Staff Present  Joan Behrens, RN, BSN;Carlette Carlton, RN, BSN; , MS, Exercise Physiologist;Lisa Hughes, RN    Virtual Visit  No    Medication changes reported      No    Fall or balance concerns reported     No    Tobacco Cessation  No Change    Warm-up and Cool-down  Performed as group-led instruction    Resistance Training Performed  Yes    VAD Patient?  No    PAD/SET Patient?  No      Pain Assessment   Currently in Pain?  No/denies    Multiple Pain Sites  No       Capillary Blood Glucose: No results found for this or any previous visit (from the past 24 hour(s)).    Social History   Tobacco Use  Smoking Status Never Smoker  Smokeless Tobacco Never Used    Goals Met:  Independence with exercise equipment Exercise tolerated well Strength training completed today  Goals Unmet:  Not Applicable  Comments: Service time is from 1020 to 1127    Dr. Wesam G. Yacoub is Medical Director for Pulmonary Rehab at [facility]. 

## 2019-07-01 ENCOUNTER — Encounter (HOSPITAL_COMMUNITY): Payer: Medicare Other

## 2019-07-01 ENCOUNTER — Other Ambulatory Visit: Payer: Self-pay | Admitting: Cardiology

## 2019-07-01 ENCOUNTER — Encounter (HOSPITAL_COMMUNITY)
Admission: RE | Admit: 2019-07-01 | Discharge: 2019-07-01 | Disposition: A | Discharge status: Home / Self Care | Payer: Medicare Other | Source: Ambulatory Visit | Attending: Pulmonary Disease | Admitting: Pulmonary Disease

## 2019-07-01 ENCOUNTER — Other Ambulatory Visit: Payer: Self-pay

## 2019-07-01 DIAGNOSIS — I519 Heart disease, unspecified: Secondary | ICD-10-CM

## 2019-07-01 DIAGNOSIS — I5032 Chronic diastolic (congestive) heart failure: Secondary | ICD-10-CM | POA: Diagnosis not present

## 2019-07-01 NOTE — Progress Notes (Signed)
I have reviewed a Home Exercise Prescription with Jeanne [name] . Jeanne [name] is currently exercising at home.  The patient was advised to walk, do squats, and use resistance bands 2 days a week for 30-45 minutes.  Jeanne [name] and I discussed how to progress their exercise prescription.  The patient stated that their goals were to lose weight and to be able to walk without her cane.  The patient stated that they understand the exercise prescription.  We reviewed exercise guidelines, target heart rate during exercise, RPE Scale, weather conditions, NTG use, endpoints for exercise, warmup and cool down.  Patient is encouraged to come to me with any questions. I will continue to follow up with the patient to assist them with progression and safety.    Hoy Register, M.S., ACSM CEP

## 2019-07-01 NOTE — Progress Notes (Signed)
Daily Session Note  Patient Details  Name: Jeanne [name] MRN: [id number] Date of Birth: 27-Jul-1936 Referring Provider:     Pulmonary Rehab Walk Test from 05/29/2019 in [place]  Referring Provider  Dr. Woody Seller      Encounter Date: 07/01/2019  Check In: Session Check In - 07/01/19 1107      Check-In   Supervising physician immediately available to respond to emergencies  [facility] immediately available    Physician(s)  Dr. Florene Glen    Location  [facility]    Staff Present  Rosebud Poles, RN, Bjorn Loser, MS, Exercise Physiologist;Lisa Ysidro Evert, RN    Virtual Visit  No    Medication changes reported      No    Fall or balance concerns reported     No    Tobacco Cessation  No Change    Warm-up and Cool-down  Performed on first and last piece of equipment    Resistance Training Performed  Yes    VAD Patient?  No    PAD/SET Patient?  No      Pain Assessment   Currently in Pain?  No/denies    Multiple Pain Sites  No       Capillary Blood Glucose: No results found for this or any previous visit (from the past 24 hour(s)).    Social History   Tobacco Use  Smoking Status Never Smoker  Smokeless Tobacco Never Used    Goals Met:  Proper associated with RPD/PD & O2 Sat Exercise tolerated well Strength training completed today  Goals Unmet:  Not Applicable  Comments: Service time is from [place] to [place]    Dr. Rush Farmer is Medical Director for Pulmonary Rehab at [facility].

## 2019-07-02 ENCOUNTER — Other Ambulatory Visit: Payer: Self-pay

## 2019-07-02 DIAGNOSIS — Z20822 Contact with and (suspected) exposure to covid-19: Secondary | ICD-10-CM

## 2019-07-03 ENCOUNTER — Encounter (HOSPITAL_COMMUNITY): Payer: Medicare Other

## 2019-07-03 LAB — NOVEL CORONAVIRUS, NAA: SARS-CoV-2, NAA: NOT DETECTED

## 2019-07-08 ENCOUNTER — Encounter (HOSPITAL_COMMUNITY)
Admission: RE | Admit: 2019-07-08 | Discharge: 2019-07-08 | Disposition: A | Discharge status: Home / Self Care | Payer: Medicare Other | Source: Ambulatory Visit | Attending: Pulmonary Disease | Admitting: Pulmonary Disease

## 2019-07-08 ENCOUNTER — Other Ambulatory Visit: Payer: Self-pay

## 2019-07-08 ENCOUNTER — Encounter (HOSPITAL_COMMUNITY): Payer: Medicare Other

## 2019-07-08 VITALS — Wt 325.0 lb

## 2019-07-08 DIAGNOSIS — I5032 Chronic diastolic (congestive) heart failure: Secondary | ICD-10-CM | POA: Diagnosis not present

## 2019-07-08 NOTE — Progress Notes (Signed)
Daily Session Note  Patient Details  Name: Jeanne [name] MRN: [id number] Date of Birth: 10/30/1935 Referring Provider:     Pulmonary Rehab Walk Test from 05/29/2019 in [place]  Referring Provider  Dr. Woody Seller      Encounter Date: 07/08/2019  Check In: Session Check In - 07/08/19 1138      Check-In   Supervising physician immediately available to respond to emergencies  [facility] immediately available    Physician(s)  Dr. Fonnie Jarvis    Location  [facility]    Staff Present  Rosebud Poles, RN, Bjorn Loser, MS, Exercise Physiologist;Lisa Ysidro Evert, RN    Virtual Visit  No    Medication changes reported      No    Fall or balance concerns reported     No    Tobacco Cessation  No Change    Warm-up and Cool-down  Performed as group-led instruction    Resistance Training Performed  Yes    VAD Patient?  No    PAD/SET Patient?  No      Pain Assessment   Currently in Pain?  No/denies    Multiple Pain Sites  No       Capillary Blood Glucose: No results found for this or any previous visit (from the past 24 hour(s)). POCT Glucose - 07/08/19 1140      POCT Blood Glucose   Pre-Exercise  112 mg/dL    Post-Exercise  179 mg/dL      Exercise Prescription Changes - 07/08/19 1100      Response to Exercise   Blood Pressure (Admit)  102/64    Blood Pressure (Exercise)  120/70    Blood Pressure (Exit)  110/70    Heart Rate (Admit)  92 bpm    Heart Rate (Exercise)  105 bpm    Heart Rate (Exit)  100 bpm    Oxygen Saturation (Admit)  99 %    Oxygen Saturation (Exercise)  99 %    Oxygen Saturation (Exit)  93 %    Rating of Perceived Exertion (Exercise)  0    Perceived Dyspnea (Exercise)  1    Duration  Continue with 30 min of aerobic exercise without signs/symptoms of physical distress.    Intensity  THRR unchanged      Progression   Progression  Continue to progress workloads to maintain intensity without signs/symptoms  of physical distress.      Resistance Training   Training Prescription  Yes    Weight  orange bands    Reps  10-15    Time  10 Minutes      Interval Training   Interval Training  No      NuStep   Level  3    SPM  80    Minutes  30    METs  1.5       Social History   Tobacco Use  Smoking Status Never Smoker  Smokeless Tobacco Never Used    Goals Met:  Proper associated with RPD/PD & O2 Sat Exercise tolerated well Strength training completed today  Goals Unmet:  Not Applicable  Comments: Service time is from 1020 to 1130    Dr. Rush Farmer is Medical Director for Pulmonary Rehab at [facility].

## 2019-07-10 ENCOUNTER — Encounter (HOSPITAL_COMMUNITY): Payer: Medicare Other

## 2019-07-10 ENCOUNTER — Encounter (HOSPITAL_COMMUNITY)
Admission: RE | Admit: 2019-07-10 | Discharge: 2019-07-10 | Disposition: A | Discharge status: Home / Self Care | Payer: Medicare Other | Source: Ambulatory Visit | Attending: Pulmonary Disease | Admitting: Pulmonary Disease

## 2019-07-10 ENCOUNTER — Other Ambulatory Visit: Payer: Self-pay

## 2019-07-10 DIAGNOSIS — I5032 Chronic diastolic (congestive) heart failure: Secondary | ICD-10-CM

## 2019-07-10 NOTE — Progress Notes (Signed)
Daily Session Note  Patient Details  Name: Jeanne [name] MRN: [id number] Date of Birth: [birth year] Referring Provider:     Pulmonary Rehab Walk Test from 05/29/2019 in [place]  Referring Provider  Dr. Woody Seller      Encounter Date: 07/10/2019  Check In: Session Check In - 07/10/19 1108      Check-In   Supervising physician immediately available to respond to emergencies  [facility] immediately available    Physician(s)  Dr. Pricilla Loveless    Location  [facility]    Staff Present  Rosebud Poles, RN, Bjorn Loser, MS, Exercise Physiologist;Lisa Ysidro Evert, RN    Virtual Visit  No    Medication changes reported      No    Fall or balance concerns reported     No    Tobacco Cessation  No Change    Warm-up and Cool-down  Performed as group-led instruction    Resistance Training Performed  Yes    VAD Patient?  No    PAD/SET Patient?  No      Pain Assessment   Currently in Pain?  No/denies    Multiple Pain Sites  No       Capillary Blood Glucose: No results found for this or any previous visit (from the past 24 hour(s)).    Social History   Tobacco Use  Smoking Status Never Smoker  Smokeless Tobacco Never Used    Goals Met:  Proper associated with RPD/PD & O2 Sat Exercise tolerated well Strength training completed today  Goals Unmet:  Not Applicable  Comments: Service time is from 1025  to [place]    Dr. Rush Farmer is Medical Director for Pulmonary Rehab at [facility].

## 2019-07-15 ENCOUNTER — Encounter (HOSPITAL_COMMUNITY)
Admission: RE | Admit: 2019-07-15 | Discharge: 2019-07-15 | Disposition: A | Discharge status: Home / Self Care | Payer: Medicare Other | Source: Ambulatory Visit | Attending: Pulmonary Disease | Admitting: Pulmonary Disease

## 2019-07-15 ENCOUNTER — Encounter (HOSPITAL_COMMUNITY): Payer: Medicare Other

## 2019-07-15 ENCOUNTER — Other Ambulatory Visit: Payer: Self-pay

## 2019-07-15 DIAGNOSIS — I5032 Chronic diastolic (congestive) heart failure: Secondary | ICD-10-CM | POA: Diagnosis not present

## 2019-07-15 NOTE — Progress Notes (Signed)
Daily Session Note  Patient Details  Name: Jeanne [name] MRN: [id number] Date of Birth: 13-Jun-1936 Referring Provider:     Pulmonary Rehab Walk Test from 05/29/2019 in [place]  Referring Provider  Dr. Woody Seller      Encounter Date: 07/15/2019  Check In: Session Check In - 07/15/19 1140      Check-In   Supervising physician immediately available to respond to emergencies  [facility] immediately available    Physician(s)  Dr. Tawanna Solo    Location  [facility]    Staff Present  Rosebud Poles, RN, Bjorn Loser, MS, Exercise Physiologist;Lisa Ysidro Evert, RN    Virtual Visit  No    Medication changes reported      No    Fall or balance concerns reported     No    Tobacco Cessation  No Change    Warm-up and Cool-down  Performed as group-led instruction    Resistance Training Performed  Yes    VAD Patient?  No    PAD/SET Patient?  No      Pain Assessment   Currently in Pain?  No/denies    Multiple Pain Sites  No       Capillary Blood Glucose: No results found for this or any previous visit (from the past 24 hour(s)).    Social History   Tobacco Use  Smoking Status Never Smoker  Smokeless Tobacco Never Used    Goals Met:  Independence with exercise equipment Exercise tolerated well Strength training completed today  Goals Unmet:  Not Applicable  Comments: Service time is from 1035 to 1130    Dr. Rush Farmer is Medical Director for Pulmonary Rehab at [facility].

## 2019-07-16 ENCOUNTER — Other Ambulatory Visit: Payer: Self-pay

## 2019-07-16 ENCOUNTER — Inpatient Hospital Stay: Payer: Medicare Other | Attending: Oncology | Admitting: Oncology

## 2019-07-16 ENCOUNTER — Other Ambulatory Visit: Payer: Self-pay | Admitting: Oncology

## 2019-07-16 VITALS — BP 134/65 | HR 87 | Temp 98.5°F | Resp 18 | Ht 63.0 in | Wt 323.1 lb

## 2019-07-16 DIAGNOSIS — N189 Chronic kidney disease, unspecified: Secondary | ICD-10-CM | POA: Diagnosis not present

## 2019-07-16 DIAGNOSIS — R779 Abnormality of plasma protein, unspecified: Secondary | ICD-10-CM | POA: Diagnosis not present

## 2019-07-16 DIAGNOSIS — E785 Hyperlipidemia, unspecified: Secondary | ICD-10-CM

## 2019-07-16 DIAGNOSIS — D631 Anemia in chronic kidney disease: Secondary | ICD-10-CM | POA: Diagnosis not present

## 2019-07-16 DIAGNOSIS — I129 Hypertensive chronic kidney disease with stage 1 through stage 4 chronic kidney disease, or unspecified chronic kidney disease: Secondary | ICD-10-CM | POA: Diagnosis not present

## 2019-07-16 DIAGNOSIS — D472 Monoclonal gammopathy: Secondary | ICD-10-CM

## 2019-07-16 NOTE — Progress Notes (Signed)
Reason for the request:   Evaluation for plasma cell disorder.  HPI: I was asked by Dr. Carolin Sicks to evaluate Jeanne [name] for abnormal light chains detected on laboratory testing in October 2020.  She is an 83 year old woman with history of hypertension, hyperlipidemia as well as chronic renal insufficiency.  She was hospitalized back in July 2020 for chest pain and shortness of breath and found to have worsening renal function with a creatinine of 2.15 and a creatinine clearance around 25 cc/min.  She did have a laboratory testing by Dr. Carolin Sicks and showed an elevated free kappa light chain of 68.7 with free lambda chain at 22.2 which was normal.  The kappa to lambda ratio was elevated at 3.09.  Her total serum protein was normal with a serum protein electrophoresis showed a 0.6 g/dL.  Her CBC showed a white cell count of 6.3, hemoglobin of 10.2 and a platelet count of 270.  Her creatinine was 2.01, calcium was normal at 9.4 albumin of 3.7.  Iron studies showed iron level of 555 with iron saturation of 18% and ferritin of 17.  Clinically, she is asymptomatic at this time.  She denies any recent bone pain or pathological fractures.  She denies any peripheral neuropathy or recurrent infections.  She ambulates with the help of a cane without any recent falls or syncope.  She continues to live independently although daughters live with her.    She does not report any headaches, blurry vision, syncope or seizures. Does not report any fevers, chills or sweats.  Does not report any cough, wheezing or hemoptysis.  Does not report any chest pain, palpitation, orthopnea or leg edema.  Does not report any nausea, vomiting or abdominal pain.  Does not report any constipation or diarrhea.  Does not report any skeletal complaints.    Does not report frequency, urgency or hematuria.  Does not report any skin rashes or lesions. Does not report any heat or cold intolerance.  Does not report any lymphadenopathy or  petechiae.  Does not report any anxiety or depression.  Remaining review of systems is negative.    Past Medical History:  Diagnosis Date  . Anemia   . Arthritis   . Carotid arterial disease ([place])   . Diabetes mellitus   . Hyperlipidemia   . Hypertension   . Migraine   . Obesity   . Reflux   . Sleep apnea   :  Past Surgical History:  Procedure Laterality Date  . CARDIAC CATHETERIZATION N/A 10/12/2015   Procedure: Left Heart Cath and Coronary Angiography;  Surgeon: Belva Crome, MD;  Location: [facility];  Service: Cardiovascular;  Laterality: N/A;  . CHOLECYSTECTOMY  50 yrs ago  . JOINT REPLACEMENT Right 2005   knee  . WISDOM TOOTH EXTRACTION    :   Current Outpatient Medications:  .  albuterol (PROVENTIL HFA;VENTOLIN HFA) 108 (90 Base) MCG/ACT inhaler, Inhale 2 puffs into the lungs every 4 (four) hours as needed for wheezing or shortness of breath. (Patient not taking: Reported on 05/29/2019), Disp: 1 Inhaler, Rfl: 0 .  aspirin EC 81 MG tablet, Take 81 mg by mouth daily.  , Disp: , Rfl:  .  diltiazem (TIAZAC) 240 MG 24 hr capsule, Take 1 capsule (240 mg total) by mouth daily., Disp: 30 capsule, Rfl: 0 .  ezetimibe (ZETIA) 10 MG tablet, Take 10 mg by mouth every evening.  , Disp: , Rfl:  .  furosemide (LASIX) 20 MG tablet, TAKE 1  TABLET BY MOUTH EVERY DAY, Disp: 90 tablet, Rfl: 1 .  hydrALAZINE (APRESOLINE) 25 MG tablet, Take 1 tablet (25 mg total) by mouth 3 (three) times daily., Disp: 270 tablet, Rfl: 3 .  insulin glargine (LANTUS) 100 UNIT/ML injection, Inject 65 Units into the skin every morning. , Disp: , Rfl:  .  latanoprost (XALATAN) 0.005 % ophthalmic solution, Place 1 drop into the left eye at bedtime., Disp: , Rfl: 5 .  Menthol, Topical Analgesic, (BIOFREEZE EX), Apply 1 application topically daily as needed (back pain)., Disp: , Rfl:  .  oxybutynin (DITROPAN-XL) 10 MG 24 hr tablet, Take 10 mg by mouth daily.  , Disp: , Rfl:  .  pantoprazole (PROTONIX) 40 MG  tablet, Take 40 mg by mouth daily., Disp: , Rfl:  .  Polyvinyl Alcohol-Povidone (REFRESH OP), Place 1 drop into both eyes at bedtime., Disp: , Rfl:  .  pravastatin (PRAVACHOL) 40 MG tablet, Take 40 mg by mouth daily. , Disp: , Rfl:  .  probenecid (BENEMID) 500 MG tablet, Take 500 mg by mouth 2 (two) times daily., Disp: , Rfl: 3:  Allergies  Allergen Reactions  . Amoxicillin Nausea Only    Has patient had a PCN reaction causing immediate rash, facial/tongue/throat swelling, SOB or lightheadedness with hypotension: yes, i was dizzy Has patient had a PCN reaction causing severe rash involving mucus membranes or skin necrosis: no Did a PCN reaction that required hospitalization : no, called the DR. Did PCN reaction occurring within the last 10 years: unknown- pt cant recall exactly how long ago it was. if all of the above answers are "NO", then may proceed with Cephalosporin use.   [name] [Canagliflozin] Nausea Only and Other (See Comments)    Dizziness   :  Family History  Problem Relation Age of Onset  . Heart attack Father   . Heart attack Mother   :  Social History   Socioeconomic History  . Marital status: Widowed    Spouse name: Not on file  . Number of children: 2  . Years of education: Not on file  . Highest education level: Not on file  Occupational History  . Not on file  Social Needs  . Financial resource strain: Not on file  . Food insecurity    Worry: Not on file    Inability: Not on file  . Transportation needs    Medical: Not on file    Non-medical: Not on file  Tobacco Use  . Smoking status: Never Smoker  . Smokeless tobacco: Never Used  Substance and Sexual Activity  . Alcohol use: Never    Frequency: Never  . Drug use: No  . Sexual activity: Not on file  Lifestyle  . Physical activity    Days per week: Not on file    Minutes per session: Not on file  . Stress: Not on file  Relationships  . Social Herbalist on phone: Not on file     Gets together: Not on file    Attends religious service: Not on file    Active member of club or organization: Not on file    Attends meetings of clubs or organizations: Not on file    Relationship status: Not on file  . Intimate partner violence    Fear of current or ex partner: Not on file    Emotionally abused: Not on file    Physically abused: Not on file    Forced sexual activity: Not  on file  Other Topics Concern  . Not on file  Social History Narrative  . Not on file  :  Pertinent items are noted in HPI.  Exam: Blood pressure 134/65, pulse 87, temperature 98.5 F (36.9 C), temperature source Temporal, resp. rate 18, height _0  (1.6 m), weight (!) 323 lb 1.6 oz (146.6 kg), SpO2 99 %.  ECOG 1  General appearance: alert and cooperative appeared without distress. Head: atraumatic without any abnormalities. Eyes: conjunctivae/corneas clear. PERRL.  Sclera anicteric. Throat: lips, mucosa, and tongue normal; without oral thrush or ulcers. Resp: clear to auscultation bilaterally without rhonchi, wheezes or dullness to percussion. Cardio: regular rate and rhythm, S1, S2 normal, no murmur, click, rub or gallop GI: soft, non-tender; bowel sounds normal; no masses,  no organomegaly Skin: Skin color, texture, turgor normal. No rashes or lesions Lymph nodes: Cervical, supraclavicular, and axillary nodes normal. Neurologic: Grossly normal without any motor, sensory or deep tendon reflexes. Musculoskeletal: No joint deformity or effusion.    Assessment and Plan:    83 year old with:  1.  Abnormal serum protein electrophoresis with M spike of 0.6 g/dL and immunofixation showed a biclonal monoclonal protein.  She has also mild elevation in her serum light chains including free kappa light chain of 68 and ratio of 3.09 kappa to lambda.  His IgG level is normal with elevated IgA level of 968.  Immunofixation showed biclonal IgA protein with kappa specificity.   Differential  diagnosis was reviewed today with the patient's.  These findings likely represents monoclonal gammopathy of undetermined significance rather than multiple myeloma.  Given the mild elevation in her M spike as well as serum light chains, I do not believe these findings are indicative of symptomatic multiple myeloma.  Skeletal survey would be needed to complete the work-up and potentially a bone marrow biopsy may be required to complete the full evaluation.  I see no evidence to suggest endorgan damage at this time.  Her anemia and renal insufficiency are unrelated to plasma cell disorder.  I have recommended obtaining a skeletal survey and monitoring her laboratory testing repeated in 6 months.  If any dramatic changes noted at that time, then a bone marrow biopsy would be required.  2.  Renal insufficiency: I do not believe this is related to plasma cell disorder although work-up is currently ongoing by nephrology.  3.  Anemia: Related to her renal insufficiency rather than a plasma cell disorder.  4.  Follow-up: Will be in 6 months for repeat laboratory testing.  Cysts   40  minutes was spent with the patient face-to-face today.  More than 50% of time was spent on reviewing laboratory data, discussing differential diagnosis and management options.    Thank you for the referral.  A copy of this consult has been forwarded to the requesting physician.

## 2019-07-17 ENCOUNTER — Encounter (HOSPITAL_COMMUNITY): Payer: Medicare Other

## 2019-07-17 ENCOUNTER — Encounter (HOSPITAL_COMMUNITY)
Admission: RE | Admit: 2019-07-17 | Discharge: 2019-07-17 | Disposition: A | Discharge status: Home / Self Care | Payer: Medicare Other | Source: Ambulatory Visit | Attending: Pulmonary Disease | Admitting: Pulmonary Disease

## 2019-07-17 ENCOUNTER — Telehealth: Payer: Self-pay | Admitting: Oncology

## 2019-07-17 DIAGNOSIS — I5032 Chronic diastolic (congestive) heart failure: Secondary | ICD-10-CM

## 2019-07-17 NOTE — Telephone Encounter (Signed)
Scheduled appt per 11/11 los.  Spoke with pt daughter and she is aware of the appt date and time.

## 2019-07-17 NOTE — Progress Notes (Signed)
Daily Session Note  Patient Details  Name: Jeanne [name] MRN: [id number] Date of Birth: 01/23/1936 Referring Provider:     Pulmonary Rehab Walk Test from 05/29/2019 in [place]  Referring Provider  Dr. Woody Seller      Encounter Date: 07/17/2019  Check In: Session Check In - 07/17/19 1118      Check-In   Supervising physician immediately available to respond to emergencies  [facility] immediately available    Physician(s)  Dr. Denton Brick    Location  [facility]    Staff Present  Rosebud Poles, RN, Bjorn Loser, MS, Exercise Physiologist;Lisa Ysidro Evert, RN    Virtual Visit  No    Medication changes reported      No    Fall or balance concerns reported     No    Tobacco Cessation  No Change    Warm-up and Cool-down  Performed on first and last piece of equipment    Resistance Training Performed  Yes    VAD Patient?  No    PAD/SET Patient?  No      Pain Assessment   Currently in Pain?  No/denies    Multiple Pain Sites  No       Capillary Blood Glucose: No results found for this or any previous visit (from the past 24 hour(s)).    Social History   Tobacco Use  Smoking Status Never Smoker  Smokeless Tobacco Never Used    Goals Met:  Independence with exercise equipment Exercise tolerated well Strength training completed today  Goals Unmet:  Not Applicable  Comments: Service time is from 1022 to 1115    Dr. Rush Farmer is Medical Director for Pulmonary Rehab at [facility].

## 2019-07-22 ENCOUNTER — Encounter (HOSPITAL_COMMUNITY)
Admission: RE | Admit: 2019-07-22 | Discharge: 2019-07-22 | Disposition: A | Discharge status: Home / Self Care | Payer: Medicare Other | Source: Ambulatory Visit | Attending: Pulmonary Disease | Admitting: Pulmonary Disease

## 2019-07-22 ENCOUNTER — Encounter (HOSPITAL_COMMUNITY): Payer: Medicare Other

## 2019-07-22 ENCOUNTER — Other Ambulatory Visit: Payer: Self-pay

## 2019-07-22 VITALS — Wt 326.1 lb

## 2019-07-22 DIAGNOSIS — I5032 Chronic diastolic (congestive) heart failure: Secondary | ICD-10-CM

## 2019-07-22 NOTE — Progress Notes (Signed)
Pulmonary Individual Treatment Plan  Patient Details  Name: Jeanne [name] MRN: [id number] Date of Birth: [birth year] Referring Provider:     Pulmonary Rehab Walk Test from 05/29/2019 in [place]  Referring Provider  Dr. Woody Seller      Initial Encounter Date:    Pulmonary Rehab Walk Test from 05/29/2019 in [place]  Date  05/29/19      Visit Diagnosis: Heart failure, diastolic, chronic ([place])  Patient's Home Medications on Admission:   Current Outpatient Medications:  .  albuterol (PROVENTIL HFA;VENTOLIN HFA) 108 (90 Base) MCG/ACT inhaler, Inhale 2 puffs into the lungs every 4 (four) hours as needed for wheezing or shortness of breath. (Patient not taking: Reported on 05/29/2019), Disp: 1 Inhaler, Rfl: 0 .  aspirin EC 81 MG tablet, Take 81 mg by mouth daily.  , Disp: , Rfl:  .  diltiazem (TIAZAC) 240 MG 24 hr capsule, Take 1 capsule (240 mg total) by mouth daily., Disp: 30 capsule, Rfl: 0 .  ezetimibe (ZETIA) 10 MG tablet, Take 10 mg by mouth every evening.  , Disp: , Rfl:  .  furosemide (LASIX) 20 MG tablet, TAKE 1 TABLET BY MOUTH EVERY DAY, Disp: 90 tablet, Rfl: 1 .  hydrALAZINE (APRESOLINE) 25 MG tablet, Take 1 tablet (25 mg total) by mouth 3 (three) times daily., Disp: 270 tablet, Rfl: 3 .  insulin glargine (LANTUS) 100 UNIT/ML injection, Inject 65 Units into the skin every morning. , Disp: , Rfl:  .  latanoprost (XALATAN) 0.005 % ophthalmic solution, Place 1 drop into the left eye at bedtime., Disp: , Rfl: 5 .  Menthol, Topical Analgesic, (BIOFREEZE EX), Apply 1 application topically daily as needed (back pain)., Disp: , Rfl:  .  oxybutynin (DITROPAN-XL) 10 MG 24 hr tablet, Take 10 mg by mouth daily.  , Disp: , Rfl:  .  pantoprazole (PROTONIX) 40 MG tablet, Take 40 mg by mouth daily., Disp: , Rfl:  .  Polyvinyl Alcohol-Povidone (REFRESH OP), Place 1 drop into both eyes at bedtime., Disp: , Rfl:  .  pravastatin  (PRAVACHOL) 40 MG tablet, Take 40 mg by mouth daily. , Disp: , Rfl:  .  probenecid (BENEMID) 500 MG tablet, Take 500 mg by mouth 2 (two) times daily., Disp: , Rfl: 3  Past Medical History: Past Medical History:  Diagnosis Date  . Anemia   . Arthritis   . Carotid arterial disease ([place])   . Diabetes mellitus   . Hyperlipidemia   . Hypertension   . Migraine   . Obesity   . Reflux   . Sleep apnea     Tobacco Use: Social History   Tobacco Use  Smoking Status Never Smoker  Smokeless Tobacco Never Used    Labs: Recent Review Flowsheet Data    Labs for ITP Cardiac and Pulmonary Rehab Latest Ref Rng & Units 02/02/2013 10/09/2015 10/12/2015 03/31/2019   Cholestrol 0 - 200 mg/dL - - 90 92   LDLCALC 0 - 99 mg/dL - - 35 45   HDL >40 mg/dL - - 28(L) 32(L)   Trlycerides <150 mg/dL - - 134 77   Hemoglobin A1c 4.8 - 5.6 % - 6.6(H) - -   TCO2 0 - 100 mmol/L 32 - - -      Capillary Blood Glucose: Lab Results  Component Value Date   GLUCAP 145 (H) 04/01/2019   GLUCAP 145 (H) 04/01/2019   GLUCAP 95 03/31/2019   GLUCAP 58 (L) 10/12/2015  GLUCAP 112 (H) 10/12/2015   POCT Glucose    Row Name 06/10/19 1155 06/24/19 1203 07/08/19 1140 07/22/19 1141       POCT Blood Glucose   Pre-Exercise  122 mg/dL  144 mg/dL  112 mg/dL  153 mg/dL    Post-Exercise  162 mg/dL  208 mg/dL  179 mg/dL  222 mg/dL       Pulmonary Assessment Scores: Pulmonary Assessment Scores    Row Name 05/29/19 1626         ADL UCSD   SOB Score total  80       CAT Score   CAT Score  31       UCSD: Self-administered rating of dyspnea associated with activities of daily living (ADLs) 6-point scale (0 = "not at all" to 5 = "maximal or unable to do because of breathlessness")  Scoring Scores range from 0 to 120.  Minimally important difference is 5 units  CAT: CAT can identify the health impairment of COPD patients and is better correlated with disease progression.  CAT has a scoring range of zero to 40. The CAT  score is classified into four groups of low (less than 10), medium (10 - 20), high (21-30) and very high (31-40) based on the impact level of disease on health status. A CAT score over 10 suggests significant symptoms.  A worsening CAT score could be explained by an exacerbation, poor medication adherence, poor inhaler technique, or progression of COPD or comorbid conditions.  CAT MCID is 2 points  mMRC: mMRC (Modified Medical Research Council) Dyspnea Scale is used to assess the degree of baseline functional disability in patients of respiratory disease due to dyspnea. No minimal important difference is established. A decrease in score of 1 point or greater is considered a positive change.   Pulmonary Function Assessment: Pulmonary Function Assessment - 05/29/19 1626      Breath   Shortness of Breath  Yes;Limiting activity       Exercise Target Goals: Exercise Program Goal: Individual exercise prescription set using results from initial 6 min walk test and THRR while considering  patient's activity barriers and safety.   Exercise Prescription Goal: Initial exercise prescription builds to 30-45 minutes a day of aerobic activity, 2-3 days per week.  Home exercise guidelines will be given to patient during program as part of exercise prescription that the participant will acknowledge.  Activity Barriers & Risk Stratification: Activity Barriers & Cardiac Risk Stratification - 05/29/19 1518      Activity Barriers & Cardiac Risk Stratification   Activity Barriers  Arthritis;Deconditioning;Muscular Weakness;Shortness of Breath;Balance Concerns       6 Minute Walk: 6 Minute Walk    Row Name 05/29/19 1535         6 Minute Walk   Phase  Initial     Distance  460 feet     Walk Time  6 minutes     # of Rest Breaks  2 Stop at minute 1:32, Start back at minute 2:56. Stop at minute 4:23, Start back at minute 5:37.     MPH  0.87     METS  -0.6     RPE  15     Perceived Dyspnea   3      VO2 Peak  -2.16     Resting HR  90 bpm     Resting BP  118/66     Resting Oxygen Saturation   99 %     Exercise Oxygen Saturation  during 6 min walk  97 %     Max Ex. HR  127 bpm     Max Ex. BP  126/80     2 Minute Post BP  108/64       Interval HR   1 Minute HR  124     2 Minute HR  118     3 Minute HR  107     4 Minute HR  127     5 Minute HR  114     6 Minute HR  113     2 Minute Post HR  81     Interval Heart Rate?  Yes       Interval Oxygen   Interval Oxygen?  Yes     Baseline Oxygen Saturation %  99 %     1 Minute Oxygen Saturation %  95 %     2 Minute Oxygen Saturation %  98 %     3 Minute Oxygen Saturation %  99 %     4 Minute Oxygen Saturation %  97 %     5 Minute Oxygen Saturation %  98 %     6 Minute Oxygen Saturation %  97 %     2 Minute Post Oxygen Saturation %  98 %        Oxygen Initial Assessment: Oxygen Initial Assessment - 05/29/19 1625      Home Oxygen   Sleep Oxygen Prescription  CPAP    Home Exercise Oxygen Prescription  None    Home at Rest Exercise Oxygen Prescription  None      Initial 6 min Walk   Oxygen Used  None      Program Oxygen Prescription   Program Oxygen Prescription  None       Oxygen Re-Evaluation: Oxygen Re-Evaluation    Row Name 06/24/19 0743 07/22/19 0706           Program Oxygen Prescription   Program Oxygen Prescription  None  None        Home Oxygen   Home Oxygen Device  None  None      Sleep Oxygen Prescription  CPAP  CPAP      Home Exercise Oxygen Prescription  None  None      Home at Rest Exercise Oxygen Prescription  None  None      Compliance with Home Oxygen Use  Yes  Yes        Goals/Expected Outcomes   Short Term Goals  To learn and exhibit compliance with exercise, home and travel O2 prescription;To learn and understand importance of monitoring SPO2 with pulse oximeter and demonstrate accurate use of the pulse oximeter.;To learn and understand importance of maintaining oxygen saturations>88%;To learn  and demonstrate proper pursed lip breathing techniques or other breathing techniques.;To learn and demonstrate proper use of respiratory medications  To learn and exhibit compliance with exercise, home and travel O2 prescription;To learn and understand importance of monitoring SPO2 with pulse oximeter and demonstrate accurate use of the pulse oximeter.;To learn and understand importance of maintaining oxygen saturations>88%;To learn and demonstrate proper pursed lip breathing techniques or other breathing techniques.;To learn and demonstrate proper use of respiratory medications      Long  Term Goals  Exhibits compliance with exercise, home and travel O2 prescription;Verbalizes importance of monitoring SPO2 with pulse oximeter and return demonstration;Maintenance of O2 saturations>88%;Exhibits proper breathing techniques, such as pursed lip breathing or other method taught during program session;Compliance with respiratory  medication  Exhibits compliance with exercise, home and travel O2 prescription;Verbalizes importance of monitoring SPO2 with pulse oximeter and return demonstration;Maintenance of O2 saturations>88%;Exhibits proper breathing techniques, such as pursed lip breathing or other method taught during program session;Compliance with respiratory medication      Goals/Expected Outcomes  compliance  compliance         Oxygen Discharge (Final Oxygen Re-Evaluation): Oxygen Re-Evaluation - 07/22/19 0706      Program Oxygen Prescription   Program Oxygen Prescription  None      Home Oxygen   Home Oxygen Device  None    Sleep Oxygen Prescription  CPAP    Home Exercise Oxygen Prescription  None    Home at Rest Exercise Oxygen Prescription  None    Compliance with Home Oxygen Use  Yes      Goals/Expected Outcomes   Short Term Goals  To learn and exhibit compliance with exercise, home and travel O2 prescription;To learn and understand importance of monitoring SPO2 with pulse oximeter and  demonstrate accurate use of the pulse oximeter.;To learn and understand importance of maintaining oxygen saturations>88%;To learn and demonstrate proper pursed lip breathing techniques or other breathing techniques.;To learn and demonstrate proper use of respiratory medications    Long  Term Goals  Exhibits compliance with exercise, home and travel O2 prescription;Verbalizes importance of monitoring SPO2 with pulse oximeter and return demonstration;Maintenance of O2 saturations>88%;Exhibits proper breathing techniques, such as pursed lip breathing or other method taught during program session;Compliance with respiratory medication    Goals/Expected Outcomes  compliance       Initial Exercise Prescription: Initial Exercise Prescription - 05/29/19 1415      Date of Initial Exercise RX and Referring Provider   Date  05/29/19    Referring Provider  Dr. Woody Seller      NuStep   Level  2    SPM  75    Minutes  15      Prescription Details   Frequency (times per week)  3    Duration  Progress to 30 minutes of continuous aerobic without signs/symptoms of physical distress      Intensity   THRR 40-80% of Max Heartrate  56-111    Ratings of Perceived Exertion  11-13    Perceived Dyspnea  0-4      Progression   Progression  Continue to progress workloads to maintain intensity without signs/symptoms of physical distress.      Resistance Training   Training Prescription  Yes    Weight  --   Orange Bands   Reps  10-15       Perform Capillary Blood Glucose checks as needed.  Exercise Prescription Changes: Exercise Prescription Changes    Row Name 06/10/19 1100 06/24/19 1200 07/01/19 1100 07/08/19 1100 07/22/19 1100     Response to Exercise   Blood Pressure (Admit)  122/60  120/80  -  102/64  110/68   Blood Pressure (Exercise)  118/60  120/80  -  120/70  120/66   Blood Pressure (Exit)  128/80  118/72  -  110/70  118/60   Heart Rate (Admit)  86 bpm  90 bpm  -  92 bpm  93 bpm   Heart Rate  (Exercise)  98 bpm  97 bpm  -  105 bpm  97 bpm   Heart Rate (Exit)  83 bpm  97 bpm  -  100 bpm  96 bpm   Oxygen Saturation (Admit)  98 %  98 %  -  99 %  99 %   Oxygen Saturation (Exercise)  98 %  99 %  -  99 %  97 %   Oxygen Saturation (Exit)  99 %  97 %  -  93 %  98 %   Rating of Perceived Exertion (Exercise)  13  11  -  0  13   Perceived Dyspnea (Exercise)  1  1  -  1  1   Duration  Continue with 30 min of aerobic exercise without signs/symptoms of physical distress.  Continue with 30 min of aerobic exercise without signs/symptoms of physical distress.  -  Continue with 30 min of aerobic exercise without signs/symptoms of physical distress.  Continue with 30 min of aerobic exercise without signs/symptoms of physical distress.   Intensity  - 40-80% HRR  THRR unchanged  -  THRR unchanged  THRR unchanged     Progression   Progression  Continue to progress workloads to maintain intensity without signs/symptoms of physical distress.  Continue to progress workloads to maintain intensity without signs/symptoms of physical distress.  -  Continue to progress workloads to maintain intensity without signs/symptoms of physical distress.  Continue to progress workloads to maintain intensity without signs/symptoms of physical distress.     Resistance Training   Training Prescription  Yes  Yes  -  Yes  Yes   Weight  orange bands  orange bands  -  orange bands  orange bands   Reps  10-15  10-15  -  10-15  10-15   Time  10 Minutes  10 Minutes  -  10 Minutes  10 Minutes     Interval Training   Interval Training  No  No  -  No  No     NuStep   Level  2  2  -  3  4   SPM  75  80  -  80  80   Minutes  15  30  -  30  30   METs  1.4  -  -  1.5  1.6     Home Exercise Plan   Plans to continue exercise at  -  -  Home (comment)  -  -   Frequency  -  -  Add 2 additional days to program exercise sessions.  -  -   Initial Home Exercises Provided  -  -  07/01/19  -  -      Exercise Comments: Exercise Comments     Row Name 07/01/19 1144           Exercise Comments  home exercise complete          Exercise Goals and Review: Exercise Goals    Row Name 06/24/19 0744 07/22/19 0708           Exercise Goals   Increase Physical Activity  Yes  Yes      Intervention  Provide advice, education, support and counseling about physical activity/exercise needs.;Develop an individualized exercise prescription for aerobic and resistive training based on initial evaluation findings, risk stratification, comorbidities and participant's personal goals.  Provide advice, education, support and counseling about physical activity/exercise needs.;Develop an individualized exercise prescription for aerobic and resistive training based on initial evaluation findings, risk stratification, comorbidities and participant's personal goals.      Expected Outcomes  Short Term: Attend rehab on a regular basis to increase amount of physical activity.;Long Term: Add in home exercise to make exercise part of routine and to  increase amount of physical activity.;Long Term: Exercising regularly at least 3-5 days a week.  Short Term: Attend rehab on a regular basis to increase amount of physical activity.;Long Term: Add in home exercise to make exercise part of routine and to increase amount of physical activity.;Long Term: Exercising regularly at least 3-5 days a week.      Increase Strength and Stamina  Yes  Yes      Intervention  Provide advice, education, support and counseling about physical activity/exercise needs.;Develop an individualized exercise prescription for aerobic and resistive training based on initial evaluation findings, risk stratification, comorbidities and participant's personal goals.  Provide advice, education, support and counseling about physical activity/exercise needs.;Develop an individualized exercise prescription for aerobic and resistive training based on initial evaluation findings, risk stratification,  comorbidities and participant's personal goals.      Expected Outcomes  Short Term: Increase workloads from initial exercise prescription for resistance, speed, and METs.;Short Term: Perform resistance training exercises routinely during rehab and add in resistance training at home;Long Term: Improve cardiorespiratory fitness, muscular endurance and strength as measured by increased METs and functional capacity (6MWT)  Short Term: Increase workloads from initial exercise prescription for resistance, speed, and METs.;Short Term: Perform resistance training exercises routinely during rehab and add in resistance training at home;Long Term: Improve cardiorespiratory fitness, muscular endurance and strength as measured by increased METs and functional capacity (6MWT)      Able to understand and use rate of perceived exertion (RPE) scale  Yes  Yes      Intervention  Provide education and explanation on how to use RPE scale  Provide education and explanation on how to use RPE scale      Expected Outcomes  Short Term: Able to use RPE daily in rehab to express subjective intensity level;Long Term:  Able to use RPE to guide intensity level when exercising independently  Short Term: Able to use RPE daily in rehab to express subjective intensity level;Long Term:  Able to use RPE to guide intensity level when exercising independently      Able to understand and use Dyspnea scale  Yes  Yes      Intervention  Provide education and explanation on how to use Dyspnea scale  Provide education and explanation on how to use Dyspnea scale      Expected Outcomes  Short Term: Able to use Dyspnea scale daily in rehab to express subjective sense of shortness of breath during exertion;Long Term: Able to use Dyspnea scale to guide intensity level when exercising independently  Short Term: Able to use Dyspnea scale daily in rehab to express subjective sense of shortness of breath during exertion;Long Term: Able to use Dyspnea scale to  guide intensity level when exercising independently      Knowledge and understanding of Target Heart Rate Range (THRR)  Yes  Yes      Expected Outcomes  Short Term: Able to state/look up THRR;Short Term: Able to use daily as guideline for intensity in rehab;Long Term: Able to use THRR to govern intensity when exercising independently  Short Term: Able to state/look up THRR;Short Term: Able to use daily as guideline for intensity in rehab;Long Term: Able to use THRR to govern intensity when exercising independently      Understanding of Exercise Prescription  Yes  Yes      Intervention  Provide education, explanation, and written materials on patient's individual exercise prescription  Provide education, explanation, and written materials on patient's individual exercise prescription  Expected Outcomes  Short Term: Able to explain program exercise prescription;Long Term: Able to explain home exercise prescription to exercise independently  Short Term: Able to explain program exercise prescription;Long Term: Able to explain home exercise prescription to exercise independently         Exercise Goals Re-Evaluation : Exercise Goals Re-Evaluation    Row Name 06/24/19 0744 07/22/19 0708           Exercise Goal Re-Evaluation   Exercise Goals Review  Increase Physical Activity;Increase Strength and Stamina;Able to understand and use rate of perceived exertion (RPE) scale;Able to understand and use Dyspnea scale;Knowledge and understanding of Target Heart Rate Range (THRR);Understanding of Exercise Prescription  Increase Physical Activity;Increase Strength and Stamina;Able to understand and use rate of perceived exertion (RPE) scale;Able to understand and use Dyspnea scale;Knowledge and understanding of Target Heart Rate Range (THRR);Understanding of Exercise Prescription      Comments  Pt has completed 5 exercise sessions. Pt is motivated to lose weight and live a more active lifestyle. She reports that  she now does her resistance bands and squats at home. Before the program, she did not engage in any activity. Pt currently exercises at 1.4 METs on the stepper. Will continue to monitor and progress as able.  Pt has completed 12 exercise sessions. Pt has lost 2.5 kg and has increased her workloads. Pt currently exercises at 1.6 METs. Pt reports feeling more comfortable walking without her cane and that she can do her squats easier now. Will continue to monitor and progress as able.      Expected Outcomes  Through exercise at rehab and at home, the patient will decrease shortness of breath with daily activities and feel confident in carrying out an exercise regime at home.  Through exercise at rehab and at home, the patient will decrease shortness of breath with daily activities and feel confident in carrying out an exercise regime at home.         Discharge Exercise Prescription (Final Exercise Prescription Changes): Exercise Prescription Changes - 07/22/19 1100      Response to Exercise   Blood Pressure (Admit)  110/68    Blood Pressure (Exercise)  120/66    Blood Pressure (Exit)  118/60    Heart Rate (Admit)  93 bpm    Heart Rate (Exercise)  97 bpm    Heart Rate (Exit)  96 bpm    Oxygen Saturation (Admit)  99 %    Oxygen Saturation (Exercise)  97 %    Oxygen Saturation (Exit)  98 %    Rating of Perceived Exertion (Exercise)  13    Perceived Dyspnea (Exercise)  1    Duration  Continue with 30 min of aerobic exercise without signs/symptoms of physical distress.    Intensity  THRR unchanged      Progression   Progression  Continue to progress workloads to maintain intensity without signs/symptoms of physical distress.      Resistance Training   Training Prescription  Yes    Weight  orange bands    Reps  10-15    Time  10 Minutes      Interval Training   Interval Training  No      NuStep   Level  4    SPM  80    Minutes  30    METs  1.6       Nutrition:  Target Goals:  Understanding of nutrition guidelines, daily intake of sodium <1575m, cholesterol <2073m calories 30% from  fat and 7% or less from saturated fats, daily to have 5 or more servings of fruits and vegetables.  Biometrics: Pre Biometrics - 05/29/19 1538      Pre Biometrics   Height  _0  (1.6 m)    Weight  (!) 150.2 kg    BMI (Calculated)  58.67        Nutrition Therapy Plan and Nutrition Goals:   Nutrition Assessments:   Nutrition Goals Re-Evaluation:   Nutrition Goals Discharge (Final Nutrition Goals Re-Evaluation):   Psychosocial: Target Goals: Acknowledge presence or absence of significant depression and/or stress, maximize coping skills, provide positive support system. Participant is able to verbalize types and ability to use techniques and skills needed for reducing stress and depression.  Initial Review & Psychosocial Screening: Initial Psych Review & Screening - 05/29/19 1630      Initial Review   Current issues with  None Identified      Family Dynamics   Good Support System?  Yes      Barriers   Psychosocial barriers to participate in program  There are no identifiable barriers or psychosocial needs.      Screening Interventions   Interventions  Encouraged to exercise       Quality of Life Scores:  Scores of 19 and below usually indicate a poorer quality of life in these areas.  A difference of  2-3 points is a clinically meaningful difference.  A difference of 2-3 points in the total score of the Quality of Life Index has been associated with significant improvement in overall quality of life, self-image, physical symptoms, and general health in studies assessing change in quality of life.  PHQ-9: Recent Review Flowsheet Data    Depression screen [facility] 2/9 05/29/2019   Decreased Interest 0   Down, Depressed, Hopeless 0   PHQ - 2 Score 0   Altered sleeping 0   Tired, decreased energy 0   Change in appetite 0   Feeling bad or failure about yourself  0    Trouble concentrating 0   Moving slowly or fidgety/restless 0   Suicidal thoughts 0   PHQ-9 Score 0   Difficult doing work/chores Not difficult at all     Interpretation of Total Score  Total Score Depression Severity:  1-4 = Minimal depression, 5-9 = Mild depression, 10-14 = Moderate depression, 15-19 = Moderately severe depression, 20-27 = Severe depression   Psychosocial Evaluation and Intervention: Psychosocial Evaluation - 05/29/19 1630      Psychosocial Evaluation & Interventions   Interventions  Encouraged to exercise with the program and follow exercise prescription    Comments  No barriers or psychosocial concerns identified    Expected Outcomes  No barriers to participation in pulmonary rehab.    Continue Psychosocial Services   No Follow up required       Psychosocial Re-Evaluation: Psychosocial Re-Evaluation    [place] Name 06/25/19 1106 07/21/19 1133           Psychosocial Re-Evaluation   Current issues with  None Identified  None Identified      Comments  -  No barriers or psychosocial concerns identified at this time.      Expected Outcomes  will continue to have no barriers or psychosocial concerns while in pulmonary rehab  That patient will continue to have a positive outlook and have no barriers or psychosocial concerns while in pulmonary rehab.      Interventions  Encouraged to attend Pulmonary Rehabilitation for the exercise  Encouraged to attend Pulmonary Rehabilitation for the exercise      Continue Psychosocial Services   No Follow up required  No Follow up required         Psychosocial Discharge (Final Psychosocial Re-Evaluation): Psychosocial Re-Evaluation - 07/21/19 1133      Psychosocial Re-Evaluation   Current issues with  None Identified    Comments  No barriers or psychosocial concerns identified at this time.    Expected Outcomes  That patient will continue to have a positive outlook and have no barriers or psychosocial concerns while in  pulmonary rehab.    Interventions  Encouraged to attend Pulmonary Rehabilitation for the exercise    Continue Psychosocial Services   No Follow up required       Education: Education Goals: Education classes will be provided on a weekly basis, covering required topics. Participant will state understanding/return demonstration of topics presented.  Learning Barriers/Preferences:   Education Topics: Risk Factor Reduction:  -Group instruction that is supported by a PowerPoint presentation. Instructor discusses the definition of a risk factor, different risk factors for pulmonary disease, and how the heart and lungs work together.     Nutrition for Pulmonary Patient:  -Group instruction provided by PowerPoint slides, verbal discussion, and written materials to support subject matter. The instructor gives an explanation and review of healthy diet recommendations, which includes a discussion on weight management, recommendations for fruit and vegetable consumption, as well as protein, fluid, caffeine, fiber, sodium, sugar, and alcohol. Tips for eating when patients are short of breath are discussed.   Pursed Lip Breathing:  -Group instruction that is supported by demonstration and informational handouts. Instructor discusses the benefits of pursed lip and diaphragmatic breathing and detailed demonstration on how to preform both.     Oxygen Safety:  -Group instruction provided by PowerPoint, verbal discussion, and written material to support subject matter. There is an overview of "What is Oxygen" and "Why do we need it".  Instructor also reviews how to create a safe environment for oxygen use, the importance of using oxygen as prescribed, and the risks of noncompliance. There is a brief discussion on traveling with oxygen and resources the patient may utilize.   Oxygen Equipment:  -Group instruction provided by [facility] Staff utilizing handouts, written materials, and equipment  demonstrations.   Signs and Symptoms:  -Group instruction provided by written material and verbal discussion to support subject matter. Warning signs and symptoms of infection, stroke, and heart attack are reviewed and when to call the physician/911 reinforced. Tips for preventing the spread of infection discussed.   Advanced Directives:  -Group instruction provided by verbal instruction and written material to support subject matter. Instructor reviews Advanced Directive laws and proper instruction for filling out document.   Pulmonary Video:  -Group video education that reviews the importance of medication and oxygen compliance, exercise, good nutrition, pulmonary hygiene, and pursed lip and diaphragmatic breathing for the pulmonary patient.   Exercise for the Pulmonary Patient:  -Group instruction that is supported by a PowerPoint presentation. Instructor discusses benefits of exercise, core components of exercise, frequency, duration, and intensity of an exercise routine, importance of utilizing pulse oximetry during exercise, safety while exercising, and options of places to exercise outside of rehab.     Pulmonary Medications:  -Verbally interactive group education provided by instructor with focus on inhaled medications and proper administration.   Anatomy and Physiology of the Respiratory System and Intimacy:  -Group instruction provided by PowerPoint, verbal discussion, and written  material to support subject matter. Instructor reviews respiratory cycle and anatomical components of the respiratory system and their functions. Instructor also reviews differences in obstructive and restrictive respiratory diseases with examples of each. Intimacy, Sex, and Sexuality differences are reviewed with a discussion on how relationships can change when diagnosed with pulmonary disease. Common sexual concerns are reviewed.   MD DAY -A group question and answer session with a medical doctor  that allows participants to ask questions that relate to their pulmonary disease state.   OTHER EDUCATION -Group or individual verbal, written, or video instructions that support the educational goals of the pulmonary rehab program.   Holiday Eating Survival Tips:  -Group instruction provided by PowerPoint slides, verbal discussion, and written materials to support subject matter. The instructor gives patients tips, tricks, and techniques to help them not only survive but enjoy the holidays despite the onslaught of food that accompanies the holidays.   PULMONARY REHAB OTHER RESPIRATORY from 07/22/2019 in [place]  Date  07/22/19  Educator  -- [handout]      Knowledge Questionnaire Score: Knowledge Questionnaire Score - 05/29/19 1627      Knowledge Questionnaire Score   Pre Score  14/18       Core Components/Risk Factors/Patient Goals at Admission: Personal Goals and Risk Factors at Admission - 05/29/19 1627      Core Components/Risk Factors/Patient Goals on Admission    Weight Management  Obesity    Improve shortness of breath with ADL's  Yes    Intervention  Provide education, individualized exercise plan and daily activity instruction to help decrease symptoms of SOB with activities of daily living.    Expected Outcomes  Short Term: Improve cardiorespiratory fitness to achieve a reduction of symptoms when performing ADLs;Long Term: Be able to perform more ADLs without symptoms or delay the onset of symptoms       Core Components/Risk Factors/Patient Goals Review:  Goals and Risk Factor Review    Row Name 05/29/19 1629 06/25/19 1107 07/21/19 1135         Core Components/Risk Factors/Patient Goals Review   Personal Goals Review  Weight Management/Obesity;Increase knowledge of respiratory medications and ability to use respiratory devices properly.;Improve shortness of breath with ADL's;Develop more efficient breathing techniques such as purse  lipped breathing and diaphragmatic breathing and practicing self-pacing with activity.  Weight Management/Obesity;Heart Failure;Improve shortness of breath with ADL's;Develop more efficient breathing techniques such as purse lipped breathing and diaphragmatic breathing and practicing self-pacing with activity.;Diabetes  Weight Management/Obesity;Heart Failure;Increase knowledge of respiratory medications and ability to use respiratory devices properly.;Improve shortness of breath with ADL's;Diabetes     Review  -  Just started program ,as attended 6 exercise sessions, has lost 1.3 kg, attendance good, on level 2 of nustep, has a good attitude despite her weight and orthopedic pain in knees  Diabetes and heart failure are stable, Tyrianna has lost 5 pounds since beginning pulmonary rehab.  She is exercising for 30 minutes on level 4 of the nustep which she seems to enjoy.  She is limited by her weight and due to balance issues she cannot walk on the treadmill.     Expected Outcomes  -  See admission goals  See admission goals.        Core Components/Risk Factors/Patient Goals at Discharge (Final Review):  Goals and Risk Factor Review - 07/21/19 1135      Core Components/Risk Factors/Patient Goals Review   Personal Goals Review  Weight Management/Obesity;Heart Failure;Increase knowledge  of respiratory medications and ability to use respiratory devices properly.;Improve shortness of breath with ADL's;Diabetes    Review  Diabetes and heart failure are stable, Antonina has lost 5 pounds since beginning pulmonary rehab.  She is exercising for 30 minutes on level 4 of the nustep which she seems to enjoy.  She is limited by her weight and due to balance issues she cannot walk on the treadmill.    Expected Outcomes  See admission goals.       ITP Comments:   Comments: ITP REVIEW Pt is making expected progress toward pulmonary rehab goals after completing 13 sessions. Recommend continued exercise, life style  modification, education, and utilization of breathing techniques to increase stamina and strength and decrease shortness of breath with exertion.

## 2019-07-23 ENCOUNTER — Ambulatory Visit (HOSPITAL_COMMUNITY)
Admission: RE | Admit: 2019-07-23 | Discharge: 2019-07-23 | Disposition: A | Discharge status: Home / Self Care | Payer: Medicare Other | Source: Ambulatory Visit | Attending: Oncology | Admitting: Oncology

## 2019-07-23 DIAGNOSIS — D472 Monoclonal gammopathy: Secondary | ICD-10-CM | POA: Insufficient documentation

## 2019-07-24 ENCOUNTER — Encounter (HOSPITAL_COMMUNITY)
Admission: RE | Admit: 2019-07-24 | Discharge: 2019-07-24 | Disposition: A | Discharge status: Home / Self Care | Payer: Medicare Other | Source: Ambulatory Visit | Attending: Pulmonary Disease | Admitting: Pulmonary Disease

## 2019-07-24 ENCOUNTER — Encounter (HOSPITAL_COMMUNITY): Payer: Medicare Other

## 2019-07-24 ENCOUNTER — Other Ambulatory Visit: Payer: Self-pay

## 2019-07-24 ENCOUNTER — Telehealth: Payer: Self-pay

## 2019-07-24 DIAGNOSIS — I5032 Chronic diastolic (congestive) heart failure: Secondary | ICD-10-CM

## 2019-07-24 NOTE — Telephone Encounter (Signed)
Patient returned phone call to office and was made aware of bone survey results. Patient verbalized understanding.

## 2019-07-24 NOTE — Telephone Encounter (Signed)
-----   Message from Wyatt Portela, MD sent at 07/24/2019  9:18 AM EST ----- Please let her know her xrays are normal.

## 2019-07-24 NOTE — Telephone Encounter (Signed)
Called patient to let her know results. Left voicemail to return call to office at [phone].

## 2019-07-24 NOTE — Progress Notes (Signed)
Daily Session Note  Patient Details  Name: Jeanne [name] MRN: [id number] Date of Birth: 05-26-1936 Referring Provider:     Pulmonary Rehab Walk Test from 05/29/2019 in [place]  Referring Provider  Dr. Woody Seller      Encounter Date: 07/24/2019  Check In: Session Check In - 07/24/19 1025      Check-In   Supervising physician immediately available to respond to emergencies  [facility] immediately available    Physician(s)  Dr. Wyline Copas    Location  [facility]    Staff Present  Rosebud Poles, RN, Bjorn Loser, MS, Exercise Physiologist;Neema Fluegge Ysidro Evert, RN    Virtual Visit  No    Medication changes reported      No    Fall or balance concerns reported     No    Tobacco Cessation  No Change    Warm-up and Cool-down  Performed as group-led instruction    Resistance Training Performed  Yes    VAD Patient?  No    PAD/SET Patient?  No      Pain Assessment   Currently in Pain?  No/denies    Multiple Pain Sites  No       Capillary Blood Glucose: No results found for this or any previous visit (from the past 24 hour(s)).    Social History   Tobacco Use  Smoking Status Never Smoker  Smokeless Tobacco Never Used    Goals Met:  Exercise tolerated well No report of cardiac concerns or symptoms Strength training completed today  Goals Unmet:  Not Applicable  Comments: Service time is from 1015 to 1110    Dr. Rush Farmer is Medical Director for Pulmonary Rehab at [facility].

## 2019-07-25 ENCOUNTER — Other Ambulatory Visit: Payer: Self-pay

## 2019-07-25 DIAGNOSIS — Z20822 Contact with and (suspected) exposure to covid-19: Secondary | ICD-10-CM

## 2019-07-28 LAB — NOVEL CORONAVIRUS, NAA: SARS-CoV-2, NAA: NOT DETECTED

## 2019-07-29 ENCOUNTER — Encounter (HOSPITAL_COMMUNITY)
Admission: RE | Admit: 2019-07-29 | Discharge: 2019-07-29 | Disposition: A | Discharge status: Home / Self Care | Payer: Medicare Other | Source: Ambulatory Visit | Attending: Pulmonary Disease | Admitting: Pulmonary Disease

## 2019-07-29 ENCOUNTER — Encounter (HOSPITAL_COMMUNITY): Payer: Medicare Other

## 2019-07-29 ENCOUNTER — Other Ambulatory Visit: Payer: Self-pay

## 2019-07-29 DIAGNOSIS — I5032 Chronic diastolic (congestive) heart failure: Secondary | ICD-10-CM | POA: Diagnosis not present

## 2019-08-04 ENCOUNTER — Ambulatory Visit: Payer: Medicare Other | Admitting: Cardiology

## 2019-08-11 NOTE — Progress Notes (Signed)
Discharge Progress Report  Patient Details  Name: Jeanne [name] MRN: [id number] Date of Birth: Sep 15, 1935 Referring Provider:     Pulmonary Rehab Walk Test from 05/29/2019 in [place]  Referring Provider  Dr. Woody Seller       Number of Visits: 15  Reason for Discharge:  Patient reached a stable level of exercise. Patient independent in their exercise. Patient has met program and personal goals.  Smoking History:  Social History   Tobacco Use  Smoking Status Never Smoker  Smokeless Tobacco Never Used    Diagnosis:  Heart failure, diastolic, chronic ([place])  ADL [facility]: Pulmonary Assessment Scores    Row Name 05/29/19 1626         ADL [facility]   SOB Score total  80       CAT Score   CAT Score  31        Initial Exercise Prescription: Initial Exercise Prescription - 05/29/19 1415      Date of Initial Exercise RX and Referring Provider   Date  05/29/19    Referring Provider  Dr. Woody Seller      NuStep   Level  2    SPM  75    Minutes  15      Prescription Details   Frequency (times per week)  3    Duration  Progress to 30 minutes of continuous aerobic without signs/symptoms of physical distress      Intensity   THRR 40-80% of Max Heartrate  56-111    Ratings of Perceived Exertion  11-13    Perceived Dyspnea  0-4      Progression   Progression  Continue to progress workloads to maintain intensity without signs/symptoms of physical distress.      Resistance Training   Training Prescription  Yes    Weight  --   Orange Bands   Reps  10-15       Discharge Exercise Prescription (Final Exercise Prescription Changes): Exercise Prescription Changes - 07/22/19 1100      Response to Exercise   Blood Pressure (Admit)  110/68    Blood Pressure (Exercise)  120/66    Blood Pressure (Exit)  118/60    Heart Rate (Admit)  93 bpm    Heart Rate (Exercise)  97 bpm    Heart Rate (Exit)  96 bpm    Oxygen Saturation (Admit)  99 %    Oxygen  Saturation (Exercise)  97 %    Oxygen Saturation (Exit)  98 %    Rating of Perceived Exertion (Exercise)  13    Perceived Dyspnea (Exercise)  1    Duration  Continue with 30 min of aerobic exercise without signs/symptoms of physical distress.    Intensity  THRR unchanged      Progression   Progression  Continue to progress workloads to maintain intensity without signs/symptoms of physical distress.      Resistance Training   Training Prescription  Yes    Weight  orange bands    Reps  10-15    Time  10 Minutes      Interval Training   Interval Training  No      NuStep   Level  4    SPM  80    Minutes  30    METs  1.6       Functional Capacity: 6 Minute Walk    Row Name 05/29/19 1535 07/29/19 1148       6 Minute  Walk   Phase  Initial  Discharge    Distance  460 feet  563 feet    Distance Feet Change  -  103 ft    Walk Time  6 minutes  4 minutes    # of Rest Breaks  2 Stop at minute 1:32, Start back at minute 2:56. Stop at minute 4:23, Start back at minute 5:37.  3    MPH  0.87  1.07    METS  -0.6  -0.16 heavy weight and low distance causes the 2mt equation to give negative value    RPE  15  17    Perceived Dyspnea   3  3    VO2 Peak  -2.16  -0.56 681m equation gives negative number due to high weight and low distance    Symptoms  -  Yes (comment)    Comments  -  1 standing rest break for 20 sec, seated rest break for 1 min, seated rest break 40 sec    Resting HR  90 bpm  89 bpm    Resting BP  118/66  100/60    Resting Oxygen Saturation   99 %  98 %    Exercise Oxygen Saturation  during 6 min walk  97 %  96 %    Max Ex. HR  127 bpm  147 bpm    Max Ex. BP  126/80  130/72    2 Minute Post BP  108/64  130/78      Interval HR   1 Minute HR  124  135    2 Minute HR  118  144    3 Minute HR  107  141    4 Minute HR  127  147    5 Minute HR  114  137    6 Minute HR  113  131    2 Minute Post HR  81  96    Interval Heart Rate?  Yes  Yes      Interval Oxygen    Interval Oxygen?  Yes  Yes    Baseline Oxygen Saturation %  99 %  98 %    1 Minute Oxygen Saturation %  95 %  96 %    1 Minute Liters of Oxygen  -  0 L    2 Minute Oxygen Saturation %  98 %  98 %    2 Minute Liters of Oxygen  -  0 L    3 Minute Oxygen Saturation %  99 %  97 %    3 Minute Liters of Oxygen  -  0 L    4 Minute Oxygen Saturation %  97 %  96 %    4 Minute Liters of Oxygen  -  0 L    5 Minute Oxygen Saturation %  98 %  100 %    5 Minute Liters of Oxygen  -  0 L    6 Minute Oxygen Saturation %  97 %  99 %    6 Minute Liters of Oxygen  -  0 L    2 Minute Post Oxygen Saturation %  98 %  100 %    2 Minute Post Liters of Oxygen  -  0 L       Psychological, QOL, Others - Outcomes: PHQ 2/9: Depression screen PHQ 2/9 05/29/2019  Decreased Interest 0  Down, Depressed, Hopeless 0  PHQ - 2 Score 0  Altered sleeping 0  Tired,  decreased energy 0  Change in appetite 0  Feeling bad or failure about yourself  0  Trouble concentrating 0  Moving slowly or fidgety/restless 0  Suicidal thoughts 0  PHQ-9 Score 0  Difficult doing work/chores Not difficult at all    Quality of Life:   Personal Goals: Goals established at orientation with interventions provided to work toward goal. Personal Goals and Risk Factors at Admission - 05/29/19 1627      Core Components/Risk Factors/Patient Goals on Admission    Weight Management  Obesity    Improve shortness of breath with ADL's  Yes    Intervention  Provide education, individualized exercise plan and daily activity instruction to help decrease symptoms of SOB with activities of daily living.    Expected Outcomes  Short Term: Improve cardiorespiratory fitness to achieve a reduction of symptoms when performing ADLs;Long Term: Be able to perform more ADLs without symptoms or delay the onset of symptoms        Personal Goals Discharge: Goals and Risk Factor Review    Row Name 05/29/19 1629 06/25/19 1107 07/21/19 1135         Core  Components/Risk Factors/Patient Goals Review   Personal Goals Review  Weight Management/Obesity;Increase knowledge of respiratory medications and ability to use respiratory devices properly.;Improve shortness of breath with ADL's;Develop more efficient breathing techniques such as purse lipped breathing and diaphragmatic breathing and practicing self-pacing with activity.  Weight Management/Obesity;Heart Failure;Improve shortness of breath with ADL's;Develop more efficient breathing techniques such as purse lipped breathing and diaphragmatic breathing and practicing self-pacing with activity.;Diabetes  Weight Management/Obesity;Heart Failure;Increase knowledge of respiratory medications and ability to use respiratory devices properly.;Improve shortness of breath with ADL's;Diabetes     Review  -  Just started program ,as attended 6 exercise sessions, has lost 1.3 kg, attendance good, on level 2 of nustep, has a good attitude despite her weight and orthopedic pain in knees  Diabetes and heart failure are stable, Zeffie has lost 5 pounds since beginning pulmonary rehab.  She is exercising for 30 minutes on level 4 of the nustep which she seems to enjoy.  She is limited by her weight and due to balance issues she cannot walk on the treadmill.     Expected Outcomes  -  See admission goals  See admission goals.        Exercise Goals and Review: Exercise Goals    Row Name 06/24/19 0744 07/22/19 0708           Exercise Goals   Increase Physical Activity  Yes  Yes      Intervention  Provide advice, education, support and counseling about physical activity/exercise needs.;Develop an individualized exercise prescription for aerobic and resistive training based on initial evaluation findings, risk stratification, comorbidities and participant's personal goals.  Provide advice, education, support and counseling about physical activity/exercise needs.;Develop an individualized exercise prescription for aerobic  and resistive training based on initial evaluation findings, risk stratification, comorbidities and participant's personal goals.      Expected Outcomes  Short Term: Attend rehab on a regular basis to increase amount of physical activity.;Long Term: Add in home exercise to make exercise part of routine and to increase amount of physical activity.;Long Term: Exercising regularly at least 3-5 days a week.  Short Term: Attend rehab on a regular basis to increase amount of physical activity.;Long Term: Add in home exercise to make exercise part of routine and to increase amount of physical activity.;Long Term: Exercising regularly at least 3-5 days  a week.      Increase Strength and Stamina  Yes  Yes      Intervention  Provide advice, education, support and counseling about physical activity/exercise needs.;Develop an individualized exercise prescription for aerobic and resistive training based on initial evaluation findings, risk stratification, comorbidities and participant's personal goals.  Provide advice, education, support and counseling about physical activity/exercise needs.;Develop an individualized exercise prescription for aerobic and resistive training based on initial evaluation findings, risk stratification, comorbidities and participant's personal goals.      Expected Outcomes  Short Term: Increase workloads from initial exercise prescription for resistance, speed, and METs.;Short Term: Perform resistance training exercises routinely during rehab and add in resistance training at home;Long Term: Improve cardiorespiratory fitness, muscular endurance and strength as measured by increased METs and functional capacity (6MWT)  Short Term: Increase workloads from initial exercise prescription for resistance, speed, and METs.;Short Term: Perform resistance training exercises routinely during rehab and add in resistance training at home;Long Term: Improve cardiorespiratory fitness, muscular endurance and  strength as measured by increased METs and functional capacity (6MWT)      Able to understand and use rate of perceived exertion (RPE) scale  Yes  Yes      Intervention  Provide education and explanation on how to use RPE scale  Provide education and explanation on how to use RPE scale      Expected Outcomes  Short Term: Able to use RPE daily in rehab to express subjective intensity level;Long Term:  Able to use RPE to guide intensity level when exercising independently  Short Term: Able to use RPE daily in rehab to express subjective intensity level;Long Term:  Able to use RPE to guide intensity level when exercising independently      Able to understand and use Dyspnea scale  Yes  Yes      Intervention  Provide education and explanation on how to use Dyspnea scale  Provide education and explanation on how to use Dyspnea scale      Expected Outcomes  Short Term: Able to use Dyspnea scale daily in rehab to express subjective sense of shortness of breath during exertion;Long Term: Able to use Dyspnea scale to guide intensity level when exercising independently  Short Term: Able to use Dyspnea scale daily in rehab to express subjective sense of shortness of breath during exertion;Long Term: Able to use Dyspnea scale to guide intensity level when exercising independently      Knowledge and understanding of Target Heart Rate Range (THRR)  Yes  Yes      Expected Outcomes  Short Term: Able to state/look up THRR;Short Term: Able to use daily as guideline for intensity in rehab;Long Term: Able to use THRR to govern intensity when exercising independently  Short Term: Able to state/look up THRR;Short Term: Able to use daily as guideline for intensity in rehab;Long Term: Able to use THRR to govern intensity when exercising independently      Understanding of Exercise Prescription  Yes  Yes      Intervention  Provide education, explanation, and written materials on patient's individual exercise prescription  Provide  education, explanation, and written materials on patient's individual exercise prescription      Expected Outcomes  Short Term: Able to explain program exercise prescription;Long Term: Able to explain home exercise prescription to exercise independently  Short Term: Able to explain program exercise prescription;Long Term: Able to explain home exercise prescription to exercise independently         Exercise Goals Re-Evaluation:  Exercise Goals Re-Evaluation    Row Name 06/24/19 0744 07/22/19 0708           Exercise Goal Re-Evaluation   Exercise Goals Review  Increase Physical Activity;Increase Strength and Stamina;Able to understand and use rate of perceived exertion (RPE) scale;Able to understand and use Dyspnea scale;Knowledge and understanding of Target Heart Rate Range (THRR);Understanding of Exercise Prescription  Increase Physical Activity;Increase Strength and Stamina;Able to understand and use rate of perceived exertion (RPE) scale;Able to understand and use Dyspnea scale;Knowledge and understanding of Target Heart Rate Range (THRR);Understanding of Exercise Prescription      Comments  Pt has completed 5 exercise sessions. Pt is motivated to lose weight and live a more active lifestyle. She reports that she now does her resistance bands and squats at home. Before the program, she did not engage in any activity. Pt currently exercises at 1.4 METs on the stepper. Will continue to monitor and progress as able.  Pt has completed 12 exercise sessions. Pt has lost 2.5 kg and has increased her workloads. Pt currently exercises at 1.6 METs. Pt reports feeling more comfortable walking without her cane and that she can do her squats easier now. Will continue to monitor and progress as able.      Expected Outcomes  Through exercise at rehab and at home, the patient will decrease shortness of breath with daily activities and feel confident in carrying out an exercise regime at home.  Through exercise at  rehab and at home, the patient will decrease shortness of breath with daily activities and feel confident in carrying out an exercise regime at home.         Nutrition & Weight - Outcomes: Pre Biometrics - 05/29/19 1538      Pre Biometrics   Height  '5\' 3"'  (1.6 m)    Weight  (!) 150.2 kg    BMI (Calculated)  58.67        Nutrition:   Nutrition Discharge:   Education Questionnaire Score: Knowledge Questionnaire Score - 05/29/19 1627      Knowledge Questionnaire Score   Pre Score  14/18       Goals reviewed with patient; copy given to patient.

## 2019-08-25 ENCOUNTER — Other Ambulatory Visit: Payer: Self-pay

## 2019-09-08 ENCOUNTER — Other Ambulatory Visit: Payer: Self-pay

## 2019-09-08 MED ORDER — NITROGLYCERIN 0.4 MG SL SUBL: 0.4000 mg | SUBLINGUAL_TABLET | SUBLINGUAL | 3 refills | Status: DC | PRN

## 2019-09-25 ENCOUNTER — Ambulatory Visit: Payer: Medicare Other | Admitting: Cardiology

## 2019-09-28 ENCOUNTER — Ambulatory Visit: Payer: Medicare Other | Attending: Internal Medicine

## 2019-09-28 DIAGNOSIS — Z23 Encounter for immunization: Secondary | ICD-10-CM | POA: Insufficient documentation

## 2019-09-29 NOTE — Progress Notes (Signed)
   Covid-19 Vaccination Clinic  Name:  Jeanne [name]    MRN: [id number] DOB: March 16, 1936  09/28/2019  [name] was observed post Covid-19 immunization for 15 minutes without incidence. She was provided with Vaccine Information Sheet and instruction to access the V-Safe system.   [name] was instructed to call 911 with any severe reactions post vaccine: Marland Kitchen Difficulty breathing  . Swelling of your face and throat  . A fast heartbeat  . A bad rash all over your body  . Dizziness and weakness    Immunizations Administered    Name Date Dose VIS Date Route   Moderna COVID-19 Vaccine 09/28/2019  2:12 PM 0.5 mL 08/05/2019 Intramuscular   Manufacturer: Levan Hurst   Lot: [id number]   Lydia: [id number]      Documented on behalf of: K. Robinsono

## 2019-10-10 ENCOUNTER — Encounter (HOSPITAL_COMMUNITY): Payer: Self-pay | Admitting: Emergency Medicine

## 2019-10-10 ENCOUNTER — Inpatient Hospital Stay (HOSPITAL_COMMUNITY)
Admission: EM | Admit: 2019-10-10 | Discharge: 2019-10-17 | DRG: 177 | Disposition: A | Discharge status: Skilled Nursing Facility | Payer: Medicare Other | Source: Ambulatory Visit | Attending: Internal Medicine | Admitting: Internal Medicine

## 2019-10-10 ENCOUNTER — Ambulatory Visit (INDEPENDENT_AMBULATORY_CARE_PROVIDER_SITE_OTHER)
Admission: EM | Admit: 2019-10-10 | Discharge: 2019-10-10 | Disposition: A | Discharge status: Home / Self Care | Payer: Medicare Other | Source: Home / Self Care | Attending: Family Medicine | Admitting: Family Medicine

## 2019-10-10 ENCOUNTER — Ambulatory Visit (INDEPENDENT_AMBULATORY_CARE_PROVIDER_SITE_OTHER): Payer: Medicare Other

## 2019-10-10 ENCOUNTER — Other Ambulatory Visit: Payer: Self-pay

## 2019-10-10 DIAGNOSIS — D649 Anemia, unspecified: Secondary | ICD-10-CM | POA: Insufficient documentation

## 2019-10-10 DIAGNOSIS — Z8249 Family history of ischemic heart disease and other diseases of the circulatory system: Secondary | ICD-10-CM

## 2019-10-10 DIAGNOSIS — J189 Pneumonia, unspecified organism: Secondary | ICD-10-CM

## 2019-10-10 DIAGNOSIS — R110 Nausea: Secondary | ICD-10-CM | POA: Insufficient documentation

## 2019-10-10 DIAGNOSIS — Z79899 Other long term (current) drug therapy: Secondary | ICD-10-CM

## 2019-10-10 DIAGNOSIS — Z7982 Long term (current) use of aspirin: Secondary | ICD-10-CM

## 2019-10-10 DIAGNOSIS — Z794 Long term (current) use of insulin: Secondary | ICD-10-CM

## 2019-10-10 DIAGNOSIS — Z20822 Contact with and (suspected) exposure to covid-19: Secondary | ICD-10-CM

## 2019-10-10 DIAGNOSIS — D472 Monoclonal gammopathy: Secondary | ICD-10-CM | POA: Diagnosis present

## 2019-10-10 DIAGNOSIS — E785 Hyperlipidemia, unspecified: Secondary | ICD-10-CM | POA: Diagnosis present

## 2019-10-10 DIAGNOSIS — U071 COVID-19: Secondary | ICD-10-CM | POA: Diagnosis not present

## 2019-10-10 DIAGNOSIS — R079 Chest pain, unspecified: Secondary | ICD-10-CM | POA: Insufficient documentation

## 2019-10-10 DIAGNOSIS — E119 Type 2 diabetes mellitus without complications: Secondary | ICD-10-CM

## 2019-10-10 DIAGNOSIS — G43909 Migraine, unspecified, not intractable, without status migrainosus: Secondary | ICD-10-CM | POA: Insufficient documentation

## 2019-10-10 DIAGNOSIS — N183 Chronic kidney disease, stage 3 unspecified: Secondary | ICD-10-CM | POA: Diagnosis present

## 2019-10-10 DIAGNOSIS — E1122 Type 2 diabetes mellitus with diabetic chronic kidney disease: Secondary | ICD-10-CM | POA: Diagnosis present

## 2019-10-10 DIAGNOSIS — Z880 Allergy status to penicillin: Secondary | ICD-10-CM

## 2019-10-10 DIAGNOSIS — D631 Anemia in chronic kidney disease: Secondary | ICD-10-CM | POA: Diagnosis present

## 2019-10-10 DIAGNOSIS — I5032 Chronic diastolic (congestive) heart failure: Secondary | ICD-10-CM | POA: Diagnosis present

## 2019-10-10 DIAGNOSIS — J9601 Acute respiratory failure with hypoxia: Secondary | ICD-10-CM | POA: Diagnosis not present

## 2019-10-10 DIAGNOSIS — K219 Gastro-esophageal reflux disease without esophagitis: Secondary | ICD-10-CM | POA: Insufficient documentation

## 2019-10-10 DIAGNOSIS — N179 Acute kidney failure, unspecified: Secondary | ICD-10-CM | POA: Diagnosis present

## 2019-10-10 DIAGNOSIS — Z713 Dietary counseling and surveillance: Secondary | ICD-10-CM

## 2019-10-10 DIAGNOSIS — M1990 Unspecified osteoarthritis, unspecified site: Secondary | ICD-10-CM | POA: Diagnosis present

## 2019-10-10 DIAGNOSIS — I209 Angina pectoris, unspecified: Secondary | ICD-10-CM | POA: Diagnosis present

## 2019-10-10 DIAGNOSIS — I130 Hypertensive heart and chronic kidney disease with heart failure and stage 1 through stage 4 chronic kidney disease, or unspecified chronic kidney disease: Secondary | ICD-10-CM | POA: Diagnosis present

## 2019-10-10 DIAGNOSIS — R059 Cough, unspecified: Secondary | ICD-10-CM

## 2019-10-10 DIAGNOSIS — E876 Hypokalemia: Secondary | ICD-10-CM | POA: Diagnosis present

## 2019-10-10 DIAGNOSIS — E7800 Pure hypercholesterolemia, unspecified: Secondary | ICD-10-CM | POA: Insufficient documentation

## 2019-10-10 DIAGNOSIS — R0602 Shortness of breath: Secondary | ICD-10-CM

## 2019-10-10 DIAGNOSIS — I119 Hypertensive heart disease without heart failure: Secondary | ICD-10-CM | POA: Diagnosis present

## 2019-10-10 DIAGNOSIS — I2510 Atherosclerotic heart disease of native coronary artery without angina pectoris: Secondary | ICD-10-CM | POA: Diagnosis present

## 2019-10-10 DIAGNOSIS — Z96651 Presence of right artificial knee joint: Secondary | ICD-10-CM | POA: Diagnosis present

## 2019-10-10 DIAGNOSIS — N184 Chronic kidney disease, stage 4 (severe): Secondary | ICD-10-CM | POA: Diagnosis present

## 2019-10-10 DIAGNOSIS — Z888 Allergy status to other drugs, medicaments and biological substances status: Secondary | ICD-10-CM

## 2019-10-10 DIAGNOSIS — R438 Other disturbances of smell and taste: Secondary | ICD-10-CM | POA: Insufficient documentation

## 2019-10-10 DIAGNOSIS — R05 Cough: Secondary | ICD-10-CM

## 2019-10-10 DIAGNOSIS — R778 Other specified abnormalities of plasma proteins: Secondary | ICD-10-CM | POA: Diagnosis not present

## 2019-10-10 DIAGNOSIS — I10 Essential (primary) hypertension: Secondary | ICD-10-CM | POA: Diagnosis present

## 2019-10-10 DIAGNOSIS — Z66 Do not resuscitate: Secondary | ICD-10-CM | POA: Diagnosis present

## 2019-10-10 DIAGNOSIS — I129 Hypertensive chronic kidney disease with stage 1 through stage 4 chronic kidney disease, or unspecified chronic kidney disease: Secondary | ICD-10-CM | POA: Insufficient documentation

## 2019-10-10 DIAGNOSIS — J159 Unspecified bacterial pneumonia: Secondary | ICD-10-CM | POA: Diagnosis present

## 2019-10-10 DIAGNOSIS — I519 Heart disease, unspecified: Secondary | ICD-10-CM | POA: Diagnosis present

## 2019-10-10 DIAGNOSIS — R202 Paresthesia of skin: Secondary | ICD-10-CM | POA: Diagnosis present

## 2019-10-10 DIAGNOSIS — E662 Morbid (severe) obesity with alveolar hypoventilation: Secondary | ICD-10-CM | POA: Diagnosis present

## 2019-10-10 DIAGNOSIS — J1282 Pneumonia due to coronavirus disease 2019: Secondary | ICD-10-CM | POA: Diagnosis present

## 2019-10-10 DIAGNOSIS — Z6841 Body Mass Index (BMI) 40.0 and over, adult: Secondary | ICD-10-CM

## 2019-10-10 LAB — BASIC METABOLIC PANEL
Anion gap: 14 (ref 5–15)
BUN: 37 mg/dL — ABNORMAL HIGH (ref 8–23)
CO2: 28 mmol/L (ref 22–32)
Calcium: 8.8 mg/dL — ABNORMAL LOW (ref 8.9–10.3)
Chloride: 90 mmol/L — ABNORMAL LOW (ref 98–111)
Creatinine, Ser: 2.32 mg/dL — ABNORMAL HIGH (ref 0.44–1.00)
GFR calc Af Amer: 22 mL/min — ABNORMAL LOW (ref >60)
GFR calc non Af Amer: 19 mL/min — ABNORMAL LOW (ref >60)
Glucose, Bld: 224 mg/dL — ABNORMAL HIGH (ref 70–99)
Potassium: 3.5 mmol/L (ref 3.5–5.1)
Sodium: 132 mmol/L — ABNORMAL LOW (ref 135–145)

## 2019-10-10 LAB — CBC
HCT: 37.7 % (ref 36.0–46.0)
Hemoglobin: 11.9 g/dL — ABNORMAL LOW (ref 12.0–15.0)
MCH: 27.6 pg (ref 26.0–34.0)
MCHC: 31.6 g/dL (ref 30.0–36.0)
MCV: 87.5 fL (ref 80.0–100.0)
Platelets: 152 10*3/uL (ref 150–400)
RBC: 4.31 MIL/uL (ref 3.87–5.11)
RDW: 13.7 % (ref 11.5–15.5)
WBC: 4.3 10*3/uL (ref 4.0–10.5)
nRBC: 0 % (ref 0.0–0.2)

## 2019-10-10 LAB — POC SARS CORONAVIRUS 2 AG -  ED: SARS Coronavirus 2 Ag: NEGATIVE

## 2019-10-10 LAB — POC SARS CORONAVIRUS 2 AG: SARS Coronavirus 2 Ag: NEGATIVE

## 2019-10-10 MED ORDER — SODIUM CHLORIDE 0.9% FLUSH: 3.0000 mL | Freq: Once | INTRAVENOUS | Status: DC

## 2019-10-10 NOTE — Discharge Instructions (Signed)
You have early pneumonia I am concerned about your degree of weakness I believe you need to go to the ER

## 2019-10-10 NOTE — ED Provider Notes (Addendum)
[place]    CSN: [id number] Arrival date & time: 10/10/19  1625      History   Chief Complaint Chief Complaint  Patient presents with  . Shortness of Breath    HPI Jeanne [name] is a 84 y.o. female.   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  Since Wednesday she has had loss of taste and smell, shortness of breath, fatigue, chest pressure and some body aches.  No headache.  No appetite.  Nausea this morning with no vomiting.  No change in bowels.  No rash. No known exposure to coronavirus.  Her daughter was diagnosed today with bilateral pneumonia.  She was told she possibly has coronavirus but initial testing was negative. Patient had her first Covid vaccination on September 28, 2019 She states she has not had any sweats or chills or fever. Is morbidly obese.  She is immobile, states that she currently needs assistance to walk because of the degree of her weakness.  She is in a wheelchair.  She has lung disease, heart disease, obesity, hypertension, hyperlipidemia and insulin-dependent diabetes.  She would be at very high risk of complication should she develop coronavirus.  Past Medical History:  Diagnosis Date  . Anemia   . Arthritis   . Carotid arterial disease ([place])   . Diabetes mellitus   . Hyperlipidemia   . Hypertension   . Migraine   . Obesity   . Reflux   . Sleep apnea     Patient Active Problem List   Diagnosis Date Noted  . Diastolic dysfunction, left ventricle 04/05/2019  . Atherosclerosis of native coronary artery of native heart without angina pectoris 11/23/2018  . Hypercholesteremia 11/23/2018  . Unstable angina ([place]) 10/12/2015  . Abnormal myocardial perfusion study 10/12/2015  . NSVT (nonsustained ventricular tachycardia) ([place]) 10/11/2015  . Hypertension with heart disease 10/11/2015  . CKD (chronic kidney disease), stage III 10/11/2015  . Pain in the chest   . Chest pain 10/08/2015  . Diabetes mellitus type II, non  insulin dependent ([place]) 10/08/2015  . AKI (acute kidney injury) ([place])   . Abscess of left arm 01/01/2013  . Migraine   . Morbid obesity ([place])   . Sleep apnea   . Stenosis of left carotid artery   . Reflux   . Anemia     Past Surgical History:  Procedure Laterality Date  . CARDIAC CATHETERIZATION N/A 10/12/2015   Procedure: Left Heart Cath and Coronary Angiography;  Surgeon: Belva Crome, MD;  Location: [facility];  Service: Cardiovascular;  Laterality: N/A;  . CHOLECYSTECTOMY  50 yrs ago  . JOINT REPLACEMENT Right 2005   knee  . WISDOM TOOTH EXTRACTION      OB History   No obstetric history on file.      Home Medications    Prior to Admission medications   Medication Sig Start Date End Date Taking? Authorizing Provider  albuterol (PROVENTIL HFA;VENTOLIN HFA) 108 (90 Base) MCG/ACT inhaler Inhale 2 puffs into the lungs every 4 (four) hours as needed for wheezing or shortness of breath. Patient not taking: Reported on 05/29/2019 09/11/16   Melony Overly, MD  aspirin EC 81 MG tablet Take 81 mg by mouth daily.      [provider]  diltiazem (TIAZAC) 240 MG 24 hr capsule Take 1 capsule (240 mg total) by mouth daily. 04/07/19   Despina Hick, MD  ezetimibe (ZETIA) 10 MG tablet Take 10 mg by mouth  every evening.      [provider]  furosemide (LASIX) 20 MG tablet TAKE 1 TABLET BY MOUTH EVERY DAY 07/01/19   Adrian Prows, MD  hydrALAZINE (APRESOLINE) 25 MG tablet Take 1 tablet (25 mg total) by mouth 3 (three) times daily. 04/28/19 07/27/19  Despina Hick, MD  insulin glargine (LANTUS) 100 UNIT/ML injection Inject 65 Units into the skin every morning.     [provider]  latanoprost (XALATAN) 0.005 % ophthalmic solution Place 1 drop into the left eye at bedtime. 09/26/15   [provider]  Menthol, Topical Analgesic, (BIOFREEZE EX) Apply 1 application topically daily as needed (back pain).    [provider]  nitroGLYCERIN (NITROSTAT)  0.4 MG SL tablet Place 1 tablet (0.4 mg total) under the tongue as needed for chest pain. 09/08/19   Miquel Dunn, NP  oxybutynin (DITROPAN-XL) 10 MG 24 hr tablet Take 10 mg by mouth daily.      [provider]  pantoprazole (PROTONIX) 40 MG tablet Take 40 mg by mouth daily. 08/25/15   [provider]  Polyvinyl Alcohol-Povidone (REFRESH OP) Place 1 drop into both eyes at bedtime.    [provider]  pravastatin (PRAVACHOL) 40 MG tablet Take 40 mg by mouth daily.     [provider]  probenecid (BENEMID) 500 MG tablet Take 500 mg by mouth 2 (two) times daily. 01/10/18   [provider]    Family History Family History  Problem Relation Age of Onset  . Heart attack Father   . Heart attack Mother     Social History Social History   Tobacco Use  . Smoking status: Never Smoker  . Smokeless tobacco: Never Used  Substance Use Topics  . Alcohol use: Never  . Drug use: No     Allergies   Amoxicillin and Invokana [canagliflozin]   Review of Systems Review of Systems  Constitutional: Positive for activity change, appetite change and fatigue. Negative for chills and fever.  HENT: Positive for postnasal drip. Negative for congestion.        Mucus in back of throat  Respiratory: Positive for cough, chest tightness and shortness of breath.   Cardiovascular: Positive for chest pain. Negative for palpitations and leg swelling.  Gastrointestinal: Positive for nausea. Negative for diarrhea and vomiting.  Musculoskeletal: Positive for gait problem.       In wheelchair, weakness  Skin: Negative for rash.  Neurological: Positive for weakness. Negative for headaches.     Physical Exam Triage Vital Signs ED Triage Vitals  Enc Vitals Group     BP 10/10/19 1647 118/72     Pulse Rate 10/10/19 1647 88     Resp 10/10/19 1647 20     Temp 10/10/19 1647 100 F (37.8 C)     Temp src --      SpO2 10/10/19 1647 95 %     Weight --      Height  --      Head Circumference --      Peak Flow --      Pain Score 10/10/19 1649 6     Pain Loc --      Pain Edu? --      Excl. in [place]? --    No data found.  Updated Vital Signs BP 118/72   Pulse 88   Temp 100 F (37.8 C)   Resp 20   SpO2 95%      Physical Exam Constitutional:  General: She is not in acute distress.    Appearance: She is well-developed. She is obese. She is ill-appearing.     Comments: Appears tired, weak  HENT:     Head: Normocephalic and atraumatic.  Eyes:     Conjunctiva/sclera: Conjunctivae normal.     Pupils: Pupils are equal, round, and reactive to light.  Cardiovascular:     Rate and Rhythm: Normal rate and regular rhythm.     Heart sounds: Normal heart sounds.  Pulmonary:     Effort: Pulmonary effort is normal. Tachypnea present. No respiratory distress.     Breath sounds: Examination of the right-lower field reveals rales. Examination of the left-lower field reveals rales. Decreased breath sounds and rales present.  Chest:     Chest wall: No mass or tenderness.  Abdominal:     General: Bowel sounds are normal. There is no distension.     Palpations: Abdomen is soft.     Comments: Obese abdomen, soft and nontender  Musculoskeletal:        General: Normal range of motion.     Cervical back: Normal range of motion.     Right lower leg: Edema present.     Left lower leg: Edema present.     Comments: Trace edema of ankles  Skin:    General: Skin is warm and dry.  Neurological:     General: No focal deficit present.     Mental Status: She is alert.  Psychiatric:        Mood and Affect: Mood normal.        Behavior: Behavior normal.      UC Treatments / Results  Labs (all labs ordered are listed, but only abnormal results are displayed) Labs Reviewed  NOVEL CORONAVIRUS, NAA (HOSP ORDER, SEND-OUT TO REF LAB; TAT 18-24 HRS)  POC SARS CORONAVIRUS 2 AG -  ED  POC SARS CORONAVIRUS 2 AG    EKG unchanged from prior.  Compared to 2020.   Sinus bradycardia.  Occasional PAC.  No ST or T wave changes   Radiology DG Chest 2 View  Result Date: 10/10/2019 CLINICAL DATA:  Shortness of breath. EXAM: CHEST - 2 VIEW COMPARISON:  None. FINDINGS: Mild, chronic appearing increased interstitial lung markings are seen. Very mild atelectasis are and/or early infiltrate is seen within the mid left lung. There is no evidence of a pleural effusion or pneumothorax. The heart size and mediastinal contours are within normal limits. There is mild calcification of the aortic arch. Degenerative changes seen throughout the thoracic spine. IMPRESSION: Chronic appearing increased interstitial lung markings with very mild atelectasis and/or early infiltrate within the mid left lung. Electronically Signed   By: Virgina Norfolk M.D.   On: 10/10/2019 18:05  Chest x-ray is reviewed.  Compared to prior Old records are reviewed  Procedures Procedures (including critical care time)  Medications Ordered in UC Medications - No data to display  Initial Impression / Assessment and Plan / UC Course  I have reviewed the triage vital signs and the nursing notes.  Pertinent labs & imaging results that were available during my care of the patient were reviewed by me and considered in my medical decision making (see chart for details).     With ambulation the patient maintains her oxygenation but she becomes quite tachycardic at 120 with a respiratory rate of 35.  She feels very weak.  She states she will collapse a few tries to walk more than a few feet. She does  have a daughter to help her at home, but her other daughter at home has "double pneumonia". When asked the patient whether she feels well enough to go home she states she really does not, she thinks she needs to be admitted to the hospital.  She will be sent to the emergency room for consideration Final Clinical Impressions(s) / UC Diagnoses   Final diagnoses:  SOB (shortness of breath)  Community  acquired pneumonia of left lower lobe of lung     Discharge Instructions     You have early pneumonia I am concerned about your degree of weakness I believe you need to go to the ER    ED Prescriptions    None     PDMP not reviewed this encounter.   Raylene Everts, MD 10/10/19 Junious Dresser    Raylene Everts, MD 10/10/19 [phone]

## 2019-10-10 NOTE — ED Notes (Signed)
Pt ambulated from wheelchair to xray, short distance. o2 remained higher than 95%, however, HR went up to 120, pt was breathing roughly 35 times a minute and felt very short of breath and fatigued.

## 2019-10-10 NOTE — ED Triage Notes (Signed)
Pt states since Wednesday shes had SOB, CP, fatigue, loss of taste, smell, states she lives with her youngest daughter who was just diagnosed with double pneumonia.

## 2019-10-10 NOTE — ED Triage Notes (Signed)
Pt sent by [facility], diagnosed with pneumonia. C/o shortness of breath and generalized weakness.

## 2019-10-11 ENCOUNTER — Encounter (HOSPITAL_COMMUNITY): Payer: Self-pay | Admitting: Internal Medicine

## 2019-10-11 ENCOUNTER — Inpatient Hospital Stay (HOSPITAL_COMMUNITY): Payer: Medicare Other

## 2019-10-11 DIAGNOSIS — D631 Anemia in chronic kidney disease: Secondary | ICD-10-CM | POA: Diagnosis present

## 2019-10-11 DIAGNOSIS — Z880 Allergy status to penicillin: Secondary | ICD-10-CM | POA: Diagnosis not present

## 2019-10-11 DIAGNOSIS — E662 Morbid (severe) obesity with alveolar hypoventilation: Secondary | ICD-10-CM | POA: Diagnosis present

## 2019-10-11 DIAGNOSIS — R079 Chest pain, unspecified: Secondary | ICD-10-CM

## 2019-10-11 DIAGNOSIS — Z7982 Long term (current) use of aspirin: Secondary | ICD-10-CM | POA: Diagnosis not present

## 2019-10-11 DIAGNOSIS — Z79899 Other long term (current) drug therapy: Secondary | ICD-10-CM | POA: Diagnosis not present

## 2019-10-11 DIAGNOSIS — Z20822 Contact with and (suspected) exposure to covid-19: Secondary | ICD-10-CM | POA: Diagnosis not present

## 2019-10-11 DIAGNOSIS — J1282 Pneumonia due to coronavirus disease 2019: Secondary | ICD-10-CM | POA: Diagnosis present

## 2019-10-11 DIAGNOSIS — I2510 Atherosclerotic heart disease of native coronary artery without angina pectoris: Secondary | ICD-10-CM | POA: Diagnosis present

## 2019-10-11 DIAGNOSIS — E119 Type 2 diabetes mellitus without complications: Secondary | ICD-10-CM

## 2019-10-11 DIAGNOSIS — U071 COVID-19: Secondary | ICD-10-CM | POA: Diagnosis not present

## 2019-10-11 DIAGNOSIS — Z794 Long term (current) use of insulin: Secondary | ICD-10-CM | POA: Diagnosis not present

## 2019-10-11 DIAGNOSIS — K219 Gastro-esophageal reflux disease without esophagitis: Secondary | ICD-10-CM | POA: Diagnosis present

## 2019-10-11 DIAGNOSIS — Z888 Allergy status to other drugs, medicaments and biological substances status: Secondary | ICD-10-CM | POA: Diagnosis not present

## 2019-10-11 DIAGNOSIS — N1832 Chronic kidney disease, stage 3b: Secondary | ICD-10-CM | POA: Diagnosis not present

## 2019-10-11 DIAGNOSIS — N184 Chronic kidney disease, stage 4 (severe): Secondary | ICD-10-CM | POA: Diagnosis present

## 2019-10-11 DIAGNOSIS — E876 Hypokalemia: Secondary | ICD-10-CM | POA: Diagnosis present

## 2019-10-11 DIAGNOSIS — E1122 Type 2 diabetes mellitus with diabetic chronic kidney disease: Secondary | ICD-10-CM | POA: Diagnosis present

## 2019-10-11 DIAGNOSIS — N179 Acute kidney failure, unspecified: Secondary | ICD-10-CM | POA: Diagnosis present

## 2019-10-11 DIAGNOSIS — E785 Hyperlipidemia, unspecified: Secondary | ICD-10-CM | POA: Diagnosis present

## 2019-10-11 DIAGNOSIS — J159 Unspecified bacterial pneumonia: Secondary | ICD-10-CM | POA: Diagnosis present

## 2019-10-11 DIAGNOSIS — Z96651 Presence of right artificial knee joint: Secondary | ICD-10-CM | POA: Diagnosis present

## 2019-10-11 DIAGNOSIS — J189 Pneumonia, unspecified organism: Secondary | ICD-10-CM | POA: Diagnosis present

## 2019-10-11 DIAGNOSIS — Z66 Do not resuscitate: Secondary | ICD-10-CM | POA: Diagnosis present

## 2019-10-11 DIAGNOSIS — Z8249 Family history of ischemic heart disease and other diseases of the circulatory system: Secondary | ICD-10-CM | POA: Diagnosis not present

## 2019-10-11 DIAGNOSIS — Z6841 Body Mass Index (BMI) 40.0 and over, adult: Secondary | ICD-10-CM | POA: Diagnosis not present

## 2019-10-11 DIAGNOSIS — I130 Hypertensive heart and chronic kidney disease with heart failure and stage 1 through stage 4 chronic kidney disease, or unspecified chronic kidney disease: Secondary | ICD-10-CM | POA: Diagnosis present

## 2019-10-11 DIAGNOSIS — J9601 Acute respiratory failure with hypoxia: Secondary | ICD-10-CM | POA: Diagnosis present

## 2019-10-11 DIAGNOSIS — I119 Hypertensive heart disease without heart failure: Secondary | ICD-10-CM | POA: Diagnosis not present

## 2019-10-11 DIAGNOSIS — I519 Heart disease, unspecified: Secondary | ICD-10-CM | POA: Diagnosis not present

## 2019-10-11 DIAGNOSIS — I5032 Chronic diastolic (congestive) heart failure: Secondary | ICD-10-CM | POA: Diagnosis present

## 2019-10-11 LAB — COMPREHENSIVE METABOLIC PANEL
ALT: 16 U/L (ref 0–44)
AST: 29 U/L (ref 15–41)
Albumin: 2.9 g/dL — ABNORMAL LOW (ref 3.5–5.0)
Alkaline Phosphatase: 43 U/L (ref 38–126)
Anion gap: 16 — ABNORMAL HIGH (ref 5–15)
BUN: 40 mg/dL — ABNORMAL HIGH (ref 8–23)
CO2: 27 mmol/L (ref 22–32)
Calcium: 8.8 mg/dL — ABNORMAL LOW (ref 8.9–10.3)
Chloride: 93 mmol/L — ABNORMAL LOW (ref 98–111)
Creatinine, Ser: 2.19 mg/dL — ABNORMAL HIGH (ref 0.44–1.00)
GFR calc Af Amer: 24 mL/min — ABNORMAL LOW (ref >60)
GFR calc non Af Amer: 20 mL/min — ABNORMAL LOW (ref >60)
Glucose, Bld: 162 mg/dL — ABNORMAL HIGH (ref 70–99)
Potassium: 3.4 mmol/L — ABNORMAL LOW (ref 3.5–5.1)
Sodium: 136 mmol/L (ref 135–145)
Total Bilirubin: 0.4 mg/dL (ref 0.3–1.2)
Total Protein: 7.2 g/dL (ref 6.5–8.1)

## 2019-10-11 LAB — CBC WITH DIFFERENTIAL/PLATELET
Abs Immature Granulocytes: 0.05 10*3/uL (ref 0.00–0.07)
Basophils Absolute: 0 10*3/uL (ref 0.0–0.1)
Basophils Relative: 0 %
Eosinophils Absolute: 0 10*3/uL (ref 0.0–0.5)
Eosinophils Relative: 0 %
HCT: 39.3 % (ref 36.0–46.0)
Hemoglobin: 12.6 g/dL (ref 12.0–15.0)
Immature Granulocytes: 1 %
Lymphocytes Relative: 30 %
Lymphs Abs: 1.2 10*3/uL (ref 0.7–4.0)
MCH: 27.5 pg (ref 26.0–34.0)
MCHC: 32.1 g/dL (ref 30.0–36.0)
MCV: 85.6 fL (ref 80.0–100.0)
Monocytes Absolute: 0.5 10*3/uL (ref 0.1–1.0)
Monocytes Relative: 12 %
Neutro Abs: 2.3 10*3/uL (ref 1.7–7.7)
Neutrophils Relative %: 57 %
Platelets: 106 10*3/uL — ABNORMAL LOW (ref 150–400)
RBC: 4.59 MIL/uL (ref 3.87–5.11)
RDW: 13.9 % (ref 11.5–15.5)
WBC: 4.1 10*3/uL (ref 4.0–10.5)
nRBC: 0 % (ref 0.0–0.2)

## 2019-10-11 LAB — C-REACTIVE PROTEIN
CRP: 4.7 mg/dL — ABNORMAL HIGH (ref <1.0)
CRP: 5.4 mg/dL — ABNORMAL HIGH (ref <1.0)

## 2019-10-11 LAB — RESPIRATORY PANEL BY RT PCR (FLU A&B, COVID)
Influenza A by PCR: NEGATIVE
Influenza B by PCR: NEGATIVE
SARS Coronavirus 2 by RT PCR: NEGATIVE

## 2019-10-11 LAB — FERRITIN
Ferritin: 76 ng/mL (ref 11–307)
Ferritin: 76 ng/mL (ref 11–307)

## 2019-10-11 LAB — D-DIMER, QUANTITATIVE: D-Dimer, Quant: 1.55 ug/mL-FEU — ABNORMAL HIGH (ref 0.00–0.50)

## 2019-10-11 LAB — BRAIN NATRIURETIC PEPTIDE: B Natriuretic Peptide: 178.7 pg/mL — ABNORMAL HIGH (ref 0.0–100.0)

## 2019-10-11 LAB — GLUCOSE, CAPILLARY
Glucose-Capillary: 176 mg/dL — ABNORMAL HIGH (ref 70–99)
Glucose-Capillary: 165 mg/dL — ABNORMAL HIGH (ref 70–99)
Glucose-Capillary: 126 mg/dL — ABNORMAL HIGH (ref 70–99)
Glucose-Capillary: 156 mg/dL — ABNORMAL HIGH (ref 70–99)

## 2019-10-11 LAB — TROPONIN I (HIGH SENSITIVITY)
Troponin I (High Sensitivity): 31 ng/L — ABNORMAL HIGH (ref <18)
Troponin I (High Sensitivity): 34 ng/L — ABNORMAL HIGH (ref <18)

## 2019-10-11 LAB — HIV ANTIBODY (ROUTINE TESTING W REFLEX): HIV Screen 4th Generation wRfx: NONREACTIVE

## 2019-10-11 LAB — LACTIC ACID, PLASMA
Lactic Acid, Venous: 1.1 mmol/L (ref 0.5–1.9)
Lactic Acid, Venous: 1.3 mmol/L (ref 0.5–1.9)

## 2019-10-11 LAB — NOVEL CORONAVIRUS, NAA (HOSP ORDER, SEND-OUT TO REF LAB; TAT 18-24 HRS): SARS-CoV-2, NAA: NOT DETECTED

## 2019-10-11 LAB — PROCALCITONIN: Procalcitonin: 0.26 ng/mL

## 2019-10-11 LAB — TRIGLYCERIDES: Triglycerides: 53 mg/dL (ref <150)

## 2019-10-11 LAB — LACTATE DEHYDROGENASE: LDH: 176 U/L (ref 98–192)

## 2019-10-11 LAB — FIBRINOGEN: Fibrinogen: 590 mg/dL — ABNORMAL HIGH (ref 210–475)

## 2019-10-11 MED ORDER — ENOXAPARIN SODIUM 40 MG/0.4ML ~~LOC~~ SOLN: 40.0000 mg | SUBCUTANEOUS | Status: DC

## 2019-10-11 MED ORDER — FUROSEMIDE 20 MG PO TABS
20.0000 mg | ORAL_TABLET | Freq: Every day | ORAL | Status: DC
  Administered 2019-10-11 – 2019-10-17 (×7): 20 mg via ORAL
  Filled 2019-10-11 (×7): qty 1

## 2019-10-11 MED ORDER — ASPIRIN EC 81 MG PO TBEC
81.0000 mg | DELAYED_RELEASE_TABLET | Freq: Every day | ORAL | Status: DC
  Administered 2019-10-11 – 2019-10-17 (×7): 81 mg via ORAL
  Filled 2019-10-11 (×7): qty 1

## 2019-10-11 MED ORDER — PANTOPRAZOLE SODIUM 40 MG PO TBEC
40.0000 mg | DELAYED_RELEASE_TABLET | Freq: Every day | ORAL | Status: DC
  Administered 2019-10-11 – 2019-10-17 (×7): 40 mg via ORAL
  Filled 2019-10-11 (×7): qty 1

## 2019-10-11 MED ORDER — INSULIN ASPART 100 UNIT/ML ~~LOC~~ SOLN
0.0000 [IU] | Freq: Three times a day (TID) | SUBCUTANEOUS | Status: DC
  Administered 2019-10-11 (×2): 2 [IU] via SUBCUTANEOUS
  Administered 2019-10-12: 1 [IU] via SUBCUTANEOUS
  Administered 2019-10-12 (×2): 2 [IU] via SUBCUTANEOUS
  Administered 2019-10-14: 3 [IU] via SUBCUTANEOUS
  Administered 2019-10-14 (×2): 1 [IU] via SUBCUTANEOUS
  Administered 2019-10-15 – 2019-10-16 (×4): 3 [IU] via SUBCUTANEOUS
  Administered 2019-10-16 – 2019-10-17 (×3): 5 [IU] via SUBCUTANEOUS
  Administered 2019-10-17: 09:00:00 3 [IU] via SUBCUTANEOUS

## 2019-10-11 MED ORDER — ENOXAPARIN SODIUM 30 MG/0.3ML ~~LOC~~ SOLN
30.0000 mg | SUBCUTANEOUS | Status: DC
  Administered 2019-10-11 – 2019-10-12 (×2): 30 mg via SUBCUTANEOUS
  Filled 2019-10-11 (×2): qty 0.3

## 2019-10-11 MED ORDER — EZETIMIBE 10 MG PO TABS
10.0000 mg | ORAL_TABLET | Freq: Every evening | ORAL | Status: DC
  Administered 2019-10-11 – 2019-10-16 (×6): 10 mg via ORAL
  Filled 2019-10-11 (×6): qty 1

## 2019-10-11 MED ORDER — ACETAMINOPHEN 650 MG RE SUPP: 650.0000 mg | Freq: Four times a day (QID) | RECTAL | Status: DC | PRN

## 2019-10-11 MED ORDER — DILTIAZEM HCL ER COATED BEADS 240 MG PO CP24
240.0000 mg | ORAL_CAPSULE | Freq: Every day | ORAL | Status: DC
  Administered 2019-10-11 – 2019-10-17 (×7): 240 mg via ORAL
  Filled 2019-10-11 (×5): qty 1
  Filled 2019-10-11 (×2): qty 2
  Filled 2019-10-11: qty 1
  Filled 2019-10-11: qty 2
  Filled 2019-10-11: qty 1

## 2019-10-11 MED ORDER — PRAVASTATIN SODIUM 40 MG PO TABS
40.0000 mg | ORAL_TABLET | Freq: Every day | ORAL | Status: DC
  Administered 2019-10-11 – 2019-10-16 (×6): 40 mg via ORAL
  Filled 2019-10-11 (×7): qty 1

## 2019-10-11 MED ORDER — ONDANSETRON HCL 4 MG PO TABS: 4.0000 mg | ORAL_TABLET | Freq: Four times a day (QID) | ORAL | Status: DC | PRN

## 2019-10-11 MED ORDER — SODIUM CHLORIDE 0.9 % IV SOLN
2.0000 g | INTRAVENOUS | Status: AC
  Administered 2019-10-11 – 2019-10-15 (×5): 2 g via INTRAVENOUS
  Filled 2019-10-11 (×5): qty 20

## 2019-10-11 MED ORDER — NITROGLYCERIN 0.4 MG SL SUBL: 0.4000 mg | SUBLINGUAL_TABLET | SUBLINGUAL | Status: DC | PRN

## 2019-10-11 MED ORDER — SODIUM CHLORIDE 0.9 % IV SOLN
500.0000 mg | INTRAVENOUS | Status: AC
  Administered 2019-10-11 – 2019-10-15 (×5): 500 mg via INTRAVENOUS
  Filled 2019-10-11 (×6): qty 500

## 2019-10-11 MED ORDER — ACETAMINOPHEN 325 MG PO TABS
650.0000 mg | ORAL_TABLET | Freq: Four times a day (QID) | ORAL | Status: DC | PRN
  Administered 2019-10-11 – 2019-10-13 (×4): 650 mg via ORAL
  Filled 2019-10-11 (×4): qty 2

## 2019-10-11 MED ORDER — INSULIN GLARGINE 100 UNIT/ML ~~LOC~~ SOLN
65.0000 [IU] | Freq: Every day | SUBCUTANEOUS | Status: DC
  Administered 2019-10-11 – 2019-10-17 (×7): 65 [IU] via SUBCUTANEOUS
  Filled 2019-10-11 (×7): qty 0.65

## 2019-10-11 MED ORDER — SODIUM CHLORIDE 0.9 % IV BOLUS
500.0000 mL | Freq: Once | INTRAVENOUS | Status: AC
  Administered 2019-10-11: 500 mL via INTRAVENOUS

## 2019-10-11 MED ORDER — HYDRALAZINE HCL 25 MG PO TABS
25.0000 mg | ORAL_TABLET | Freq: Three times a day (TID) | ORAL | Status: DC
  Administered 2019-10-11 – 2019-10-17 (×18): 25 mg via ORAL
  Filled 2019-10-11 (×20): qty 1

## 2019-10-11 MED ORDER — LATANOPROST 0.005 % OP SOLN
1.0000 [drp] | Freq: Every day | OPHTHALMIC | Status: DC
  Administered 2019-10-11 – 2019-10-16 (×6): 1 [drp] via OPHTHALMIC
  Filled 2019-10-11 (×2): qty 2.5

## 2019-10-11 MED ORDER — OXYBUTYNIN CHLORIDE ER 10 MG PO TB24
10.0000 mg | ORAL_TABLET | Freq: Every day | ORAL | Status: DC
  Administered 2019-10-11 – 2019-10-17 (×7): 10 mg via ORAL
  Filled 2019-10-11 (×7): qty 1

## 2019-10-11 MED ORDER — ONDANSETRON HCL 4 MG/2ML IJ SOLN: 4.0000 mg | Freq: Four times a day (QID) | INTRAMUSCULAR | Status: DC | PRN

## 2019-10-11 NOTE — Plan of Care (Signed)
  Problem: Nutrition: Goal: Adequate nutrition will be maintained Outcome: Progressing   

## 2019-10-11 NOTE — H&P (Signed)
History and Physical    JILLAYNE WITTE WUJ:[id number] DOB: December 02, 1935 DOA: 10/10/2019  PCP: Seward Carol, MD  Patient coming from: Home.  Chief Complaint: Shortness of breath fever.  HPI: Jeanne [name] is a 84 y.o. female with history of diabetes mellitus type 2, hypertension, chronic kidney disease stage III, sleep apnea, morbid obesity presents to the ER because of shortness of breath cough and feeling weak over the last 3 to 4 days.  Patient states her daughter was diagnosed with pneumonia 2 days ago and was Covid negative.  Had 1 episode of diarrhea otherwise denies any nausea vomiting.  ED Course: In the ER patient had temperature 100.2 F and chest x-ray was showing infiltrates.  Covid test came back negative patient was mildly hypoxic requiring 2 L oxygen.  Lab work show procalcitonin levels of 0.26 and D-dimer 1.55 BNP of 178.7 hemoglobin 7.9.  Given the symptoms including persistent productive cough with x-ray showing infiltrates patient admitted for pneumonia.  Review of Systems: As per HPI, rest all negative.   Past Medical History:  Diagnosis Date  . Anemia   . Arthritis   . Carotid arterial disease ([place])   . Diabetes mellitus   . Hyperlipidemia   . Hypertension   . Migraine   . Obesity   . Reflux   . Sleep apnea     Past Surgical History:  Procedure Laterality Date  . CARDIAC CATHETERIZATION N/A 10/12/2015   Procedure: Left Heart Cath and Coronary Angiography;  Surgeon: Belva Crome, MD;  Location: [facility];  Service: Cardiovascular;  Laterality: N/A;  . CHOLECYSTECTOMY  50 yrs ago  . JOINT REPLACEMENT Right 2005   knee  . WISDOM TOOTH EXTRACTION       reports that she has never smoked. She has never used smokeless tobacco. She reports that she does not drink alcohol or use drugs.  Allergies  Allergen Reactions  . Amoxicillin Nausea Only    Has patient had a PCN reaction causing immediate rash, facial/tongue/throat swelling, SOB or  lightheadedness with hypotension: yes, i was dizzy Has patient had a PCN reaction causing severe rash involving mucus membranes or skin necrosis: no Did a PCN reaction that required hospitalization : no, called the DR. Did PCN reaction occurring within the last 10 years: unknown- pt cant recall exactly how long ago it was. if all of the above answers are "NO", then may proceed with Cephalosporin use.   [name] [Canagliflozin] Nausea Only and Other (See Comments)    Dizziness     Family History  Problem Relation Age of Onset  . Heart attack Father   . Heart attack Mother     Prior to Admission medications   Medication Sig Start Date End Date Taking? Authorizing Provider  albuterol (PROVENTIL HFA;VENTOLIN HFA) 108 (90 Base) MCG/ACT inhaler Inhale 2 puffs into the lungs every 4 (four) hours as needed for wheezing or shortness of breath. 09/11/16  Yes Melony Overly, MD  aspirin EC 81 MG tablet Take 81 mg by mouth daily.     Yes [provider]  diltiazem (TIAZAC) 240 MG 24 hr capsule Take 1 capsule (240 mg total) by mouth daily. 04/07/19  Yes Despina Hick, MD  ezetimibe (ZETIA) 10 MG tablet Take 10 mg by mouth every evening.     Yes [provider]  furosemide (LASIX) 20 MG tablet TAKE 1 TABLET BY MOUTH EVERY DAY Patient taking differently: Take 20 mg by mouth daily.  07/01/19  Yes Adrian Prows, MD  hydrALAZINE (APRESOLINE) 25 MG tablet Take 1 tablet (25 mg total) by mouth 3 (three) times daily. 04/28/19 10/10/28 Yes Despina Hick, MD  insulin glargine (LANTUS) 100 UNIT/ML injection Inject 65 Units into the skin daily.    Yes [provider]  latanoprost (XALATAN) 0.005 % ophthalmic solution Place 1 drop into the left eye at bedtime. 09/26/15  Yes [provider]  Menthol, Topical Analgesic, (BIOFREEZE EX) Apply 1 application topically daily as needed (back pain).   Yes [provider]  nitroGLYCERIN (NITROSTAT) 0.4 MG SL tablet Place 1 tablet (0.4  mg total) under the tongue as needed for chest pain. 09/08/19  Yes Miquel Dunn, NP  oxybutynin (DITROPAN-XL) 10 MG 24 hr tablet Take 10 mg by mouth daily.     Yes [provider]  pantoprazole (PROTONIX) 40 MG tablet Take 40 mg by mouth daily. 08/25/15  Yes [provider]  Polyvinyl Alcohol-Povidone (REFRESH OP) Place 1 drop into both eyes at bedtime.   Yes [provider]  pravastatin (PRAVACHOL) 40 MG tablet Take 40 mg by mouth daily.    Yes [provider]    Physical Exam: Constitutional: Moderately built and nourished. Vitals:   10/10/19 2308 10/11/19 0352 10/11/19 0355 10/11/19 0405  BP: 131/72 127/65 (!) 118/53 (!) 100/47  Pulse: 63 72 73 61  Resp: 18 (!) 21 (!) 21 20  Temp: 99.2 F (37.3 C)     TempSrc: Oral     SpO2: 100% 100% 100% 100%   Eyes: Anicteric no pallor. ENMT: No discharge from the ears eyes nose or mouth. Neck: No mass felt.  No neck rigidity. Respiratory: No rhonchi or crepitations. Cardiovascular: S1-S2 heard. Abdomen: Soft nontender bowel sounds present. Musculoskeletal: No edema. Skin: No rash. Neurologic: Alert awake oriented to time place and person.  Moves all extremities. Psychiatric: Appears normal per normal affect.   Labs on Admission: I have personally reviewed following labs and imaging studies  CBC: Recent Labs  Lab 10/10/19 1859  WBC 4.3  HGB 11.9*  HCT 37.7  MCV 87.5  PLT 449   Basic Metabolic Panel: Recent Labs  Lab 10/10/19 1859  NA 132*  K 3.5  CL 90*  CO2 28  GLUCOSE 224*  BUN 37*  CREATININE 2.32*  CALCIUM 8.8*   GFR: CrCl cannot be calculated (Unknown ideal weight.). Liver Function Tests: No results for input(s): AST, ALT, ALKPHOS, BILITOT, PROT, ALBUMIN in the last 168 hours. No results for input(s): LIPASE, AMYLASE in the last 168 hours. No results for input(s): AMMONIA in the last 168 hours. Coagulation Profile: No results for input(s): INR, PROTIME in the last 168  hours. Cardiac Enzymes: No results for input(s): CKTOTAL, CKMB, CKMBINDEX, TROPONINI in the last 168 hours. BNP (last 3 results) No results for input(s): PROBNP in the last 8760 hours. HbA1C: No results for input(s): HGBA1C in the last 72 hours. CBG: No results for input(s): GLUCAP in the last 168 hours. Lipid Profile: Recent Labs    10/11/19 0131  TRIG 53   Thyroid Function Tests: No results for input(s): TSH, T4TOTAL, FREET4, T3FREE, THYROIDAB in the last 72 hours. Anemia Panel: Recent Labs    10/11/19 0131  FERRITIN 76   Urine analysis:    Component Value Date/Time   COLORURINE YELLOW 09/11/2011 1525   APPEARANCEUR HAZY (A) 09/11/2011 1525   LABSPEC 1.008 09/11/2011 1525   PHURINE 6.0 09/11/2011 1525   GLUCOSEU NEGATIVE 09/11/2011 1525  HGBUR TRACE (A) 09/11/2011 1525   BILIRUBINUR NEGATIVE 09/11/2011 1525   KETONESUR NEGATIVE 09/11/2011 1525   PROTEINUR NEGATIVE 09/11/2011 1525   UROBILINOGEN 0.2 09/11/2011 1525   NITRITE POSITIVE (A) 09/11/2011 1525   LEUKOCYTESUR SMALL (A) 09/11/2011 1525   Sepsis Labs: @LABRCNTIP (procalcitonin:4,lacticidven:4) ) Recent Results (from the past 240 hour(s))  Respiratory Panel by RT PCR (Flu A&B, Covid) - Nasopharyngeal Swab     Status: None   Collection Time: 10/11/19  1:34 AM   Specimen: Nasopharyngeal Swab  Result Value Ref Range Status   SARS Coronavirus 2 by RT PCR NEGATIVE NEGATIVE Final    Comment: (NOTE) SARS-CoV-2 target nucleic acids are NOT DETECTED. The SARS-CoV-2 RNA is generally detectable in upper respiratoy specimens during the acute phase of infection. The lowest concentration of SARS-CoV-2 viral copies this assay can detect is 131 copies/mL. A negative result does not preclude SARS-Cov-2 infection and should not be used as the sole basis for treatment or other patient management decisions. A negative result may occur with  improper specimen collection/handling, submission of specimen other than  nasopharyngeal swab, presence of viral mutation(s) within the areas targeted by this assay, and inadequate number of viral copies (<131 copies/mL). A negative result must be combined with clinical observations, patient history, and epidemiological information. The expected result is Negative. Fact Sheet for Patients:  PinkCheek.be Fact Sheet for Healthcare Providers:  GravelBags.it This test is not yet ap proved or cleared by the [place] FDA and  has been authorized for detection and/or diagnosis of SARS-CoV-2 by FDA under an Emergency Use Authorization (EUA). This EUA will remain  in effect (meaning this test can be used) for the duration of the COVID-19 declaration under Section 564(b)(1) of the Act, 21 U.S.C. section 360bbb-3(b)(1), unless the authorization is terminated or revoked sooner.    Influenza A by PCR NEGATIVE NEGATIVE Final   Influenza B by PCR NEGATIVE NEGATIVE Final    Comment: (NOTE) The Xpert Xpress SARS-CoV-2/FLU/RSV assay is intended as an aid in  the diagnosis of influenza from Nasopharyngeal swab specimens and  should not be used as a sole basis for treatment. Nasal washings and  aspirates are unacceptable for Xpert Xpress SARS-CoV-2/FLU/RSV  testing. Fact Sheet for Patients: PinkCheek.be Fact Sheet for Healthcare Providers: GravelBags.it This test is not yet approved or cleared by the [place] FDA and  has been authorized for detection and/or diagnosis of SARS-CoV-2 by  FDA under an Emergency Use Authorization (EUA). This EUA will remain  in effect (meaning this test can be used) for the duration of the  Covid-19 declaration under Section 564(b)(1) of the Act, 21  U.S.C. section 360bbb-3(b)(1), unless the authorization is  terminated or revoked. Performed at [facility], [place] [address] [place], [place] [address]       Radiological Exams on Admission: DG Chest 2 View  Result Date: 10/10/2019 CLINICAL DATA:  Shortness of breath. EXAM: CHEST - 2 VIEW COMPARISON:  None. FINDINGS: Mild, chronic appearing increased interstitial lung markings are seen. Very mild atelectasis are and/or early infiltrate is seen within the mid left lung. There is no evidence of a pleural effusion or pneumothorax. The heart size and mediastinal contours are within normal limits. There is mild calcification of the aortic arch. Degenerative changes seen throughout the thoracic spine. IMPRESSION: Chronic appearing increased interstitial lung markings with very mild atelectasis and/or early infiltrate within the mid left lung. Electronically Signed   By: Virgina Norfolk M.D.   On: 10/10/2019 18:05  EKG: Independently reviewed.  Sinus tachycardia.  Assessment/Plan Active Problems:   Chest pain   Diabetes mellitus type II, non insulin dependent ([place])   Hypertension with heart disease   CKD (chronic kidney disease), stage III   Diastolic dysfunction, left ventricle   Acute respiratory failure with hypoxia ([place])   CAP (community acquired pneumonia)    1. Acute respiratory failure with hypoxia likely from community-acquired pneumonia for which I have placed patient on empiric antibiotics ceftriaxone Zithromax follow cultures including blood and sputum.  Check urine for Legionella strep antigen. 2. Chronic diastolic CHF last EF measured was in July 2020 was 60 to 65% on Lasix.  Which will be continued. 3. Diabetes mellitus type 2 on Lantus 60 barriers along with sliding scale coverage. 4. Chronic kidney disease stage III creatinine appears to be at baseline. 5. Anemia likely from chronic kidney disease hemoglobin appears to be at baseline. 6. Sleep apnea on CPAP. 7. Hypertension on Cardizem and hydralazine.  Follow blood pressure trends.  Given the acute respiratory failure with pneumonia will need inpatient status.   DVT  prophylaxis: Lovenox. Code Status: Full code. Family Communication: Discussed with patient. Disposition Plan: Home. Consults called: None. Admission status: Inpatient.   Rise Patience MD [facility] Pager [phone].  If 7PM-7AM, please contact night-coverage [email or web address] Password [facility]  10/11/2019, 5:46 AM

## 2019-10-11 NOTE — Progress Notes (Addendum)
Pt seen and examined, admitted earlier this am by Dr.Kakrakandy -84 year old female with history of super morbid obesity, BMI 57.7, type 2 diabetes mellitus, chronic kidney disease stage 4, OSA/OHS, chronic diastolic CHF, CAD, hypertension, dyslipidemia presented to the emergency room overnight with shortness of breath and fever. -Patient reported shortness of breath, weakness over the last 3 to 4 days, history of feeling feverish as well did not check her temperature at home, was febrile in the emergency room. -Temperature was 100.2, chest x-ray showed questionable infiltrates in addition to chronic findings, Covid point-of-care and PCR was negative, mildly hypoxic requiring 2 L of O2, procalcitonin level was 0.26 and D-dimer was 1.5, BNP of 176.  Acute hypoxic respiratory failure Fever Suspected community-acquired pneumonia -VQ scan done on admission shows low probability of pulmonary embolism -COVID-19 point-of-care antigen and PCR is negative -Check ferritin, CRP -Continue ceftriaxone, azithromycin -History is not suggestive of aspiration  Chronic diastolic CHF - clinically appears euvolemic, continue oral Lasix  Mildly elevated high-sensitivity troponin -Likely secondary to fever, pneumonia no clinical evidence of ACS at this time -EKG with sinus tachycardia, no acute ST-T wave changes -Left heart cath in 2017 noted nonobstructive coronary disease, medical management was recommended   Type 2 diabetes mellitus -Continue Lantus and sliding scale   Super morbid obesity -BMI is 57.7 -Lifestyle modification, weight loss advised   Chronic kidney disease stage IV -Creatinine stable at baseline -Followed by [facility]   History of monoclonal gammopathy of unknown significance -Recently had an abnormal serum electrophoresis which showed M spike of 0.6 g and immunofixation showed biclonal moderate clonal protein, was seen by oncology recently not felt to have multiple  myeloma at this time   Obstructive sleep apnea -Continue CPAP nightly    Anemia of chronic disease -Hemoglobin stable at baseline  DVT prophylaxis: Lovenox CODE STATUS discussed with the patient again today, she wishes to be DNR Family communication, none baseline discussed the patient Disposition Home pending improvement in fever, respiratory failure  Domenic Polite, MD

## 2019-10-11 NOTE — Plan of Care (Signed)
  Problem: Education: Goal: Knowledge of General Education information will improve Description: Including pain rating scale, medication(s)/side effects and non-pharmacologic comfort measures Outcome: Progressing   Problem: Clinical Measurements: Goal: Respiratory complications will improve Outcome: Progressing   

## 2019-10-11 NOTE — Progress Notes (Signed)
PT Cancellation Note  Patient Details Name: Jeanne [name] MRN: [id number] DOB: Jan 27, 1936   Cancelled Treatment:    Reason Eval/Treat Not Completed: Fatigue/lethargy limiting ability to participate.  Newly arrived from ED with fever and lethargic, declined PT.  Try again as time and pt allow.   Ramond Dial 10/11/2019, 1:57 PM  Mee Hives, PT MS Acute Rehab Dept. Number: [place] and [place]

## 2019-10-11 NOTE — ED Notes (Signed)
Pt came to the ED per triage complaint. Pt conscious, breathing, and A&Ox4. Pt brought back to [address] via wheelchair. Pt endorses "I was at urgent care when they said I might have pneumonia and was told to come to the ER". Chest rise and fall equally with non-labored breathing. Lungs clear apex to base. Abd soft and non-tender. Pt denies chest pain, n/v/d, and f/c. PIVC placed on the RAC with a 20G which had positive blood return and flushed without pain or infiltration. Bed in lowest position with call light within reach. Pt on continuous blood pressure, pulse ox, and cardiac monitor. Will continue to monitor. Awaiting MD eval. No distress noted.

## 2019-10-11 NOTE — Progress Notes (Signed)
Notified MD Broadus John via secure chat that oral temp. was 102.7. Awaiting response.   Paulla Fore, RN, BSN.

## 2019-10-11 NOTE — ED Provider Notes (Signed)
[facility] EMERGENCY DEPARTMENT Provider Note   CSN: [id number] Arrival date & time: 10/10/19  1833     History Chief Complaint  Patient presents with  . Weakness    Jeanne [name] is a 84 y.o. female with a history of OSA, obesity, HDL, HTN, carotid arterial disease, anemia, CKD stage III, CAD who presents to the emergency department with a chief complaint of shortness of breath.  The patient reports that she has had shortness of breath, fatigue, generalized weakness, chest pain and pressure, body aches, productive cough with thick, white sputum, and paresthesias in her bilateral fingers for the last 3 days.  She characterizes as "numbness"  In her bilateral chest.  Pain has been constant since onset.  Pain has not been worsening.  She has had no appetite.  Reports she may have had an orange and a cup of water today.  She also developed diarrhea today.  She denies fever, chills, vomiting, palpitations, leg swelling, headache, dizziness, lightheadedness, abdominal pain, numbness, or focal weakness, rashes, or dysuria.   The patient lives with her 2 daughters.  I spoke with the patient's daughter Jeanne [name] who reports that her sister was diagnosed with "double pneumonia" and has been ill with URI symptoms for over a week.  Jeanne [name] is unsure of her sister has been tested for Covid. Jeanne [name] reports that after speaking with her sister that they both decided to go to urgent care today for further evaluation.The patient had her first Covid vaccine on January 24.  The patient's daughter reports that her mother has become profoundly weak over the last couple days and is needing assistance with ambulation.  Jeanne [name] was evaluated in Emergency Department on 10/11/2019 for the symptoms described in the history of present illness. She was evaluated in the context of the global COVID-19 pandemic, which necessitated consideration that the patient might be at risk for  infection with the SARS-CoV-2 virus that causes COVID-19. Institutional protocols and algorithms that pertain to the evaluation of patients at risk for COVID-19 are in a state of rapid change based on information released by regulatory bodies including the CDC and federal and state organizations. These policies and algorithms were followed during the patient's care in the ED.  The history is provided by the patient. No language interpreter was used.       Past Medical History:  Diagnosis Date  . Anemia   . Arthritis   . Carotid arterial disease ([place])   . Diabetes mellitus   . Hyperlipidemia   . Hypertension   . Migraine   . Obesity   . Reflux   . Sleep apnea     Patient Active Problem List   Diagnosis Date Noted  . Acute respiratory failure with hypoxia ([place]) 10/11/2019  . CAP (community acquired pneumonia) 10/11/2019  . Diastolic dysfunction, left ventricle 04/05/2019  . Atherosclerosis of native coronary artery of native heart without angina pectoris 11/23/2018  . Hypercholesteremia 11/23/2018  . Unstable angina ([place]) 10/12/2015  . Abnormal myocardial perfusion study 10/12/2015  . NSVT (nonsustained ventricular tachycardia) ([place]) 10/11/2015  . Hypertension with heart disease 10/11/2015  . CKD (chronic kidney disease), stage III 10/11/2015  . Pain in the chest   . Chest pain 10/08/2015  . Diabetes mellitus type II, non insulin dependent ([place]) 10/08/2015  . AKI (acute kidney injury) ([place])   . Abscess of left arm 01/01/2013  . Migraine   . Morbid obesity ([place])   . Sleep apnea   .  Stenosis of left carotid artery   . Reflux   . Anemia     Past Surgical History:  Procedure Laterality Date  . CARDIAC CATHETERIZATION N/A 10/12/2015   Procedure: Left Heart Cath and Coronary Angiography;  Surgeon: Belva Crome, MD;  Location: [facility];  Service: Cardiovascular;  Laterality: N/A;  . CHOLECYSTECTOMY  50 yrs ago  . JOINT REPLACEMENT Right 2005   knee  . WISDOM TOOTH  EXTRACTION       OB History   No obstetric history on file.     Family History  Problem Relation Age of Onset  . Heart attack Father   . Heart attack Mother     Social History   Tobacco Use  . Smoking status: Never Smoker  . Smokeless tobacco: Never Used  Substance Use Topics  . Alcohol use: Never  . Drug use: No    Home Medications Prior to Admission medications   Medication Sig Start Date End Date Taking? Authorizing Provider  albuterol (PROVENTIL HFA;VENTOLIN HFA) 108 (90 Base) MCG/ACT inhaler Inhale 2 puffs into the lungs every 4 (four) hours as needed for wheezing or shortness of breath. 09/11/16  Yes Melony Overly, MD  aspirin EC 81 MG tablet Take 81 mg by mouth daily.     Yes [provider]  diltiazem (TIAZAC) 240 MG 24 hr capsule Take 1 capsule (240 mg total) by mouth daily. 04/07/19  Yes Despina Hick, MD  ezetimibe (ZETIA) 10 MG tablet Take 10 mg by mouth every evening.     Yes [provider]  furosemide (LASIX) 20 MG tablet TAKE 1 TABLET BY MOUTH EVERY DAY Patient taking differently: Take 20 mg by mouth daily.  07/01/19  Yes Adrian Prows, MD  hydrALAZINE (APRESOLINE) 25 MG tablet Take 1 tablet (25 mg total) by mouth 3 (three) times daily. 04/28/19 10/10/28 Yes Despina Hick, MD  insulin glargine (LANTUS) 100 UNIT/ML injection Inject 65 Units into the skin daily.    Yes [provider]  latanoprost (XALATAN) 0.005 % ophthalmic solution Place 1 drop into the left eye at bedtime. 09/26/15  Yes [provider]  Menthol, Topical Analgesic, (BIOFREEZE EX) Apply 1 application topically daily as needed (back pain).   Yes [provider]  nitroGLYCERIN (NITROSTAT) 0.4 MG SL tablet Place 1 tablet (0.4 mg total) under the tongue as needed for chest pain. 09/08/19  Yes Miquel Dunn, NP  oxybutynin (DITROPAN-XL) 10 MG 24 hr tablet Take 10 mg by mouth daily.     Yes [provider]  pantoprazole (PROTONIX) 40 MG tablet Take  40 mg by mouth daily. 08/25/15  Yes [provider]  Polyvinyl Alcohol-Povidone (REFRESH OP) Place 1 drop into both eyes at bedtime.   Yes [provider]  pravastatin (PRAVACHOL) 40 MG tablet Take 40 mg by mouth daily.    Yes [provider]    Allergies    Amoxicillin and Invokana [canagliflozin]  Review of Systems   Review of Systems  Constitutional: Positive for appetite change and fatigue. Negative for activity change, chills, diaphoresis and fever.  Respiratory: Positive for cough and shortness of breath. Negative for wheezing.   Cardiovascular: Positive for chest pain. Negative for palpitations and leg swelling.  Gastrointestinal: Positive for diarrhea and nausea. Negative for abdominal pain, blood in stool and vomiting.  Genitourinary: Negative for dysuria, flank pain and urgency.  Musculoskeletal: Negative for back pain, myalgias, neck pain and neck stiffness.  Skin: Negative for  rash.  Allergic/Immunologic: Negative for immunocompromised state.  Neurological: Positive for weakness (generalized). Negative for seizures, syncope, numbness and headaches.       Paresthesias in the bilateral fingers  Psychiatric/Behavioral: Negative for confusion.    Physical Exam Updated Vital Signs BP (!) 100/47   Pulse 61   Temp 99.2 F (37.3 C) (Oral)   Resp 20   Ht 5\' 3"  (1.6 m)   Wt (!) 147.9 kg   SpO2 100%   BMI 57.76 kg/m   Physical Exam Vitals and nursing note reviewed.  Constitutional:      General: She is not in acute distress.    Appearance: She is obese. She is not toxic-appearing or diaphoretic.  HENT:     Head: Normocephalic.  Eyes:     Conjunctiva/sclera: Conjunctivae normal.  Cardiovascular:     Rate and Rhythm: Normal rate and regular rhythm.     Heart sounds: No murmur. No friction rub. No gallop.   Pulmonary:     Effort: Pulmonary effort is normal. No respiratory distress.  Abdominal:     General: There is no distension.      Palpations: Abdomen is soft.  Musculoskeletal:     Cervical back: Neck supple.  Skin:    General: Skin is warm.     Findings: No rash.  Neurological:     Mental Status: She is alert.  Psychiatric:        Behavior: Behavior normal.     ED Results / Procedures / Treatments   Labs (all labs ordered are listed, but only abnormal results are displayed) Labs Reviewed  BASIC METABOLIC PANEL - Abnormal; Notable for the following components:      Result Value   Sodium 132 (*)    Chloride 90 (*)    Glucose, Bld 224 (*)    BUN 37 (*)    Creatinine, Ser 2.32 (*)    Calcium 8.8 (*)    GFR calc non Af Amer 19 (*)    GFR calc Af Amer 22 (*)    All other components within normal limits  CBC - Abnormal; Notable for the following components:   Hemoglobin 11.9 (*)    All other components within normal limits  BRAIN NATRIURETIC PEPTIDE - Abnormal; Notable for the following components:   B Natriuretic Peptide 178.7 (*)    All other components within normal limits  D-DIMER, QUANTITATIVE (NOT AT [facility]) - Abnormal; Notable for the following components:   D-Dimer, Quant 1.55 (*)    All other components within normal limits  FIBRINOGEN - Abnormal; Notable for the following components:   Fibrinogen 590 (*)    All other components within normal limits  C-REACTIVE PROTEIN - Abnormal; Notable for the following components:   CRP 4.7 (*)    All other components within normal limits  COMPREHENSIVE METABOLIC PANEL - Abnormal; Notable for the following components:   Potassium 3.4 (*)    Chloride 93 (*)    Glucose, Bld 162 (*)    BUN 40 (*)    Creatinine, Ser 2.19 (*)    Calcium 8.8 (*)    Albumin 2.9 (*)    GFR calc non Af Amer 20 (*)    GFR calc Af Amer 24 (*)    Anion gap 16 (*)    All other components within normal limits  CBC WITH DIFFERENTIAL/PLATELET - Abnormal; Notable for the following components:   Platelets 106 (*)    All other components within normal limits  GLUCOSE, CAPILLARY -  Abnormal; Notable for the following components:   Glucose-Capillary 176 (*)    All other components within normal limits  TROPONIN I (HIGH SENSITIVITY) - Abnormal; Notable for the following components:   Troponin I (High Sensitivity) 31 (*)    All other components within normal limits  TROPONIN I (HIGH SENSITIVITY) - Abnormal; Notable for the following components:   Troponin I (High Sensitivity) 34 (*)    All other components within normal limits  RESPIRATORY PANEL BY RT PCR (FLU A&B, COVID)  CULTURE, BLOOD (ROUTINE X 2)  CULTURE, BLOOD (ROUTINE X 2)  EXPECTORATED SPUTUM ASSESSMENT W REFEX TO RESP CULTURE  LACTIC ACID, PLASMA  LACTIC ACID, PLASMA  PROCALCITONIN  LACTATE DEHYDROGENASE  FERRITIN  TRIGLYCERIDES  URINALYSIS, ROUTINE W REFLEX MICROSCOPIC  HIV ANTIBODY (ROUTINE TESTING W REFLEX)  CBC    EKG EKG Interpretation  Date/Time:  Friday October 10 2019 18:48:44 EST Ventricular Rate:  91 PR Interval:  140 QRS Duration: 74 QT Interval:  352 QTC Calculation: 432 R Axis:   -74 Text Interpretation: Sinus rhythm with Premature atrial complexes with Abberant conduction Left axis deviation Low voltage QRS Cannot rule out Anterior infarct , age undetermined Abnormal ECG Confirmed by Gerlene Fee [phone]) on 10/11/2019 2:07:13 AM   Radiology DG Chest 2 View  Result Date: 10/10/2019 CLINICAL DATA:  Shortness of breath. EXAM: CHEST - 2 VIEW COMPARISON:  None. FINDINGS: Mild, chronic appearing increased interstitial lung markings are seen. Very mild atelectasis are and/or early infiltrate is seen within the mid left lung. There is no evidence of a pleural effusion or pneumothorax. The heart size and mediastinal contours are within normal limits. There is mild calcification of the aortic arch. Degenerative changes seen throughout the thoracic spine. IMPRESSION: Chronic appearing increased interstitial lung markings with very mild atelectasis and/or early infiltrate within the mid left  lung. Electronically Signed   By: Virgina Norfolk M.D.   On: 10/10/2019 18:05    Procedures .Critical Care Performed by: Joanne Gavel, PA-C Authorized by: Joanne Gavel, PA-C   Critical care provider statement:    Critical care time (minutes):  40   Critical care time was exclusive of:  Separately billable procedures and treating other patients and teaching time   Critical care was necessary to treat or prevent imminent or life-threatening deterioration of the following conditions:  Respiratory failure   Critical care was time spent personally by me on the following activities:  Ordering and performing treatments and interventions, ordering and review of laboratory studies, ordering and review of radiographic studies, examination of patient, re-evaluation of patient's condition, review of old charts and obtaining history from patient or surrogate   I assumed direction of critical care for this patient from another provider in my specialty: no     (including critical care time)  Medications Ordered in ED Medications  sodium chloride flush (NS) 0.9 % injection 3 mL (3 mLs Intravenous Not Given 10/11/19 0120)  aspirin EC tablet 81 mg (has no administration in time range)  diltiazem (CARDIZEM CD) 24 hr capsule 240 mg (has no administration in time range)  ezetimibe (ZETIA) tablet 10 mg (has no administration in time range)  furosemide (LASIX) tablet 20 mg (has no administration in time range)  hydrALAZINE (APRESOLINE) tablet 25 mg (has no administration in time range)  nitroGLYCERIN (NITROSTAT) SL tablet 0.4 mg (has no administration in time range)  pravastatin (PRAVACHOL) tablet 40 mg (has no administration in time range)  insulin glargine (LANTUS) injection 65 Units (  has no administration in time range)  pantoprazole (PROTONIX) EC tablet 40 mg (has no administration in time range)  oxybutynin (DITROPAN-XL) 24 hr tablet 10 mg (has no administration in time range)  latanoprost  (XALATAN) 0.005 % ophthalmic solution 1 drop (has no administration in time range)  acetaminophen (TYLENOL) tablet 650 mg (has no administration in time range)    Or  acetaminophen (TYLENOL) suppository 650 mg (has no administration in time range)  ondansetron (ZOFRAN) tablet 4 mg (has no administration in time range)    Or  ondansetron (ZOFRAN) injection 4 mg (has no administration in time range)  cefTRIAXone (ROCEPHIN) 2 g in sodium chloride 0.9 % 100 mL IVPB (2 g Intravenous New Bag/Given 10/11/19 0637)  azithromycin (ZITHROMAX) 500 mg in sodium chloride 0.9 % 250 mL IVPB (500 mg Intravenous New Bag/Given 10/11/19 0837)  insulin aspart (novoLOG) injection 0-9 Units (2 Units Subcutaneous Given 10/11/19 0836)  enoxaparin (LOVENOX) injection 40 mg (has no administration in time range)  sodium chloride 0.9 % bolus 500 mL (0 mLs Intravenous Stopped 10/11/19 0139)    ED Course  I have reviewed the triage vital signs and the nursing notes.  Pertinent labs & imaging results that were available during my care of the patient were reviewed by me and considered in my medical decision making (see chart for details).    MDM Rules/Calculators/A&P                      84 year old female history of OSA, obesity, HDL, HTN, carotid arterial disease, anemia, CKD stage III, CAD who presents to the emergency department from urgent care with shortness of breath, chest pain, productive cough with white sputum, generalized weakness, fatigue, and diarrhea that has developed over the last 3 days.  She lives with her daughter who was diagnosed with "double pneumonia" and has been having URI symptoms for the last week.  Family is unsure if the patient's daughter has undergone a Covid test.  I spoke with the patient's daughter Jeanne [name], who reports that given the degree of weakness that she has had over the last few days that she has now been requiring assistance to ambulate.  RN notes that while the patient was in triage and  was ambulated from the wheelchair to x-ray that the patient did not become hypoxic, but with ambulation of minimal distance the patient became very fatigued, heart rate significantly increased to 120, and patient's respiration rate increased to 35.  She was endorsing shortness of breath and feeling very fatigued.  Chest x-ray performed at urgent care today has been reviewed by me.  There are some areas of chronic appearing interstitial lung markings.  There is also concern for an early infiltrate in the mid left lung.  Labs are also notable for mild hyponatremia.  Creatinine is minimally increased from previous.  She has a new leukopenia.  She does not meet sepsis criteria and no antibiotics have been initiated at this time.  She did have an oral temp of 100.3 on arrival, but when I repeated her temp in the room she was 99.1 orally.  No reported fever or chills at home.  She had a negative point-of-care test at urgent care today.  PCR test is pending.  She is having chest pain.  EKG was sinus rhythm with PACs and cannot rule out anterior infarct.  However, given her infectious symptoms, I strongly suspect that she has COVID-19.  She does have a history of CAD, but  she does not appear hypervolemic.  BNP is minimally elevated.  Doubt acute CHF exacerbation.  The patient was seen and independently evaluated by Dr. Sedonia Small, attending physician.   Consult to the hospitalist team and Dr. Hal Hope will accept the patient for admission.  Preadmission Covid labs have been ordered and are pending.  The patient appears reasonably stabilized for admission considering the current resources, flow, and capabilities available in the ED at this time, and I doubt any other [facility] requiring further screening and/or treatment in the ED prior to admission.  Final Clinical Impression(s) / ED Diagnoses Final diagnoses:  Suspected COVID-19 virus infection  Acute respiratory failure with hypoxia [facility])    Rx / DC Orders ED  Discharge Orders    None       Joanne Gavel, PA-C 10/11/19 0855    Maudie Flakes, MD 10/16/19 [phone]

## 2019-10-11 NOTE — Progress Notes (Signed)
Notified MD Broadus John via secure chat of oral temp 101.89F. Administered Tylenol PO per PRN orders.  MD responded that she would order blood cultures.   Paulla Fore, RN, BSN

## 2019-10-11 NOTE — ED Notes (Signed)
[phone] pts daughter [name] call with an update

## 2019-10-11 NOTE — Progress Notes (Signed)
New Admission Note: ? Arrival Method: via w/c Mental Orientation: A/O x 4 Telemetry: Box #09 NSR Assessment: Completed Skin: Refer to flowsheet IV: Right AC NSL Pain: 0/10 Tubes: Safety Measures: Safety Fall Prevention Plan discussed with patient. Admission: Completed [facility] [place] Orientation: Patient has been orientated to the room, unit and the staff. Family: Orders have been reviewed and are being implemented. Will continue to monitor the patient. Call light has been placed within reach and bed alarm has been activated.  ? [employer], [place]

## 2019-10-11 NOTE — ED Notes (Signed)
Pt resting in bed. Pt denies new or worsening complaints. Will continue to monitor. No distress noted. Pt on continuous monitoring via blood pressure, pulse ox, and cardiac monitor.  

## 2019-10-11 NOTE — Progress Notes (Signed)
Pt. Refused cpap. 

## 2019-10-12 ENCOUNTER — Inpatient Hospital Stay (HOSPITAL_COMMUNITY): Payer: Medicare Other

## 2019-10-12 DIAGNOSIS — N1832 Chronic kidney disease, stage 3b: Secondary | ICD-10-CM

## 2019-10-12 LAB — URINALYSIS, ROUTINE W REFLEX MICROSCOPIC
Bilirubin Urine: NEGATIVE
Glucose, UA: NEGATIVE mg/dL
Hgb urine dipstick: NEGATIVE
Ketones, ur: NEGATIVE mg/dL
Leukocytes,Ua: NEGATIVE
Nitrite: NEGATIVE
Protein, ur: 100 mg/dL — AB
Specific Gravity, Urine: 1.016 (ref 1.005–1.030)
pH: 5 (ref 5.0–8.0)

## 2019-10-12 LAB — BASIC METABOLIC PANEL
Anion gap: 15 (ref 5–15)
BUN: 37 mg/dL — ABNORMAL HIGH (ref 8–23)
CO2: 27 mmol/L (ref 22–32)
Calcium: 8.4 mg/dL — ABNORMAL LOW (ref 8.9–10.3)
Chloride: 91 mmol/L — ABNORMAL LOW (ref 98–111)
Creatinine, Ser: 2.04 mg/dL — ABNORMAL HIGH (ref 0.44–1.00)
GFR calc Af Amer: 26 mL/min — ABNORMAL LOW (ref >60)
GFR calc non Af Amer: 22 mL/min — ABNORMAL LOW (ref >60)
Glucose, Bld: 160 mg/dL — ABNORMAL HIGH (ref 70–99)
Potassium: 3.1 mmol/L — ABNORMAL LOW (ref 3.5–5.1)
Sodium: 133 mmol/L — ABNORMAL LOW (ref 135–145)

## 2019-10-12 LAB — GLUCOSE, CAPILLARY
Glucose-Capillary: 149 mg/dL — ABNORMAL HIGH (ref 70–99)
Glucose-Capillary: 158 mg/dL — ABNORMAL HIGH (ref 70–99)
Glucose-Capillary: 184 mg/dL — ABNORMAL HIGH (ref 70–99)
Glucose-Capillary: 138 mg/dL — ABNORMAL HIGH (ref 70–99)

## 2019-10-12 LAB — HEPATIC FUNCTION PANEL
ALT: 17 U/L (ref 0–44)
AST: 37 U/L (ref 15–41)
Albumin: 2.7 g/dL — ABNORMAL LOW (ref 3.5–5.0)
Alkaline Phosphatase: 40 U/L (ref 38–126)
Bilirubin, Direct: 0.2 mg/dL (ref 0.0–0.2)
Indirect Bilirubin: 0.3 mg/dL (ref 0.3–0.9)
Total Bilirubin: 0.5 mg/dL (ref 0.3–1.2)
Total Protein: 7 g/dL (ref 6.5–8.1)

## 2019-10-12 LAB — CBC
HCT: 36 % (ref 36.0–46.0)
Hemoglobin: 11.7 g/dL — ABNORMAL LOW (ref 12.0–15.0)
MCH: 27.3 pg (ref 26.0–34.0)
MCHC: 32.5 g/dL (ref 30.0–36.0)
MCV: 84.1 fL (ref 80.0–100.0)
Platelets: 128 10*3/uL — ABNORMAL LOW (ref 150–400)
RBC: 4.28 MIL/uL (ref 3.87–5.11)
RDW: 13.8 % (ref 11.5–15.5)
WBC: 4.7 10*3/uL (ref 4.0–10.5)
nRBC: 0 % (ref 0.0–0.2)

## 2019-10-12 MED ORDER — POTASSIUM CHLORIDE CRYS ER 20 MEQ PO TBCR
40.0000 meq | EXTENDED_RELEASE_TABLET | Freq: Once | ORAL | Status: AC
  Administered 2019-10-12: 40 meq via ORAL
  Filled 2019-10-12: qty 2

## 2019-10-12 NOTE — Progress Notes (Signed)
PROGRESS NOTE    Jeanne [name]  IOM:[id number] DOB: [birth year] DOA: 10/10/2019 PCP: Seward Carol, MD  Brief Narrative: 84 year old female with history of super morbid obesity, BMI 57.7, type 2 diabetes mellitus, chronic kidney disease stage 4, OSA/OHS, chronic diastolic CHF, CAD, hypertension, dyslipidemia presented to the emergency room overnight with shortness of breath and fever. -Patient reported shortness of breath, weakness over the last 3 to 4 days, history of feeling feverish as well did not check her temperature at home, was febrile in the emergency room. -Temperature was 100.2, chest x-ray showed questionable infiltrates in addition to chronic findings, Covid point-of-care and PCR was negative, mildly hypoxic requiring 2 L of O2, procalcitonin level was 0.26 and D-dimer was 1.5, BNP of 176   Assessment & Plan:   Acute hypoxic respiratory failure Fever Suspected community-acquired pneumonia -VQ scan done on admission shows low probability of pulmonary embolism -COVID-19 point-of-care antigen and PCR is negative -CRP mildly elevated, ferritin is normal -Continue ceftriaxone, azithromycin -History is not suggestive of aspiration -Fever of 102 yesterday, blood cultures repeated, negative x1 day -Repeat chest x-ray today, wean O2 as tolerated  Chronic diastolic CHF - clinically appears euvolemic, continue oral Lasix  Mildly elevated high-sensitivity troponin -Likely secondary to fever, pneumonia no clinical evidence of ACS at this time -EKG with sinus tachycardia, no acute ST-T wave changes -Left heart cath in 2017 noted nonobstructive coronary disease, medical management was recommended   Type 2 diabetes mellitus -Continue Lantus and sliding scale   Super morbid obesity -BMI is 57.7 -Lifestyle modification, weight loss advised   Chronic kidney disease stage IV -Creatinine stable at baseline -Followed by [facility]   History of monoclonal  gammopathy of unknown significance -Recently had an abnormal serum electrophoresis which showed M spike of 0.6 g and immunofixation showed biclonal moderate clonal protein, was seen by oncology recently not felt to have multiple myeloma at this time   Obstructive sleep apnea -Continue CPAP nightly    Anemia of chronic disease -Hemoglobin stable at baseline  DVT prophylaxis: Lovenox CODE STATUS discussed with the patient 2/6, she wishes to be DNR Family communication, none baseline discussed the patient Disposition Home pending improvement in fever, respiratory failure     Procedures:   Antimicrobials:    Subjective: -Feels a little better but still tired, mild cough, low-grade fever this morning  Objective: Vitals:   10/11/19 2230 10/12/19 0505 10/12/19 0617 10/12/19 0851  BP:  (!) 157/76  132/70  Pulse:  (!) 46 88 83  Resp:  16  18  Temp:  98.5 F (36.9 C)  (!) 100.7 F (38.2 C)  TempSrc:  Oral  Oral  SpO2:  96%  99%  Weight: (!) 146.1 kg     Height:        Intake/Output Summary (Last 24 hours) at 10/12/2019 1419 Last data filed at 10/12/2019 1236 Gross per 24 hour  Intake 1470 ml  Output 450 ml  Net 1020 ml   Filed Weights   10/11/19 0507 10/11/19 2230  Weight: (!) 147.9 kg (!) 146.1 kg    Examination:  General exam: Morbidly obese pleasant female sitting up in bed, AAO x3, no distress Respiratory system: Distant breath sounds, diminished in both bases Cardiovascular system: S1 & S2 heard, RRR. Gastrointestinal system: Abdomen is nondistended, soft and nontender.Normal bowel sounds heard. Central nervous system: Alert and oriented. No focal neurological deficits. Extremities: No edema Skin: No rashes, lesions or ulcers Psychiatry: Judgement and insight appear normal. Mood &  affect appropriate.     Data Reviewed:   CBC: Recent Labs  Lab 10/10/19 1859 10/11/19 0738 10/12/19 0331  WBC 4.3 4.1 4.7  NEUTROABS  --  2.3  --   HGB 11.9* 12.6 11.7*    HCT 37.7 39.3 36.0  MCV 87.5 85.6 84.1  PLT 152 106* 620*   Basic Metabolic Panel: Recent Labs  Lab 10/10/19 1859 10/11/19 0738 10/12/19 0331  NA 132* 136 133*  K 3.5 3.4* 3.1*  CL 90* 93* 91*  CO2 '28 27 27  ' GLUCOSE 224* 162* 160*  BUN 37* 40* 37*  CREATININE 2.32* 2.19* 2.04*  CALCIUM 8.8* 8.8* 8.4*   GFR: Estimated Creatinine Clearance: 30.7 mL/min (A) (by C-G formula based on SCr of 2.04 mg/dL (H)). Liver Function Tests: Recent Labs  Lab 10/11/19 0738 10/12/19 0331  AST 29 37  ALT 16 17  ALKPHOS 43 40  BILITOT 0.4 0.5  PROT 7.2 7.0  ALBUMIN 2.9* 2.7*   No results for input(s): LIPASE, AMYLASE in the last 168 hours. No results for input(s): AMMONIA in the last 168 hours. Coagulation Profile: No results for input(s): INR, PROTIME in the last 168 hours. Cardiac Enzymes: No results for input(s): CKTOTAL, CKMB, CKMBINDEX, TROPONINI in the last 168 hours. BNP (last 3 results) No results for input(s): PROBNP in the last 8760 hours. HbA1C: No results for input(s): HGBA1C in the last 72 hours. CBG: Recent Labs  Lab 10/11/19 1113 10/11/19 1624 10/11/19 2046 10/12/19 0805 10/12/19 1121  GLUCAP 165* 126* 156* 149* 158*   Lipid Profile: Recent Labs    10/11/19 0131  TRIG 53   Thyroid Function Tests: No results for input(s): TSH, T4TOTAL, FREET4, T3FREE, THYROIDAB in the last 72 hours. Anemia Panel: Recent Labs    10/11/19 0131 10/11/19 1156  FERRITIN 76 76   Urine analysis:    Component Value Date/Time   COLORURINE YELLOW 09/11/2011 1525   APPEARANCEUR HAZY (A) 09/11/2011 1525   LABSPEC 1.008 09/11/2011 1525   PHURINE 6.0 09/11/2011 1525   GLUCOSEU NEGATIVE 09/11/2011 1525   HGBUR TRACE (A) 09/11/2011 1525   BILIRUBINUR NEGATIVE 09/11/2011 1525   KETONESUR NEGATIVE 09/11/2011 1525   PROTEINUR NEGATIVE 09/11/2011 1525   UROBILINOGEN 0.2 09/11/2011 1525   NITRITE POSITIVE (A) 09/11/2011 1525   LEUKOCYTESUR SMALL (A) 09/11/2011 1525   Sepsis  Labs: '@LABRCNTIP' (procalcitonin:4,lacticidven:4)  ) Recent Results (from the past 240 hour(s))  Novel Coronavirus, NAA (Hosp order, Send-out to [facility]; TAT 18-24 hrs     Status: None   Collection Time: 10/10/19  5:20 PM   Specimen: Nasopharyngeal Swab; Respiratory  Result Value Ref Range Status   SARS-CoV-2, NAA NOT DETECTED NOT DETECTED Final    Comment: (NOTE) This nucleic acid amplification test was developed and its performance characteristics determined by [employer]. Nucleic acid amplification tests include RT-PCR and TMA. This test has not been FDA cleared or approved. This test has been authorized by FDA under an Emergency Use Authorization (EUA). This test is only authorized for the duration of time the declaration that circumstances exist justifying the authorization of the emergency use of in vitro diagnostic tests for detection of SARS-CoV-2 virus and/or diagnosis of COVID-19 infection under section 564(b)(1) of the Act, 21 U.S.C. 355HRC-1(U) (1), unless the authorization is terminated or revoked sooner. When diagnostic testing is negative, the possibility of a false negative result should be considered in the context of a patient's recent exposures and the presence of clinical signs and symptoms consistent with COVID-19.  An individual without symptoms of COVID- 19 and who is not shedding SARS-CoV-2  virus would expect to have a negative (not detected) result in this assay. Performed At: [facility] [address] [place], [place] [id number] Rush Farmer MD FU:[id number]    [place]  Final    Comment: Performed at [facility], [place] [address] [place], [place] [address]  Respiratory Panel by RT PCR (Flu A&B, Covid) - Nasopharyngeal Swab     Status: None   Collection Time: 10/11/19  1:34 AM   Specimen: Nasopharyngeal Swab  Result Value Ref Range Status   SARS Coronavirus 2 by RT PCR NEGATIVE NEGATIVE Final     Comment: (NOTE) SARS-CoV-2 target nucleic acids are NOT DETECTED. The SARS-CoV-2 RNA is generally detectable in upper respiratoy specimens during the acute phase of infection. The lowest concentration of SARS-CoV-2 viral copies this assay can detect is 131 copies/mL. A negative result does not preclude SARS-Cov-2 infection and should not be used as the sole basis for treatment or other patient management decisions. A negative result may occur with  improper specimen collection/handling, submission of specimen other than nasopharyngeal swab, presence of viral mutation(s) within the areas targeted by this assay, and inadequate number of viral copies (<131 copies/mL). A negative result must be combined with clinical observations, patient history, and epidemiological information. The expected result is Negative. Fact Sheet for Patients:  PinkCheek.be Fact Sheet for Healthcare Providers:  GravelBags.it This test is not yet ap proved or cleared by the [place] FDA and  has been authorized for detection and/or diagnosis of SARS-CoV-2 by FDA under an Emergency Use Authorization (EUA). This EUA will remain  in effect (meaning this test can be used) for the duration of the COVID-19 declaration under Section 564(b)(1) of the Act, 21 U.S.C. section 360bbb-3(b)(1), unless the authorization is terminated or revoked sooner.    Influenza A by PCR NEGATIVE NEGATIVE Final   Influenza B by PCR NEGATIVE NEGATIVE Final    Comment: (NOTE) The Xpert Xpress SARS-CoV-2/FLU/RSV assay is intended as an aid in  the diagnosis of influenza from Nasopharyngeal swab specimens and  should not be used as a sole basis for treatment. Nasal washings and  aspirates are unacceptable for Xpert Xpress SARS-CoV-2/FLU/RSV  testing. Fact Sheet for Patients: PinkCheek.be Fact Sheet for Healthcare  Providers: GravelBags.it This test is not yet approved or cleared by the [place] FDA and  has been authorized for detection and/or diagnosis of SARS-CoV-2 by  FDA under an Emergency Use Authorization (EUA). This EUA will remain  in effect (meaning this test can be used) for the duration of the  Covid-19 declaration under Section 564(b)(1) of the Act, 21  U.S.C. section 360bbb-3(b)(1), unless the authorization is  terminated or revoked. Performed at [facility], [place] [address], [place], [place] [address]   Blood Culture (routine x 2)     Status: None (Preliminary result)   Collection Time: 10/11/19  2:00 AM   Specimen: BLOOD  Result Value Ref Range Status   Specimen Description BLOOD LEFT ANTECUBITAL  Final   Special Requests   Final    BOTTLES DRAWN AEROBIC AND ANAEROBIC Blood Culture adequate volume   Culture   Final    NO GROWTH 1 DAY Performed at [facility], [place] [address] [place], [place] [address]    Report Status PENDING  Incomplete  Blood Culture (routine x 2)     Status: None (Preliminary result)   Collection  Time: 10/11/19  2:00 AM   Specimen: BLOOD  Result Value Ref Range Status   Specimen Description BLOOD RIGHT ANTECUBITAL  Final   Special Requests   Final    BOTTLES DRAWN AEROBIC AND ANAEROBIC Blood Culture adequate volume   Culture   Final    NO GROWTH 1 DAY Performed at [facility], [address] [place], [place] [address]    Report Status PENDING  Incomplete  Culture, blood (routine x 2)     Status: None (Preliminary result)   Collection Time: 10/11/19  4:10 PM   Specimen: BLOOD LEFT HAND  Result Value Ref Range Status   Specimen Description BLOOD LEFT HAND  Final   Special Requests   Final    BOTTLES DRAWN AEROBIC ONLY Blood Culture results may not be optimal due to an inadequate volume of blood received in culture bottles Performed at [facility], [place] [address] [place], [place] [address]     Culture NO GROWTH < 24 HOURS  Final   Report Status PENDING  Incomplete  Culture, blood (routine x 2)     Status: None (Preliminary result)   Collection Time: 10/11/19  4:11 PM   Specimen: BLOOD LEFT HAND  Result Value Ref Range Status   Specimen Description BLOOD LEFT HAND  Final   Special Requests   Final    BOTTLES DRAWN AEROBIC ONLY Blood Culture results may not be optimal due to an inadequate volume of blood received in culture bottles Performed at [facility], [place] [address] [place], [place] [address]    Culture NO GROWTH < 24 HOURS  Final   Report Status PENDING  Incomplete         Radiology Studies: DG Chest 2 View  Result Date: 10/12/2019 CLINICAL DATA:  Fever, cough. EXAM: CHEST - 2 VIEW COMPARISON:  October 10, 2019. FINDINGS: Stable cardiomediastinal silhouette. No pneumothorax or pleural effusion is noted. Right lung is clear. Mild left midlung and basilar opacity is noted concerning for possible pneumonia or atelectasis which appears to be stable. Bony thorax is unremarkable. IMPRESSION: Stable mild left midlung and basilar opacity is noted concerning for pneumonia or atelectasis. Electronically Signed   By: Marijo Conception M.D.   On: 10/12/2019 13:47   DG Chest 2 View  Result Date: 10/10/2019 CLINICAL DATA:  Shortness of breath. EXAM: CHEST - 2 VIEW COMPARISON:  None. FINDINGS: Mild, chronic appearing increased interstitial lung markings are seen. Very mild atelectasis are and/or early infiltrate is seen within the mid left lung. There is no evidence of a pleural effusion or pneumothorax. The heart size and mediastinal contours are within normal limits. There is mild calcification of the aortic arch. Degenerative changes seen throughout the thoracic spine. IMPRESSION: Chronic appearing increased interstitial lung markings with very mild atelectasis and/or early infiltrate within the mid left lung. Electronically Signed   By: Virgina Norfolk M.D.   On: 10/10/2019  18:05   NM Pulmonary Perf and Vent  Result Date: 10/11/2019 CLINICAL DATA:  84 year old presenting with a 4 day history of shortness of breath, cough and generalized weakness. Current history of stage 3 chronic kidney disease which precludes IV contrast administration. EXAM: NUCLEAR MEDICINE VENTILATION - PERFUSION LUNG SCAN TECHNIQUE: Ventilation images were obtained in multiple projections using inhaled aerosol Tc-52mDTPA. Perfusion images were obtained in multiple projections after intravenous injection of Tc-969mAA. RADIOPHARMACEUTICALS:  Thirty-eight mCi of Tc-9972mPA aerosol inhalation and 1.4 mCi Tc99m45m IV. COMPARISON:  No prior nuclear imaging. Chest x-ray yesterday is correlated. FINDINGS: Ventilation: No focal ventilatory abnormality. Overall slight diminished ventilation in the LEFT lung relative to the RIGHT lung. Perfusion: No wedge shaped peripheral perfusion defects to suggest acute pulmonary embolism. Overall slight diminished perfusion to the LEFT lung relative to the RIGHT lung, matching the above ventilation. IMPRESSION: Low probability of pulmonary embolism. Electronically Signed   By: Evangeline Dakin M.D.   On: 10/11/2019 14:09        Scheduled Meds: . aspirin EC  81 mg Oral Daily  . diltiazem  240 mg Oral Daily  . enoxaparin (LOVENOX) injection  30 mg Subcutaneous Q24H  . ezetimibe  10 mg Oral QPM  . furosemide  20 mg Oral Daily  . hydrALAZINE  25 mg Oral TID  . insulin aspart  0-9 Units Subcutaneous TID WC  . insulin glargine  65 Units Subcutaneous Daily  . latanoprost  1 drop Left Eye QHS  . oxybutynin  10 mg Oral Daily  . pantoprazole  40 mg Oral Daily  . pravastatin  40 mg Oral q1800  . sodium chloride flush  3 mL Intravenous Once   Continuous Infusions: . azithromycin 500 mg (10/12/19 0847)  . cefTRIAXone (ROCEPHIN)  IV 2 g (10/12/19 0534)     LOS: 1 day    Time spent: 47mn  PDomenic Polite MD [facility]  10/12/2019, 2:19 PM

## 2019-10-12 NOTE — Progress Notes (Signed)
Notified MD Broadus John via secure chat that pt's oral temp. Was 101.34F, tylenol was administered per protocol.    Paulla Fore, RN, BSN.

## 2019-10-12 NOTE — Progress Notes (Signed)
OT Cancellation Note  Patient Details Name: Jeanne [name] MRN: [id number] DOB: [birth year]   Cancelled Treatment:    Reason Eval/Treat Not Completed: Patient declined, no reason specified Pt declining and difficulty staying awake to participate, requesting return tomorrow. Will continue to follow as available and appropriate.   Zenovia Jarred, MSOT, OTR/L Acute Rehabilitation Services [facility] Office Number: [phone]  Zenovia Jarred 10/12/2019, 2:48 PM

## 2019-10-12 NOTE — Progress Notes (Signed)
Patient refused CPAP.

## 2019-10-12 NOTE — Progress Notes (Addendum)
PT Cancellation Note  Patient Details Name: Jeanne [name] MRN: [id number] DOB: 05-04-1936   Cancelled Treatment:    Reason Eval/Treat Not Completed: Patient declined, no reason specified. Pt adamantly refusing. Eyes closed, continually saying 'uh-uh' and shaking her head no. Encouragement provided without success. She said 'tomorrow.'    Lorriane Shire 10/12/2019, 1:22 PM  Lorrin Goodell, PT  Office # [phone] Pager [phone]

## 2019-10-13 LAB — BASIC METABOLIC PANEL
Anion gap: 14 (ref 5–15)
BUN: 38 mg/dL — ABNORMAL HIGH (ref 8–23)
CO2: 25 mmol/L (ref 22–32)
Calcium: 8.6 mg/dL — ABNORMAL LOW (ref 8.9–10.3)
Chloride: 95 mmol/L — ABNORMAL LOW (ref 98–111)
Creatinine, Ser: 1.84 mg/dL — ABNORMAL HIGH (ref 0.44–1.00)
GFR calc Af Amer: 29 mL/min — ABNORMAL LOW (ref >60)
GFR calc non Af Amer: 25 mL/min — ABNORMAL LOW (ref >60)
Glucose, Bld: 113 mg/dL — ABNORMAL HIGH (ref 70–99)
Potassium: 3.5 mmol/L (ref 3.5–5.1)
Sodium: 134 mmol/L — ABNORMAL LOW (ref 135–145)

## 2019-10-13 LAB — GLUCOSE, CAPILLARY
Glucose-Capillary: 116 mg/dL — ABNORMAL HIGH (ref 70–99)
Glucose-Capillary: 110 mg/dL — ABNORMAL HIGH (ref 70–99)
Glucose-Capillary: 98 mg/dL (ref 70–99)
Glucose-Capillary: 115 mg/dL — ABNORMAL HIGH (ref 70–99)

## 2019-10-13 LAB — CBC
HCT: 39 % (ref 36.0–46.0)
Hemoglobin: 12.4 g/dL (ref 12.0–15.0)
MCH: 27.3 pg (ref 26.0–34.0)
MCHC: 31.8 g/dL (ref 30.0–36.0)
MCV: 85.9 fL (ref 80.0–100.0)
Platelets: 106 10*3/uL — ABNORMAL LOW (ref 150–400)
RBC: 4.54 MIL/uL (ref 3.87–5.11)
RDW: 13.9 % (ref 11.5–15.5)
WBC: 4.8 10*3/uL (ref 4.0–10.5)
nRBC: 0 % (ref 0.0–0.2)

## 2019-10-13 LAB — C-REACTIVE PROTEIN: CRP: 16 mg/dL — ABNORMAL HIGH (ref <1.0)

## 2019-10-13 LAB — D-DIMER, QUANTITATIVE: D-Dimer, Quant: 3.12 ug/mL-FEU — ABNORMAL HIGH (ref 0.00–0.50)

## 2019-10-13 LAB — FERRITIN: Ferritin: 289 ng/mL (ref 11–307)

## 2019-10-13 LAB — SARS CORONAVIRUS 2 (TAT 6-24 HRS): SARS Coronavirus 2: POSITIVE — AB

## 2019-10-13 LAB — PROCALCITONIN: Procalcitonin: 0.5 ng/mL

## 2019-10-13 MED ORDER — SODIUM CHLORIDE 0.9 % IV SOLN
100.0000 mg | Freq: Every day | INTRAVENOUS | Status: AC
  Administered 2019-10-14 – 2019-10-17 (×4): 100 mg via INTRAVENOUS
  Filled 2019-10-13 (×4): qty 20

## 2019-10-13 MED ORDER — ENOXAPARIN SODIUM 40 MG/0.4ML ~~LOC~~ SOLN
40.0000 mg | SUBCUTANEOUS | Status: DC
  Administered 2019-10-13: 40 mg via SUBCUTANEOUS
  Filled 2019-10-13: qty 0.4

## 2019-10-13 MED ORDER — SODIUM CHLORIDE 0.9 % IV SOLN
200.0000 mg | Freq: Once | INTRAVENOUS | Status: AC
  Administered 2019-10-13: 200 mg via INTRAVENOUS
  Filled 2019-10-13: qty 200

## 2019-10-13 MED ORDER — ENOXAPARIN SODIUM 40 MG/0.4ML ~~LOC~~ SOLN
35.0000 mg | Freq: Once | SUBCUTANEOUS | Status: AC
  Administered 2019-10-13: 35 mg via SUBCUTANEOUS
  Filled 2019-10-13: qty 0.4

## 2019-10-13 MED ORDER — DEXAMETHASONE SODIUM PHOSPHATE 10 MG/ML IJ SOLN
6.0000 mg | INTRAMUSCULAR | Status: DC
  Administered 2019-10-13: 6 mg via INTRAVENOUS
  Filled 2019-10-13: qty 0.6

## 2019-10-13 MED ORDER — ENOXAPARIN SODIUM 80 MG/0.8ML ~~LOC~~ SOLN
0.5000 mg/kg | SUBCUTANEOUS | Status: DC
  Administered 2019-10-14 – 2019-10-16 (×3): 75 mg via SUBCUTANEOUS
  Filled 2019-10-13 (×3): qty 0.8

## 2019-10-13 NOTE — Evaluation (Signed)
Physical Therapy Evaluation Patient Details Name: Jeanne [name] MRN: [id number] DOB: 1935-11-08 Today's Date: 10/13/2019   History of Present Illness  84 yo female with onset of fever and acute respiratory failure was admitted, noted CAP with general weakness.  Has had mild increase in troponin, chronic CHF, cleared for PE and Covid 19.  PMHx:  DM, super morbid obesity, CHF, OSA, CKD 4, monoclonal gammopathy unknown significance, anemia  Clinical Impression  Pt was seen for mobility after being admitted with weakness, fever and respiratory failure.  Her O2 sats were 90 to 91% in bed, dropped to 84% to sit up and then recovered to 94%.  After returning to bed was initially 85% then recovered to 94%.  Left pt propped up sitting in bed with O2 in place, supported her posture and got her comfortable.  Follow acutely with two person help to get pt in standing and up to chair as soon as possible given her prior functional level and family support for home.  Pt needs to focus on maintaining sats with strengthening to BLE's and increasing standing endurance as tolerated.    Follow Up Recommendations SNF    Equipment Recommendations  None recommended by PT    Recommendations for Other Services       Precautions / Restrictions Precautions Precautions: Fall Precaution Comments: monitor O2 sats Restrictions Weight Bearing Restrictions: No      Mobility  Bed Mobility Overal bed mobility: Needs Assistance Bed Mobility: Supine to Sit;Sit to Supine     Supine to sit: Mod assist;Max assist Sit to supine: Max assist   General bed mobility comments: requires dense cues and instruction for hand placement  Transfers Overall transfer level: Needs assistance Equipment used: Rolling walker (2 wheeled);1 person hand held assist Transfers: Sit to/from Stand Sit to Stand: Total assist         General transfer comment: unable to initiate a stand with pt   Ambulation/Gait              General Gait Details: unable  Stairs            Wheelchair Mobility    Modified Rankin (Stroke Patients Only)       Balance Overall balance assessment: Needs assistance Sitting-balance support: Feet supported;Single extremity supported Sitting balance-Leahy Scale: Fair       Standing balance-Leahy Scale: Zero                               Pertinent Vitals/Pain Pain Assessment: Faces Faces Pain Scale: Hurts little more Pain Location: knees with movement to side of bed Pain Descriptors / Indicators: Sore;Tender Pain Intervention(s): Repositioned;Monitored during session    Home Living Family/patient expects to be discharged to:: Private residence Living Arrangements: Children Available Help at Discharge: Family;Available 24 hours/day Type of Home: House Home Access: Level entry     Home Layout: One level Home Equipment: Walker - 2 wheels;Cane - single point Additional Comments: her most recent walking was in house only until admission    Prior Function Level of Independence: Independent with assistive device(s)         Comments: hurry cane with all indoor mobility     Hand Dominance   Dominant Hand: Right    Extremity/Trunk Assessment   Upper Extremity Assessment Upper Extremity Assessment: Generalized weakness    Lower Extremity Assessment Lower Extremity Assessment: Generalized weakness    Cervical / Trunk Assessment Cervical / Trunk Assessment:  Normal  Communication   Communication: No difficulties  Cognition Arousal/Alertness: Awake/alert Behavior During Therapy: Flat affect Overall Cognitive Status: Within Functional Limits for tasks assessed                                 General Comments: able to follow cues but did not initiate any standing on command      General Comments General comments (skin integrity, edema, etc.): pt was assisted to move legs onto and off bed, with limited initiation and effort  on her part.  pt is reporting feeing unable to tolerate much movement, did not have enough to use RW without second person next visit    Exercises     Assessment/Plan    PT Assessment Patient needs continued PT services  PT Problem List Decreased strength;Decreased range of motion;Decreased activity tolerance;Decreased balance;Decreased mobility;Decreased coordination;Decreased knowledge of use of DME;Decreased safety awareness;Cardiopulmonary status limiting activity       PT Treatment Interventions DME instruction;Gait training;Functional mobility training;Therapeutic activities;Therapeutic exercise;Balance training;Neuromuscular re-education;Patient/family education    PT Goals (Current goals can be found in the Care Plan section)  Acute Rehab PT Goals Patient Stated Goal: to feel better and stronger PT Goal Formulation: With patient Time For Goal Achievement: 10/27/19 Potential to Achieve Goals: Good    Frequency Min 3X/week   Barriers to discharge Decreased caregiver support has no assist at times at home    Co-evaluation               AM-PAC PT "6 Clicks" Mobility  Outcome Measure Help needed turning from your back to your side while in a flat bed without using bedrails?: A Lot Help needed moving from lying on your back to sitting on the side of a flat bed without using bedrails?: A Lot Help needed moving to and from a bed to a chair (including a wheelchair)?: A Lot Help needed standing up from a chair using your arms (e.g., wheelchair or bedside chair)?: Total Help needed to walk in hospital room?: Total Help needed climbing 3-5 steps with a railing? : Total 6 Click Score: 9    End of Session Equipment Utilized During Treatment: Oxygen Activity Tolerance: Patient limited by fatigue;Treatment limited secondary to medical complications (Comment) Patient left: in bed;with call bell/phone within reach;with bed alarm set Nurse Communication: Mobility status PT  Visit Diagnosis: Muscle weakness (generalized) (M62.81);Difficulty in walking, not elsewhere classified (R26.2);Pain Pain - Right/Left: (Both) Pain - part of body: Knee    Time: 1050-1120 PT Time Calculation (min) (ACUTE ONLY): 30 min   Charges:   PT Evaluation $PT Eval Moderate Complexity: 1 Mod PT Treatments $Therapeutic Activity: 8-22 mins       Ramond Dial 10/13/2019, 1:26 PM   Mee Hives, PT MS Acute Rehab Dept. Number: [place] and [place]

## 2019-10-13 NOTE — Progress Notes (Signed)
Pharmacy Antibiotic Note  Jeanne [name] is a 84 y.o. female admitted on 10/10/2019 with SOB, fever; COVID test positive. Pharmacy has been consulted for remdesivir dosing.  Medical history includes: diabetes mellitus type 2, hypertension, chronic kidney disease stage 4, sleep apnea, chronic diastolic HF, CAD, HLD, morbid obesity   WBC 4.8, Tmax 103F, AST 37, ALT 17 (LFTs WNL)  Plan: Remdesivir 200 mg IV X 1, followed by 100 mg IV daily X 4 days Monitor LFTs daily Monitor WBC, temp, clinical improvement  Height: 5\' 3"  (160 cm) Weight: (!) 322 lb 1.5 oz (146.1 kg) IBW/kg (Calculated) : 52.4  Temp (24hrs), Avg:100.7 F (38.2 C), Min:98.3 F (36.8 C), Max:103 F (39.4 C)  Recent Labs  Lab 10/10/19 1859 10/11/19 0131 10/11/19 0247 10/11/19 0738 10/12/19 0331 10/13/19 0517  WBC 4.3  --   --  4.1 4.7 4.8  CREATININE 2.32*  --   --  2.19* 2.04* 1.84*  LATICACIDVEN  --  1.1 1.3  --   --   --     Estimated Creatinine Clearance: 34 mL/min (A) (by C-G formula based on SCr of 1.84 mg/dL (H)).    Allergies  Allergen Reactions  . Amoxicillin Nausea Only    Has patient had a PCN reaction causing immediate rash, facial/tongue/throat swelling, SOB or lightheadedness with hypotension: yes, i was dizzy Has patient had a PCN reaction causing severe rash involving mucus membranes or skin necrosis: no Did a PCN reaction that required hospitalization : no, called the DR. Did PCN reaction occurring within the last 10 years: unknown- pt cant recall exactly how long ago it was. if all of the above answers are "NO", then may proceed with Cephalosporin use.   [name] [Canagliflozin] Nausea Only and Other (See Comments)    Dizziness     Antimicrobials this admission: 2/6 ceftriaxone >>  2/6 azithromycin >>   Microbiology results: 2/6 BCx 4: NGTD 2/6 COVID: positive 2/6 flu A, flu B: negative  Thank you for allowing pharmacy to be a part of this patient's care.  Gillermina Hu,  PharmD, BCPS, [facility] Clinical Pharmacist 10/13/2019 5:43 PM

## 2019-10-13 NOTE — Progress Notes (Signed)
Patient temp is 102.7 MD notify, will give tylenol. Will continue to monitor.

## 2019-10-13 NOTE — Progress Notes (Signed)
NURSING PROGRESS NOTE  Jeanne [name] [id number] Transfer Data: 10/13/2019 7:40 PM Attending Provider: Domenic Polite, MD [id number], Jori Moll, MD Code Status: DNR   Jeanne [name] is a 84 y.o. female patient transferred from 5MW  -No acute distress noted.  -No complaints of shortness of breath.  -No complaints of chest pain.   Cardiac Monitoring: Box # 21 in place. Cardiac monitor yields:normal sinus rhythm.  Last Documented Vital Signs: Blood pressure (!) 129/109, pulse 84, temperature (!) 101.1 F (38.4 C), temperature source Oral, resp. rate 18, height 5\' 3"  (1.6 m), weight (!) 146.1 kg, SpO2 95 %.  IV Fluids:  IV in place, occlusive dsg intact without redness, IV cath antecubital right, condition patent and no redness IV push only, no IV fluids.   Allergies:  Amoxicillin and Invokana [canagliflozin]  Past Medical History:   has a past medical history of Anemia, Arthritis, Carotid arterial disease ([place]), Diabetes mellitus, Hyperlipidemia, Hypertension, Migraine, Obesity, Reflux, and Sleep apnea.  Past Surgical History:   has a past surgical history that includes Joint replacement (Right, 2005); Cholecystectomy (50 yrs ago); Wisdom tooth extraction; and Cardiac catheterization (N/A, 10/12/2015).  Social History:   reports that she has never smoked. She has never used smokeless tobacco. She reports that she does not drink alcohol or use drugs.  Skin: intact except where otherwise charted  Patient/Family orientated to room. Information packet given to patient/family. Admission inpatient armband information verified with patient/family to include name and date of birth and placed on patient arm. Side rails up x 2, fall assessment and education completed with patient/family. Patient/family able to verbalize understanding of risk associated with falls and verbalized understanding to call for assistance before getting out of bed. Call light within reach. Patient/family able to voice and  demonstrate understanding of unit orientation instructions.

## 2019-10-13 NOTE — Progress Notes (Signed)
PROGRESS NOTE    Jeanne [name]  YBO:[id number] DOB: 09/04/1936 DOA: 10/10/2019 PCP: Seward Carol, MD  Brief Narrative: 84 year old female with history of super morbid obesity, BMI 57.7, type 2 diabetes mellitus, chronic kidney disease stage 4, OSA/OHS, chronic diastolic CHF, CAD, hypertension, dyslipidemia presented to the emergency room overnight with shortness of breath and fever. -Patient reported shortness of breath, weakness over the last 3 to 4 days, history of feeling feverish as well did not check her temperature at home, was febrile in the emergency room. -Temperature was 100.2, chest x-ray showed questionable infiltrates in addition to chronic findings, Covid point-of-care and PCR was negative, mildly hypoxic requiring 2 L of O2, procalcitonin level was 0.26 and D-dimer was 1.5, BNP of 176  Assessment & Plan:   Acute hypoxic respiratory failure Fever Suspected community-acquired pneumonia -VQ scan done on admission shows low probability of pulmonary embolism -COVID-19 point-of-care antigen and PCR is negative -CRP mildly elevated, ferritin is normal -Continue ceftriaxone, azithromycin, day 3 -History is not suggestive of aspiration -Continues to have recurrent fevers, blood cultures negative thus far, will repeat COVID-19 PCR, CRP has trended up chest x-ray yesterday suggestive of pneumonia  Chronic diastolic CHF - clinically appears euvolemic, continue oral Lasix  Mildly elevated high-sensitivity troponin -Likely secondary to fever, pneumonia no clinical evidence of ACS at this time -EKG with sinus tachycardia, no acute ST-T wave changes -Left heart cath in 2017 noted nonobstructive coronary disease, medical management was recommended   Type 2 diabetes mellitus -Continue Lantus and sliding scale -CBGs are stable   Super morbid obesity -BMI is 57.7 -Lifestyle modification, weight loss advised   Chronic kidney disease stage IV -Creatinine stable at  baseline -Followed by [facility]   History of monoclonal gammopathy of unknown significance -Recently had an abnormal serum electrophoresis which showed M spike of 0.6 g and immunofixation showed biclonal moderate clonal protein, was seen by oncology recently not felt to have multiple myeloma at this time   Obstructive sleep apnea -Continue CPAP nightly    Anemia of chronic disease -Hemoglobin stable at baseline  DVT prophylaxis: Lovenox CODE STATUS discussed with the patient 2/6, she wishes to be DNR Family communication, none baseline, discussed with daughters 2/8 Disposition Home pending improvement in fever, respiratory failure  Procedures:   Antimicrobials:    Subjective: -Mild cough and weakness, denies any shortness of breath -No abdominal pain nausea vomiting or diarrhea  Objective: Vitals:   10/12/19 1650 10/13/19 0500 10/13/19 0935 10/13/19 1229  BP:  123/72 (!) 140/56   Pulse:  70 61   Resp:  18 18   Temp: 100.3 F (37.9 C) 98.3 F (36.8 C) (!) 102.7 F (39.3 C) (!) 100.7 F (38.2 C)  TempSrc: Oral Oral Oral Oral  SpO2:  98% 97%   Weight:      Height:        Intake/Output Summary (Last 24 hours) at 10/13/2019 1311 Last data filed at 10/13/2019 0845 Gross per 24 hour  Intake 830 ml  Output 825 ml  Net 5 ml   Filed Weights   10/11/19 0507 10/11/19 2230  Weight: (!) 147.9 kg (!) 146.1 kg    Examination:  Gen: Morbidly obese chronically ill female sitting up in bed AAOx3, no distress HEENT: PERRLA, Neck supple, no JVD Lungs: Distant breath sounds, diminished at both bases CVS: S1-S2, regular rate rhythm Abd: soft, Non tender, non distended, BS present Extremities: No edema Skin: no new rashes Psychiatry: Judgement and insight appear normal.  Mood & affect appropriate.     Data Reviewed:   CBC: Recent Labs  Lab 10/10/19 1859 10/11/19 0738 10/12/19 0331 10/13/19 0517  WBC 4.3 4.1 4.7 4.8  NEUTROABS  --  2.3  --   --    HGB 11.9* 12.6 11.7* 12.4  HCT 37.7 39.3 36.0 39.0  MCV 87.5 85.6 84.1 85.9  PLT 152 106* 128* 793*   Basic Metabolic Panel: Recent Labs  Lab 10/10/19 1859 10/11/19 0738 10/12/19 0331 10/13/19 0517  NA 132* 136 133* 134*  K 3.5 3.4* 3.1* 3.5  CL 90* 93* 91* 95*  CO2 '28 27 27 25  ' GLUCOSE 224* 162* 160* 113*  BUN 37* 40* 37* 38*  CREATININE 2.32* 2.19* 2.04* 1.84*  CALCIUM 8.8* 8.8* 8.4* 8.6*   GFR: Estimated Creatinine Clearance: 34 mL/min (A) (by C-G formula based on SCr of 1.84 mg/dL (H)). Liver Function Tests: Recent Labs  Lab 10/11/19 0738 10/12/19 0331  AST 29 37  ALT 16 17  ALKPHOS 43 40  BILITOT 0.4 0.5  PROT 7.2 7.0  ALBUMIN 2.9* 2.7*   No results for input(s): LIPASE, AMYLASE in the last 168 hours. No results for input(s): AMMONIA in the last 168 hours. Coagulation Profile: No results for input(s): INR, PROTIME in the last 168 hours. Cardiac Enzymes: No results for input(s): CKTOTAL, CKMB, CKMBINDEX, TROPONINI in the last 168 hours. BNP (last 3 results) No results for input(s): PROBNP in the last 8760 hours. HbA1C: No results for input(s): HGBA1C in the last 72 hours. CBG: Recent Labs  Lab 10/12/19 1121 10/12/19 1613 10/12/19 2043 10/13/19 0657 10/13/19 1140  GLUCAP 158* 184* 138* 116* 110*   Lipid Profile: Recent Labs    10/11/19 0131  TRIG 53   Thyroid Function Tests: No results for input(s): TSH, T4TOTAL, FREET4, T3FREE, THYROIDAB in the last 72 hours. Anemia Panel: Recent Labs    10/11/19 1156 10/13/19 1026  FERRITIN 76 289   Urine analysis:    Component Value Date/Time   COLORURINE YELLOW 10/12/2019 [place] 10/12/2019 1608   LABSPEC 1.016 10/12/2019 1608   PHURINE 5.0 10/12/2019 1608   GLUCOSEU NEGATIVE 10/12/2019 1608   HGBUR NEGATIVE 10/12/2019 1608   BILIRUBINUR NEGATIVE 10/12/2019 1608   KETONESUR NEGATIVE 10/12/2019 1608   PROTEINUR 100 (A) 10/12/2019 1608   UROBILINOGEN 0.2 09/11/2011 1525    NITRITE NEGATIVE 10/12/2019 1608   LEUKOCYTESUR NEGATIVE 10/12/2019 1608   Sepsis Labs: '@LABRCNTIP' (procalcitonin:4,lacticidven:4)  ) Recent Results (from the past 240 hour(s))  Novel Coronavirus, NAA (Hosp order, Send-out to Ref Lab; TAT 18-24 hrs     Status: None   Collection Time: 10/10/19  5:20 PM   Specimen: Nasopharyngeal Swab; Respiratory  Result Value Ref Range Status   SARS-CoV-2, NAA NOT DETECTED NOT DETECTED Final    Comment: (NOTE) This nucleic acid amplification test was developed and its performance characteristics determined by [employer] and [employer]. Nucleic acid amplification tests include RT-PCR and TMA. This test has not been FDA cleared or approved. This test has been authorized by FDA under an Emergency Use Authorization (EUA). This test is only authorized for the duration of time the declaration that circumstances exist justifying the authorization of the emergency use of in vitro diagnostic tests for detection of SARS-CoV-2 virus and/or diagnosis of COVID-19 infection under section 564(b)(1) of the Act, 21 U.S.C. 903ESP-2(Z) (1), unless the authorization is terminated or revoked sooner. When diagnostic testing is negative, the possibility of a false negative result should be considered in  the context of a patient's recent exposures and the presence of clinical signs and symptoms consistent with COVID-19. An individual without symptoms of COVID- 19 and who is not shedding SARS-CoV-2  virus would expect to have a negative (not detected) result in this assay. Performed At: [facility] [address] [place], [place] [id number] Rush Farmer MD OE:[id number]    [place]  Final    Comment: Performed at [facility], [place] [address] [place], [place] [address]  Respiratory Panel by RT PCR (Flu A&B, Covid) - Nasopharyngeal Swab     Status: None   Collection Time: 10/11/19  1:34 AM   Specimen: Nasopharyngeal Swab  Result  Value Ref Range Status   SARS Coronavirus 2 by RT PCR NEGATIVE NEGATIVE Final    Comment: (NOTE) SARS-CoV-2 target nucleic acids are NOT DETECTED. The SARS-CoV-2 RNA is generally detectable in upper respiratoy specimens during the acute phase of infection. The lowest concentration of SARS-CoV-2 viral copies this assay can detect is 131 copies/mL. A negative result does not preclude SARS-Cov-2 infection and should not be used as the sole basis for treatment or other patient management decisions. A negative result may occur with  improper specimen collection/handling, submission of specimen other than nasopharyngeal swab, presence of viral mutation(s) within the areas targeted by this assay, and inadequate number of viral copies (<131 copies/mL). A negative result must be combined with clinical observations, patient history, and epidemiological information. The expected result is Negative. Fact Sheet for Patients:  PinkCheek.be Fact Sheet for Healthcare Providers:  GravelBags.it This test is not yet ap proved or cleared by the [place] FDA and  has been authorized for detection and/or diagnosis of SARS-CoV-2 by FDA under an Emergency Use Authorization (EUA). This EUA will remain  in effect (meaning this test can be used) for the duration of the COVID-19 declaration under Section 564(b)(1) of the Act, 21 U.S.C. section 360bbb-3(b)(1), unless the authorization is terminated or revoked sooner.    Influenza A by PCR NEGATIVE NEGATIVE Final   Influenza B by PCR NEGATIVE NEGATIVE Final    Comment: (NOTE) The Xpert Xpress SARS-CoV-2/FLU/RSV assay is intended as an aid in  the diagnosis of influenza from Nasopharyngeal swab specimens and  should not be used as a sole basis for treatment. Nasal washings and  aspirates are unacceptable for Xpert Xpress SARS-CoV-2/FLU/RSV  testing. Fact Sheet for  Patients: PinkCheek.be Fact Sheet for Healthcare Providers: GravelBags.it This test is not yet approved or cleared by the [place] FDA and  has been authorized for detection and/or diagnosis of SARS-CoV-2 by  FDA under an Emergency Use Authorization (EUA). This EUA will remain  in effect (meaning this test can be used) for the duration of the  Covid-19 declaration under Section 564(b)(1) of the Act, 21  U.S.C. section 360bbb-3(b)(1), unless the authorization is  terminated or revoked. Performed at [facility], [place] [address] [place], [place] [address]   Blood Culture (routine x 2)     Status: None (Preliminary result)   Collection Time: 10/11/19  2:00 AM   Specimen: BLOOD  Result Value Ref Range Status   Specimen Description BLOOD LEFT ANTECUBITAL  Final   Special Requests   Final    BOTTLES DRAWN AEROBIC AND ANAEROBIC Blood Culture adequate volume   Culture   Final    NO GROWTH 1 DAY Performed at [facility], [place] [address], [place], [place] [address]    Report Status PENDING  Incomplete  Blood Culture (routine x 2)     Status: None (Preliminary result)   Collection Time: 10/11/19  2:00 AM   Specimen: BLOOD  Result Value Ref Range Status   Specimen Description BLOOD RIGHT ANTECUBITAL  Final   Special Requests   Final    BOTTLES DRAWN AEROBIC AND ANAEROBIC Blood Culture adequate volume   Culture   Final    NO GROWTH 1 DAY Performed at [facility], [place] [address], [place], [place] [address]    Report Status PENDING  Incomplete  Culture, blood (routine x 2)     Status: None (Preliminary result)   Collection Time: 10/11/19  4:10 PM   Specimen: BLOOD LEFT HAND  Result Value Ref Range Status   Specimen Description BLOOD LEFT HAND  Final   Special Requests   Final    BOTTLES DRAWN AEROBIC ONLY Blood Culture results may not be optimal due to an inadequate volume of blood received in culture  bottles Performed at [facility], [place] [address] [place], [place] [address]    Culture NO GROWTH < 24 HOURS  Final   Report Status PENDING  Incomplete  Culture, blood (routine x 2)     Status: None (Preliminary result)   Collection Time: 10/11/19  4:11 PM   Specimen: BLOOD LEFT HAND  Result Value Ref Range Status   Specimen Description BLOOD LEFT HAND  Final   Special Requests   Final    BOTTLES DRAWN AEROBIC ONLY Blood Culture results may not be optimal due to an inadequate volume of blood received in culture bottles Performed at [facility], [place] [address], [place], [place] [address]    Culture NO GROWTH < 24 HOURS  Final   Report Status PENDING  Incomplete         Radiology Studies: DG Chest 2 View  Result Date: 10/12/2019 CLINICAL DATA:  Fever, cough. EXAM: CHEST - 2 VIEW COMPARISON:  October 10, 2019. FINDINGS: Stable cardiomediastinal silhouette. No pneumothorax or pleural effusion is noted. Right lung is clear. Mild left midlung and basilar opacity is noted concerning for possible pneumonia or atelectasis which appears to be stable. Bony thorax is unremarkable. IMPRESSION: Stable mild left midlung and basilar opacity is noted concerning for pneumonia or atelectasis. Electronically Signed   By: Marijo Conception M.D.   On: 10/12/2019 13:47        Scheduled Meds: . aspirin EC  81 mg Oral Daily  . diltiazem  240 mg Oral Daily  . enoxaparin (LOVENOX) injection  40 mg Subcutaneous Q24H  . ezetimibe  10 mg Oral QPM  . furosemide  20 mg Oral Daily  . hydrALAZINE  25 mg Oral TID  . insulin aspart  0-9 Units Subcutaneous TID WC  . insulin glargine  65 Units Subcutaneous Daily  . latanoprost  1 drop Left Eye QHS  . oxybutynin  10 mg Oral Daily  . pantoprazole  40 mg Oral Daily  . pravastatin  40 mg Oral q1800  . sodium chloride flush  3 mL Intravenous Once   Continuous Infusions: . azithromycin 500 mg (10/13/19 0912)  . cefTRIAXone (ROCEPHIN)  IV 2 g  (10/13/19 0553)     LOS: 2 days    Time spent: 51mn  PDomenic Polite MD [facility]  10/13/2019, 1:11 PM

## 2019-10-14 DIAGNOSIS — I119 Hypertensive heart disease without heart failure: Secondary | ICD-10-CM

## 2019-10-14 LAB — COMPREHENSIVE METABOLIC PANEL
ALT: 23 U/L (ref 0–44)
AST: 48 U/L — ABNORMAL HIGH (ref 15–41)
Albumin: 2.3 g/dL — ABNORMAL LOW (ref 3.5–5.0)
Alkaline Phosphatase: 34 U/L — ABNORMAL LOW (ref 38–126)
Anion gap: 19 — ABNORMAL HIGH (ref 5–15)
BUN: 40 mg/dL — ABNORMAL HIGH (ref 8–23)
CO2: 23 mmol/L (ref 22–32)
Calcium: 8.3 mg/dL — ABNORMAL LOW (ref 8.9–10.3)
Chloride: 93 mmol/L — ABNORMAL LOW (ref 98–111)
Creatinine, Ser: 2 mg/dL — ABNORMAL HIGH (ref 0.44–1.00)
GFR calc Af Amer: 26 mL/min — ABNORMAL LOW (ref >60)
GFR calc non Af Amer: 23 mL/min — ABNORMAL LOW (ref >60)
Glucose, Bld: 144 mg/dL — ABNORMAL HIGH (ref 70–99)
Potassium: 3.5 mmol/L (ref 3.5–5.1)
Sodium: 135 mmol/L (ref 135–145)
Total Bilirubin: 0.5 mg/dL (ref 0.3–1.2)
Total Protein: 6.7 g/dL (ref 6.5–8.1)

## 2019-10-14 LAB — CBC
HCT: 36.1 % (ref 36.0–46.0)
Hemoglobin: 11.2 g/dL — ABNORMAL LOW (ref 12.0–15.0)
MCH: 27 pg (ref 26.0–34.0)
MCHC: 31 g/dL (ref 30.0–36.0)
MCV: 87 fL (ref 80.0–100.0)
Platelets: 139 10*3/uL — ABNORMAL LOW (ref 150–400)
RBC: 4.15 MIL/uL (ref 3.87–5.11)
RDW: 14.2 % (ref 11.5–15.5)
WBC: 5.3 10*3/uL (ref 4.0–10.5)
nRBC: 0 % (ref 0.0–0.2)

## 2019-10-14 LAB — GLUCOSE, CAPILLARY
Glucose-Capillary: 132 mg/dL — ABNORMAL HIGH (ref 70–99)
Glucose-Capillary: 207 mg/dL — ABNORMAL HIGH (ref 70–99)
Glucose-Capillary: 146 mg/dL — ABNORMAL HIGH (ref 70–99)

## 2019-10-14 LAB — C-REACTIVE PROTEIN: CRP: 22.4 mg/dL — ABNORMAL HIGH (ref <1.0)

## 2019-10-14 LAB — FERRITIN: Ferritin: 464 ng/mL — ABNORMAL HIGH (ref 11–307)

## 2019-10-14 LAB — D-DIMER, QUANTITATIVE: D-Dimer, Quant: 3.27 ug/mL-FEU — ABNORMAL HIGH (ref 0.00–0.50)

## 2019-10-14 MED ORDER — DEXAMETHASONE SODIUM PHOSPHATE 10 MG/ML IJ SOLN
10.0000 mg | INTRAMUSCULAR | Status: DC
  Administered 2019-10-14 – 2019-10-16 (×3): 10 mg via INTRAVENOUS
  Filled 2019-10-14 (×3): qty 1
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
## 2019-10-14 NOTE — NC FL2 (Signed)
[place] LEVEL OF CARE SCREENING TOOL     IDENTIFICATION  Patient Name: Jeanne [name] Birthdate: [birth year] Sex: female Admission Date (Current Location): 10/10/2019  [facility] and [place] Number:  Herbalist and Address:  [facility] [place], [place] [address] [place], [place] [address]      Provider Number: [id number]  Attending Physician Name and Address:  Jonetta Osgood, MD  Relative Name and Phone Number:       Current Level of Care: Hospital Recommended Level of Care: [place] Prior Approval Number:    Date Approved/Denied:   PASRR Number: [id number]  Discharge Plan: SNF    Current Diagnoses: Patient Active Problem List   Diagnosis Date Noted  . Acute respiratory failure with hypoxia ([place]) 10/11/2019  . CAP (community acquired pneumonia) 10/11/2019  . Diastolic dysfunction, left ventricle 04/05/2019  . Atherosclerosis of native coronary artery of native heart without angina pectoris 11/23/2018  . Hypercholesteremia 11/23/2018  . Unstable angina ([place]) 10/12/2015  . Abnormal myocardial perfusion study 10/12/2015  . NSVT (nonsustained ventricular tachycardia) ([place]) 10/11/2015  . Hypertension with heart disease 10/11/2015  . CKD (chronic kidney disease), stage III 10/11/2015  . Pain in the chest   . Chest pain 10/08/2015  . Diabetes mellitus type II, non insulin dependent ([place]) 10/08/2015  . AKI (acute kidney injury) ([place])   . Abscess of left arm 01/01/2013  . Migraine   . Morbid obesity ([place])   . Sleep apnea   . Stenosis of left carotid artery   . Reflux   . Anemia     Orientation RESPIRATION BLADDER Height & Weight     Self, Time, Situation, Place  O2(Nasal cannula 2L) Continent Weight: (!) 324 lb 8.3 oz (147.2 kg) Height:  5\' 3"  (160 cm)  BEHAVIORAL SYMPTOMS/MOOD NEUROLOGICAL BOWEL NUTRITION STATUS      Continent Diet(Please see DC Summary)  AMBULATORY STATUS COMMUNICATION OF NEEDS Skin    Extensive Assist Verbally Normal                       Personal Care Assistance Level of Assistance  Bathing, Feeding, Dressing Bathing Assistance: Maximum assistance Feeding assistance: Independent Dressing Assistance: Limited assistance     Functional Limitations Info  Sight, Hearing, Speech Sight Info: Adequate Hearing Info: Adequate Speech Info: Adequate    SPECIAL CARE FACTORS FREQUENCY  PT (By licensed PT), OT (By licensed OT)     PT Frequency: 5x/week OT Frequency: 5x/week            Contractures Contractures Info: Not present    Additional Factors Info  Code Status, Allergies, Insulin Sliding Scale, Isolation Precautions Code Status Info: DNR Allergies Info: Amoxicillin, Invokana (Canagliflozin)   Insulin Sliding Scale Info: See DC Summary Isolation Precautions Info: COVID +     Current Medications (10/14/2019):  This is the current hospital active medication list Current Facility-Administered Medications  Medication Dose Route Frequency Provider Last Rate Last Admin  . acetaminophen (TYLENOL) tablet 650 mg  650 mg Oral Q6H PRN Rise Patience, MD   650 mg at 10/13/19 1734   Or  . acetaminophen (TYLENOL) suppository 650 mg  650 mg Rectal Q6H PRN Rise Patience, MD      . aspirin EC tablet 81 mg  81 mg Oral Daily Rise Patience, MD   81 mg at 10/14/19 0954  . azithromycin (ZITHROMAX) 500 mg in sodium chloride 0.9 % 250 mL IVPB  500 mg Intravenous Q24H Rise Patience, MD 250 mL/hr at 10/14/19 1000 500 mg at 10/14/19 1000  . cefTRIAXone (ROCEPHIN) 2 g in sodium chloride 0.9 % 100 mL IVPB  2 g Intravenous Q24H Rise Patience, MD   Stopped at 10/14/19 [phone]  . dexamethasone (DECADRON) injection 10 mg  10 mg Intravenous Q24H Ghimire, Shanker M, MD      . diltiazem (CARDIZEM CD) 24 hr capsule 240 mg  240 mg Oral Daily Rise Patience, MD   240 mg at 10/14/19 0955  . enoxaparin (LOVENOX) injection 75 mg  0.5 mg/kg Subcutaneous  Q24H Lang Snow, FNP      . ezetimibe (ZETIA) tablet 10 mg  10 mg Oral QPM Rise Patience, MD   10 mg at 10/13/19 1734  . furosemide (LASIX) tablet 20 mg  20 mg Oral Daily Rise Patience, MD   20 mg at 10/14/19 0955  . hydrALAZINE (APRESOLINE) tablet 25 mg  25 mg Oral TID Rise Patience, MD   25 mg at 10/14/19 0955  . insulin aspart (novoLOG) injection 0-9 Units  0-9 Units Subcutaneous TID WC Rise Patience, MD   3 Units at 10/14/19 1218  . insulin glargine (LANTUS) injection 65 Units  65 Units Subcutaneous Daily Rise Patience, MD   65 Units at 10/14/19 1001  . latanoprost (XALATAN) 0.005 % ophthalmic solution 1 drop  1 drop Left Eye QHS Rise Patience, MD   1 drop at 10/13/19 2225  . nitroGLYCERIN (NITROSTAT) SL tablet 0.4 mg  0.4 mg Sublingual PRN Rise Patience, MD      . ondansetron [facility]) tablet 4 mg  4 mg Oral Q6H PRN Rise Patience, MD       Or  . ondansetron [facility]) injection 4 mg  4 mg Intravenous Q6H PRN Rise Patience, MD      . oxybutynin (DITROPAN-XL) 24 hr tablet 10 mg  10 mg Oral Daily Rise Patience, MD   10 mg at 10/14/19 0956  . pantoprazole (PROTONIX) EC tablet 40 mg  40 mg Oral Daily Rise Patience, MD   40 mg at 10/14/19 0954  . pravastatin (PRAVACHOL) tablet 40 mg  40 mg Oral q1800 Rise Patience, MD   40 mg at 10/13/19 1734  . remdesivir 100 mg in sodium chloride 0.9 % 100 mL IVPB  100 mg Intravenous Daily Domenic Polite, MD 200 mL/hr at 10/14/19 1223 100 mg at 10/14/19 1223  . sodium chloride flush (NS) 0.9 % injection 3 mL  3 mL Intravenous Once Rise Patience, MD         Discharge Medications: Please see discharge summary for a list of discharge medications.  Relevant Imaging Results:  Relevant Lab Results:   Additional Information SSN: [id number]  [place], [place]

## 2019-10-14 NOTE — TOC Initial Note (Signed)
Transition of Care [facility]) - Initial/Assessment Note    Patient Details  Name: Jeanne [name] MRN: [id number] Date of Birth: 06/20/1936  [facility] CM/SW Contact:    Benard Halsted, LCSW Phone Number: 10/14/2019, 2:03 PM  Clinical Narrative:                 CSW received consult for possible SNF placement at time of discharge. CSW spoke with patient's daughters as requested regarding PT recommendation of SNF placement at time of discharge. Patient's daughters reported that they are currently unable to care for patient at their home given patient's current physical needs and fall risk. Patient's daughters expressed understanding of PT recommendation and are agreeable to SNF placement at time of discharge. Patient's daughters report preference for [place]. CSW discussed insurance authorization process and provided Medicare SNF ratings list. Patient's daughters expressed being hopeful for rehab and to feel better soon. No further questions reported at this time. CSW to continue to follow and assist with discharge planning needs.   Expected Discharge Plan: Skilled Nursing Facility Barriers to Discharge: Insurance Authorization   Patient Goals and CMS Choice Patient states their goals for this hospitalization and ongoing recovery are:: Rehab CMS Medicare.gov Compare Post Acute Care list provided to:: Patient Choice offered to / list presented to : Adult Children  Expected Discharge Plan and Services Expected Discharge Plan: [place] In-house Referral: Clinical Social Work   Post Acute Care Choice: [facility] Living arrangements for the past 2 months: [place]                                      Prior Living Arrangements/Services Living arrangements for the past 2 months: Single Family Home Lives with:: Adult Children Patient language and need for interpreter reviewed:: Yes Do you feel safe going back to the place where you  live?: Yes      Need for Family Participation in Patient Care: Yes (Comment) Care giver support system in place?: Yes (comment)   Criminal Activity/Legal Involvement Pertinent to Current Situation/Hospitalization: No - Comment as needed  Activities of Daily Living Home Assistive Devices/Equipment: Cane (specify quad or straight), Eyeglasses ADL Screening (condition at time of admission) Patient's cognitive ability adequate to safely complete daily activities?: Yes Is the patient deaf or have difficulty hearing?: No Does the patient have difficulty seeing, even when wearing glasses/contacts?: No(wear glasses ) Does the patient have difficulty concentrating, remembering, or making decisions?: No Patient able to express need for assistance with ADLs?: Yes Does the patient have difficulty dressing or bathing?: Yes(need assist ) Independently performs ADLs?: No Communication: Independent Dressing (OT): Needs assistance Is this a change from baseline?: Pre-admission baseline Grooming: Needs assistance Is this a change from baseline?: Pre-admission baseline Feeding: Independent Bathing: Needs assistance Is this a change from baseline?: Pre-admission baseline Toileting: Needs assistance Is this a change from baseline?: Pre-admission baseline In/Out Bed: Needs assistance Is this a change from baseline?: Pre-admission baseline Walks in Home: Needs assistance(use cane) Is this a change from baseline?: Pre-admission baseline Does the patient have difficulty walking or climbing stairs?: Yes Weakness of Legs: Both Weakness of Arms/Hands: Both  Permission Sought/Granted Permission sought to share information with : Facility Sport and exercise psychologist, Family Supports Permission granted to share information with : Yes, Verbal Permission Granted  Share Information with NAME: [place]i  Permission granted to share info w AGENCY: SNFs  Permission granted to share info w Relationship:  Daughters  Permission granted to share info w Contact Information: [phone]  Emotional Assessment       Orientation: : Oriented to Self, Oriented to Place, Oriented to  Time, Oriented to Situation Alcohol / Substance Use: Not Applicable Psych Involvement: No (comment)  Admission diagnosis:  Acute respiratory failure with hypoxia ([place]) [J96.01] Patient Active Problem List   Diagnosis Date Noted  . Acute respiratory failure with hypoxia ([place]) 10/11/2019  . CAP (community acquired pneumonia) 10/11/2019  . Diastolic dysfunction, left ventricle 04/05/2019  . Atherosclerosis of native coronary artery of native heart without angina pectoris 11/23/2018  . Hypercholesteremia 11/23/2018  . Unstable angina ([place]) 10/12/2015  . Abnormal myocardial perfusion study 10/12/2015  . NSVT (nonsustained ventricular tachycardia) ([place]) 10/11/2015  . Hypertension with heart disease 10/11/2015  . CKD (chronic kidney disease), stage III 10/11/2015  . Pain in the chest   . Chest pain 10/08/2015  . Diabetes mellitus type II, non insulin dependent ([place]) 10/08/2015  . AKI (acute kidney injury) ([place])   . Abscess of left arm 01/01/2013  . Migraine   . Morbid obesity ([place])   . Sleep apnea   . Stenosis of left carotid artery   . Reflux   . Anemia    PCP:  Seward Carol, MD Pharmacy:   [id number] - [place], [place] [address] [place] [address] Phone: [phone] Fax: [phone]     Social Determinants of Health (SDOH) Interventions    Readmission Risk Interventions No flowsheet data found.

## 2019-10-14 NOTE — Progress Notes (Signed)
Called and spoke to both Pt's daughters over phone. Gave updates and answered all questions/concerns. Will continue to monitor and update as needed.

## 2019-10-14 NOTE — Progress Notes (Addendum)
PROGRESS NOTE                                                                                                                                                                                                             Patient Demographics:    Jeanne [name], is a 84 y.o. female, DOB - [birth year], DHR:[id number]  Outpatient Primary MD for the patient is Seward Carol, MD   Admit date - 10/10/2019   LOS - 3  Chief Complaint  Patient presents with  . Weakness       Brief Narrative: Patient is a 84 y.o. female with PMHx of morbid obesity, DM-2, CKD stage IV, OSA/OHS on CPAP, chronic diastolic heart failure, HTN, HLD who presented with shortness of breath and fever-found to have COVID-19 pneumonia and concurrent bacterial pneumonia causing acute hypoxemic respiratory failure.  See below for further details.   Subjective:    Jeanne [name] today is better-denies any shortness of breath.  Lying comfortably in bed.  Some cough.   Assessment  & Plan :   Acute Hypoxic Resp Failure due to Covid 19 Viral pneumonia: Improved-very mild hypoxemia today-CRP still significantly elevated-plans are to continue steroids (will increase dosage)/remdesivir/empiric IV antibiotics.  Blood cultures 2/6 neg so far.  Fever: afebrile  O2 requirements:  SpO2: 95 % O2 Flow Rate (L/min): 2 L/min   COVID-19 Labs: Recent Labs    10/13/19 1026 10/14/19 0508  DDIMER 3.12* 3.27*  FERRITIN 289 464*  CRP 16.0* 22.4*       Component Value Date/Time   BNP 178.7 (H) 10/11/2019 0058    Recent Labs  Lab 10/11/19 0131 10/13/19 0517  PROCALCITON 0.26 0.50    Lab Results  Component Value Date   SARSCOV2NAA POSITIVE (A) 10/13/2019   [place] NEGATIVE 10/11/2019   SARSCOV2NAA NOT DETECTED 10/10/2019   [place] Not Detected 07/25/2019     COVID-19 Medications: Steroids:2/8>> Remdesivir:2/8>>  Antibiotics:  Ceftriaxone:  2/5>> Zithromax: 2/5>>  Prone/Incentive Spirometry: encouraged  incentive spirometry use 3-4/hour.  DVT Prophylaxis  :  Lovenox   Chronic diastolic heart failure: Euvolemic-continue Lasix.  CKD stage IV: Creatinine not far from usual baseline-avoid nephrotoxic agents-follow.  DM-2: CBG stable-continue 65 units daily and SSI.  Recent Labs    10/13/19 2203 10/14/19 0742 10/14/19 1152  GLUCAP 115* 132* 207*   HLD: Continue Zetia and  statin  HTN: Controlled-continue hydralazine, diltiazem  OSA: CPAP ordered if possible otherwise will need to be placed on O2 at night.   Obesity: Estimated body mass index is 57.49 kg/m as calculated from the following:   Height as of this encounter: 5\' 3"  (1.6 m).   Weight as of this encounter: 147.2 kg.   Consults  :  None  Procedures  :  None ABG:    Component Value Date/Time   TCO2 32 02/02/2013 1025    Vent Settings: N/A  Condition - Guarded  Family Communication  : Voicemail left for daughter Maryagnes Carrasco on 2/9.  Addendum: Daughter called back-I spoke to her on the phone  Code Status :  Full Code  Diet :  Diet Order            Diet heart healthy/carb modified Room service appropriate? Yes; Fluid consistency: Thin; Fluid restriction: 1200 mL Fluid  Diet effective now               Disposition Plan  :  Remain hospitalized-Home with home health or SNF depending on clinical trajectory/PT evaluation  Barriers to discharge: Hypoxia requiring O2 supplementation/complete 5 days of IV Remdesivir  Antimicorbials  :    Anti-infectives (From admission, onward)   Start     Dose/Rate Route Frequency Ordered Stop   10/14/19 1000  remdesivir 100 mg in sodium chloride 0.9 % 100 mL IVPB     100 mg 200 mL/hr over 30 Minutes Intravenous Daily 10/13/19 1751 10/18/19 0959   10/13/19 1800  remdesivir 200 mg in sodium chloride 0.9% 250 mL IVPB     200 mg 580 mL/hr over 30 Minutes Intravenous Once 10/13/19 1751 10/13/19 1939    10/11/19 0800  azithromycin (ZITHROMAX) 500 mg in sodium chloride 0.9 % 250 mL IVPB     500 mg 250 mL/hr over 60 Minutes Intravenous Every 24 hours 10/11/19 0545 10/16/19 0759   10/11/19 0600  cefTRIAXone (ROCEPHIN) 2 g in sodium chloride 0.9 % 100 mL IVPB     2 g 200 mL/hr over 30 Minutes Intravenous Every 24 hours 10/11/19 0545 10/16/19 0544      Inpatient Medications  Scheduled Meds: . aspirin EC  81 mg Oral Daily  . dexamethasone (DECADRON) injection  6 mg Intravenous Q24H  . diltiazem  240 mg Oral Daily  . enoxaparin (LOVENOX) injection  0.5 mg/kg Subcutaneous Q24H  . ezetimibe  10 mg Oral QPM  . furosemide  20 mg Oral Daily  . hydrALAZINE  25 mg Oral TID  . insulin aspart  0-9 Units Subcutaneous TID WC  . insulin glargine  65 Units Subcutaneous Daily  . latanoprost  1 drop Left Eye QHS  . oxybutynin  10 mg Oral Daily  . pantoprazole  40 mg Oral Daily  . pravastatin  40 mg Oral q1800  . sodium chloride flush  3 mL Intravenous Once   Continuous Infusions: . azithromycin 500 mg (10/14/19 1000)  . cefTRIAXone (ROCEPHIN)  IV Stopped (10/14/19 0536)  . remdesivir 100 mg in NS 100 mL 100 mg (10/14/19 1223)   PRN Meds:.acetaminophen **OR** acetaminophen, nitroGLYCERIN, ondansetron **OR** ondansetron (ZOFRAN) IV   Time Spent in minutes  25  See all Orders from today for further details   Oren Binet M.D on 10/14/2019 at 1:03 PM  To page go to [email or web address] - use universal password  [facility] -  Office  [phone]    Objective:   Vitals:   10/13/19 2000 10/14/19 0000  10/14/19 0400 10/14/19 0500  BP: 129/65 137/70 (!) 151/71   Pulse: 81 78 78   Resp:  18 16   Temp: 99.3 F (37.4 C) 98.2 F (36.8 C) 98.4 F (36.9 C)   TempSrc: Oral Oral Oral   SpO2: 97% 99% 95%   Weight:    (!) 147.2 kg  Height:        Wt Readings from Last 3 Encounters:  10/14/19 (!) 147.2 kg  07/22/19 (!) 147.9 kg  07/16/19 (!) 146.6 kg     Intake/Output Summary (Last 24  hours) at 10/14/2019 1303 Last data filed at 10/14/2019 0643 Gross per 24 hour  Intake 441.53 ml  Output 950 ml  Net -508.47 ml     Physical Exam Gen Exam:Alert awake-not in any distress HEENT:atraumatic, normocephalic Chest: B/L clear to auscultation anteriorly CVS:S1S2 regular Abdomen:soft non tender, non distended Extremities:no edema Neurology: Non focal Skin: no rash   Data Review:    CBC Recent Labs  Lab 10/10/19 1859 10/11/19 0738 10/12/19 0331 10/13/19 0517 10/14/19 0508  WBC 4.3 4.1 4.7 4.8 5.3  HGB 11.9* 12.6 11.7* 12.4 11.2*  HCT 37.7 39.3 36.0 39.0 36.1  PLT 152 106* 128* 106* 139*  MCV 87.5 85.6 84.1 85.9 87.0  MCH 27.6 27.5 27.3 27.3 27.0  MCHC 31.6 32.1 32.5 31.8 31.0  RDW 13.7 13.9 13.8 13.9 14.2  LYMPHSABS  --  1.2  --   --   --   MONOABS  --  0.5  --   --   --   EOSABS  --  0.0  --   --   --   BASOSABS  --  0.0  --   --   --     Chemistries  Recent Labs  Lab 10/10/19 1859 10/11/19 0738 10/12/19 0331 10/13/19 0517 10/14/19 0508  NA 132* 136 133* 134* 135  K 3.5 3.4* 3.1* 3.5 3.5  CL 90* 93* 91* 95* 93*  CO2 28 27 27 25 23   GLUCOSE 224* 162* 160* 113* 144*  BUN 37* 40* 37* 38* 40*  CREATININE 2.32* 2.19* 2.04* 1.84* 2.00*  CALCIUM 8.8* 8.8* 8.4* 8.6* 8.3*  AST  --  29 37  --  48*  ALT  --  16 17  --  23  ALKPHOS  --  43 40  --  34*  BILITOT  --  0.4 0.5  --  0.5   ------------------------------------------------------------------------------------------------------------------ No results for input(s): CHOL, HDL, LDLCALC, TRIG, CHOLHDL, LDLDIRECT in the last 72 hours.  Lab Results  Component Value Date   HGBA1C 6.6 (H) 10/09/2015   ------------------------------------------------------------------------------------------------------------------ No results for input(s): TSH, T4TOTAL, T3FREE, THYROIDAB in the last 72 hours.  Invalid input(s):  FREET3 ------------------------------------------------------------------------------------------------------------------ Recent Labs    10/13/19 1026 10/14/19 0508  FERRITIN 289 464*    Coagulation profile No results for input(s): INR, PROTIME in the last 168 hours.  Recent Labs    10/13/19 1026 10/14/19 0508  DDIMER 3.12* 3.27*    Cardiac Enzymes No results for input(s): CKMB, TROPONINI, MYOGLOBIN in the last 168 hours.  Invalid input(s): CK ------------------------------------------------------------------------------------------------------------------    Component Value Date/Time   BNP 178.7 (H) 10/11/2019 0058    Micro Results Recent Results (from the past 240 hour(s))  Novel Coronavirus, NAA (Hosp order, Send-out to Ref Lab; TAT 18-24 hrs     Status: None   Collection Time: 10/10/19  5:20 PM   Specimen: Nasopharyngeal Swab; Respiratory  Result Value Ref Range Status   SARS-CoV-2,  NAA NOT DETECTED NOT DETECTED Final    Comment: (NOTE) This nucleic acid amplification test was developed and its performance characteristics determined by [employer]. Nucleic acid amplification tests include RT-PCR and TMA. This test has not been FDA cleared or approved. This test has been authorized by FDA under an Emergency Use Authorization (EUA). This test is only authorized for the duration of time the declaration that circumstances exist justifying the authorization of the emergency use of in vitro diagnostic tests for detection of SARS-CoV-2 virus and/or diagnosis of COVID-19 infection under section 564(b)(1) of the Act, 21 U.S.C. 756EPP-2(R) (1), unless the authorization is terminated or revoked sooner. When diagnostic testing is negative, the possibility of a false negative result should be considered in the context of a patient's recent exposures and the presence of clinical signs and symptoms consistent with COVID-19. An individual without symptoms of COVID- 19  and who is not shedding SARS-CoV-2  virus would expect to have a negative (not detected) result in this assay. Performed At: [facility] [address] [place], [place] [id number] Rush Farmer MD YT:[id number]    [place]  Final    Comment: Performed at [facility], [place] [address] [place], [place] [address]  Respiratory Panel by RT PCR (Flu A&B, Covid) - Nasopharyngeal Swab     Status: None   Collection Time: 10/11/19  1:34 AM   Specimen: Nasopharyngeal Swab  Result Value Ref Range Status   SARS Coronavirus 2 by RT PCR NEGATIVE NEGATIVE Final    Comment: (NOTE) SARS-CoV-2 target nucleic acids are NOT DETECTED. The SARS-CoV-2 RNA is generally detectable in upper respiratoy specimens during the acute phase of infection. The lowest concentration of SARS-CoV-2 viral copies this assay can detect is 131 copies/mL. A negative result does not preclude SARS-Cov-2 infection and should not be used as the sole basis for treatment or other patient management decisions. A negative result may occur with  improper specimen collection/handling, submission of specimen other than nasopharyngeal swab, presence of viral mutation(s) within the areas targeted by this assay, and inadequate number of viral copies (<131 copies/mL). A negative result must be combined with clinical observations, patient history, and epidemiological information. The expected result is Negative. Fact Sheet for Patients:  PinkCheek.be Fact Sheet for Healthcare Providers:  GravelBags.it This test is not yet ap proved or cleared by the [place] FDA and  has been authorized for detection and/or diagnosis of SARS-CoV-2 by FDA under an Emergency Use Authorization (EUA). This EUA will remain  in effect (meaning this test can be used) for the duration of the COVID-19 declaration under Section 564(b)(1) of the Act, 21  U.S.C. section 360bbb-3(b)(1), unless the authorization is terminated or revoked sooner.    Influenza A by PCR NEGATIVE NEGATIVE Final   Influenza B by PCR NEGATIVE NEGATIVE Final    Comment: (NOTE) The Xpert Xpress SARS-CoV-2/FLU/RSV assay is intended as an aid in  the diagnosis of influenza from Nasopharyngeal swab specimens and  should not be used as a sole basis for treatment. Nasal washings and  aspirates are unacceptable for Xpert Xpress SARS-CoV-2/FLU/RSV  testing. Fact Sheet for Patients: PinkCheek.be Fact Sheet for Healthcare Providers: GravelBags.it This test is not yet approved or cleared by the [place] FDA and  has been authorized for detection and/or diagnosis of SARS-CoV-2 by  FDA under an Emergency Use Authorization (EUA). This EUA will remain  in effect (meaning this test can be used) for the duration of the  Covid-19  declaration under Section 564(b)(1) of the Act, 21  U.S.C. section 360bbb-3(b)(1), unless the authorization is  terminated or revoked. Performed at [facility], [place] [address] [place], [place] [address]   Blood Culture (routine x 2)     Status: None (Preliminary result)   Collection Time: 10/11/19  2:00 AM   Specimen: BLOOD  Result Value Ref Range Status   Specimen Description BLOOD LEFT ANTECUBITAL  Final   Special Requests   Final    BOTTLES DRAWN AEROBIC AND ANAEROBIC Blood Culture adequate volume   Culture   Final    NO GROWTH 3 DAYS Performed at [facility], [place] [address] [place], [place] [address]    Report Status PENDING  Incomplete  Blood Culture (routine x 2)     Status: None (Preliminary result)   Collection Time: 10/11/19  2:00 AM   Specimen: BLOOD  Result Value Ref Range Status   Specimen Description BLOOD RIGHT ANTECUBITAL  Final   Special Requests   Final    BOTTLES DRAWN AEROBIC AND ANAEROBIC Blood Culture adequate volume   Culture   Final    NO  GROWTH 3 DAYS Performed at [facility], [place] [address], [place], [place] [address]    Report Status PENDING  Incomplete  Culture, blood (routine x 2)     Status: None (Preliminary result)   Collection Time: 10/11/19  4:10 PM   Specimen: BLOOD LEFT HAND  Result Value Ref Range Status   Specimen Description BLOOD LEFT HAND  Final   Special Requests   Final    BOTTLES DRAWN AEROBIC ONLY Blood Culture results may not be optimal due to an inadequate volume of blood received in culture bottles   Culture   Final    NO GROWTH 3 DAYS Performed at [facility], [place] [address], [place], [place] [address]    Report Status PENDING  Incomplete  Culture, blood (routine x 2)     Status: None (Preliminary result)   Collection Time: 10/11/19  4:11 PM   Specimen: BLOOD LEFT HAND  Result Value Ref Range Status   Specimen Description BLOOD LEFT HAND  Final   Special Requests   Final    BOTTLES DRAWN AEROBIC ONLY Blood Culture results may not be optimal due to an inadequate volume of blood received in culture bottles   Culture   Final    NO GROWTH 3 DAYS Performed at [facility], [place] [address] [place], [place] [address]    Report Status PENDING  Incomplete  SARS CORONAVIRUS 2 (TAT 6-24 HRS) Nasopharyngeal Nasopharyngeal Swab     Status: Abnormal   Collection Time: 10/13/19 11:50 AM   Specimen: Nasopharyngeal Swab  Result Value Ref Range Status   SARS Coronavirus 2 POSITIVE (A) NEGATIVE Final    Comment: RESULT CALLED TO, READ BACK BY AND VERIFIED WITH: N.GOMEZ RN [id number] 10/13/19 MCCORMICK K (NOTE) SARS-CoV-2 target nucleic acids are DETECTED. The SARS-CoV-2 RNA is generally detectable in upper and lower respiratory specimens during the acute phase of infection. Positive results are indicative of the presence of SARS-CoV-2 RNA. Clinical correlation with patient history and other diagnostic information is  necessary to determine patient infection status. Positive results do not  rule out bacterial infection or co-infection with other viruses.  The expected result is Negative. Fact Sheet for Patients: SugarRoll.be Fact Sheet for Healthcare Providers: https://www.woods-mathews.com/ This test is not yet approved or cleared by the [place] FDA and  has been authorized for detection and/or diagnosis of SARS-CoV-2 by FDA under an Emergency Use Authorization (EUA). This EUA will remain  in effect (meaning this test can be used) for th e duration of the COVID-19 declaration under Section 564(b)(1) of the Act, 21 U.S.C. section 360bbb-3(b)(1), unless the authorization is terminated or revoked sooner. Performed at [facility], [place] [address], [place], [place] [address]     Radiology Reports DG Chest 2 View  Result Date: 10/12/2019 CLINICAL DATA:  Fever, cough. EXAM: CHEST - 2 VIEW COMPARISON:  October 10, 2019. FINDINGS: Stable cardiomediastinal silhouette. No pneumothorax or pleural effusion is noted. Right lung is clear. Mild left midlung and basilar opacity is noted concerning for possible pneumonia or atelectasis which appears to be stable. Bony thorax is unremarkable. IMPRESSION: Stable mild left midlung and basilar opacity is noted concerning for pneumonia or atelectasis. Electronically Signed   By: Marijo Conception M.D.   On: 10/12/2019 13:47   DG Chest 2 View  Result Date: 10/10/2019 CLINICAL DATA:  Shortness of breath. EXAM: CHEST - 2 VIEW COMPARISON:  None. FINDINGS: Mild, chronic appearing increased interstitial lung markings are seen. Very mild atelectasis are and/or early infiltrate is seen within the mid left lung. There is no evidence of a pleural effusion or pneumothorax. The heart size and mediastinal contours are within normal limits. There is mild calcification of the aortic arch. Degenerative changes seen throughout the thoracic spine. IMPRESSION: Chronic appearing increased interstitial lung markings with  very mild atelectasis and/or early infiltrate within the mid left lung. Electronically Signed   By: Virgina Norfolk M.D.   On: 10/10/2019 18:05   NM Pulmonary Perf and Vent  Result Date: 10/11/2019 CLINICAL DATA:  84 year old presenting with a 4 day history of shortness of breath, cough and generalized weakness. Current history of stage 3 chronic kidney disease which precludes IV contrast administration. EXAM: NUCLEAR MEDICINE VENTILATION - PERFUSION LUNG SCAN TECHNIQUE: Ventilation images were obtained in multiple projections using inhaled aerosol Tc-21m DTPA. Perfusion images were obtained in multiple projections after intravenous injection of Tc-31m MAA. RADIOPHARMACEUTICALS:  Thirty-eight mCi of Tc-1m DTPA aerosol inhalation and 1.4 mCi Tc23m MAA IV. COMPARISON:  No prior nuclear imaging. Chest x-ray yesterday is correlated. FINDINGS: Ventilation: No focal ventilatory abnormality. Overall slight diminished ventilation in the LEFT lung relative to the RIGHT lung. Perfusion: No wedge shaped peripheral perfusion defects to suggest acute pulmonary embolism. Overall slight diminished perfusion to the LEFT lung relative to the RIGHT lung, matching the above ventilation. IMPRESSION: Low probability of pulmonary embolism. Electronically Signed   By: Evangeline Dakin M.D.   On: 10/11/2019 14:09

## 2019-10-14 NOTE — Progress Notes (Signed)
Pt can't  have CPAP due to COVID positive.

## 2019-10-14 NOTE — Progress Notes (Signed)
Physical Therapy Treatment Patient Details Name: Jeanne [name] MRN: [id number] DOB: [birth year] Today's Date: 10/14/2019    History of Present Illness Patient is a 84 y.o. female with PMHx of morbid obesity, DM-2, CKD stage IV, OSA/OHS on CPAP, chronic diastolic heart failure, HTN, HLD who presented with shortness of breath and fever-found to have COVID-19 pneumonia and concurrent bacterial pneumonia causing acute hypoxemic respiratory failure.     PT Comments    Pt able to progress mobility with therapy today, however continues to be limited in safe mobility by decreased strength and endurance. Pt requires modA for bed mobility, and minAx2 for transfers and ambulation of 10 feet with RW. D/c plans remain appropriate at this time. PT will continue to follow acutely.     Follow Up Recommendations  SNF     Equipment Recommendations  None recommended by PT    Recommendations for Other Services       Precautions / Restrictions Precautions Precautions: Fall;Other (comment)(Airborne) Precaution Comments: monitor O2 sats Restrictions Weight Bearing Restrictions: No    Mobility  Bed Mobility Overal bed mobility: Needs Assistance Bed Mobility: Supine to Sit     Supine to sit: Mod assist     General bed mobility comments: Able to advance B LEs, but required assistance and cues for trunk control  Transfers Overall transfer level: Needs assistance Equipment used: Rolling walker (2 wheeled) Transfers: Sit to/from [facility] Sit to Stand: Min assist;+2 physical assistance;+2 safety/equipment Stand pivot transfers: Min guard;+2 physical assistance;+2 safety/equipment       General transfer comment: Required repeated directions for initiation   Ambulation/Gait Ambulation/Gait assistance: Min assist;+2 physical assistance;+2 safety/equipment Gait Distance (Feet): 10 Feet Assistive device: Rolling walker (2 wheeled) Gait Pattern/deviations: Step-through  pattern;Decreased step length - right;Decreased step length - left;Shuffle;Trunk flexed Gait velocity: slowed Gait velocity interpretation: <1.31 ft/sec, indicative of household ambulator General Gait Details: minA for steadying with slowed waddling gait, vc for upright posture and proximity to RW       Balance Overall balance assessment: Mild deficits observed, not formally tested Sitting-balance support: Feet supported;Single extremity supported Sitting balance-Leahy Scale: Fair       Standing balance-Leahy Scale: Poor                              Cognition Arousal/Alertness: Lethargic Behavior During Therapy: Flat affect Overall Cognitive Status: Within Functional Limits for tasks assessed                                 General Comments: Difficulty with initiation and attention at times         General Comments General comments (skin integrity, edema, etc.): Pt on RA dropped to 81%O2 with poor pleth waveform, quickly recovered to >90%O2 with sitting      Pertinent Vitals/Pain Pain Assessment: No/denies pain           PT Goals (current goals can now be found in the care plan section) Acute Rehab PT Goals Patient Stated Goal: to go home PT Goal Formulation: With patient Time For Goal Achievement: 10/27/19 Potential to Achieve Goals: Good Progress towards PT goals: Progressing toward goals    Frequency    Min 3X/week      PT Plan Current plan remains appropriate       AM-PAC PT "6 Clicks" Mobility   Outcome Measure  Help needed turning from  your back to your side while in a flat bed without using bedrails?: A Lot Help needed moving from lying on your back to sitting on the side of a flat bed without using bedrails?: A Lot Help needed moving to and from a bed to a chair (including a wheelchair)?: A Lot Help needed standing up from a chair using your arms (e.g., wheelchair or bedside chair)?: A Little Help needed to walk in  hospital room?: A Little Help needed climbing 3-5 steps with a railing? : Total 6 Click Score: 13    End of Session   Activity Tolerance: Patient tolerated treatment well Patient left: in chair;with call bell/phone within reach;with chair alarm set Nurse Communication: Mobility status PT Visit Diagnosis: Muscle weakness (generalized) (M62.81);Difficulty in walking, not elsewhere classified (R26.2);Pain Pain - part of body: Knee     Time: 0623-7628 PT Time Calculation (min) (ACUTE ONLY): 35 min  Charges:  $Gait Training: 8-22 mins                     Lorita Forinash B. Migdalia Dk PT, DPT Acute Rehabilitation Services Pager [phone] Office [phone]    [place] 10/14/2019, 3:36 PM

## 2019-10-15 LAB — COMPREHENSIVE METABOLIC PANEL
ALT: 26 U/L (ref 0–44)
AST: 52 U/L — ABNORMAL HIGH (ref 15–41)
Albumin: 2.3 g/dL — ABNORMAL LOW (ref 3.5–5.0)
Alkaline Phosphatase: 32 U/L — ABNORMAL LOW (ref 38–126)
Anion gap: 13 (ref 5–15)
BUN: 46 mg/dL — ABNORMAL HIGH (ref 8–23)
CO2: 26 mmol/L (ref 22–32)
Calcium: 8.8 mg/dL — ABNORMAL LOW (ref 8.9–10.3)
Chloride: 95 mmol/L — ABNORMAL LOW (ref 98–111)
Creatinine, Ser: 1.91 mg/dL — ABNORMAL HIGH (ref 0.44–1.00)
GFR calc Af Amer: 28 mL/min — ABNORMAL LOW (ref >60)
GFR calc non Af Amer: 24 mL/min — ABNORMAL LOW (ref >60)
Glucose, Bld: 232 mg/dL — ABNORMAL HIGH (ref 70–99)
Potassium: 3.5 mmol/L (ref 3.5–5.1)
Sodium: 134 mmol/L — ABNORMAL LOW (ref 135–145)
Total Bilirubin: 0.6 mg/dL (ref 0.3–1.2)
Total Protein: 6.7 g/dL (ref 6.5–8.1)

## 2019-10-15 LAB — GLUCOSE, CAPILLARY
Glucose-Capillary: 244 mg/dL — ABNORMAL HIGH (ref 70–99)
Glucose-Capillary: 235 mg/dL — ABNORMAL HIGH (ref 70–99)
Glucose-Capillary: 248 mg/dL — ABNORMAL HIGH (ref 70–99)
Glucose-Capillary: 229 mg/dL — ABNORMAL HIGH (ref 70–99)

## 2019-10-15 LAB — FERRITIN: Ferritin: 639 ng/mL — ABNORMAL HIGH (ref 11–307)

## 2019-10-15 LAB — D-DIMER, QUANTITATIVE: D-Dimer, Quant: 2.99 ug/mL-FEU — ABNORMAL HIGH (ref 0.00–0.50)

## 2019-10-15 LAB — C-REACTIVE PROTEIN: CRP: 19.5 mg/dL — ABNORMAL HIGH (ref <1.0)

## 2019-10-15 NOTE — Progress Notes (Signed)
Patient cannot have CPAP due to COVID positive.

## 2019-10-15 NOTE — Progress Notes (Addendum)
PROGRESS NOTE                                                                                                                                                                                                             Patient Demographics:    Jeanne [name], is a 84 y.o. female, DOB - Apr 18, 1936, WNI:[id number]  Outpatient Primary MD for the patient is Seward Carol, MD   Admit date - 10/10/2019   LOS - 4  Chief Complaint  Patient presents with  . Weakness       Brief Narrative: Patient is a 84 y.o. female with PMHx of morbid obesity, DM-2, CKD stage IV, OSA/OHS on CPAP, chronic diastolic heart failure, HTN, HLD who presented with shortness of breath and fever-found to have COVID-19 pneumonia and concurrent bacterial pneumonia causing acute hypoxemic respiratory failure.  See below for further details.   Subjective:    Jeanne [name] feels much better-she was on room air this morning.  Denies any chest pain or shortness of breath.   Assessment  & Plan :   Acute Hypoxic Resp Failure due to Covid 19 Viral pneumonia and concurrent bacterial pneumonia: Continues to improve-was titrated to room air early this morning-CRP still elevated but now decreasing-continue with steroids/remdesivir.  Has completed a course of empiric antibiotics.  Blood cultures 2/6 neg so far.   Fever: afebrile  O2 requirements:  SpO2: 94 % O2 Flow Rate (L/min): 2 L/min   COVID-19 Labs: Recent Labs    10/13/19 1026 10/14/19 0508 10/15/19 0457  DDIMER 3.12* 3.27* 2.99*  FERRITIN 289 464* 639*  CRP 16.0* 22.4* 19.5*       Component Value Date/Time   BNP 178.7 (H) 10/11/2019 0058    Recent Labs  Lab 10/11/19 0131 10/13/19 0517  PROCALCITON 0.26 0.50    Lab Results  Component Value Date   SARSCOV2NAA POSITIVE (A) 10/13/2019   [place] NEGATIVE 10/11/2019   SARSCOV2NAA NOT DETECTED 10/10/2019   [place] Not Detected  07/25/2019     COVID-19 Medications: Steroids:2/8>> Remdesivir:2/8>>  Antibiotics:  Ceftriaxone: 2/5>> 2/9 Zithromax: 2/5>> 2/9  Prone/Incentive Spirometry: encouraged  incentive spirometry use 3-4/hour.  DVT Prophylaxis  :  Lovenox   Chronic diastolic heart failure: Euvolemic-continue Lasix.  CKD stage IV: Creatinine not far from usual baseline-avoid nephrotoxic agents-follow.  DM-2: CBG stable-continue 65 units daily and  SSI.  Recent Labs    10/14/19 1611 10/15/19 0824 10/15/19 1224  GLUCAP 146* 244* 235*   HLD: Continue Zetia and statin  HTN: Controlled-continue hydralazine, diltiazem  OSA: CPAP ordered if possible otherwise will need to be placed on O2 at night.   Obesity: Estimated body mass index is 57.49 kg/m as calculated from the following:   Height as of this encounter: 5\' 3"  (1.6 m).   Weight as of this encounter: 147.2 kg.   Consults  :  None  Procedures  :  None ABG:    Component Value Date/Time   TCO2 32 02/02/2013 1025    Vent Settings: N/A  Condition - Guarded  Family Communication  : Spoke with daughter Abrar Koone on 2/10  Code Status :  Full Code  Diet :  Diet Order            Diet heart healthy/carb modified Room service appropriate? Yes; Fluid consistency: Thin; Fluid restriction: 1200 mL Fluid  Diet effective now               Disposition Plan  :  Remain hospitalized- SNF on discharge-likely on 2/12  Barriers to discharge: Complete 5 days of IV Remdesivir  Antimicorbials  :    Anti-infectives (From admission, onward)   Start     Dose/Rate Route Frequency Ordered Stop   10/14/19 1000  remdesivir 100 mg in sodium chloride 0.9 % 100 mL IVPB     100 mg 200 mL/hr over 30 Minutes Intravenous Daily 10/13/19 1751 10/18/19 0959   10/13/19 1800  remdesivir 200 mg in sodium chloride 0.9% 250 mL IVPB     200 mg 580 mL/hr over 30 Minutes Intravenous Once 10/13/19 1751 10/13/19 1939   10/11/19 0800  azithromycin (ZITHROMAX)  500 mg in sodium chloride 0.9 % 250 mL IVPB     500 mg 250 mL/hr over 60 Minutes Intravenous Every 24 hours 10/11/19 0545 10/15/19 0915   10/11/19 0600  cefTRIAXone (ROCEPHIN) 2 g in sodium chloride 0.9 % 100 mL IVPB     2 g 200 mL/hr over 30 Minutes Intravenous Every 24 hours 10/11/19 0545 10/15/19 0606      Inpatient Medications  Scheduled Meds: . aspirin EC  81 mg Oral Daily  . dexamethasone (DECADRON) injection  10 mg Intravenous Q24H  . diltiazem  240 mg Oral Daily  . enoxaparin (LOVENOX) injection  0.5 mg/kg Subcutaneous Q24H  . ezetimibe  10 mg Oral QPM  . furosemide  20 mg Oral Daily  . hydrALAZINE  25 mg Oral TID  . insulin aspart  0-9 Units Subcutaneous TID WC  . insulin glargine  65 Units Subcutaneous Daily  . latanoprost  1 drop Left Eye QHS  . oxybutynin  10 mg Oral Daily  . pantoprazole  40 mg Oral Daily  . pravastatin  40 mg Oral q1800  . sodium chloride flush  3 mL Intravenous Once   Continuous Infusions: . remdesivir 100 mg in NS 100 mL 100 mg (10/15/19 1040)   PRN Meds:.acetaminophen **OR** acetaminophen, nitroGLYCERIN, ondansetron **OR** ondansetron (ZOFRAN) IV   Time Spent in minutes  25  See all Orders from today for further details   Oren Binet M.D on 10/15/2019 at 2:13 PM  To page go to [email or web address] - use universal password  [facility] -  Office  [phone]    Objective:   Vitals:   10/14/19 2135 10/15/19 0547 10/15/19 0800 10/15/19 1200  BP: 139/66 128/67 128/70 (!) 108/54  Pulse:  69 66 64  Resp:  19  (!) 22  Temp:  97.9 F (36.6 C)  97.7 F (36.5 C)  TempSrc:  Oral  Oral  SpO2:  97%  94%  Weight:      Height:        Wt Readings from Last 3 Encounters:  10/14/19 (!) 147.2 kg  07/22/19 (!) 147.9 kg  07/16/19 (!) 146.6 kg     Intake/Output Summary (Last 24 hours) at 10/15/2019 1413 Last data filed at 10/15/2019 1130 Gross per 24 hour  Intake 400 ml  Output 1150 ml  Net -750 ml     Physical Exam Gen  Exam:Alert awake-not in any distress HEENT:atraumatic, normocephalic Chest: B/L clear to auscultation anteriorly CVS:S1S2 regular Abdomen:soft non tender, non distended Extremities:no edema Neurology: Non focal Skin: no rash   Data Review:    CBC Recent Labs  Lab 10/10/19 1859 10/11/19 0738 10/12/19 0331 10/13/19 0517 10/14/19 0508  WBC 4.3 4.1 4.7 4.8 5.3  HGB 11.9* 12.6 11.7* 12.4 11.2*  HCT 37.7 39.3 36.0 39.0 36.1  PLT 152 106* 128* 106* 139*  MCV 87.5 85.6 84.1 85.9 87.0  MCH 27.6 27.5 27.3 27.3 27.0  MCHC 31.6 32.1 32.5 31.8 31.0  RDW 13.7 13.9 13.8 13.9 14.2  LYMPHSABS  --  1.2  --   --   --   MONOABS  --  0.5  --   --   --   EOSABS  --  0.0  --   --   --   BASOSABS  --  0.0  --   --   --     Chemistries  Recent Labs  Lab 10/11/19 0738 10/12/19 0331 10/13/19 0517 10/14/19 0508 10/15/19 0457  NA 136 133* 134* 135 134*  K 3.4* 3.1* 3.5 3.5 3.5  CL 93* 91* 95* 93* 95*  CO2 27 27 25 23 26   GLUCOSE 162* 160* 113* 144* 232*  BUN 40* 37* 38* 40* 46*  CREATININE 2.19* 2.04* 1.84* 2.00* 1.91*  CALCIUM 8.8* 8.4* 8.6* 8.3* 8.8*  AST 29 37  --  48* 52*  ALT 16 17  --  23 26  ALKPHOS 43 40  --  34* 32*  BILITOT 0.4 0.5  --  0.5 0.6   ------------------------------------------------------------------------------------------------------------------ No results for input(s): CHOL, HDL, LDLCALC, TRIG, CHOLHDL, LDLDIRECT in the last 72 hours.  Lab Results  Component Value Date   HGBA1C 6.6 (H) 10/09/2015   ------------------------------------------------------------------------------------------------------------------ No results for input(s): TSH, T4TOTAL, T3FREE, THYROIDAB in the last 72 hours.  Invalid input(s): FREET3 ------------------------------------------------------------------------------------------------------------------ Recent Labs    10/14/19 0508 10/15/19 0457  FERRITIN 464* 639*    Coagulation profile No results for input(s): INR,  PROTIME in the last 168 hours.  Recent Labs    10/14/19 0508 10/15/19 0457  DDIMER 3.27* 2.99*    Cardiac Enzymes No results for input(s): CKMB, TROPONINI, MYOGLOBIN in the last 168 hours.  Invalid input(s): CK ------------------------------------------------------------------------------------------------------------------    Component Value Date/Time   BNP 178.7 (H) 10/11/2019 0058    Micro Results Recent Results (from the past 240 hour(s))  Novel Coronavirus, NAA (Hosp order, Send-out to Ref Lab; TAT 18-24 hrs     Status: None   Collection Time: 10/10/19  5:20 PM   Specimen: Nasopharyngeal Swab; Respiratory  Result Value Ref Range Status   SARS-CoV-2, NAA NOT DETECTED NOT DETECTED Final    Comment: (NOTE) This nucleic acid amplification test was developed and its performance characteristics determined by  [employer], [employer] and [employer]. Nucleic acid amplification tests include RT-PCR and TMA. This test has not been FDA cleared or approved. This test has been authorized by FDA under an Emergency Use Authorization (EUA). This test is only authorized for the duration of time the declaration that circumstances exist justifying the authorization of the emergency use of in vitro diagnostic tests for detection of SARS-CoV-2 virus and/or diagnosis of COVID-19 infection under section 564(b)(1) of the Act, 21 U.S.C. 440NUU-7(O) (1), unless the authorization is terminated or revoked sooner. When diagnostic testing is negative, the possibility of a false negative result should be considered in the context of a patient's recent exposures and the presence of clinical signs and symptoms consistent with COVID-19. An individual without symptoms of COVID- 19 and who is not shedding SARS-CoV-2  virus would expect to have a negative (not detected) result in this assay. Performed At: [facility] [address] [place], [place] [id number] Rush Farmer MD VQ:[id number]    [place]  Final    Comment: Performed at [facility], [place] [address] [place], [place] [address]  Respiratory Panel by RT PCR (Flu A&B, Covid) - Nasopharyngeal Swab     Status: None   Collection Time: 10/11/19  1:34 AM   Specimen: Nasopharyngeal Swab  Result Value Ref Range Status   SARS Coronavirus 2 by RT PCR NEGATIVE NEGATIVE Final    Comment: (NOTE) SARS-CoV-2 target nucleic acids are NOT DETECTED. The SARS-CoV-2 RNA is generally detectable in upper respiratoy specimens during the acute phase of infection. The lowest concentration of SARS-CoV-2 viral copies this assay can detect is 131 copies/mL. A negative result does not preclude SARS-Cov-2 infection and should not be used as the sole basis for treatment or other patient management decisions. A negative result may occur with  improper specimen collection/handling, submission of specimen other than nasopharyngeal swab, presence of viral mutation(s) within the areas targeted by this assay, and inadequate number of viral copies (<131 copies/mL). A negative result must be combined with clinical observations, patient history, and epidemiological information. The expected result is Negative. Fact Sheet for Patients:  PinkCheek.be Fact Sheet for Healthcare Providers:  GravelBags.it This test is not yet ap proved or cleared by the [place] FDA and  has been authorized for detection and/or diagnosis of SARS-CoV-2 by FDA under an Emergency Use Authorization (EUA). This EUA will remain  in effect (meaning this test can be used) for the duration of the COVID-19 declaration under Section 564(b)(1) of the Act, 21 U.S.C. section 360bbb-3(b)(1), unless the authorization is terminated or revoked sooner.    Influenza A by PCR NEGATIVE NEGATIVE Final   Influenza B by PCR NEGATIVE NEGATIVE Final    Comment: (NOTE) The Xpert Xpress SARS-CoV-2/FLU/RSV assay is  intended as an aid in  the diagnosis of influenza from Nasopharyngeal swab specimens and  should not be used as a sole basis for treatment. Nasal washings and  aspirates are unacceptable for Xpert Xpress SARS-CoV-2/FLU/RSV  testing. Fact Sheet for Patients: PinkCheek.be Fact Sheet for Healthcare Providers: GravelBags.it This test is not yet approved or cleared by the [place] FDA and  has been authorized for detection and/or diagnosis of SARS-CoV-2 by  FDA under an Emergency Use Authorization (EUA). This EUA will remain  in effect (meaning this test can be used) for the duration of the  Covid-19 declaration under Section 564(b)(1) of the Act, 21  U.S.C. section 360bbb-3(b)(1), unless the authorization is  terminated or revoked. Performed at [facility], [place] [address], [place], [place] [address]   Blood Culture (routine x 2)     Status: None (Preliminary result)   Collection Time: 10/11/19  2:00 AM   Specimen: BLOOD  Result Value Ref Range Status   Specimen Description BLOOD LEFT ANTECUBITAL  Final   Special Requests   Final    BOTTLES DRAWN AEROBIC AND ANAEROBIC Blood Culture adequate volume   Culture  Setup Time   Final    GRAM POSITIVE RODS AEROBIC BOTTLE ONLY PHARMD C. AMEND [id number] FCP Performed at [facility], [place] [address] [place],  [place] [address]    Culture GRAM POSITIVE RODS  Final   Report Status PENDING  Incomplete  Blood Culture (routine x 2)     Status: None (Preliminary result)   Collection Time: 10/11/19  2:00 AM   Specimen: BLOOD  Result Value Ref Range Status   Specimen Description BLOOD RIGHT ANTECUBITAL  Final   Special Requests   Final    BOTTLES DRAWN AEROBIC AND ANAEROBIC Blood Culture adequate volume   Culture   Final    NO GROWTH 4 DAYS Performed at [facility], [place] [address], [place], [place] [address]    Report Status PENDING  Incomplete  Culture, blood  (routine x 2)     Status: None (Preliminary result)   Collection Time: 10/11/19  4:10 PM   Specimen: BLOOD LEFT HAND  Result Value Ref Range Status   Specimen Description BLOOD LEFT HAND  Final   Special Requests   Final    BOTTLES DRAWN AEROBIC ONLY Blood Culture results may not be optimal due to an inadequate volume of blood received in culture bottles   Culture   Final    NO GROWTH 4 DAYS Performed at [facility], [place] [address], [place], [place] [address]    Report Status PENDING  Incomplete  Culture, blood (routine x 2)     Status: None (Preliminary result)   Collection Time: 10/11/19  4:11 PM   Specimen: BLOOD LEFT HAND  Result Value Ref Range Status   Specimen Description BLOOD LEFT HAND  Final   Special Requests   Final    BOTTLES DRAWN AEROBIC ONLY Blood Culture results may not be optimal due to an inadequate volume of blood received in culture bottles   Culture   Final    NO GROWTH 4 DAYS Performed at [facility], [place] [address] [place],  [address]    Report Status PENDING  Incomplete  SARS CORONAVIRUS 2 (TAT 6-24 HRS) Nasopharyngeal Nasopharyngeal Swab     Status: Abnormal   Collection Time: 10/13/19 11:50 AM   Specimen: Nasopharyngeal Swab  Result Value Ref Range Status   SARS Coronavirus 2 POSITIVE (A) NEGATIVE Final    Comment: RESULT CALLED TO, READ BACK BY AND VERIFIED WITH: N.GOMEZ RN 2297 10/13/19 MCCORMICK K (NOTE) SARS-CoV-2 target nucleic acids are DETECTED. The SARS-CoV-2 RNA is generally detectable in upper and lower respiratory specimens during the acute phase of infection. Positive results are indicative of the presence of SARS-CoV-2 RNA. Clinical correlation with patient history and other diagnostic information is  necessary to determine patient infection status. Positive results do not rule out bacterial infection or co-infection with other viruses.  The expected result is Negative. Fact Sheet for  Patients: SugarRoll.be Fact Sheet for Healthcare Providers: https://www.woods-mathews.com/ This test is not yet approved or cleared by the [place] FDA and  has been authorized for  detection and/or diagnosis of SARS-CoV-2 by FDA under an Emergency Use Authorization (EUA). This EUA will remain  in effect (meaning this test can be used) for th e duration of the COVID-19 declaration under Section 564(b)(1) of the Act, 21 U.S.C. section 360bbb-3(b)(1), unless the authorization is terminated or revoked sooner. Performed at [facility], [place] [address] [place], [place] [address]     Radiology Reports DG Chest 2 View  Result Date: 10/12/2019 CLINICAL DATA:  Fever, cough. EXAM: CHEST - 2 VIEW COMPARISON:  October 10, 2019. FINDINGS: Stable cardiomediastinal silhouette. No pneumothorax or pleural effusion is noted. Right lung is clear. Mild left midlung and basilar opacity is noted concerning for possible pneumonia or atelectasis which appears to be stable. Bony thorax is unremarkable. IMPRESSION: Stable mild left midlung and basilar opacity is noted concerning for pneumonia or atelectasis. Electronically Signed   By: Marijo Conception M.D.   On: 10/12/2019 13:47   DG Chest 2 View  Result Date: 10/10/2019 CLINICAL DATA:  Shortness of breath. EXAM: CHEST - 2 VIEW COMPARISON:  None. FINDINGS: Mild, chronic appearing increased interstitial lung markings are seen. Very mild atelectasis are and/or early infiltrate is seen within the mid left lung. There is no evidence of a pleural effusion or pneumothorax. The heart size and mediastinal contours are within normal limits. There is mild calcification of the aortic arch. Degenerative changes seen throughout the thoracic spine. IMPRESSION: Chronic appearing increased interstitial lung markings with very mild atelectasis and/or early infiltrate within the mid left lung. Electronically Signed   By: Virgina Norfolk  M.D.   On: 10/10/2019 18:05   NM Pulmonary Perf and Vent  Result Date: 10/11/2019 CLINICAL DATA:  84 year old presenting with a 4 day history of shortness of breath, cough and generalized weakness. Current history of stage 3 chronic kidney disease which precludes IV contrast administration. EXAM: NUCLEAR MEDICINE VENTILATION - PERFUSION LUNG SCAN TECHNIQUE: Ventilation images were obtained in multiple projections using inhaled aerosol Tc-85m DTPA. Perfusion images were obtained in multiple projections after intravenous injection of Tc-11m MAA. RADIOPHARMACEUTICALS:  Thirty-eight mCi of Tc-63m DTPA aerosol inhalation and 1.4 mCi Tc30m MAA IV. COMPARISON:  No prior nuclear imaging. Chest x-ray yesterday is correlated. FINDINGS: Ventilation: No focal ventilatory abnormality. Overall slight diminished ventilation in the LEFT lung relative to the RIGHT lung. Perfusion: No wedge shaped peripheral perfusion defects to suggest acute pulmonary embolism. Overall slight diminished perfusion to the LEFT lung relative to the RIGHT lung, matching the above ventilation. IMPRESSION: Low probability of pulmonary embolism. Electronically Signed   By: Evangeline Dakin M.D.   On: 10/11/2019 14:09

## 2019-10-15 NOTE — Plan of Care (Signed)
  Problem: Clinical Measurements: Goal: Respiratory complications will improve Outcome: Progressing Goal: Cardiovascular complication will be avoided Outcome: Progressing   

## 2019-10-15 NOTE — Progress Notes (Signed)
PHARMACY - PHYSICIAN COMMUNICATION CRITICAL VALUE ALERT - BLOOD CULTURE IDENTIFICATION (BCID)  Jeanne [name] is an 84 y.o. female who presented to [facility] on 10/10/2019 with a chief complaint of COVID 19  Assessment:  Lab called and pt with 1/6 blood culture bottles with GPR. BCID will not be run unless others result positive. Likely contaminant.  Name of physician (or Provider) Contacted: Dr. Sloan Leiter  Current antibiotics: Rocephin and Azithromycin for CAP  Changes to prescribed antibiotics recommended:  Patient is on recommended antibiotics - No changes needed  No results found for this or any previous visit.  Sherlon Handing, PharmD, BCPS Please see amion for complete clinical pharmacist phone list 10/15/2019  7:12 AM

## 2019-10-15 NOTE — Plan of Care (Signed)

## 2019-10-16 LAB — CULTURE, BLOOD (ROUTINE X 2)
Culture: NO GROWTH
Culture: NO GROWTH
Culture: NO GROWTH
Special Requests: ADEQUATE

## 2019-10-16 LAB — C-REACTIVE PROTEIN: CRP: 13.2 mg/dL — ABNORMAL HIGH (ref <1.0)

## 2019-10-16 LAB — COMPREHENSIVE METABOLIC PANEL
ALT: 27 U/L (ref 0–44)
AST: 46 U/L — ABNORMAL HIGH (ref 15–41)
Albumin: 2.3 g/dL — ABNORMAL LOW (ref 3.5–5.0)
Alkaline Phosphatase: 31 U/L — ABNORMAL LOW (ref 38–126)
Anion gap: 18 — ABNORMAL HIGH (ref 5–15)
BUN: 55 mg/dL — ABNORMAL HIGH (ref 8–23)
CO2: 27 mmol/L (ref 22–32)
Calcium: 9.2 mg/dL (ref 8.9–10.3)
Chloride: 91 mmol/L — ABNORMAL LOW (ref 98–111)
Creatinine, Ser: 1.86 mg/dL — ABNORMAL HIGH (ref 0.44–1.00)
GFR calc Af Amer: 29 mL/min — ABNORMAL LOW (ref >60)
GFR calc non Af Amer: 25 mL/min — ABNORMAL LOW (ref >60)
Glucose, Bld: 264 mg/dL — ABNORMAL HIGH (ref 70–99)
Potassium: 3.3 mmol/L — ABNORMAL LOW (ref 3.5–5.1)
Sodium: 136 mmol/L (ref 135–145)
Total Bilirubin: 0.7 mg/dL (ref 0.3–1.2)
Total Protein: 6.6 g/dL (ref 6.5–8.1)

## 2019-10-16 LAB — GLUCOSE, CAPILLARY
Glucose-Capillary: 261 mg/dL — ABNORMAL HIGH (ref 70–99)
Glucose-Capillary: 250 mg/dL — ABNORMAL HIGH (ref 70–99)
Glucose-Capillary: 298 mg/dL — ABNORMAL HIGH (ref 70–99)
Glucose-Capillary: 329 mg/dL — ABNORMAL HIGH (ref 70–99)
Glucose-Capillary: 280 mg/dL — ABNORMAL HIGH (ref 70–99)

## 2019-10-16 LAB — FERRITIN: Ferritin: 656 ng/mL — ABNORMAL HIGH (ref 11–307)

## 2019-10-16 LAB — D-DIMER, QUANTITATIVE: D-Dimer, Quant: 2.66 ug/mL-FEU — ABNORMAL HIGH (ref 0.00–0.50)

## 2019-10-16 MED ORDER — POTASSIUM CHLORIDE CRYS ER 20 MEQ PO TBCR
30.0000 meq | EXTENDED_RELEASE_TABLET | Freq: Once | ORAL | Status: AC
  Administered 2019-10-16: 30 meq via ORAL
  Filled 2019-10-16: qty 1

## 2019-10-16 NOTE — Progress Notes (Signed)
[name], MD Tylene Fantasia notified.

## 2019-10-16 NOTE — Progress Notes (Signed)
Physical Therapy Treatment Patient Details Name: Jeanne [name] MRN: [id number] DOB: 05-17-1936 Today's Date: 10/16/2019    History of Present Illness Patient is a 84 y.o. female with PMHx of morbid obesity, DM-2, CKD stage IV, OSA/OHS on CPAP, chronic diastolic heart failure, HTN, HLD who presented with shortness of breath and fever-found to have COVID-19 pneumonia and concurrent bacterial pneumonia causing acute hypoxemic respiratory failure.     PT Comments    Pt in bed on entry eager to get out of bed. Pt is making good progress towards her goals however continues to be limited in safe mobility by DoE and generalized weakness. Pt is supervision for bed mobility, and min Ax2 for transfers and short distance ambulation with Rollator. D/c plans remain appropriate at this time. PT will continue to follow acutely.   Follow Up Recommendations  SNF     Equipment Recommendations  None recommended by PT       Precautions / Restrictions Precautions Precautions: Fall;Other (comment)(airborne) Restrictions Weight Bearing Restrictions: No    Mobility  Bed Mobility Overal bed mobility: Needs Assistance Bed Mobility: Supine to Sit     Supine to sit: Supervision     General bed mobility comments: supervision for safety, HoB elevated and increased use of bedrail   Transfers Overall transfer level: Needs assistance Equipment used: 4-wheeled walker Transfers: Sit to/from Stand Sit to Stand: Min assist;+2 safety/equipment         General transfer comment: minAx2 for steadying in standing, increased vc for safe hand placement   Ambulation/Gait Ambulation/Gait assistance: Min assist;+2 physical assistance;+2 safety/equipment Gait Distance (Feet): 50 Feet(x3) Assistive device: 4-wheeled walker Gait Pattern/deviations: Step-through pattern;Decreased step length - right;Decreased step length - left;Wide base of support Gait velocity: slowed Gait velocity interpretation: <1.8  ft/sec, indicate of risk for recurrent falls General Gait Details: minA for steadying with slow mildly unsteady gait, requires sitting rest breaks secondary to fatigue       Balance   Sitting-balance support: Feet supported;No upper extremity supported                                        Cognition Arousal/Alertness: Awake/alert Behavior During Therapy: WFL for tasks assessed/performed Overall Cognitive Status: Within Functional Limits for tasks assessed                                 General Comments: Improved alertness and attention to task         General Comments General comments (skin integrity, edema, etc.): ambulated on RA, dropped to 85%O2 with poor pleth form, and quickly rebounded to 92%O2 with seated rest break      Pertinent Vitals/Pain Pain Assessment: No/denies pain           PT Goals (current goals can now be found in the care plan section) Acute Rehab PT Goals Patient Stated Goal: to go home PT Goal Formulation: With patient Time For Goal Achievement: 10/27/19 Potential to Achieve Goals: Good Progress towards PT goals: Progressing toward goals    Frequency    Min 2X/week      PT Plan Current plan remains appropriate       AM-PAC PT "6 Clicks" Mobility   Outcome Measure  Help needed turning from your back to your side while in a flat bed without using bedrails?: A Little Help  needed moving from lying on your back to sitting on the side of a flat bed without using bedrails?: A Little Help needed moving to and from a bed to a chair (including a wheelchair)?: A Little Help needed standing up from a chair using your arms (e.g., wheelchair or bedside chair)?: A Little Help needed to walk in hospital room?: A Little Help needed climbing 3-5 steps with a railing? : Total 6 Click Score: 16    End of Session   Activity Tolerance: Patient tolerated treatment well Patient left: in chair;with call bell/phone within  reach;with chair alarm set Nurse Communication: Mobility status PT Visit Diagnosis: Muscle weakness (generalized) (M62.81);Difficulty in walking, not elsewhere classified (R26.2);Pain Pain - Right/Left: (both) Pain - part of body: Knee     Time: 2158-7276 PT Time Calculation (min) (ACUTE ONLY): 33 min  Charges:  $Gait Training: 23-37 mins                     Claron Rosencrans B. Migdalia Dk PT, DPT Acute Rehabilitation Services Pager [phone] Office [phone]    [place] 10/16/2019, 4:48 PM

## 2019-10-16 NOTE — Progress Notes (Signed)
Occupational Therapy Treatment Patient Details Name: Jeanne [name] MRN: [id number] DOB: 08-17-1936 Today's Date: 10/16/2019    History of present illness Patient is a 84 y.o. female with PMHx of morbid obesity, DM-2, CKD stage IV, OSA/OHS on CPAP, chronic diastolic heart failure, HTN, HLD who presented with shortness of breath and fever-found to have COVID-19 pneumonia and concurrent bacterial pneumonia causing acute hypoxemic respiratory failure.    OT comments  PTA, pt lived at home with daughters and was able to complete ADLs/mobility Modified Independent using cane. Pt progressing towards OT goals with pt now on RA. Pt received in recliner chair and reports fatigue from earlier PT session, declining standing for grooming tasks. Pt setup for oral care and brushing hair seated in recliner, unsupported and displaying improve core stability for > 6 minutes during task. Pt on RA with O2 dropping to 85% during task, but may be inaccurate because pt quickly return to 90-95%. Guided pt in active upper body exercises (see below for further details), with pt fatiguing quickly with this task and O2 remaining >90%. DC plan remains appropriate. Will continue to follow acutely.   Follow Up Recommendations  SNF;Supervision/Assistance - 24 hour    Equipment Recommendations  3 in 1 bedside commode    Recommendations for Other Services      Precautions / Restrictions Precautions Precautions: Fall;Other (comment)(airborne) Restrictions Weight Bearing Restrictions: No       Mobility Bed Mobility Overal bed mobility: Needs Assistance             General bed mobility comments: Pt received in chair  Transfers Overall transfer level: Needs assistance               General transfer comment: Pt received in chair, did not assess mobility    Balance   Sitting-balance support: Feet supported;No upper extremity supported                                       ADL either  performed or assessed with clinical judgement   ADL   Eating/Feeding: Set up;Sitting   Grooming: Set up;Wash/dry face;Oral care;Brushing hair;Sitting                                 General ADL Comments: Pt received in chair, did not assess mobility today     Vision       Perception     Praxis      Cognition Arousal/Alertness: Awake/alert Behavior During Therapy: WFL for tasks assessed/performed Overall Cognitive Status: Within Functional Limits for tasks assessed                                 General Comments: Improved alertness and attention to task        Exercises Exercises: Other exercises Other Exercises Other Exercises: Active shoulder flexion overhead and "punches" out in front(Pt fatigued with this task, but O2 levels remained in 90s RA)   Shoulder Instructions       General Comments      Pertinent Vitals/ Pain       Pain Assessment: No/denies pain  Home Living  Prior Functioning/Environment              Frequency  Min 2X/week        Progress Toward Goals  OT Goals(current goals can now be found in the care plan section)  Progress towards OT goals: Progressing toward goals  Acute Rehab OT Goals Patient Stated Goal: to go home OT Goal Formulation: With patient Time For Goal Achievement: 10/28/19 Potential to Achieve Goals: Good ADL Goals Pt Will Perform Lower Body Bathing: with min assist;sit to/from stand Pt Will Transfer to Toilet: bedside commode;with set-up Pt Will Perform Toileting - Clothing Manipulation and hygiene: with supervision;sit to/from stand Pt/caregiver will Perform Home Exercise Program: Increased strength;With theraband;With written HEP provided;Independently  Plan Discharge plan remains appropriate    Co-evaluation                 AM-PAC OT "6 Clicks" Daily Activity     Outcome Measure   Help from another person  eating meals?: None Help from another person taking care of personal grooming?: A Little Help from another person toileting, which includes using toliet, bedpan, or urinal?: A Lot Help from another person bathing (including washing, rinsing, drying)?: A Lot Help from another person to put on and taking off regular upper body clothing?: A Little Help from another person to put on and taking off regular lower body clothing?: A Lot 6 Click Score: 16    End of Session Equipment Utilized During Treatment: Other (comment)(None, received in chair)  OT Visit Diagnosis: Unsteadiness on feet (R26.81);Muscle weakness (generalized) (M62.81)   Activity Tolerance Patient tolerated treatment well;Patient limited by fatigue   Patient Left in chair;with call bell/phone within reach;with chair alarm set   Nurse Communication          Time: [phone] OT Time Calculation (min): 18 min  Charges: OT General Charges $OT Visit: 1 Visit OT Treatments $Self Care/Home Management : 8-22 mins  Layla Maw, OTR/L   Layla Maw 10/16/2019, 1:50 PM

## 2019-10-16 NOTE — Plan of Care (Signed)
  Problem: Education: Goal: Knowledge of General Education information will improve Description: Including pain rating scale, medication(s)/side effects and non-pharmacologic comfort measures Outcome: Progressing   Problem: Health Behavior/Discharge Planning: Goal: Ability to manage health-related needs will improve Outcome: Progressing   Problem: Clinical Measurements: Goal: Will remain free from infection Outcome: Progressing   

## 2019-10-16 NOTE — Progress Notes (Signed)
PROGRESS NOTE                                                                                                                                                                                                             Patient Demographics:    Jeanne [name], is a 84 y.o. female, DOB - 1936-03-29, DPO:[id number]  Outpatient Primary MD for the patient is Seward Carol, MD   Admit date - 10/10/2019   LOS - 5  Chief Complaint  Patient presents with  . Weakness       Brief Narrative: Patient is a 84 y.o. female with PMHx of morbid obesity, DM-2, CKD stage IV, OSA/OHS on CPAP, chronic diastolic heart failure, HTN, HLD who presented with shortness of breath and fever-found to have COVID-19 pneumonia and concurrent bacterial pneumonia causing acute hypoxemic respiratory failure.  See below for further details.   Subjective:    Lying comfortably in bed-complains of cough but not short of breath.  She was placed on 2 L of oxygen overnight-has history of OSA.   Assessment  & Plan :   Acute Hypoxic Resp Failure due to Covid 19 Viral pneumonia and concurrent bacterial pneumonia: Improved-mostly on room air during daytime-at night she gets placed on O2 due to history of OSA.  CRP continues to downtrend.  Continue steroids/remdesivir.  Blood cultures on 2/6 neg so far.   Fever: afebrile  O2 requirements:  SpO2: 95 % O2 Flow Rate (L/min): 2 L/min   COVID-19 Labs: Recent Labs    10/14/19 0508 10/15/19 0457 10/16/19 0433  DDIMER 3.27* 2.99* 2.66*  FERRITIN 464* 639* 656*  CRP 22.4* 19.5* 13.2*       Component Value Date/Time   BNP 178.7 (H) 10/11/2019 0058    Recent Labs  Lab 10/11/19 0131 10/13/19 0517  PROCALCITON 0.26 0.50    Lab Results  Component Value Date   SARSCOV2NAA POSITIVE (A) 10/13/2019   [place] NEGATIVE 10/11/2019   SARSCOV2NAA NOT DETECTED 10/10/2019   [place] Not Detected 07/25/2019     COVID-19 Medications: Steroids:2/8>> Remdesivir:2/8>>  Antibiotics:  Ceftriaxone: 2/5>> 2/9 Zithromax: 2/5>> 2/9  Prone/Incentive Spirometry: encouraged  incentive spirometry use 3-4/hour.  DVT Prophylaxis  :  Lovenox   Chronic diastolic heart failure: Euvolemic-continue Lasix.  Hypokalemia: Replete and recheck.  CKD stage IV: Creatinine not far from usual baseline-avoid nephrotoxic  agents-follow.  DM-2: CBG stable-continue 65 units daily and SSI.  Recent Labs    10/15/19 2043 10/16/19 0800 10/16/19 1159  GLUCAP 229* 261* 250*   HLD: Continue Zetia and statin  HTN: Controlled-continue hydralazine, diltiazem  OSA: CPAP ordered if possible otherwise will need to be placed on O2 at night.   Obesity: Estimated body mass index is 57.64 kg/m as calculated from the following:   Height as of this encounter: 5\' 3"  (1.6 m).   Weight as of this encounter: 147.6 kg.   Consults  :  None  Procedures  :  None ABG:    Component Value Date/Time   TCO2 32 02/02/2013 1025    Vent Settings: N/A  Condition - Guarded  Family Communication  : Spoke with daughter Sarajean Dessert on 2/11  Code Status :  Full Code  Diet :  Diet Order            Diet heart healthy/carb modified Room service appropriate? Yes; Fluid consistency: Thin; Fluid restriction: 1200 mL Fluid  Diet effective now               Disposition Plan  :  Remain hospitalized- SNF on discharge-likely on 2/12  Barriers to discharge: Complete 5 days of IV Remdesivir  Antimicorbials  :    Anti-infectives (From admission, onward)   Start     Dose/Rate Route Frequency Ordered Stop   10/14/19 1000  remdesivir 100 mg in sodium chloride 0.9 % 100 mL IVPB     100 mg 200 mL/hr over 30 Minutes Intravenous Daily 10/13/19 1751 10/18/19 0959   10/13/19 1800  remdesivir 200 mg in sodium chloride 0.9% 250 mL IVPB     200 mg 580 mL/hr over 30 Minutes Intravenous Once 10/13/19 1751 10/13/19 1939   10/11/19 0800   azithromycin (ZITHROMAX) 500 mg in sodium chloride 0.9 % 250 mL IVPB     500 mg 250 mL/hr over 60 Minutes Intravenous Every 24 hours 10/11/19 0545 10/15/19 1900   10/11/19 0600  cefTRIAXone (ROCEPHIN) 2 g in sodium chloride 0.9 % 100 mL IVPB     2 g 200 mL/hr over 30 Minutes Intravenous Every 24 hours 10/11/19 0545 10/15/19 1900      Inpatient Medications  Scheduled Meds: . aspirin EC  81 mg Oral Daily  . dexamethasone (DECADRON) injection  10 mg Intravenous Q24H  . diltiazem  240 mg Oral Daily  . enoxaparin (LOVENOX) injection  0.5 mg/kg Subcutaneous Q24H  . ezetimibe  10 mg Oral QPM  . furosemide  20 mg Oral Daily  . hydrALAZINE  25 mg Oral TID  . insulin aspart  0-9 Units Subcutaneous TID WC  . insulin glargine  65 Units Subcutaneous Daily  . latanoprost  1 drop Left Eye QHS  . oxybutynin  10 mg Oral Daily  . pantoprazole  40 mg Oral Daily  . pravastatin  40 mg Oral q1800  . sodium chloride flush  3 mL Intravenous Once   Continuous Infusions: . remdesivir 100 mg in NS 100 mL 100 mg (10/16/19 0917)   PRN Meds:.acetaminophen **OR** acetaminophen, nitroGLYCERIN, ondansetron **OR** ondansetron (ZOFRAN) IV   Time Spent in minutes  25  See all Orders from today for further details   Oren Binet M.D on 10/16/2019 at 1:47 PM  To page go to [email or web address] - use universal password  [facility] -  Office  [phone]    Objective:   Vitals:   10/16/19 0730 10/16/19 0800 10/16/19 0917 10/16/19  1337  BP:   (!) 138/56 126/80  Pulse:  69  77  Resp:  20  19  Temp:    97.9 F (36.6 C)  TempSrc:    Oral  SpO2: 92% 95%    Weight:      Height:        Wt Readings from Last 3 Encounters:  10/16/19 (!) 147.6 kg  07/22/19 (!) 147.9 kg  07/16/19 (!) 146.6 kg     Intake/Output Summary (Last 24 hours) at 10/16/2019 1347 Last data filed at 10/16/2019 0700 Gross per 24 hour  Intake 870 ml  Output 400 ml  Net 470 ml     Physical Exam Gen Exam:Alert awake-not  in any distress HEENT:atraumatic, normocephalic Chest: B/L clear to auscultation anteriorly CVS:S1S2 regular Abdomen:soft non tender, non distended Extremities:no edema Neurology: Non focal Skin: no rash   Data Review:    CBC Recent Labs  Lab 10/10/19 1859 10/11/19 0738 10/12/19 0331 10/13/19 0517 10/14/19 0508  WBC 4.3 4.1 4.7 4.8 5.3  HGB 11.9* 12.6 11.7* 12.4 11.2*  HCT 37.7 39.3 36.0 39.0 36.1  PLT 152 106* 128* 106* 139*  MCV 87.5 85.6 84.1 85.9 87.0  MCH 27.6 27.5 27.3 27.3 27.0  MCHC 31.6 32.1 32.5 31.8 31.0  RDW 13.7 13.9 13.8 13.9 14.2  LYMPHSABS  --  1.2  --   --   --   MONOABS  --  0.5  --   --   --   EOSABS  --  0.0  --   --   --   BASOSABS  --  0.0  --   --   --     Chemistries  Recent Labs  Lab 10/11/19 0738 10/11/19 0738 10/12/19 0331 10/13/19 0517 10/14/19 0508 10/15/19 0457 10/16/19 0433  NA 136   < > 133* 134* 135 134* 136  K 3.4*   < > 3.1* 3.5 3.5 3.5 3.3*  CL 93*   < > 91* 95* 93* 95* 91*  CO2 27   < > 27 25 23 26 27   GLUCOSE 162*   < > 160* 113* 144* 232* 264*  BUN 40*   < > 37* 38* 40* 46* 55*  CREATININE 2.19*   < > 2.04* 1.84* 2.00* 1.91* 1.86*  CALCIUM 8.8*   < > 8.4* 8.6* 8.3* 8.8* 9.2  AST 29  --  37  --  48* 52* 46*  ALT 16  --  17  --  23 26 27   ALKPHOS 43  --  40  --  34* 32* 31*  BILITOT 0.4  --  0.5  --  0.5 0.6 0.7   < > = values in this interval not displayed.   ------------------------------------------------------------------------------------------------------------------ No results for input(s): CHOL, HDL, LDLCALC, TRIG, CHOLHDL, LDLDIRECT in the last 72 hours.  Lab Results  Component Value Date   HGBA1C 6.6 (H) 10/09/2015   ------------------------------------------------------------------------------------------------------------------ No results for input(s): TSH, T4TOTAL, T3FREE, THYROIDAB in the last 72 hours.  Invalid input(s):  FREET3 ------------------------------------------------------------------------------------------------------------------ Recent Labs    10/15/19 0457 10/16/19 0433  FERRITIN 639* 656*    Coagulation profile No results for input(s): INR, PROTIME in the last 168 hours.  Recent Labs    10/15/19 0457 10/16/19 0433  DDIMER 2.99* 2.66*    Cardiac Enzymes No results for input(s): CKMB, TROPONINI, MYOGLOBIN in the last 168 hours.  Invalid input(s): CK ------------------------------------------------------------------------------------------------------------------    Component Value Date/Time   BNP 178.7 (H) 10/11/2019 1025  Micro Results Recent Results (from the past 240 hour(s))  Novel Coronavirus, NAA (Hosp order, Send-out to Ref Lab; TAT 18-24 hrs     Status: None   Collection Time: 10/10/19  5:20 PM   Specimen: Nasopharyngeal Swab; Respiratory  Result Value Ref Range Status   SARS-CoV-2, NAA NOT DETECTED NOT DETECTED Final    Comment: (NOTE) This nucleic acid amplification test was developed and its performance characteristics determined by [employer]. Nucleic acid amplification tests include RT-PCR and TMA. This test has not been FDA cleared or approved. This test has been authorized by FDA under an Emergency Use Authorization (EUA). This test is only authorized for the duration of time the declaration that circumstances exist justifying the authorization of the emergency use of in vitro diagnostic tests for detection of SARS-CoV-2 virus and/or diagnosis of COVID-19 infection under section 564(b)(1) of the Act, 21 U.S.C. 329JJO-8(C) (1), unless the authorization is terminated or revoked sooner. When diagnostic testing is negative, the possibility of a false negative result should be considered in the context of a patient's recent exposures and the presence of clinical signs and symptoms consistent with COVID-19. An individual without symptoms of  COVID- 19 and who is not shedding SARS-CoV-2  virus would expect to have a negative (not detected) result in this assay. Performed At: [facility] [address] [place], [place] [id number] Rush Farmer MD WF:[id number]    [place]  Final    Comment: Performed at [facility], [place] [address], [place], [place] [address]  Respiratory Panel by RT PCR (Flu A&B, Covid) - Nasopharyngeal Swab     Status: None   Collection Time: 10/11/19  1:34 AM   Specimen: Nasopharyngeal Swab  Result Value Ref Range Status   SARS Coronavirus 2 by RT PCR NEGATIVE NEGATIVE Final    Comment: (NOTE) SARS-CoV-2 target nucleic acids are NOT DETECTED. The SARS-CoV-2 RNA is generally detectable in upper respiratoy specimens during the acute phase of infection. The lowest concentration of SARS-CoV-2 viral copies this assay can detect is 131 copies/mL. A negative result does not preclude SARS-Cov-2 infection and should not be used as the sole basis for treatment or other patient management decisions. A negative result may occur with  improper specimen collection/handling, submission of specimen other than nasopharyngeal swab, presence of viral mutation(s) within the areas targeted by this assay, and inadequate number of viral copies (<131 copies/mL). A negative result must be combined with clinical observations, patient history, and epidemiological information. The expected result is Negative. Fact Sheet for Patients:  PinkCheek.be Fact Sheet for Healthcare Providers:  GravelBags.it This test is not yet ap proved or cleared by the [place] FDA and  has been authorized for detection and/or diagnosis of SARS-CoV-2 by FDA under an Emergency Use Authorization (EUA). This EUA will remain  in effect (meaning this test can be used) for the duration of the COVID-19 declaration under Section 564(b)(1) of the Act, 21  U.S.C. section 360bbb-3(b)(1), unless the authorization is terminated or revoked sooner.    Influenza A by PCR NEGATIVE NEGATIVE Final   Influenza B by PCR NEGATIVE NEGATIVE Final    Comment: (NOTE) The Xpert Xpress SARS-CoV-2/FLU/RSV assay is intended as an aid in  the diagnosis of influenza from Nasopharyngeal swab specimens and  should not be used as a sole basis for treatment. Nasal washings and  aspirates are unacceptable for Xpert Xpress SARS-CoV-2/FLU/RSV  testing. Fact Sheet for Patients: PinkCheek.be Fact Sheet for Healthcare Providers: GravelBags.it This test  is not yet approved or cleared by the [place] and  has been authorized for detection and/or diagnosis of SARS-CoV-2 by  FDA under an Emergency Use Authorization (EUA). This EUA will remain  in effect (meaning this test can be used) for the duration of the  Covid-19 declaration under Section 564(b)(1) of the Act, 21  U.S.C. section 360bbb-3(b)(1), unless the authorization is  terminated or revoked. Performed at [facility], [place] [address] [place], [place] [address]   Blood Culture (routine x 2)     Status: None (Preliminary result)   Collection Time: 10/11/19  2:00 AM   Specimen: BLOOD  Result Value Ref Range Status   Specimen Description BLOOD LEFT ANTECUBITAL  Final   Special Requests   Final    BOTTLES DRAWN AEROBIC AND ANAEROBIC Blood Culture adequate volume   Culture  Setup Time   Final    GRAM POSITIVE RODS AEROBIC BOTTLE ONLY PHARMD C. AMEND [id number] FCP Performed at [facility], [place] [address] [place], [place] [address]    Culture GRAM POSITIVE RODS  Final   Report Status PENDING  Incomplete  Blood Culture (routine x 2)     Status: None   Collection Time: 10/11/19  2:00 AM   Specimen: BLOOD  Result Value Ref Range Status   Specimen Description BLOOD RIGHT ANTECUBITAL  Final   Special Requests   Final    BOTTLES  DRAWN AEROBIC AND ANAEROBIC Blood Culture adequate volume   Culture   Final    NO GROWTH 5 DAYS Performed at [facility], [place] [address] [place], [place] [address]    Report Status 10/16/2019 FINAL  Final  Culture, blood (routine x 2)     Status: None   Collection Time: 10/11/19  4:10 PM   Specimen: BLOOD LEFT HAND  Result Value Ref Range Status   Specimen Description BLOOD LEFT HAND  Final   Special Requests   Final    BOTTLES DRAWN AEROBIC ONLY Blood Culture results may not be optimal due to an inadequate volume of blood received in culture bottles   Culture   Final    NO GROWTH 5 DAYS Performed at [facility], [place] [address] [place], [place] [address]    Report Status 10/16/2019 FINAL  Final  Culture, blood (routine x 2)     Status: None   Collection Time: 10/11/19  4:11 PM   Specimen: BLOOD LEFT HAND  Result Value Ref Range Status   Specimen Description BLOOD LEFT HAND  Final   Special Requests   Final    BOTTLES DRAWN AEROBIC ONLY Blood Culture results may not be optimal due to an inadequate volume of blood received in culture bottles   Culture   Final    NO GROWTH 5 DAYS Performed at [facility], [place] [address] [place], [place] [address]    Report Status 10/16/2019 FINAL  Final  SARS CORONAVIRUS 2 (TAT 6-24 HRS) Nasopharyngeal Nasopharyngeal Swab     Status: Abnormal   Collection Time: 10/13/19 11:50 AM   Specimen: Nasopharyngeal Swab  Result Value Ref Range Status   SARS Coronavirus 2 POSITIVE (A) NEGATIVE Final    Comment: RESULT CALLED TO, READ BACK BY AND VERIFIED WITH: N.GOMEZ RN [id number] 10/13/19 MCCORMICK K (NOTE) SARS-CoV-2 target nucleic acids are DETECTED. The SARS-CoV-2 RNA is generally detectable in upper and lower respiratory specimens during the acute phase of infection. Positive results are indicative of  the presence of SARS-CoV-2 RNA. Clinical correlation with patient history and other diagnostic information is  necessary to  determine patient infection status. Positive results do not rule out bacterial infection or co-infection with other viruses.  The expected result is Negative. Fact Sheet for Patients: SugarRoll.be Fact Sheet for Healthcare Providers: https://www.woods-mathews.com/ This test is not yet approved or cleared by the [place] FDA and  has been authorized for detection and/or diagnosis of SARS-CoV-2 by FDA under an Emergency Use Authorization (EUA). This EUA will remain  in effect (meaning this test can be used) for th e duration of the COVID-19 declaration under Section 564(b)(1) of the Act, 21 U.S.C. section 360bbb-3(b)(1), unless the authorization is terminated or revoked sooner. Performed at [facility], [place] [address] [place], [place] [address]     Radiology Reports DG Chest 2 View  Result Date: 10/12/2019 CLINICAL DATA:  Fever, cough. EXAM: CHEST - 2 VIEW COMPARISON:  October 10, 2019. FINDINGS: Stable cardiomediastinal silhouette. No pneumothorax or pleural effusion is noted. Right lung is clear. Mild left midlung and basilar opacity is noted concerning for possible pneumonia or atelectasis which appears to be stable. Bony thorax is unremarkable. IMPRESSION: Stable mild left midlung and basilar opacity is noted concerning for pneumonia or atelectasis. Electronically Signed   By: Marijo Conception M.D.   On: 10/12/2019 13:47   DG Chest 2 View  Result Date: 10/10/2019 CLINICAL DATA:  Shortness of breath. EXAM: CHEST - 2 VIEW COMPARISON:  None. FINDINGS: Mild, chronic appearing increased interstitial lung markings are seen. Very mild atelectasis are and/or early infiltrate is seen within the mid left lung. There is no evidence of a pleural effusion or pneumothorax. The heart size and mediastinal contours are within normal limits. There is mild calcification of the aortic arch. Degenerative changes seen throughout the thoracic spine. IMPRESSION:  Chronic appearing increased interstitial lung markings with very mild atelectasis and/or early infiltrate within the mid left lung. Electronically Signed   By: Virgina Norfolk M.D.   On: 10/10/2019 18:05   NM Pulmonary Perf and Vent  Result Date: 10/11/2019 CLINICAL DATA:  84 year old presenting with a 4 day history of shortness of breath, cough and generalized weakness. Current history of stage 3 chronic kidney disease which precludes IV contrast administration. EXAM: NUCLEAR MEDICINE VENTILATION - PERFUSION LUNG SCAN TECHNIQUE: Ventilation images were obtained in multiple projections using inhaled aerosol Tc-71m DTPA. Perfusion images were obtained in multiple projections after intravenous injection of Tc-65m MAA. RADIOPHARMACEUTICALS:  Thirty-eight mCi of Tc-48m DTPA aerosol inhalation and 1.4 mCi Tc103m MAA IV. COMPARISON:  No prior nuclear imaging. Chest x-ray yesterday is correlated. FINDINGS: Ventilation: No focal ventilatory abnormality. Overall slight diminished ventilation in the LEFT lung relative to the RIGHT lung. Perfusion: No wedge shaped peripheral perfusion defects to suggest acute pulmonary embolism. Overall slight diminished perfusion to the LEFT lung relative to the RIGHT lung, matching the above ventilation. IMPRESSION: Low probability of pulmonary embolism. Electronically Signed   By: Evangeline Dakin M.D.   On: 10/11/2019 14:09

## 2019-10-17 DIAGNOSIS — I519 Heart disease, unspecified: Secondary | ICD-10-CM

## 2019-10-17 LAB — CULTURE, BLOOD (ROUTINE X 2): Special Requests: ADEQUATE

## 2019-10-17 LAB — COMPREHENSIVE METABOLIC PANEL
ALT: 31 U/L (ref 0–44)
AST: 47 U/L — ABNORMAL HIGH (ref 15–41)
Albumin: 2.4 g/dL — ABNORMAL LOW (ref 3.5–5.0)
Alkaline Phosphatase: 35 U/L — ABNORMAL LOW (ref 38–126)
Anion gap: 13 (ref 5–15)
BUN: 61 mg/dL — ABNORMAL HIGH (ref 8–23)
CO2: 26 mmol/L (ref 22–32)
Calcium: 9.1 mg/dL (ref 8.9–10.3)
Chloride: 96 mmol/L — ABNORMAL LOW (ref 98–111)
Creatinine, Ser: 1.97 mg/dL — ABNORMAL HIGH (ref 0.44–1.00)
GFR calc Af Amer: 27 mL/min — ABNORMAL LOW (ref >60)
GFR calc non Af Amer: 23 mL/min — ABNORMAL LOW (ref >60)
Glucose, Bld: 267 mg/dL — ABNORMAL HIGH (ref 70–99)
Potassium: 3.7 mmol/L (ref 3.5–5.1)
Sodium: 135 mmol/L (ref 135–145)
Total Bilirubin: 0.6 mg/dL (ref 0.3–1.2)
Total Protein: 6.9 g/dL (ref 6.5–8.1)

## 2019-10-17 LAB — GLUCOSE, CAPILLARY
Glucose-Capillary: 243 mg/dL — ABNORMAL HIGH (ref 70–99)
Glucose-Capillary: 283 mg/dL — ABNORMAL HIGH (ref 70–99)

## 2019-10-17 LAB — D-DIMER, QUANTITATIVE: D-Dimer, Quant: 1.34 ug/mL-FEU — ABNORMAL HIGH (ref 0.00–0.50)

## 2019-10-17 LAB — C-REACTIVE PROTEIN: CRP: 8.3 mg/dL — ABNORMAL HIGH (ref <1.0)

## 2019-10-17 LAB — FERRITIN: Ferritin: 443 ng/mL — ABNORMAL HIGH (ref 11–307)

## 2019-10-17 MED ORDER — DEXAMETHASONE 6 MG PO TABS: 6.0000 mg | ORAL_TABLET | Freq: Every day | ORAL | 0 refills | Status: AC

## 2019-10-17 MED ORDER — INSULIN ASPART 100 UNIT/ML ~~LOC~~ SOLN: SUBCUTANEOUS | 11 refills | Status: DC

## 2019-10-17 NOTE — Discharge Summary (Signed)
PATIENT DETAILS Name: Jeanne [name] Age: 84 y.o. Sex: female Date of Birth: 29-Mar-1936 MRN: [id number]. Admitting Physician: Rise Patience, MD [id number], Jori Moll, MD  Admit Date: 10/10/2019 Discharge date: 10/17/2019  Recommendations for Outpatient Follow-up:  1. Follow up with PCP in 1-2 weeks 2. Please obtain CMP/CBC in one week 3. Repeat Chest Xray in 4-6 week   Admitted From:  Home  Disposition: SNF   Home Health: No  Equipment/Devices:  None  Discharge Condition: Stable  CODE STATUS: DNR  Diet recommendation:  Diet Order            Diet - low sodium heart healthy        Diet Carb Modified        Diet heart healthy/carb modified Room service appropriate? Yes; Fluid consistency: Thin; Fluid restriction: 1200 mL Fluid  Diet effective now               Brief Summary: See H&P, Labs, Consult and Test reports for all details in brief, Patient is a 84 y.o. female with PMHx of morbid obesity, DM-2, CKD stage IV, OSA/OHS on CPAP, chronic diastolic heart failure, HTN, HLD who presented with shortness of breath and fever-found to have COVID-19 pneumonia and concurrent bacterial pneumonia causing acute hypoxemic respiratory failure.  See below for further details.  Brief Hospital Course: Acute Hypoxic Resp Failure due to Covid 19 Viral pneumonia and concurrent bacterial pneumonia:  Significant improvement-remains on room air.  Has completed a course of IV antibiotics.  Will complete remdesivir on 2/12-we will continue on tapering steroids for the next few days.  CRP continues to downtrend.  Blood cultures on 2/6 negative so far.     COVID-19 Labs:  Recent Labs    10/15/19 0457 10/16/19 0433 10/17/19 0331  DDIMER 2.99* 2.66* 1.34*  FERRITIN 639* 656* 443*  CRP 19.5* 13.2* 8.3*    Lab Results  Component Value Date   SARSCOV2NAA POSITIVE (A) 10/13/2019   [place] NEGATIVE 10/11/2019   SARSCOV2NAA NOT DETECTED 10/10/2019   [place] Not  Detected 07/25/2019    COVID-19 Medications: Steroids:2/8>> Remdesivir:2/8>>2/12  Antibiotics:  Ceftriaxone: 2/5>> 2/9 Zithromax: 2/5>> 2/9  Chronic diastolic heart failure: Euvolemic-continue Lasix.  Hypokalemia: Repleted  CKD stage IV: Creatinine not far from usual baseline-avoid nephrotoxic agents-follow.  DM-2: CBG stable-continue 65 units daily and SSI.  HLD: Continue Zetia and statin  HTN: Controlled-continue hydralazine, diltiazem  OSA: CPAPqhs  Obesity: Estimated body mass index is 57.64 kg/m as calculated from the following:   Height as of this encounter: 5\' 3"  (1.6 m).   Weight as of this encounter: 147.6 kg.   Procedures/Studies: None  Discharge Diagnoses:  Active Problems:   Chest pain   Diabetes mellitus type II, non insulin dependent (HCC)   Hypertension with heart disease   CKD (chronic kidney disease), stage III   Diastolic dysfunction, left ventricle   Acute respiratory failure with hypoxia (HCC)   CAP (community acquired pneumonia)   Discharge Instructions:    Person Under Monitoring Name: Jeanne [name]  Location: [address] [place] [address]   Infection Prevention Recommendations for Individuals Confirmed to have, or Being Evaluated for, 2019 Novel Coronavirus (COVID-19) Infection Who Receive Care at Home  Individuals who are confirmed to have, or are being evaluated for, COVID-19 should follow the prevention steps below until a healthcare provider or local or state health department says they can return to normal activities.  Stay home except to get medical care You should restrict  activities outside your home, except for getting medical care. Do not go to work, school, or public areas, and do not use public transportation or taxis.  Call ahead before visiting your doctor Before your medical appointment, call the healthcare provider and tell them that you have, or are being evaluated for, COVID-19 infection. This  will help the healthcare provider's office take steps to keep other people from getting infected. Ask your healthcare provider to call the local or state health department.  Monitor your symptoms Seek prompt medical attention if your illness is worsening (e.g., difficulty breathing). Before going to your medical appointment, call the healthcare provider and tell them that you have, or are being evaluated for, COVID-19 infection. Ask your healthcare provider to call the local or state health department.  Wear a facemask You should wear a facemask that covers your nose and mouth when you are in the same room with other people and when you visit a healthcare provider. People who live with or visit you should also wear a facemask while they are in the same room with you.  Separate yourself from other people in your home As much as possible, you should stay in a different room from other people in your home. Also, you should use a separate bathroom, if available.  Avoid sharing household items You should not share dishes, drinking glasses, cups, eating utensils, towels, bedding, or other items with other people in your home. After using these items, you should wash them thoroughly with soap and water.  Cover your coughs and sneezes Cover your mouth and nose with a tissue when you cough or sneeze, or you can cough or sneeze into your sleeve. Throw used tissues in a lined trash can, and immediately wash your hands with soap and water for at least 20 seconds or use an alcohol-based hand rub.  Wash your [facility] your hands often and thoroughly with soap and water for at least 20 seconds. You can use an alcohol-based hand sanitizer if soap and water are not available and if your hands are not visibly dirty. Avoid touching your eyes, nose, and mouth with unwashed hands.   Prevention Steps for Caregivers and Household Members of Individuals Confirmed to have, or Being Evaluated for, COVID-19  Infection Being Cared for in the Home  If you live with, or provide care at home for, a person confirmed to have, or being evaluated for, COVID-19 infection please follow these guidelines to prevent infection:  Follow healthcare provider's instructions Make sure that you understand and can help the patient follow any healthcare provider instructions for all care.  Provide for the patient's basic needs You should help the patient with basic needs in the home and provide support for getting groceries, prescriptions, and other personal needs.  Monitor the patient's symptoms If they are getting sicker, call his or her medical provider and tell them that the patient has, or is being evaluated for, COVID-19 infection. This will help the healthcare provider's office take steps to keep other people from getting infected. Ask the healthcare provider to call the local or state health department.  Limit the number of people who have contact with the patient  If possible, have only one caregiver for the patient.  Other household members should stay in another home or place of residence. If this is not possible, they should stay  in another room, or be separated from the patient as much as possible. Use a separate bathroom, if available.  Restrict visitors  who do not have an essential need to be in the home.  Keep older adults, very young children, and other sick people away from the patient Keep older adults, very young children, and those who have compromised immune systems or chronic health conditions away from the patient. This includes people with chronic heart, lung, or kidney conditions, diabetes, and cancer.  Ensure good ventilation Make sure that shared spaces in the home have good air flow, such as from an air conditioner or an opened window, weather permitting.  Wash your hands often  Wash your hands often and thoroughly with soap and water for at least 20 seconds. You can use an  alcohol based hand sanitizer if soap and water are not available and if your hands are not visibly dirty.  Avoid touching your eyes, nose, and mouth with unwashed hands.  Use disposable paper towels to dry your hands. If not available, use dedicated cloth towels and replace them when they become wet.  Wear a facemask and gloves  Wear a disposable facemask at all times in the room and gloves when you touch or have contact with the patient's blood, body fluids, and/or secretions or excretions, such as sweat, saliva, sputum, nasal mucus, vomit, urine, or feces.  Ensure the mask fits over your nose and mouth tightly, and do not touch it during use.  Throw out disposable facemasks and gloves after using them. Do not reuse.  Wash your hands immediately after removing your facemask and gloves.  If your personal clothing becomes contaminated, carefully remove clothing and launder. Wash your hands after handling contaminated clothing.  Place all used disposable facemasks, gloves, and other waste in a lined container before disposing them with other household waste.  Remove gloves and wash your hands immediately after handling these items.  Do not share dishes, glasses, or other household items with the patient  Avoid sharing household items. You should not share dishes, drinking glasses, cups, eating utensils, towels, bedding, or other items with a patient who is confirmed to have, or being evaluated for, COVID-19 infection.  After the person uses these items, you should wash them thoroughly with soap and water.  Wash laundry thoroughly  Immediately remove and wash clothes or bedding that have blood, body fluids, and/or secretions or excretions, such as sweat, saliva, sputum, nasal mucus, vomit, urine, or feces, on them.  Wear gloves when handling laundry from the patient.  Read and follow directions on labels of laundry or clothing items and detergent. In general, wash and dry with the  warmest temperatures recommended on the label.  Clean all areas the individual has used often  Clean all touchable surfaces, such as counters, tabletops, doorknobs, bathroom fixtures, toilets, phones, keyboards, tablets, and bedside tables, every day. Also, clean any surfaces that may have blood, body fluids, and/or secretions or excretions on them.  Wear gloves when cleaning surfaces the patient has come in contact with.  Use a diluted bleach solution (e.g., dilute bleach with 1 part bleach and 10 parts water) or a household disinfectant with a label that says EPA-registered for coronaviruses. To make a bleach solution at home, add 1 tablespoon of bleach to 1 quart (4 cups) of water. For a larger supply, add  cup of bleach to 1 gallon (16 cups) of water.  Read labels of cleaning products and follow recommendations provided on product labels. Labels contain instructions for safe and effective use of the cleaning product including precautions you should take when applying the product, such  as wearing gloves or eye protection and making sure you have good ventilation during use of the product.  Remove gloves and wash hands immediately after cleaning.  Monitor yourself for signs and symptoms of illness Caregivers and household members are considered close contacts, should monitor their health, and will be asked to limit movement outside of the home to the extent possible. Follow the monitoring steps for close contacts listed on the symptom monitoring form.   ? If you have additional questions, contact your local health department or call the epidemiologist on call at [phone] (available 24/7). ? This guidance is subject to change. For the most up-to-date guidance from CDC, please refer to their website: YouBlogs.pl    Activity:  As tolerated with Full fall precautions use walker/cane & assistance as needed   Discharge  Instructions    Call MD for:  difficulty breathing, headache or visual disturbances   Complete by: As directed    Call MD for:  persistant dizziness or light-headedness   Complete by: As directed    Call MD for:  persistant nausea and vomiting   Complete by: As directed    Diet - low sodium heart healthy   Complete by: As directed    Diet Carb Modified   Complete by: As directed    Increase activity slowly   Complete by: As directed      Allergies as of 10/17/2019      Reactions   Amoxicillin Nausea Only   Has patient had a PCN reaction causing immediate rash, facial/tongue/throat swelling, SOB or lightheadedness with hypotension: yes, i was dizzy Has patient had a PCN reaction causing severe rash involving mucus membranes or skin necrosis: no Did a PCN reaction that required hospitalization : no, called the DR. Did PCN reaction occurring within the last 10 years: unknown- pt cant recall exactly how long ago it was. if all of the above answers are "NO", then may proceed with Cephalosporin use.   Invokana [canagliflozin] Nausea Only, Other (See Comments)   Dizziness      Medication List    TAKE these medications   albuterol 108 (90 Base) MCG/ACT inhaler Commonly known as: VENTOLIN HFA Inhale 2 puffs into the lungs every 4 (four) hours as needed for wheezing or shortness of breath.   aspirin EC 81 MG tablet Take 81 mg by mouth daily.   BIOFREEZE EX Apply 1 application topically daily as needed (back pain).   dexamethasone 6 MG tablet Commonly known as: DECADRON Take 1 tablet (6 mg total) by mouth daily for 3 days.   diltiazem 240 MG 24 hr capsule Commonly known as: TIAZAC Take 1 capsule (240 mg total) by mouth daily.   ezetimibe 10 MG tablet Commonly known as: ZETIA Take 10 mg by mouth every evening.   furosemide 20 MG tablet Commonly known as: LASIX TAKE 1 TABLET BY MOUTH EVERY DAY   hydrALAZINE 25 MG tablet Commonly known as: APRESOLINE Take 1 tablet (25 mg  total) by mouth 3 (three) times daily.   insulin aspart 100 UNIT/ML injection Commonly known as: novoLOG 0-9 Units, Subcutaneous, 3 times daily with meals CBG < 70: Implement Hypoglycemia measures/Call MD CBG 70 - 120: 0 units CBG 121 - 150: 1 unit CBG 151 - 200: 2 units CBG 201 - 250: 3 units CBG 251 - 300: 5 units CBG 301 - 350: 7 units CBG 351 - 400: 9 units CBG > 400: call MD   insulin glargine 100 UNIT/ML injection Commonly known  as: LANTUS Inject 65 Units into the skin daily.   latanoprost 0.005 % ophthalmic solution Commonly known as: XALATAN Place 1 drop into the left eye at bedtime.   nitroGLYCERIN 0.4 MG SL tablet Commonly known as: NITROSTAT Place 1 tablet (0.4 mg total) under the tongue as needed for chest pain.   oxybutynin 10 MG 24 hr tablet Commonly known as: DITROPAN-XL Take 10 mg by mouth daily.   pantoprazole 40 MG tablet Commonly known as: PROTONIX Take 40 mg by mouth daily.   pravastatin 40 MG tablet Commonly known as: PRAVACHOL Take 40 mg by mouth daily.   REFRESH OP Place 1 drop into both eyes at bedtime.      Follow-up Information    Seward Carol, MD. Schedule an appointment as soon as possible for a visit in 1 week(s).   Specialty: Internal Medicine Contact information: [address] [place] [address] [phone]          Allergies  Allergen Reactions  . Amoxicillin Nausea Only    Has patient had a PCN reaction causing immediate rash, facial/tongue/throat swelling, SOB or lightheadedness with hypotension: yes, i was dizzy Has patient had a PCN reaction causing severe rash involving mucus membranes or skin necrosis: no Did a PCN reaction that required hospitalization : no, called the DR. Did PCN reaction occurring within the last 10 years: unknown- pt cant recall exactly how long ago it was. if all of the above answers are "NO", then may proceed with Cephalosporin use.   [name] [Canagliflozin] Nausea  Only and Other (See Comments)    Dizziness     Consultations:   None  Other Procedures/Studies: DG Chest 2 View  Result Date: 10/12/2019 CLINICAL DATA:  Fever, cough. EXAM: CHEST - 2 VIEW COMPARISON:  October 10, 2019. FINDINGS: Stable cardiomediastinal silhouette. No pneumothorax or pleural effusion is noted. Right lung is clear. Mild left midlung and basilar opacity is noted concerning for possible pneumonia or atelectasis which appears to be stable. Bony thorax is unremarkable. IMPRESSION: Stable mild left midlung and basilar opacity is noted concerning for pneumonia or atelectasis. Electronically Signed   By: Marijo Conception M.D.   On: 10/12/2019 13:47   DG Chest 2 View  Result Date: 10/10/2019 CLINICAL DATA:  Shortness of breath. EXAM: CHEST - 2 VIEW COMPARISON:  None. FINDINGS: Mild, chronic appearing increased interstitial lung markings are seen. Very mild atelectasis are and/or early infiltrate is seen within the mid left lung. There is no evidence of a pleural effusion or pneumothorax. The heart size and mediastinal contours are within normal limits. There is mild calcification of the aortic arch. Degenerative changes seen throughout the thoracic spine. IMPRESSION: Chronic appearing increased interstitial lung markings with very mild atelectasis and/or early infiltrate within the mid left lung. Electronically Signed   By: Virgina Norfolk M.D.   On: 10/10/2019 18:05   NM Pulmonary Perf and Vent  Result Date: 10/11/2019 CLINICAL DATA:  84 year old presenting with a 4 day history of shortness of breath, cough and generalized weakness. Current history of stage 3 chronic kidney disease which precludes IV contrast administration. EXAM: NUCLEAR MEDICINE VENTILATION - PERFUSION LUNG SCAN TECHNIQUE: Ventilation images were obtained in multiple projections using inhaled aerosol Tc-60m DTPA. Perfusion images were obtained in multiple projections after intravenous injection of Tc-40m MAA.  RADIOPHARMACEUTICALS:  Thirty-eight mCi of Tc-69m DTPA aerosol inhalation and 1.4 mCi Tc53m MAA IV. COMPARISON:  No prior nuclear imaging. Chest x-ray yesterday is correlated. FINDINGS: Ventilation:  No focal ventilatory abnormality. Overall slight diminished ventilation in the LEFT lung relative to the RIGHT lung. Perfusion: No wedge shaped peripheral perfusion defects to suggest acute pulmonary embolism. Overall slight diminished perfusion to the LEFT lung relative to the RIGHT lung, matching the above ventilation. IMPRESSION: Low probability of pulmonary embolism. Electronically Signed   By: Evangeline Dakin M.D.   On: 10/11/2019 14:09     TODAY-DAY OF DISCHARGE:  Subjective:   Jeanne [name] today has no headache,no chest abdominal pain,no new weakness tingling or numbness, feels much better wants to go home today.   Objective:   Blood pressure (!) 151/76, pulse 74, temperature 97.7 F (36.5 C), temperature source Oral, resp. rate 13, height 5\' 3"  (1.6 m), weight (!) 147.6 kg, SpO2 91 %.  Intake/Output Summary (Last 24 hours) at 10/17/2019 0936 Last data filed at 10/17/2019 0509 Gross per 24 hour  Intake 320 ml  Output 800 ml  Net -480 ml   Filed Weights   10/11/19 2230 10/14/19 0500 10/16/19 0600  Weight: (!) 146.1 kg (!) 147.2 kg (!) 147.6 kg    Exam: Awake Alert, Oriented *3, No new F.N deficits, Normal affect [place].AT,PERRAL Supple Neck,No JVD, No cervical lymphadenopathy appriciated.  Symmetrical Chest wall movement, Good air movement bilaterally, CTAB RRR,No Gallops,Rubs or new Murmurs, No Parasternal Heave +ve B.Sounds, Abd Soft, Non tender, No organomegaly appriciated, No rebound -guarding or rigidity. No Cyanosis, Clubbing or edema, No new Rash or bruise   PERTINENT RADIOLOGIC STUDIES: DG Chest 2 View  Result Date: 10/12/2019 CLINICAL DATA:  Fever, cough. EXAM: CHEST - 2 VIEW COMPARISON:  October 10, 2019. FINDINGS: Stable cardiomediastinal silhouette. No pneumothorax  or pleural effusion is noted. Right lung is clear. Mild left midlung and basilar opacity is noted concerning for possible pneumonia or atelectasis which appears to be stable. Bony thorax is unremarkable. IMPRESSION: Stable mild left midlung and basilar opacity is noted concerning for pneumonia or atelectasis. Electronically Signed   By: Marijo Conception M.D.   On: 10/12/2019 13:47   DG Chest 2 View  Result Date: 10/10/2019 CLINICAL DATA:  Shortness of breath. EXAM: CHEST - 2 VIEW COMPARISON:  None. FINDINGS: Mild, chronic appearing increased interstitial lung markings are seen. Very mild atelectasis are and/or early infiltrate is seen within the mid left lung. There is no evidence of a pleural effusion or pneumothorax. The heart size and mediastinal contours are within normal limits. There is mild calcification of the aortic arch. Degenerative changes seen throughout the thoracic spine. IMPRESSION: Chronic appearing increased interstitial lung markings with very mild atelectasis and/or early infiltrate within the mid left lung. Electronically Signed   By: Virgina Norfolk M.D.   On: 10/10/2019 18:05   NM Pulmonary Perf and Vent  Result Date: 10/11/2019 CLINICAL DATA:  84 year old presenting with a 4 day history of shortness of breath, cough and generalized weakness. Current history of stage 3 chronic kidney disease which precludes IV contrast administration. EXAM: NUCLEAR MEDICINE VENTILATION - PERFUSION LUNG SCAN TECHNIQUE: Ventilation images were obtained in multiple projections using inhaled aerosol Tc-44m DTPA. Perfusion images were obtained in multiple projections after intravenous injection of Tc-14m MAA. RADIOPHARMACEUTICALS:  Thirty-eight mCi of Tc-81m DTPA aerosol inhalation and 1.4 mCi Tc73m MAA IV. COMPARISON:  No prior nuclear imaging. Chest x-ray yesterday is correlated. FINDINGS: Ventilation: No focal ventilatory abnormality. Overall slight diminished ventilation in the LEFT lung relative to the  RIGHT lung. Perfusion: No wedge shaped peripheral perfusion defects to suggest acute pulmonary embolism. Overall slight  diminished perfusion to the LEFT lung relative to the RIGHT lung, matching the above ventilation. IMPRESSION: Low probability of pulmonary embolism. Electronically Signed   By: Evangeline Dakin M.D.   On: 10/11/2019 14:09     PERTINENT LAB RESULTS: CBC: No results for input(s): WBC, HGB, HCT, PLT in the last 72 hours. CMET CMP     Component Value Date/Time   NA 135 10/17/2019 0331   K 3.7 10/17/2019 0331   CL 96 (L) 10/17/2019 0331   CO2 26 10/17/2019 0331   GLUCOSE 267 (H) 10/17/2019 0331   BUN 61 (H) 10/17/2019 0331   CREATININE 1.97 (H) 10/17/2019 0331   CALCIUM 9.1 10/17/2019 0331   PROT 6.9 10/17/2019 0331   ALBUMIN 2.4 (L) 10/17/2019 0331   AST 47 (H) 10/17/2019 0331   ALT 31 10/17/2019 0331   ALKPHOS 35 (L) 10/17/2019 0331   BILITOT 0.6 10/17/2019 0331   GFRNONAA 23 (L) 10/17/2019 0331   GFRAA 27 (L) 10/17/2019 0331    GFR Estimated Creatinine Clearance: 32 mL/min (A) (by C-G formula based on SCr of 1.97 mg/dL (H)). No results for input(s): LIPASE, AMYLASE in the last 72 hours. No results for input(s): CKTOTAL, CKMB, CKMBINDEX, TROPONINI in the last 72 hours. Invalid input(s): POCBNP Recent Labs    10/16/19 0433 10/17/19 0331  DDIMER 2.66* 1.34*   No results for input(s): HGBA1C in the last 72 hours. No results for input(s): CHOL, HDL, LDLCALC, TRIG, CHOLHDL, LDLDIRECT in the last 72 hours. No results for input(s): TSH, T4TOTAL, T3FREE, THYROIDAB in the last 72 hours.  Invalid input(s): FREET3 Recent Labs    10/16/19 0433 10/17/19 0331  FERRITIN 656* 443*   Coags: No results for input(s): INR in the last 72 hours.  Invalid input(s): PT Microbiology: Recent Results (from the past 240 hour(s))  Novel Coronavirus, NAA (Hosp order, Send-out to Ref Lab; TAT 18-24 hrs     Status: None   Collection Time: 10/10/19  5:20 PM   Specimen:  Nasopharyngeal Swab; Respiratory  Result Value Ref Range Status   SARS-CoV-2, NAA NOT DETECTED NOT DETECTED Final    Comment: (NOTE) This nucleic acid amplification test was developed and its performance characteristics determined by [employer]. Nucleic acid amplification tests include RT-PCR and TMA. This test has not been FDA cleared or approved. This test has been authorized by FDA under an Emergency Use Authorization (EUA). This test is only authorized for the duration of time the declaration that circumstances exist justifying the authorization of the emergency use of in vitro diagnostic tests for detection of SARS-CoV-2 virus and/or diagnosis of COVID-19 infection under section 564(b)(1) of the Act, 21 U.S.C. 786VEH-2(C) (1), unless the authorization is terminated or revoked sooner. When diagnostic testing is negative, the possibility of a false negative result should be considered in the context of a patient's recent exposures and the presence of clinical signs and symptoms consistent with COVID-19. An individual without symptoms of COVID- 19 and who is not shedding SARS-CoV-2  virus would expect to have a negative (not detected) result in this assay. Performed At: [facility] [address] [place], [place] [id number] Rush Farmer MD MO:[id number]    Dustin  Final    Comment: Performed at [facility], [place] [address], [place], [place] [address]  Respiratory Panel by RT PCR (Flu A&B, Covid) - Nasopharyngeal Swab     Status: None   Collection Time: 10/11/19  1:34 AM   Specimen: Nasopharyngeal Swab  Result  Value Ref Range Status   SARS Coronavirus 2 by RT PCR NEGATIVE NEGATIVE Final    Comment: (NOTE) SARS-CoV-2 target nucleic acids are NOT DETECTED. The SARS-CoV-2 RNA is generally detectable in upper respiratoy specimens during the acute phase of infection. The lowest concentration of SARS-CoV-2 viral copies this assay  can detect is 131 copies/mL. A negative result does not preclude SARS-Cov-2 infection and should not be used as the sole basis for treatment or other patient management decisions. A negative result may occur with  improper specimen collection/handling, submission of specimen other than nasopharyngeal swab, presence of viral mutation(s) within the areas targeted by this assay, and inadequate number of viral copies (<131 copies/mL). A negative result must be combined with clinical observations, patient history, and epidemiological information. The expected result is Negative. Fact Sheet for Patients:  PinkCheek.be Fact Sheet for Healthcare Providers:  GravelBags.it This test is not yet ap proved or cleared by the [place] FDA and  has been authorized for detection and/or diagnosis of SARS-CoV-2 by FDA under an Emergency Use Authorization (EUA). This EUA will remain  in effect (meaning this test can be used) for the duration of the COVID-19 declaration under Section 564(b)(1) of the Act, 21 U.S.C. section 360bbb-3(b)(1), unless the authorization is terminated or revoked sooner.    Influenza A by PCR NEGATIVE NEGATIVE Final   Influenza B by PCR NEGATIVE NEGATIVE Final    Comment: (NOTE) The Xpert Xpress SARS-CoV-2/FLU/RSV assay is intended as an aid in  the diagnosis of influenza from Nasopharyngeal swab specimens and  should not be used as a sole basis for treatment. Nasal washings and  aspirates are unacceptable for Xpert Xpress SARS-CoV-2/FLU/RSV  testing. Fact Sheet for Patients: PinkCheek.be Fact Sheet for Healthcare Providers: GravelBags.it This test is not yet approved or cleared by the [place] FDA and  has been authorized for detection and/or diagnosis of SARS-CoV-2 by  FDA under an Emergency Use Authorization (EUA). This EUA will remain  in effect  (meaning this test can be used) for the duration of the  Covid-19 declaration under Section 564(b)(1) of the Act, 21  U.S.C. section 360bbb-3(b)(1), unless the authorization is  terminated or revoked. Performed at [facility], [place] [address] [place], [place] [address]   Blood Culture (routine x 2)     Status: Abnormal   Collection Time: 10/11/19  2:00 AM   Specimen: BLOOD  Result Value Ref Range Status   Specimen Description BLOOD LEFT ANTECUBITAL  Final   Special Requests   Final    BOTTLES DRAWN AEROBIC AND ANAEROBIC Blood Culture adequate volume   Culture  Setup Time   Final    GRAM POSITIVE RODS ANAEROBIC BOTTLE ONLY CRITICAL RESULT CALLED TO, READ BACK BY AND VERIFIED WITH: PHARMD C. AMEND [id number] FCP    Culture (A)  Final    PROPIONIBACTERIUM ACNES Standardized susceptibility testing for this organism is not available. Performed at [facility], [place] [address], [place], [place] [address]    Report Status 10/17/2019 FINAL  Final  Blood Culture (routine x 2)     Status: None   Collection Time: 10/11/19  2:00 AM   Specimen: BLOOD  Result Value Ref Range Status   Specimen Description BLOOD RIGHT ANTECUBITAL  Final   Special Requests   Final    BOTTLES DRAWN AEROBIC AND ANAEROBIC Blood Culture adequate volume   Culture   Final    NO GROWTH 5 DAYS Performed at [facility], [address], [place], [place] [address]    Report Status 10/16/2019 FINAL  Final  Culture, blood (routine x 2)     Status: None   Collection Time: 10/11/19  4:10 PM   Specimen: BLOOD LEFT HAND  Result Value Ref Range Status   Specimen Description BLOOD LEFT HAND  Final   Special Requests   Final    BOTTLES DRAWN AEROBIC ONLY Blood Culture results may not be optimal due to an inadequate volume of blood received in culture bottles   Culture   Final    NO GROWTH 5 DAYS Performed at [facility], [place] [address] [place], [place] [address]    Report Status 10/16/2019  FINAL  Final  Culture, blood (routine x 2)     Status: None   Collection Time: 10/11/19  4:11 PM   Specimen: BLOOD LEFT HAND  Result Value Ref Range Status   Specimen Description BLOOD LEFT HAND  Final   Special Requests   Final    BOTTLES DRAWN AEROBIC ONLY Blood Culture results may not be optimal due to an inadequate volume of blood received in culture bottles   Culture   Final    NO GROWTH 5 DAYS Performed at [facility], [place] [address] [place], [place] [address]    Report Status 10/16/2019 FINAL  Final  SARS CORONAVIRUS 2 (TAT 6-24 HRS) Nasopharyngeal Nasopharyngeal Swab     Status: Abnormal   Collection Time: 10/13/19 11:50 AM   Specimen: Nasopharyngeal Swab  Result Value Ref Range Status   SARS Coronavirus 2 POSITIVE (A) NEGATIVE Final    Comment: RESULT CALLED TO, READ BACK BY AND VERIFIED WITH: N.GOMEZ RN 1308 10/13/19 MCCORMICK K (NOTE) SARS-CoV-2 target nucleic acids are DETECTED. The SARS-CoV-2 RNA is generally detectable in upper and lower respiratory specimens during the acute phase of infection. Positive results are indicative of the presence of SARS-CoV-2 RNA. Clinical correlation with patient history and other diagnostic information is  necessary to determine patient infection status. Positive results do not rule out bacterial infection or co-infection with other viruses.  The expected result is Negative. Fact Sheet for Patients: SugarRoll.be Fact Sheet for Healthcare Providers: https://www.woods-mathews.com/ This test is not yet approved or cleared by the [place] FDA and  has been authorized for detection and/or diagnosis of SARS-CoV-2 by FDA under an Emergency Use Authorization (EUA). This EUA will remain  in effect (meaning this test can be used) for th e duration of the COVID-19 declaration under Section 564(b)(1) of the Act, 21 U.S.C. section 360bbb-3(b)(1), unless the authorization is terminated  or revoked sooner. Performed at [facility], [place] [address] [place], [place] [address]     FURTHER DISCHARGE INSTRUCTIONS:  Get Medicines reviewed and adjusted: Please take all your medications with you for your next visit with your Primary MD  Laboratory/radiological data: Please request your Primary MD to go over all hospital tests and procedure/radiological results at the follow up, please ask your Primary MD to get all Hospital records sent to his/her office.  In some cases, they will be blood work, cultures and biopsy results pending at the time of your discharge. Please request that your primary care M.D. goes through all the records of your hospital data and follows up on these results.  Also Note the following: If you experience worsening of your admission symptoms, develop shortness of breath, life threatening emergency, suicidal or homicidal thoughts you must seek medical attention immediately by calling  911 or calling your MD immediately  if symptoms less severe.  You must read complete instructions/literature along with all the possible adverse reactions/side effects for all the Medicines you take and that have been prescribed to you. Take any new Medicines after you have completely understood and accpet all the possible adverse reactions/side effects.   Do not drive when taking Pain medications or sleeping medications (Benzodaizepines)  Do not take more than prescribed Pain, Sleep and Anxiety Medications. It is not advisable to combine anxiety,sleep and pain medications without talking with your primary care practitioner  Special Instructions: If you have smoked or chewed Tobacco  in the last 2 yrs please stop smoking, stop any regular Alcohol  and or any Recreational drug use.  Wear Seat belts while driving.  Please note: You were cared for by a hospitalist during your hospital stay. Once you are discharged, your primary care physician will handle any further  medical issues. Please note that NO REFILLS for any discharge medications will be authorized once you are discharged, as it is imperative that you return to your primary care physician (or establish a relationship with a primary care physician if you do not have one) for your post hospital discharge needs so that they can reassess your need for medications and monitor your lab values.  Total Time spent coordinating discharge including counseling, education and face to face time equals 35 minutes.  SignedOren Binet 10/17/2019 9:36 AM

## 2019-10-17 NOTE — Progress Notes (Signed)
@  1030- Report given to [facility] Donia Pounds.  @ 1306 Patient was discharged to [facility] by MD order; discharged instructions  review and give to [facility] staff; IV DIC; skin intact. Will be transported to the Facility via [employer]

## 2019-10-17 NOTE — TOC Transition Note (Addendum)
Transition of Care [facility]) - CM/SW Discharge Note   Patient Details  Name: Jeanne [name] MRN: [id number] Date of Birth: 1936/04/15  [facility]) CM/SW Contact:  Benard Halsted, LCSW Phone Number: 10/17/2019, 10:55 AM   Clinical Narrative:    Patient will [facility] to: [facility] Anticipated DC date: 10/17/19 Family notified: Daughter, Academic librarian by: Corey Harold 12:30pm   Per MD patient ready for DC to [facility]. RN, patient, patient's family, and facility notified of DC. Discharge Summary and FL2 sent to facility. RN to call report prior to discharge [phone] Room 204). DC packet on chart. Ambulance transport requested for patient.   CSW will sign off for now as social work intervention is no longer needed. Please consult [place] again if new needs arise.  Cedric Fishman, LCSW Clinical Social Worker [phone]    Final next level of care: Skilled Nursing Facility Barriers to Discharge: No Barriers Identified   Patient Goals and CMS Choice Patient states their goals for this hospitalization and ongoing recovery are:: Rehab CMS Medicare.gov Compare Post Acute Care list provided to:: Patient Choice offered to / list presented to : Adult Children  Discharge Placement   Existing PASRR number confirmed : 10/17/19          Patient chooses bed at: [facility] Patient to be transferred to facility by: [place] Name of family member notified: Pricilla Handler, daughter Patient and family notified of of transfer: 10/17/19  Discharge Plan and Services In-house Referral: Clinical Social Work   Post Acute Care Choice: [place]                               Social Determinants of Health (SDOH) Interventions     Readmission Risk Interventions No flowsheet data found.

## 2019-10-17 NOTE — Care Management Important Message (Signed)
Important Message  Patient Details  Name: Jeanne [name] MRN: [id number] Date of Birth: July 05, 1936   Medicare Important Message Given:  Yes - Important Message mailed due to current National Emergency  Verbal consent obtained due to current National Emergency  Relationship to patient: Child Contact Name: Shamirah Ivan Call Date: 10/17/19  Time: 76 Phone: [phone] Outcome: No Answer/Busy Important Message mailed to: Patient address on file    Delorse Lek 10/17/2019, 10:21 AM

## 2019-10-17 NOTE — TOC Progression Note (Signed)
Transition of Care [facility]) - Progression Note    Patient Details  Name: Jeanne [name] MRN: [id number] Date of Birth: 26-Apr-1936  Transition of Care [facility]) CM/SW [place], [place] Phone Number: 10/17/2019, 8:41 AM  Clinical Narrative:    [facility] has received insurance approval (100% covered for up to 100 days for in-network and out of network benefits). CSW updated patient's daughter Pricilla Handler and let her know that private room is not yet available but they can move patient once it becomes available. She is requesting [facility] for transportation.    Expected Discharge Plan: [place] Barriers to Discharge: Continued Medical Work up  Expected Discharge Plan and Services Expected Discharge Plan: [place] In-house Referral: Clinical Social Work   Post Acute Care Choice: [place] Living arrangements for the past 2 months: Single Family Home                                       Social Determinants of Health (SDOH) Interventions    Readmission Risk Interventions No flowsheet data found.

## 2019-10-26 ENCOUNTER — Ambulatory Visit: Payer: Medicare Other

## 2019-11-05 ENCOUNTER — Ambulatory Visit: Payer: Medicare Other

## 2019-11-12 ENCOUNTER — Other Ambulatory Visit: Payer: Self-pay

## 2019-11-12 ENCOUNTER — Emergency Department (HOSPITAL_COMMUNITY)
Admission: EM | Admit: 2019-11-12 | Discharge: 2019-11-12 | Disposition: A | Discharge status: Home / Self Care | Payer: Medicare Other | Attending: Emergency Medicine | Admitting: Emergency Medicine

## 2019-11-12 ENCOUNTER — Emergency Department (HOSPITAL_COMMUNITY): Payer: Medicare Other

## 2019-11-12 ENCOUNTER — Encounter (HOSPITAL_COMMUNITY): Payer: Self-pay | Admitting: Emergency Medicine

## 2019-11-12 ENCOUNTER — Ambulatory Visit (HOSPITAL_COMMUNITY)
Admission: EM | Admit: 2019-11-12 | Discharge: 2019-11-12 | Disposition: A | Discharge status: Home / Self Care | Payer: Medicare Other | Source: Home / Self Care

## 2019-11-12 DIAGNOSIS — R1030 Lower abdominal pain, unspecified: Secondary | ICD-10-CM | POA: Insufficient documentation

## 2019-11-12 DIAGNOSIS — I5030 Unspecified diastolic (congestive) heart failure: Secondary | ICD-10-CM | POA: Insufficient documentation

## 2019-11-12 DIAGNOSIS — I2510 Atherosclerotic heart disease of native coronary artery without angina pectoris: Secondary | ICD-10-CM | POA: Insufficient documentation

## 2019-11-12 DIAGNOSIS — E876 Hypokalemia: Secondary | ICD-10-CM | POA: Diagnosis not present

## 2019-11-12 DIAGNOSIS — R531 Weakness: Secondary | ICD-10-CM | POA: Diagnosis not present

## 2019-11-12 DIAGNOSIS — I130 Hypertensive heart and chronic kidney disease with heart failure and stage 1 through stage 4 chronic kidney disease, or unspecified chronic kidney disease: Secondary | ICD-10-CM | POA: Diagnosis not present

## 2019-11-12 DIAGNOSIS — Z79899 Other long term (current) drug therapy: Secondary | ICD-10-CM | POA: Insufficient documentation

## 2019-11-12 DIAGNOSIS — Z96651 Presence of right artificial knee joint: Secondary | ICD-10-CM | POA: Insufficient documentation

## 2019-11-12 DIAGNOSIS — R197 Diarrhea, unspecified: Secondary | ICD-10-CM | POA: Diagnosis not present

## 2019-11-12 DIAGNOSIS — N183 Chronic kidney disease, stage 3 unspecified: Secondary | ICD-10-CM | POA: Insufficient documentation

## 2019-11-12 DIAGNOSIS — Z794 Long term (current) use of insulin: Secondary | ICD-10-CM | POA: Insufficient documentation

## 2019-11-12 DIAGNOSIS — E1122 Type 2 diabetes mellitus with diabetic chronic kidney disease: Secondary | ICD-10-CM | POA: Insufficient documentation

## 2019-11-12 DIAGNOSIS — R9341 Abnormal radiologic findings on diagnostic imaging of renal pelvis, ureter, or bladder: Secondary | ICD-10-CM | POA: Diagnosis not present

## 2019-11-12 DIAGNOSIS — Z7982 Long term (current) use of aspirin: Secondary | ICD-10-CM | POA: Diagnosis not present

## 2019-11-12 LAB — CBC
HCT: 40.3 % (ref 36.0–46.0)
Hemoglobin: 12.5 g/dL (ref 12.0–15.0)
MCH: 26.9 pg (ref 26.0–34.0)
MCHC: 31 g/dL (ref 30.0–36.0)
MCV: 86.7 fL (ref 80.0–100.0)
Platelets: 346 10*3/uL (ref 150–400)
RBC: 4.65 MIL/uL (ref 3.87–5.11)
RDW: 14.2 % (ref 11.5–15.5)
WBC: 9 10*3/uL (ref 4.0–10.5)
nRBC: 0 % (ref 0.0–0.2)

## 2019-11-12 LAB — COMPREHENSIVE METABOLIC PANEL
ALT: 10 U/L (ref 0–44)
AST: 16 U/L (ref 15–41)
Albumin: 2.7 g/dL — ABNORMAL LOW (ref 3.5–5.0)
Alkaline Phosphatase: 68 U/L (ref 38–126)
Anion gap: 14 (ref 5–15)
BUN: 13 mg/dL (ref 8–23)
CO2: 30 mmol/L (ref 22–32)
Calcium: 8.7 mg/dL — ABNORMAL LOW (ref 8.9–10.3)
Chloride: 92 mmol/L — ABNORMAL LOW (ref 98–111)
Creatinine, Ser: 2.58 mg/dL — ABNORMAL HIGH (ref 0.44–1.00)
GFR calc Af Amer: 19 mL/min — ABNORMAL LOW (ref >60)
GFR calc non Af Amer: 17 mL/min — ABNORMAL LOW (ref >60)
Glucose, Bld: 169 mg/dL — ABNORMAL HIGH (ref 70–99)
Potassium: 3 mmol/L — ABNORMAL LOW (ref 3.5–5.1)
Sodium: 136 mmol/L (ref 135–145)
Total Bilirubin: 0.5 mg/dL (ref 0.3–1.2)
Total Protein: 6.6 g/dL (ref 6.5–8.1)

## 2019-11-12 LAB — LIPASE, BLOOD: Lipase: 17 U/L (ref 11–51)

## 2019-11-12 MED ORDER — POTASSIUM CHLORIDE 10 MEQ/100ML IV SOLN
10.0000 meq | INTRAVENOUS | Status: AC
  Administered 2019-11-12: 10 meq via INTRAVENOUS
  Filled 2019-11-12: qty 100

## 2019-11-12 MED ORDER — POTASSIUM CHLORIDE CRYS ER 20 MEQ PO TBCR
40.0000 meq | EXTENDED_RELEASE_TABLET | Freq: Once | ORAL | Status: AC
  Administered 2019-11-12: 40 meq via ORAL
  Filled 2019-11-12: qty 2

## 2019-11-12 MED ORDER — MAGNESIUM OXIDE 400 (241.3 MG) MG PO TABS
800.0000 mg | ORAL_TABLET | Freq: Once | ORAL | Status: AC
  Administered 2019-11-12: 800 mg via ORAL
  Filled 2019-11-12: qty 2

## 2019-11-12 NOTE — ED Notes (Addendum)
Pt has had diarrhea for 1-2 weeks  No other symptoms accodring to the pt

## 2019-11-12 NOTE — ED Triage Notes (Signed)
Pt endorses diarrhea for a few days and feels dehydrated.

## 2019-11-12 NOTE — Discharge Instructions (Addendum)
Please discontinue using Colace as this could be exacerbating your diarrhea.  If you experience any fever, blood in your stool, worsening symptoms please return to the emergency department.  I have provided a copy of the report of your CT scan, you need to take this to your primary care physician in order to have appropriate follow-up for the renal mass.

## 2019-11-12 NOTE — ED Notes (Signed)
Brought pt to triage for provider eval. Jeanne [name] in for evaluation. Spoke with pt and family regarding necessity for pt to go to ER for higher level care 2/2 chief complaint diarrhea x3 days, weakness, heart and respiratory history. Pt and family verbalized understanding and stated en route to ER STAT.

## 2019-11-12 NOTE — ED Provider Notes (Signed)
[facility] Provider Note   CSN: [id number] Arrival date & time: 11/12/19  1243     History Chief Complaint  Patient presents with  . Diarrhea    Jeanne [name] is a 84 y.o. female.  84 y.o female with a PMH of NSVT, CKD 3, HTN, Diastolic dysfunction presents to the ED via POV for diarrhea.  According to patient she has been taking Colace daily for the past month.  Reports at first her stool was somewhat hard and therefore she added this medication, states her stool now has turned into watery diarrhea.  Reports she stopped taking Colace about 3 days ago however diarrhea has persisted.  She describes the diarrhea as watery, has had multiple episodes specially throughout the night.  She does report she is feeling somewhat weaker.  She also endorses lower abdominal pain.  Does report her diarrhea is getting somewhat better after stopping Colace.  She denies any fever, blood in her stool, recent antibiotic use, nausea or vomiting.  Of note, unknown surgeries to her abdomen.   The history is provided by the patient and medical records.  Diarrhea Associated symptoms: abdominal pain   Associated symptoms: no fever and no vomiting        Past Medical History:  Diagnosis Date  . Anemia   . Arthritis   . Carotid arterial disease ([place])   . Diabetes mellitus   . Hyperlipidemia   . Hypertension   . Migraine   . Obesity   . Reflux   . Sleep apnea     Patient Active Problem List   Diagnosis Date Noted  . Acute respiratory failure with hypoxia ([place]) 10/11/2019  . CAP (community acquired pneumonia) 10/11/2019  . Diastolic dysfunction, left ventricle 04/05/2019  . Atherosclerosis of native coronary artery of native heart without angina pectoris 11/23/2018  . Hypercholesteremia 11/23/2018  . Unstable angina ([place]) 10/12/2015  . Abnormal myocardial perfusion study 10/12/2015  . NSVT (nonsustained ventricular tachycardia) ([place]) 10/11/2015  .  Hypertension with heart disease 10/11/2015  . CKD (chronic kidney disease), stage III 10/11/2015  . Pain in the chest   . Chest pain 10/08/2015  . Diabetes mellitus type II, non insulin dependent ([place]) 10/08/2015  . AKI (acute kidney injury) ([place])   . Abscess of left arm 01/01/2013  . Migraine   . Morbid obesity ([place])   . Sleep apnea   . Stenosis of left carotid artery   . Reflux   . Anemia     Past Surgical History:  Procedure Laterality Date  . CARDIAC CATHETERIZATION N/A 10/12/2015   Procedure: Left Heart Cath and Coronary Angiography;  Surgeon: Belva Crome, MD;  Location: [facility];  Service: Cardiovascular;  Laterality: N/A;  . CHOLECYSTECTOMY  50 yrs ago  . JOINT REPLACEMENT Right 2005   knee  . WISDOM TOOTH EXTRACTION       OB History   No obstetric history on file.     Family History  Problem Relation Age of Onset  . Heart attack Father   . Heart attack Mother     Social History   Tobacco Use  . Smoking status: Never Smoker  . Smokeless tobacco: Never Used  Substance Use Topics  . Alcohol use: Never  . Drug use: No    Home Medications Prior to Admission medications   Medication Sig Start Date End Date Taking? Authorizing Provider  aspirin EC 81 MG tablet Take 81 mg by mouth daily.  Yes [provider]  Cholecalciferol (VITAMIN D3) 50 MCG (2000 UT) TABS Take 2,000 Units by mouth daily.  07/30/19  Yes [provider]  diltiazem (TIAZAC) 120 MG 24 hr capsule Take 120 mg by mouth in the morning and at bedtime.  09/08/19  Yes [provider]  ezetimibe (ZETIA) 10 MG tablet Take 10 mg by mouth daily.    Yes [provider]  ferrous sulfate 325 (65 FE) MG tablet Take 325 mg by mouth daily with breakfast.  07/30/19  Yes [provider]  furosemide (LASIX) 20 MG tablet TAKE 1 TABLET BY MOUTH EVERY DAY Patient taking differently: Take 20 mg by mouth daily.  07/01/19  Yes Adrian Prows, MD  hydrALAZINE  (APRESOLINE) 25 MG tablet Take 1 tablet (25 mg total) by mouth 3 (three) times daily. Patient taking differently: Take 25 mg by mouth daily.  04/28/19 10/10/28 Yes VyasSylvan Cheese, MD  hydrochlorothiazide (HYDRODIURIL) 50 MG tablet Take 50 mg by mouth daily. 08/24/19  Yes [provider]  insulin glargine (LANTUS) 100 UNIT/ML injection Inject 65 Units into the skin daily before breakfast.    Yes [provider]  latanoprost (XALATAN) 0.005 % ophthalmic solution Place 1 drop into the left eye at bedtime. 09/26/15  Yes [provider]  losartan (COZAAR) 25 MG tablet Take 50 mg by mouth daily.  10/18/19  Yes [provider]  Menthol, Topical Analgesic, (BIOFREEZE EX) Apply 1 application topically daily as needed (to affected areas for pain).    Yes [provider]  nitroGLYCERIN (NITROSTAT) 0.4 MG SL tablet Place 1 tablet (0.4 mg total) under the tongue as needed for chest pain. 09/08/19  Yes Miquel Dunn, NP  oxybutynin (DITROPAN-XL) 10 MG 24 hr tablet Take 10 mg by mouth daily.     Yes [provider]  pantoprazole (PROTONIX) 40 MG tablet Take 40 mg by mouth daily before breakfast.  08/25/15  Yes [provider]  Polyvinyl Alcohol-Povidone (REFRESH OP) Place 1 drop into both eyes at bedtime.   Yes [provider]  pravastatin (PRAVACHOL) 40 MG tablet Take 40 mg by mouth daily.    Yes [provider]  albuterol (PROVENTIL HFA;VENTOLIN HFA) 108 (90 Base) MCG/ACT inhaler Inhale 2 puffs into the lungs every 4 (four) hours as needed for wheezing or shortness of breath. Patient not taking: Reported on 11/12/2019 09/11/16   Melony Overly, MD  diltiazem [facility]) 240 MG 24 hr capsule Take 1 capsule (240 mg total) by mouth daily. Patient not taking: Reported on 11/12/2019 04/07/19   Despina Hick, MD  insulin aspart (NOVOLOG) 100 UNIT/ML injection 0-9 Units, Subcutaneous, 3 times daily with meals CBG < 70: Implement Hypoglycemia  measures/Call MD CBG 70 - 120: 0 units CBG 121 - 150: 1 unit CBG 151 - 200: 2 units CBG 201 - 250: 3 units CBG 251 - 300: 5 units CBG 301 - 350: 7 units CBG 351 - 400: 9 units CBG > 400: call MD Patient not taking: Reported on 11/12/2019 10/17/19   Jonetta Osgood, MD    Allergies    Amoxicillin and Invokana [canagliflozin]  Review of Systems   Review of Systems  Constitutional: Negative for fever.  HENT: Negative for sneezing and sore throat.   Respiratory: Negative for shortness of breath.   Cardiovascular: Negative for chest pain.  Gastrointestinal: Positive for abdominal pain and diarrhea. Negative for blood in stool, nausea and vomiting.  All other systems reviewed and are  negative.   Physical Exam Updated Vital Signs BP 120/71   Pulse (!) 102   Temp 98.2 F (36.8 C) (Oral)   Resp 16   Ht 5\' 2"  (1.575 m)   Wt (!) 151 kg   SpO2 96%   BMI 60.91 kg/m   Physical Exam Vitals and nursing note reviewed.  Constitutional:      Appearance: Normal appearance.  HENT:     Head: Normocephalic and atraumatic.     Nose: Nose normal.     Mouth/Throat:     Mouth: Mucous membranes are moist.  Eyes:     Pupils: Pupils are equal, round, and reactive to light.  Cardiovascular:     Rate and Rhythm: Normal rate.     Comments: No pitting edema, no swelling or calf tenderness.  Pulmonary:     Effort: Pulmonary effort is normal.     Breath sounds: No wheezing, rhonchi or rales.  Abdominal:     General: Abdomen is flat.     Palpations: Abdomen is soft.     Tenderness: There is no abdominal tenderness. There is no right CVA tenderness, left CVA tenderness or guarding.     Comments: Soft, nontender to palpation.  Bowel sounds are slightly diminished.  Musculoskeletal:        General: No swelling or tenderness.     Cervical back: Normal range of motion and neck supple.     Right lower leg: No edema.     Left lower leg: No edema.  Skin:    General: Skin is warm and dry.    Neurological:     Mental Status: She is alert and oriented to person, place, and time.     ED Results / Procedures / Treatments   Labs (all labs ordered are listed, but only abnormal results are displayed) Labs Reviewed  COMPREHENSIVE METABOLIC PANEL - Abnormal; Notable for the following components:      Result Value   Potassium 3.0 (*)    Chloride 92 (*)    Glucose, Bld 169 (*)    Creatinine, Ser 2.58 (*)    Calcium 8.7 (*)    Albumin 2.7 (*)    GFR calc non Af Amer 17 (*)    GFR calc Af Amer 19 (*)    All other components within normal limits  LIPASE, BLOOD  CBC  URINALYSIS, ROUTINE W REFLEX MICROSCOPIC    EKG None  Radiology CT ABDOMEN PELVIS WO CONTRAST  Result Date: 11/12/2019 CLINICAL DATA:  Abdominal distension. EXAM: CT ABDOMEN AND PELVIS WITHOUT CONTRAST TECHNIQUE: Multidetector CT imaging of the abdomen and pelvis was performed following the standard protocol without IV contrast. COMPARISON:  CT dated September 11, 2011. FINDINGS: Lower chest: There are coarse airspace opacities at the lung bases bilaterally, left worse than right.The heart is enlarged. Hepatobiliary: The liver is normal. Status post cholecystectomy.There is no biliary ductal dilation. Pancreas: Normal contours without ductal dilatation. No peripancreatic fluid collection. Spleen: No splenic laceration or hematoma. Adrenals/Urinary Tract: --Adrenal glands: No adrenal hemorrhage. --Right kidney/ureter: Multiple indeterminate nodules are noted rising from the right kidney. For example there is a 3.8 cm mass measuring approximately 43 Hounsfield units in the interpolar region of the right kidney. More inferiorly, there is a 2.4 cm hyperdense nodule. Posteriorly in the interpolar region there is a 3.9 cm cystic appearing mass. More superiorly, there is a 1.4 cm hyperdense nodule. There is no hydronephrosis. --Left kidney/ureter: No hydronephrosis or perinephric hematoma. --Urinary bladder: Unremarkable.  Stomach/Bowel: --Stomach/Duodenum:  No hiatal hernia or other gastric abnormality. Normal duodenal course and caliber. --Small bowel: No dilatation or inflammation. --Colon: There is diffuse circumferential wall thickening of the majority of the patient's: --Appendix: Normal. Vascular/Lymphatic: Atherosclerotic calcification is present within the non-aneurysmal abdominal aorta, without hemodynamically significant stenosis. --No retroperitoneal lymphadenopathy. --No mesenteric lymphadenopathy. --No pelvic or inguinal lymphadenopathy. Reproductive: The uterus is bulky and enlarged, felt to be secondary to underlying fibroids. Other: There is a fat containing umbilical hernia. There is no pneumoperitoneum. No significant free fluid in the abdomen. Musculoskeletal. There are extensive degenerative changes throughout the visualized lumbar spine. There is no acute displaced fracture. IMPRESSION: 1. There is diffuse circumferential wall thickening of the majority of the patient's colon, which may be seen in the setting of inflammatory or infectious colitis. 2. Multiple indeterminate right-sided renal masses are noted. Not all of these were accounted for on the patient's 04/14/2019 renal ultrasound. While many of the may represent proteinaceous or hemorrhagic cyst, a solid renal mass cannot be excluded on this study. A follow-up nonemergent outpatient renal mass protocol CT or MRI is recommended for further evaluation. 3. There are coarse airspace opacities at the lung bases bilaterally, left worse than right. Differential considerations include atelectasis versus pneumonia. 4. Probable fibroid uterus. 5. Aortic Atherosclerosis (ICD10-I70.0). Electronically Signed   By: Constance Holster M.D.   On: 11/12/2019 19:36   DG Chest Portable 1 View  Result Date: 11/12/2019 CLINICAL DATA:  Epigastric pain, diarrhea EXAM: PORTABLE CHEST 1 VIEW COMPARISON:  10/12/2019 FINDINGS: Stable mild cardiomegaly. Calcific aortic knob. Left  lung base is largely obscured. No new focal airspace consolidation. No large pleural fluid collection. No pneumothorax. Degenerative changes of the shoulders. IMPRESSION: No acute cardiopulmonary findings. Electronically Signed   By: Davina Poke D.O.   On: 11/12/2019 16:11    Procedures Procedures (including critical care time)  Medications Ordered in ED Medications  potassium chloride 10 mEq in 100 mL IVPB (has no administration in time range)  potassium chloride SA (KLOR-CON) CR tablet 40 mEq (40 mEq Oral Given 11/12/19 1607)    ED Course  I have reviewed the triage vital signs and the nursing notes.  Pertinent labs & imaging results that were available during my care of the patient were reviewed by me and considered in my medical decision making (see chart for details).    MDM Rules/Calculators/A&P     Patient with extensive past medical history including diastolic dysfunction, diabetes, CKD stage III currently not on dialysis presents to the ED with complaints of diarrhea for the past few weeks.  Patient began taking 1 daily Colace with her routine medication for a month.  Reports she stopped taking his medication about 3 days ago, episodes of diarrhea have seemed to improve however, she remains feeling weak all over.  Does report her stool began as hard and now has become liquidy watery diarrhea.  No fevers, recent antibiotic use, does endorse some lower abdominal pain but without focal point of tenderness.  Interpretation of her labs revealed a potassium with some mild hypokalemia, suspect this is likely due to rounds of diarrhea, will replace oral along with IV.  Creatinine level is worsened today, will provide patient with slight amount of fluid to help with hydration.  Lipase level is within normal limits.  CBC without any white count.  She does report having COVID-19 in the month of January, a chest x-ray was obtained to further evaluate her condition.  Chest x-ray did not show  any consolidation,  pneumothorax or worsening pneumonia.  She is currently receiving potassium via IV and oral.  After extensive review of her chart, last echocardiogram performed on July 2020 this was remarkable for: ejection  fraction of 60-65%.  CT abdomen and pelvis showed:  1. There is diffuse circumferential wall thickening of the majority  of the patient's colon, which may be seen in the setting of  inflammatory or infectious colitis.  2. Multiple indeterminate right-sided renal masses are noted. Not  all of these were accounted for on the patient's 04/14/2019 renal  ultrasound. While many of the may represent proteinaceous or  hemorrhagic cyst, a solid renal mass cannot be excluded on this  study. A follow-up nonemergent outpatient renal mass protocol CT or  MRI is recommended for further evaluation.  3. There are coarse airspace opacities at the lung bases  bilaterally, left worse than right. Differential considerations  include atelectasis versus pneumonia.  4. Probable fibroid uterus.  5. Aortic Atherosclerosis (ICD10-I70.0).     8:01 PM spoke to patient and discussed CT results with her at length including new finding of renal mass.  She was provided with a copy of her CT in order to follow-up with her PCP.  She has completed 1 round of IV potassium along with 2 doses of oral potassium.  Does feel improvement in her symptoms.  Unfortunately, due to to the late an IV patient only received 1 round of IV potassium.  She is requesting discharge home as daughter is currently waiting for her in the parking lot.  Advised to follow-up with PCP and provided with a copy of her CT scan.  Patient understands and agrees with management, return precautions discussed at length.   Portions of this note were generated with [employer]. Dictation errors may occur despite best attempts at proofreading.  Final Clinical Impression(s) / ED Diagnoses Final diagnoses:  Diarrhea,  unspecified type  Hypokalemia  Weakness    Rx / DC Orders ED Discharge Orders    None       Corinna Capra 11/12/19 2008    Lajean Saver, MD 11/15/19 1453

## 2019-11-12 NOTE — ED Notes (Signed)
Iv team in the dept  They have many consults

## 2019-11-12 NOTE — ED Notes (Signed)
Iv placed rt a-c  Pt c.o it buning before the pot drip was started  oiv removed  Iv team consult

## 2019-11-16 ENCOUNTER — Emergency Department (HOSPITAL_COMMUNITY)
Admission: EM | Admit: 2019-11-16 | Discharge: 2019-11-16 | Disposition: A | Discharge status: Home / Self Care | Payer: Medicare Other | Attending: Emergency Medicine | Admitting: Emergency Medicine

## 2019-11-16 ENCOUNTER — Other Ambulatory Visit: Payer: Self-pay

## 2019-11-16 ENCOUNTER — Encounter (HOSPITAL_COMMUNITY): Payer: Self-pay | Admitting: Emergency Medicine

## 2019-11-16 DIAGNOSIS — Z794 Long term (current) use of insulin: Secondary | ICD-10-CM | POA: Diagnosis not present

## 2019-11-16 DIAGNOSIS — Z79899 Other long term (current) drug therapy: Secondary | ICD-10-CM | POA: Diagnosis not present

## 2019-11-16 DIAGNOSIS — K6289 Other specified diseases of anus and rectum: Secondary | ICD-10-CM | POA: Diagnosis not present

## 2019-11-16 DIAGNOSIS — Z7982 Long term (current) use of aspirin: Secondary | ICD-10-CM | POA: Insufficient documentation

## 2019-11-16 DIAGNOSIS — N183 Chronic kidney disease, stage 3 unspecified: Secondary | ICD-10-CM | POA: Insufficient documentation

## 2019-11-16 DIAGNOSIS — Z8616 Personal history of COVID-19: Secondary | ICD-10-CM | POA: Diagnosis not present

## 2019-11-16 DIAGNOSIS — R531 Weakness: Secondary | ICD-10-CM | POA: Diagnosis present

## 2019-11-16 DIAGNOSIS — E1122 Type 2 diabetes mellitus with diabetic chronic kidney disease: Secondary | ICD-10-CM | POA: Insufficient documentation

## 2019-11-16 DIAGNOSIS — I129 Hypertensive chronic kidney disease with stage 1 through stage 4 chronic kidney disease, or unspecified chronic kidney disease: Secondary | ICD-10-CM | POA: Insufficient documentation

## 2019-11-16 DIAGNOSIS — E876 Hypokalemia: Secondary | ICD-10-CM | POA: Insufficient documentation

## 2019-11-16 HISTORY — DX: COVID-19: U07.1

## 2019-11-16 LAB — BASIC METABOLIC PANEL
Anion gap: 14 (ref 5–15)
BUN: 13 mg/dL (ref 8–23)
CO2: 28 mmol/L (ref 22–32)
Calcium: 8.6 mg/dL — ABNORMAL LOW (ref 8.9–10.3)
Chloride: 92 mmol/L — ABNORMAL LOW (ref 98–111)
Creatinine, Ser: 1.99 mg/dL — ABNORMAL HIGH (ref 0.44–1.00)
GFR calc Af Amer: 27 mL/min — ABNORMAL LOW (ref >60)
GFR calc non Af Amer: 23 mL/min — ABNORMAL LOW (ref >60)
Glucose, Bld: 121 mg/dL — ABNORMAL HIGH (ref 70–99)
Potassium: 2.9 mmol/L — ABNORMAL LOW (ref 3.5–5.1)
Sodium: 134 mmol/L — ABNORMAL LOW (ref 135–145)

## 2019-11-16 LAB — CBC
HCT: 38.4 % (ref 36.0–46.0)
Hemoglobin: 12.4 g/dL (ref 12.0–15.0)
MCH: 27.6 pg (ref 26.0–34.0)
MCHC: 32.3 g/dL (ref 30.0–36.0)
MCV: 85.5 fL (ref 80.0–100.0)
Platelets: 476 10*3/uL — ABNORMAL HIGH (ref 150–400)
RBC: 4.49 MIL/uL (ref 3.87–5.11)
RDW: 14.1 % (ref 11.5–15.5)
WBC: 8.6 10*3/uL (ref 4.0–10.5)
nRBC: 0.2 % (ref 0.0–0.2)

## 2019-11-16 MED ORDER — POTASSIUM CHLORIDE CRYS ER 20 MEQ PO TBCR
40.0000 meq | EXTENDED_RELEASE_TABLET | Freq: Once | ORAL | Status: AC
  Administered 2019-11-16: 40 meq via ORAL
  Filled 2019-11-16: qty 2

## 2019-11-16 MED ORDER — SODIUM CHLORIDE 0.9% FLUSH
3.0000 mL | Freq: Once | INTRAVENOUS | Status: AC
  Administered 2019-11-16: 11:00:00 3 mL via INTRAVENOUS

## 2019-11-16 MED ORDER — SODIUM CHLORIDE 0.9 % IV BOLUS
500.0000 mL | Freq: Once | INTRAVENOUS | Status: AC
  Administered 2019-11-16: 500 mL via INTRAVENOUS

## 2019-11-16 NOTE — ED Triage Notes (Signed)
Reports generalized weakness that is worse today.  Seen on 3/10 for diarrhea that she states has gotten better but every time she eats she has the urge to have a bowel movement and has to stay close to bathroom.  States sometimes she is able to have a bowel movement and other times only has the urge.  Reports rectal pain.  States she was COVID + 2/5.

## 2019-11-16 NOTE — ED Notes (Signed)
Daughter, [name], to be updated at [phone].

## 2019-11-16 NOTE — ED Provider Notes (Signed)
[facility] EMERGENCY DEPARTMENT Provider Note   CSN: [id number] Arrival date & time: 11/16/19  7829     History Chief Complaint  Patient presents with  . Weakness  . Rectal Pain    Jeanne [name] is a 84 y.o. female.  84 year old female with past medical history of hypertension, hyperlipidemia, diabetes presents with complaint of generalized weakness onset this morning.  Patient states that she was constipated, took Colace and had diarrhea described as loose/watery stools for several days even after stopping the Colace.  Patient was seen in this emergency room for same 4 days ago, states that she was told she was dehydrated and returned home.  Patient states that she has continued to have the urge to pass stool especially with eating, states that she has to push hard in order to have a bowel movement and the stool that she passes is yellow and watery.  Patient denies any abdominal pain, fevers, changes in bladder habits. Reports rectal soreness from frequent stools.         Past Medical History:  Diagnosis Date  . Anemia   . Arthritis   . Carotid arterial disease ([place])   . COVID-19   . Diabetes mellitus   . Hyperlipidemia   . Hypertension   . Migraine   . Obesity   . Reflux   . Sleep apnea     Patient Active Problem List   Diagnosis Date Noted  . Acute respiratory failure with hypoxia ([place]) 10/11/2019  . CAP (community acquired pneumonia) 10/11/2019  . Diastolic dysfunction, left ventricle 04/05/2019  . Atherosclerosis of native coronary artery of native heart without angina pectoris 11/23/2018  . Hypercholesteremia 11/23/2018  . Unstable angina ([place]) 10/12/2015  . Abnormal myocardial perfusion study 10/12/2015  . NSVT (nonsustained ventricular tachycardia) ([place]) 10/11/2015  . Hypertension with heart disease 10/11/2015  . CKD (chronic kidney disease), stage III 10/11/2015  . Pain in the chest   . Chest pain 10/08/2015  . Diabetes mellitus  type II, non insulin dependent ([place]) 10/08/2015  . AKI (acute kidney injury) ([place])   . Abscess of left arm 01/01/2013  . Migraine   . Morbid obesity ([place])   . Sleep apnea   . Stenosis of left carotid artery   . Reflux   . Anemia     Past Surgical History:  Procedure Laterality Date  . CARDIAC CATHETERIZATION N/A 10/12/2015   Procedure: Left Heart Cath and Coronary Angiography;  Surgeon: Belva Crome, MD;  Location: [place] [facility];  Service: Cardiovascular;  Laterality: N/A;  . CHOLECYSTECTOMY  50 yrs ago  . JOINT REPLACEMENT Right 2005   knee  . WISDOM TOOTH EXTRACTION       OB History   No obstetric history on file.     Family History  Problem Relation Age of Onset  . Heart attack Father   . Heart attack Mother     Social History   Tobacco Use  . Smoking status: Never Smoker  . Smokeless tobacco: Never Used  Substance Use Topics  . Alcohol use: Never  . Drug use: No    Home Medications Prior to Admission medications   Medication Sig Start Date End Date Taking? Authorizing Provider  albuterol (PROVENTIL HFA;VENTOLIN HFA) 108 (90 Base) MCG/ACT inhaler Inhale 2 puffs into the lungs every 4 (four) hours as needed for wheezing or shortness of breath. Patient not taking: Reported on 11/12/2019 09/11/16   Melony Overly, MD  aspirin EC 81  MG tablet Take 81 mg by mouth daily.      [provider]  Cholecalciferol (VITAMIN D3) 50 MCG (2000 UT) TABS Take 2,000 Units by mouth daily.  07/30/19   [provider]  diltiazem (TIAZAC) 120 MG 24 hr capsule Take 120 mg by mouth in the morning and at bedtime.  09/08/19   [provider]  diltiazem (TIAZAC) 240 MG 24 hr capsule Take 1 capsule (240 mg total) by mouth daily. Patient not taking: Reported on 11/12/2019 04/07/19   Despina Hick, MD  ezetimibe (ZETIA) 10 MG tablet Take 10 mg by mouth daily.     [provider]  ferrous sulfate 325 (65 FE) MG tablet Take 325 mg by mouth daily with breakfast.   07/30/19   [provider]  furosemide (LASIX) 20 MG tablet TAKE 1 TABLET BY MOUTH EVERY DAY Patient taking differently: Take 20 mg by mouth daily.  07/01/19   Adrian Prows, MD  hydrALAZINE (APRESOLINE) 25 MG tablet Take 1 tablet (25 mg total) by mouth 3 (three) times daily. Patient taking differently: Take 25 mg by mouth daily.  04/28/19 10/10/28  Despina Hick, MD  hydrochlorothiazide (HYDRODIURIL) 50 MG tablet Take 50 mg by mouth daily. 08/24/19   [provider]  insulin aspart (NOVOLOG) 100 UNIT/ML injection 0-9 Units, Subcutaneous, 3 times daily with meals CBG < 70: Implement Hypoglycemia measures/Call MD CBG 70 - 120: 0 units CBG 121 - 150: 1 unit CBG 151 - 200: 2 units CBG 201 - 250: 3 units CBG 251 - 300: 5 units CBG 301 - 350: 7 units CBG 351 - 400: 9 units CBG > 400: call MD Patient not taking: Reported on 11/12/2019 10/17/19   Jonetta Osgood, MD  insulin glargine (LANTUS) 100 UNIT/ML injection Inject 65 Units into the skin daily before breakfast.     [provider]  latanoprost (XALATAN) 0.005 % ophthalmic solution Place 1 drop into the left eye at bedtime. 09/26/15   [provider]  losartan (COZAAR) 25 MG tablet Take 50 mg by mouth daily.  10/18/19   [provider]  Menthol, Topical Analgesic, (BIOFREEZE EX) Apply 1 application topically daily as needed (to affected areas for pain).     [provider]  nitroGLYCERIN (NITROSTAT) 0.4 MG SL tablet Place 1 tablet (0.4 mg total) under the tongue as needed for chest pain. 09/08/19   Miquel Dunn, NP  oxybutynin (DITROPAN-XL) 10 MG 24 hr tablet Take 10 mg by mouth daily.      [provider]  pantoprazole (PROTONIX) 40 MG tablet Take 40 mg by mouth daily before breakfast.  08/25/15   [provider]  Polyvinyl Alcohol-Povidone (REFRESH OP) Place 1 drop into both eyes at bedtime.    [provider]  pravastatin (PRAVACHOL) 40 MG tablet Take 40 mg  by mouth daily.     [provider]    Allergies    Amoxicillin and Invokana [canagliflozin]  Review of Systems   Review of Systems  Constitutional: Negative for fever.  Respiratory: Negative for shortness of breath.   Cardiovascular: Negative for chest pain.  Gastrointestinal: Positive for diarrhea. Negative for abdominal pain, blood in stool, constipation, nausea and vomiting.  Musculoskeletal: Negative for arthralgias and myalgias.  Skin: Negative for rash and wound.  Allergic/Immunologic: Positive for immunocompromised state.  Neurological: Positive for weakness. Negative for dizziness.  All other systems reviewed and are negative.   Physical Exam Updated Vital Signs BP 126/71  Pulse 88   Temp 98.2 F (36.8 C) (Oral)   Resp 19   Ht 5\' 2"  (1.575 m)   Wt (!) 149.7 kg   SpO2 98%   BMI 60.36 kg/m   Physical Exam Vitals and nursing note reviewed. Exam conducted with a chaperone present.  Constitutional:      General: She is not in acute distress.    Appearance: She is well-developed. She is not diaphoretic.  HENT:     Head: Normocephalic and atraumatic.  Cardiovascular:     Rate and Rhythm: Normal rate and regular rhythm.     Pulses: Normal pulses.     Heart sounds: Normal heart sounds.  Pulmonary:     Effort: Pulmonary effort is normal.     Breath sounds: Normal breath sounds.  Abdominal:     Palpations: Abdomen is soft.     Tenderness: There is no abdominal tenderness.  Genitourinary:    Rectum: No tenderness, anal fissure or external hemorrhoid.  Musculoskeletal:     Right lower leg: No edema.     Left lower leg: No edema.  Skin:    General: Skin is warm and dry.     Findings: No erythema or rash.  Neurological:     Mental Status: She is alert and oriented to person, place, and time.  Psychiatric:        Behavior: Behavior normal.     ED Results / Procedures / Treatments   Labs (all labs ordered are listed, but only abnormal results are  displayed) Labs Reviewed  BASIC METABOLIC PANEL - Abnormal; Notable for the following components:      Result Value   Sodium 134 (*)    Potassium 2.9 (*)    Chloride 92 (*)    Glucose, Bld 121 (*)    Creatinine, Ser 1.99 (*)    Calcium 8.6 (*)    GFR calc non Af Amer 23 (*)    GFR calc Af Amer 27 (*)    All other components within normal limits  CBC - Abnormal; Notable for the following components:   Platelets 476 (*)    All other components within normal limits    EKG EKG Interpretation  Date/Time:  Sunday November 16 2019 09:33:13 EDT Ventricular Rate:  91 PR Interval:  128 QRS Duration: 90 QT Interval:  396 QTC Calculation: 487 R Axis:   -56 Text Interpretation: Unusual P axis, possible ectopic atrial rhythm with Premature atrial complexes Low voltage QRS Left anterior fascicular block Cannot rule out Anterior infarct , age undetermined Abnormal ECG since last tracing no significant change Confirmed by Noemi Chapel [phone]) on 11/16/2019 9:46:58 AM   Radiology No results found.  Procedures Procedures (including critical care time)  Medications Ordered in ED Medications  sodium chloride flush (NS) 0.9 % injection 3 mL (3 mLs Intravenous Given 11/16/19 1120)  sodium chloride 0.9 % bolus 500 mL (0 mLs Intravenous Stopped 11/16/19 1229)  potassium chloride SA (KLOR-CON) CR tablet 40 mEq (40 mEq Oral Given 11/16/19 1121)    ED Course  I have reviewed the triage vital signs and the nursing notes.  Pertinent labs & imaging results that were available during my care of the patient were reviewed by me and considered in my medical decision making (see chart for details).  Clinical Course as of Nov 16 1235  Sun Nov 16, 2019  1228 84yo female with complaint of generalized weakness and fecal urgency. Patient had onset of diarrhea after taking Colace, stopped  the Colace and continued to have loose/watery stools. Patient was seen in the ER, labs checked and discharged home with BRAT  diet instructions. Patient has been drinking grape, cranberry, apple juice to hydrate at home.  Potassium was found to be 2.9, replaced orally in the ER today. CBC without significant findings. Patient was given 554mL fluid bolus, no further stool events in the ER. Abdomen is soft and non tender, rectum unremarkable- no evidence of skin breakdown, patient points to coccyx as to location of her discomfort- suspect this is secondary to pressure in the area from staying in bed so much recently.  Discussed findings and plan of care with patient and her daughter- recommend stopping all fruit juice- suspect fecal urgency secondary to fructose. Replace fruit juice with sugar free gatorade. Follow up with PCP this week, may need PT/OT assistance in the home.    [LM]    Clinical Course User Index [LM] Roque Lias   MDM Rules/Calculators/A&P                      Final Clinical Impression(s) / ED Diagnoses Final diagnoses:  Weakness  Hypokalemia    Rx / DC Orders ED Discharge Orders    None       Roque Lias 11/16/19 1237    Noemi Chapel, MD 11/16/19 1943

## 2019-11-16 NOTE — ED Provider Notes (Signed)
This patient is a morbidly obese but very friendly 84 year old female who is unfortunately had some diarrhea this week.  She feels the need to push to have a bowel movement, she is not having any urinary symptoms but ultimately comes in today because of progressive fatigue and weakness.  On my exam the patient is well-appearing without distress, she is not tachycardic and has clear lung sounds, speaks in full sentences, essentially nontender abdomen.  Hypokalemic, this will need to be replaced  Otherwise this patient is well-appearing and I anticipate discharge.  Medical screening examination/treatment/procedure(s) were conducted as a shared visit with non-physician practitioner(s) and myself.  I personally evaluated the patient during the encounter.  Clinical Impression:   Final diagnoses:  Weakness  Hypokalemia         Noemi Chapel, MD 11/16/19 1943

## 2019-11-16 NOTE — ED Notes (Signed)
Patient verbalizes understanding of discharge instructions. Opportunity for questioning and answers were provided. Armband removed by staff, pt discharged from ED.  

## 2019-11-16 NOTE — Discharge Instructions (Signed)
Follow up with your doctor for recheck this week.  STOP fruit juice- sometime fructose (the naturally occurring sugar in fruit) can have a laxative effect.  Use sugar free gatorade and water to stay hydrated.  Work on gradually improving physical endurance. This may take time, reach out to your doctor if you need assistance from physical or occupational therapy.

## 2019-11-27 ENCOUNTER — Other Ambulatory Visit: Payer: Self-pay | Admitting: Nephrology

## 2019-11-27 DIAGNOSIS — N2889 Other specified disorders of kidney and ureter: Secondary | ICD-10-CM

## 2019-12-02 ENCOUNTER — Other Ambulatory Visit: Payer: Medicare Other

## 2019-12-03 ENCOUNTER — Other Ambulatory Visit: Payer: Medicare Other

## 2019-12-21 ENCOUNTER — Other Ambulatory Visit: Payer: Self-pay

## 2019-12-21 ENCOUNTER — Other Ambulatory Visit: Payer: Self-pay | Admitting: Cardiology

## 2019-12-21 ENCOUNTER — Ambulatory Visit
Admission: RE | Admit: 2019-12-21 | Discharge: 2019-12-21 | Disposition: A | Discharge status: Home / Self Care | Payer: Medicare Other | Source: Ambulatory Visit | Attending: Nephrology | Admitting: Nephrology

## 2019-12-21 DIAGNOSIS — N2889 Other specified disorders of kidney and ureter: Secondary | ICD-10-CM

## 2019-12-21 DIAGNOSIS — I5032 Chronic diastolic (congestive) heart failure: Secondary | ICD-10-CM

## 2019-12-21 DIAGNOSIS — I519 Heart disease, unspecified: Secondary | ICD-10-CM

## 2020-01-13 ENCOUNTER — Inpatient Hospital Stay: Payer: Medicare Other | Attending: Oncology

## 2020-01-13 DIAGNOSIS — R779 Abnormality of plasma protein, unspecified: Secondary | ICD-10-CM | POA: Insufficient documentation

## 2020-01-19 ENCOUNTER — Telehealth: Payer: Self-pay | Admitting: Oncology

## 2020-01-19 NOTE — Telephone Encounter (Signed)
Called pt per 5/17 sch message- no answer . Left message for pt to call back to reschedule.

## 2020-01-20 ENCOUNTER — Inpatient Hospital Stay: Payer: Medicare Other | Admitting: Oncology

## 2020-01-27 ENCOUNTER — Other Ambulatory Visit: Payer: Self-pay

## 2020-01-27 ENCOUNTER — Inpatient Hospital Stay: Payer: Medicare Other

## 2020-01-27 DIAGNOSIS — D472 Monoclonal gammopathy: Secondary | ICD-10-CM

## 2020-01-27 DIAGNOSIS — R779 Abnormality of plasma protein, unspecified: Secondary | ICD-10-CM | POA: Diagnosis not present

## 2020-01-27 LAB — CBC WITH DIFFERENTIAL (CANCER CENTER ONLY)
Abs Immature Granulocytes: 0.02 10*3/uL (ref 0.00–0.07)
Basophils Absolute: 0 10*3/uL (ref 0.0–0.1)
Basophils Relative: 1 %
Eosinophils Absolute: 0.2 10*3/uL (ref 0.0–0.5)
Eosinophils Relative: 3 %
HCT: 37.6 % (ref 36.0–46.0)
Hemoglobin: 12 g/dL (ref 12.0–15.0)
Immature Granulocytes: 0 %
Lymphocytes Relative: 27 %
Lymphs Abs: 1.8 10*3/uL (ref 0.7–4.0)
MCH: 27.3 pg (ref 26.0–34.0)
MCHC: 31.9 g/dL (ref 30.0–36.0)
MCV: 85.5 fL (ref 80.0–100.0)
Monocytes Absolute: 0.6 10*3/uL (ref 0.1–1.0)
Monocytes Relative: 9 %
Neutro Abs: 4.2 10*3/uL (ref 1.7–7.7)
Neutrophils Relative %: 60 %
Platelet Count: 200 10*3/uL (ref 150–400)
RBC: 4.4 MIL/uL (ref 3.87–5.11)
RDW: 14.2 % (ref 11.5–15.5)
WBC Count: 6.9 10*3/uL (ref 4.0–10.5)
nRBC: 0 % (ref 0.0–0.2)

## 2020-01-27 LAB — CMP (CANCER CENTER ONLY)
ALT: 9 U/L (ref 0–44)
AST: 12 U/L — ABNORMAL LOW (ref 15–41)
Albumin: 3.2 g/dL — ABNORMAL LOW (ref 3.5–5.0)
Alkaline Phosphatase: 78 U/L (ref 38–126)
Anion gap: 6 (ref 5–15)
BUN: 22 mg/dL (ref 8–23)
CO2: 33 mmol/L — ABNORMAL HIGH (ref 22–32)
Calcium: 9.3 mg/dL (ref 8.9–10.3)
Chloride: 101 mmol/L (ref 98–111)
Creatinine: 1.63 mg/dL — ABNORMAL HIGH (ref 0.44–1.00)
GFR, Est AFR Am: 34 mL/min — ABNORMAL LOW (ref >60)
GFR, Estimated: 29 mL/min — ABNORMAL LOW (ref >60)
Glucose, Bld: 250 mg/dL — ABNORMAL HIGH (ref 70–99)
Potassium: 3.5 mmol/L (ref 3.5–5.1)
Sodium: 140 mmol/L (ref 135–145)
Total Bilirubin: 0.3 mg/dL (ref 0.3–1.2)
Total Protein: 6.9 g/dL (ref 6.5–8.1)

## 2020-01-28 LAB — KAPPA/LAMBDA LIGHT CHAINS
Kappa free light chain: 39.8 mg/L — ABNORMAL HIGH (ref 3.3–19.4)
Kappa, lambda light chain ratio: 1.84 — ABNORMAL HIGH (ref 0.26–1.65)
Lambda free light chains: 21.6 mg/L (ref 5.7–26.3)

## 2020-01-30 LAB — MULTIPLE MYELOMA PANEL, SERUM
Albumin SerPl Elph-Mcnc: 3.1 g/dL (ref 2.9–4.4)
Albumin/Glob SerPl: 1.1 (ref 0.7–1.7)
Alpha 1: 0.3 g/dL (ref 0.0–0.4)
Alpha2 Glob SerPl Elph-Mcnc: 0.9 g/dL (ref 0.4–1.0)
B-Globulin SerPl Elph-Mcnc: 1.1 g/dL (ref 0.7–1.3)
Gamma Glob SerPl Elph-Mcnc: 0.8 g/dL (ref 0.4–1.8)
Globulin, Total: 3.1 g/dL (ref 2.2–3.9)
IgA: 292 mg/dL (ref 64–422)
IgG (Immunoglobin G), Serum: 927 mg/dL (ref 586–1602)
IgM (Immunoglobulin M), Srm: 98 mg/dL (ref 26–217)
Total Protein ELP: 6.2 g/dL (ref 6.0–8.5)

## 2020-02-03 ENCOUNTER — Other Ambulatory Visit: Payer: Self-pay

## 2020-02-03 ENCOUNTER — Inpatient Hospital Stay: Payer: Medicare Other | Attending: Oncology | Admitting: Oncology

## 2020-02-03 VITALS — BP 163/75 | HR 99 | Temp 97.3°F | Resp 18 | Ht 62.0 in | Wt 314.3 lb

## 2020-02-03 DIAGNOSIS — N289 Disorder of kidney and ureter, unspecified: Secondary | ICD-10-CM | POA: Diagnosis not present

## 2020-02-03 DIAGNOSIS — D472 Monoclonal gammopathy: Secondary | ICD-10-CM | POA: Insufficient documentation

## 2020-02-03 DIAGNOSIS — Z8616 Personal history of COVID-19: Secondary | ICD-10-CM | POA: Insufficient documentation

## 2020-02-03 NOTE — Progress Notes (Signed)
Hematology and Oncology Follow Up Visit  Jeanne [name] [id number] 11/07/1935 84 y.o. 02/03/2020 12:15 PM Polite, Jori Moll, MDPolite, Jori Moll, MD   Principle Diagnosis: 84 year old woman with monoclonal gammopathy diagnosed in October 2020.  She was found to have M spike of 0.6 g/dL with elevated free kappa light chain and biclonal distribution of her M protein.    Current therapy: Active surveillance.  Interim History: Jeanne [name] returns today for a repeat evaluation.  Since the last visit, she was hospitalized in February 2021 with Covid pneumonia with concurrent bacterial infection causing respiratory failure.  She has recovered reasonably well and since her discharge her respiratory status has been stable.  She denies any bone pain or pathological fractures.  He denies any changes in her appetite weakness.  She does ambulate with the help of a cane.  She has been also evaluated by Dr. Tresa Moore for presumed kidney mass with follow-up MRI in the future.     Medications: I have reviewed the patient's current medications.  Current Outpatient Medications  Medication Sig Dispense Refill  . albuterol (PROVENTIL HFA;VENTOLIN HFA) 108 (90 Base) MCG/ACT inhaler Inhale 2 puffs into the lungs every 4 (four) hours as needed for wheezing or shortness of breath. (Patient not taking: Reported on 11/12/2019) 1 Inhaler 0  . aspirin EC 81 MG tablet Take 81 mg by mouth daily.      . Cholecalciferol (VITAMIN D3) 50 MCG (2000 UT) TABS Take 2,000 Units by mouth daily.     Marland Kitchen diltiazem (TIAZAC) 120 MG 24 hr capsule Take 120 mg by mouth in the morning and at bedtime.     Marland Kitchen diltiazem (TIAZAC) 240 MG 24 hr capsule Take 1 capsule (240 mg total) by mouth daily. (Patient not taking: Reported on 11/12/2019) 30 capsule 0  . ezetimibe (ZETIA) 10 MG tablet Take 10 mg by mouth daily.     . ferrous sulfate 325 (65 FE) MG tablet Take 325 mg by mouth daily with breakfast.     . furosemide (LASIX) 20 MG tablet TAKE 1 TABLET BY  MOUTH EVERY DAY 90 tablet 1  . hydrALAZINE (APRESOLINE) 25 MG tablet Take 1 tablet (25 mg total) by mouth 3 (three) times daily. (Patient taking differently: Take 25 mg by mouth daily. ) 270 tablet 3  . hydrochlorothiazide (HYDRODIURIL) 50 MG tablet Take 50 mg by mouth daily.    . insulin aspart (NOVOLOG) 100 UNIT/ML injection 0-9 Units, Subcutaneous, 3 times daily with meals CBG < 70: Implement Hypoglycemia measures/Call MD CBG 70 - 120: 0 units CBG 121 - 150: 1 unit CBG 151 - 200: 2 units CBG 201 - 250: 3 units CBG 251 - 300: 5 units CBG 301 - 350: 7 units CBG 351 - 400: 9 units CBG > 400: call MD (Patient not taking: Reported on 11/12/2019) 10 mL 11  . insulin glargine (LANTUS) 100 UNIT/ML injection Inject 65 Units into the skin daily before breakfast.     . latanoprost (XALATAN) 0.005 % ophthalmic solution Place 1 drop into the left eye at bedtime.  5  . losartan (COZAAR) 25 MG tablet Take 50 mg by mouth daily.     . Menthol, Topical Analgesic, (BIOFREEZE EX) Apply 1 application topically daily as needed (to affected areas for pain).     . nitroGLYCERIN (NITROSTAT) 0.4 MG SL tablet Place 1 tablet (0.4 mg total) under the tongue as needed for chest pain. 25 tablet 3  . oxybutynin (DITROPAN-XL) 10 MG 24 hr tablet Take 10 mg  by mouth daily.      . pantoprazole (PROTONIX) 40 MG tablet Take 40 mg by mouth daily before breakfast.     . Polyvinyl Alcohol-Povidone (REFRESH OP) Place 1 drop into both eyes at bedtime.    . pravastatin (PRAVACHOL) 40 MG tablet Take 40 mg by mouth daily.      No current facility-administered medications for this visit.     Allergies:  Allergies  Allergen Reactions  . Amoxicillin Nausea Only    Has patient had a PCN reaction causing immediate rash, facial/tongue/throat swelling, SOB or lightheadedness with hypotension: "yes, i was dizzy" Has patient had a PCN reaction causing severe rash involving mucus membranes or skin necrosis: no Did a PCN reaction that  required hospitalization : no, called the DR. Did PCN reaction occurring within the last 10 years: unk if all of the above answers are "NO", then may proceed with Cephalosporin use.   [name] [Canagliflozin] Nausea Only and Other (See Comments)    Dizziness       Physical Exam: Blood pressure (!) 163/75, pulse 99, temperature (!) 97.3 F (36.3 C), temperature source Temporal, resp. rate 18, height '5\' 2"'  (1.575 m), weight (!) 314 lb 4.8 oz (142.6 kg), SpO2 100 %.   ECOG:  1   General appearance: Comfortable appearing without any discomfort Head: Normocephalic without any trauma Oropharynx: Mucous membranes are moist and pink without any thrush or ulcers. Eyes: Pupils are equal and round reactive to light. Lymph nodes: No cervical, supraclavicular, inguinal or axillary lymphadenopathy.   Heart:regular rate and rhythm.  S1 and S2 without leg edema. Lung: Clear without any rhonchi or wheezes.  No dullness to percussion. Abdomin: Soft, nontender, nondistended with good bowel sounds.  No hepatosplenomegaly. Musculoskeletal: No joint deformity or effusion.  Full range of motion noted. Neurological: No deficits noted on motor, sensory and deep tendon reflex exam. Skin: No petechial rash or dryness.  Appeared moist.      Lab Results: Lab Results  Component Value Date   WBC 6.9 01/27/2020   HGB 12.0 01/27/2020   HCT 37.6 01/27/2020   MCV 85.5 01/27/2020   PLT 200 01/27/2020     Chemistry      Component Value Date/Time   NA 140 01/27/2020 1044   K 3.5 01/27/2020 1044   CL 101 01/27/2020 1044   CO2 33 (H) 01/27/2020 1044   BUN 22 01/27/2020 1044   CREATININE 1.63 (H) 01/27/2020 1044      Component Value Date/Time   CALCIUM 9.3 01/27/2020 1044   ALKPHOS 78 01/27/2020 1044   AST 12 (L) 01/27/2020 1044   ALT 9 01/27/2020 1044   BILITOT 0.3 01/27/2020 1044     Results for Jeanne [name], Jeanne [name] (MRN [id number]) as of 02/03/2020 12:19  Ref. Range 01/27/2020 10:45  M Protein  SerPl Elph-Mcnc Latest Ref Range: Not Observed g/dL Not Observed  IFE 1 Unknown Comment (A)  Globulin, Total Latest Ref Range: 2.2 - 3.9 g/dL 3.1  B-Globulin SerPl Elph-Mcnc Latest Ref Range: 0.7 - 1.3 g/dL 1.1  IgG (Immunoglobin G), Serum Latest Ref Range: 586 - 1,602 mg/dL 927  IgM (Immunoglobulin M), Srm Latest Ref Range: 26 - 217 mg/dL 98  IgA Latest Ref Range: 64 - 422 mg/dL 292    Results for Jeanne [name], Jeanne [name] (MRN [id number]) as of 02/03/2020 12:19  Ref. Range 01/27/2020 10:44  Kappa free light chain Latest Ref Range: 3.3 - 19.4 mg/L 39.8 (H)  Lamda free light chains Latest Ref Range:  5.7 - 26.3 mg/L 21.6  Kappa, lamda light chain ratio Latest Ref Range: 0.26 - 1.65  1.84 (H)   IMPRESSION: 1. Multiple indeterminate renal cortical lesions throughout the right kidney, as detailed, incompletely characterized on this noncontrast MRI study. Differential continues to include benign hemorrhagic/proteinaceous complex renal cysts versus renal cell carcinoma. Follow-up MRI abdomen without and with IV contrast is recommended in 3-6 months. Please note that assuming no history of IV gadolinium contrast allergy, the patient should be able to receive IV gadolinium contrast even if renal function is compromised. 2. No abdominal adenopathy. 3. Small hiatal hernia.   Impression and Plan:   84 year old woman:  1. 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  He did have a small M spike detected in October 2020.  Protein studies obtained on 01/27/2020 were reviewed and discussed with the patient today including differential diagnosis.  Her quantitative immunoglobulins are all normal including normal IgG, IgM and IgA level.  She has a normal CBC with a creatinine clearance around 35 cc/min which is unchanged.  This likely represents a reactive abnormal SPEP versus MGUS.  I do not see evidence to suggest  endorgan damage or multiple myeloma.  Her monoclonal gammopathy is very faint and likely will not translate into any meaningful clinical plasma cell disorder.  I see no need for any further hematology evaluation beyond this testing at this time.  I do not think this will cause her any issues in the future.   2.  Right kidney masses: MRI was obtained on April 18 and that was reviewed today.  She has follow-up with urology regarding this issue.  He is appear to be likely benign etiology although risk of malignancy cannot be ruled out.   3.  Renal insufficiency: This does not appear to be related to plasma cell disorder.   4.  Follow-up: I am happy to see in the future as needed.   30  minutes were dedicated to this visit. The time was spent on reviewing laboratory data, imaging studies, discussing treatment options, discussing differential diagnosis and answering questions regarding future plan.    Zola Button, MD 6/1/202112:15 PM

## 2020-05-25 ENCOUNTER — Other Ambulatory Visit: Payer: Self-pay

## 2020-05-25 ENCOUNTER — Ambulatory Visit (INDEPENDENT_AMBULATORY_CARE_PROVIDER_SITE_OTHER): Payer: Medicare Other | Admitting: Ophthalmology

## 2020-05-25 ENCOUNTER — Encounter (INDEPENDENT_AMBULATORY_CARE_PROVIDER_SITE_OTHER): Payer: Self-pay | Admitting: Ophthalmology

## 2020-05-25 DIAGNOSIS — H43813 Vitreous degeneration, bilateral: Secondary | ICD-10-CM

## 2020-05-25 DIAGNOSIS — E113393 Type 2 diabetes mellitus with moderate nonproliferative diabetic retinopathy without macular edema, bilateral: Secondary | ICD-10-CM | POA: Diagnosis not present

## 2020-05-25 DIAGNOSIS — H31009 Unspecified chorioretinal scars, unspecified eye: Secondary | ICD-10-CM | POA: Insufficient documentation

## 2020-05-25 DIAGNOSIS — H35372 Puckering of macula, left eye: Secondary | ICD-10-CM | POA: Diagnosis not present

## 2020-05-25 DIAGNOSIS — H31003 Unspecified chorioretinal scars, bilateral: Secondary | ICD-10-CM | POA: Diagnosis not present

## 2020-05-25 NOTE — Assessment & Plan Note (Signed)

## 2020-05-25 NOTE — Progress Notes (Signed)
05/25/2020     CHIEF COMPLAINT Patient presents for Retina Follow Up   HISTORY OF PRESENT ILLNESS: Jeanne [name] is a 84 y.o. female who presents to the clinic today for:   HPI    Retina Follow Up    Patient presents with  Diabetic Retinopathy.  In both eyes.  This started 1.5 years ago.  Severity is mild.  Duration of 1.5 years.  Since onset it is gradually worsening.          Comments    1.5 Years Diabetic F/U OU  Pt c/o difficulty seeing TV OU. Pt sts she is still able to read OU. Pt c/o redness OU. A1c: pt does not recall LBS: 111 this AM       Last edited by Rockie Neighbours, Silas on 05/25/2020  9:15 AM. (History)      Referring physician: Seward Carol, MD [address] [place],  [place] [address]  HISTORICAL INFORMATION:   Selected notes from the MEDICAL RECORD NUMBER    Lab Results  Component Value Date   HGBA1C 6.6 (H) 10/09/2015     CURRENT MEDICATIONS: Current Outpatient Medications (Ophthalmic Drugs)  Medication Sig  . latanoprost (XALATAN) 0.005 % ophthalmic solution Place 1 drop into the left eye at bedtime.  . Polyvinyl Alcohol-Povidone (REFRESH OP) Place 1 drop into both eyes at bedtime.   No current facility-administered medications for this visit. (Ophthalmic Drugs)   Current Outpatient Medications (Other)  Medication Sig  . albuterol (PROVENTIL HFA;VENTOLIN HFA) 108 (90 Base) MCG/ACT inhaler Inhale 2 puffs into the lungs every 4 (four) hours as needed for wheezing or shortness of breath. (Patient not taking: Reported on 11/12/2019)  . aspirin EC 81 MG tablet Take 81 mg by mouth daily.    . Cholecalciferol (VITAMIN D3) 50 MCG (2000 UT) TABS Take 2,000 Units by mouth daily.   Marland Kitchen diltiazem (TIAZAC) 120 MG 24 hr capsule Take 120 mg by mouth in the morning and at bedtime.   Marland Kitchen diltiazem (TIAZAC) 240 MG 24 hr capsule Take 1 capsule (240 mg total) by mouth daily. (Patient not taking: Reported on 11/12/2019)  . ezetimibe (ZETIA) 10 MG  tablet Take 10 mg by mouth daily.   . ferrous sulfate 325 (65 FE) MG tablet Take 325 mg by mouth daily with breakfast.   . furosemide (LASIX) 20 MG tablet TAKE 1 TABLET BY MOUTH EVERY DAY  . hydrALAZINE (APRESOLINE) 25 MG tablet Take 1 tablet (25 mg total) by mouth 3 (three) times daily. (Patient taking differently: Take 25 mg by mouth daily. )  . hydrochlorothiazide (HYDRODIURIL) 50 MG tablet Take 50 mg by mouth daily.  . insulin aspart (NOVOLOG) 100 UNIT/ML injection 0-9 Units, Subcutaneous, 3 times daily with meals CBG < 70: Implement Hypoglycemia measures/Call MD CBG 70 - 120: 0 units CBG 121 - 150: 1 unit CBG 151 - 200: 2 units CBG 201 - 250: 3 units CBG 251 - 300: 5 units CBG 301 - 350: 7 units CBG 351 - 400: 9 units CBG > 400: call MD (Patient not taking: Reported on 11/12/2019)  . insulin glargine (LANTUS) 100 UNIT/ML injection Inject 65 Units into the skin daily before breakfast.   . losartan (COZAAR) 25 MG tablet Take 50 mg by mouth daily.   . Menthol, Topical Analgesic, (BIOFREEZE EX) Apply 1 application topically daily as needed (to affected areas for pain).   . nitroGLYCERIN (NITROSTAT) 0.4 MG SL tablet Place 1 tablet (0.4 mg total)  under the tongue as needed for chest pain.  Marland Kitchen oxybutynin (DITROPAN-XL) 10 MG 24 hr tablet Take 10 mg by mouth daily.    . pantoprazole (PROTONIX) 40 MG tablet Take 40 mg by mouth daily before breakfast.   . pravastatin (PRAVACHOL) 40 MG tablet Take 40 mg by mouth daily.    No current facility-administered medications for this visit. (Other)      REVIEW OF SYSTEMS:    ALLERGIES Allergies  Allergen Reactions  . Amoxicillin Nausea Only    Has patient had a PCN reaction causing immediate rash, facial/tongue/throat swelling, SOB or lightheadedness with hypotension: "yes, i was dizzy" Has patient had a PCN reaction causing severe rash involving mucus membranes or skin necrosis: no Did a PCN reaction that required hospitalization : no, called  the DR. Did PCN reaction occurring within the last 10 years: unk if all of the above answers are "NO", then may proceed with Cephalosporin use.   [name] [Canagliflozin] Nausea Only and Other (See Comments)    Dizziness     PAST MEDICAL HISTORY Past Medical History:  Diagnosis Date  . Anemia   . Arthritis   . Carotid arterial disease ([place])   . COVID-19   . Diabetes mellitus   . Hyperlipidemia   . Hypertension   . Migraine   . Obesity   . Reflux   . Sleep apnea    Past Surgical History:  Procedure Laterality Date  . CARDIAC CATHETERIZATION N/A 10/12/2015   Procedure: Left Heart Cath and Coronary Angiography;  Surgeon: Belva Crome, MD;  Location: [facility];  Service: Cardiovascular;  Laterality: N/A;  . CHOLECYSTECTOMY  50 yrs ago  . JOINT REPLACEMENT Right 2005   knee  . WISDOM TOOTH EXTRACTION      FAMILY HISTORY Family History  Problem Relation Age of Onset  . Heart attack Father   . Heart attack Mother     SOCIAL HISTORY Social History   Tobacco Use  . Smoking status: Never Smoker  . Smokeless tobacco: Never Used  Vaping Use  . Vaping Use: Never used  Substance Use Topics  . Alcohol use: Never  . Drug use: No         OPHTHALMIC EXAM:  Base Eye Exam    Visual Acuity (ETDRS)      Right Left   Dist cc 20/25 +2 20/30 -1   Dist ph cc  NI   Correction: Glasses       Tonometry (Tonopen, 9:17 AM)      Right Left   Pressure 15 20       Pupils      Pupils Dark Light Shape React APD   Right PERRL 3 2 Round Brisk None   Left PERRL 3 2 Round Brisk None       Visual Fields (Counting fingers)      Left Right    Full Full       Extraocular Movement      Right Left    Full Full       Neuro/Psych    Oriented x3: Yes   Mood/Affect: Normal       Dilation    Both eyes: 1.0% Mydriacyl, 2.5% Phenylephrine @ 9:20 AM        Slit Lamp and Fundus Exam    Slit Lamp Exam      Right Left   Lens Posterior chamber intraocular lens  Posterior chamber intraocular lens  IMAGING AND PROCEDURES  Imaging and Procedures for 05/25/20  Color Fundus Photography Optos - OU - Both Eyes       Right Eye Progression has been stable. Disc findings include normal observations.   Left Eye Progression has been stable. Disc findings include normal observations.   Notes OD, stable findings with moderate nonproliferative diabetic retinopathy.  Good focal laser photocoagulation temporally OD  OS similar with moderate nonproliferative diabetic retinopathy                ASSESSMENT/PLAN:  Moderate nonproliferative diabetic retinopathy of both eyes (HCC) The nature of moderate nonproliferative diabetic retinopathy was discussed with the patient as well as the need for more frequent follow up to judge for progression. Good blood glucose, blood pressure, and serum lipid control was recommended as well as avoidance of smoking and maintenance of normal weight.  Close follow up with PCP encouraged.  Posterior vitreous detachment of both eyes   The nature of posterior vitreous detachment was discussed with the patient as well as its physiology, its age prevalence, and its possible implication regarding retinal breaks and detachment.  An informational brochure was given to the patient.  All the patient's questions were answered.  The patient was asked to return if new or different flashes or floaters develops.   Patient was instructed to contact office immediately if any changes were noticed. I explained to the patient that vitreous inside the eye is similar to jello inside a bowl. As the jello melts it can start to pull away from the bowl, similarly the vitreous throughout our lives can begin to pull away from the retina. That process is called a posterior vitreous detachment. In some cases, the vitreous can tug hard enough on the retina to form a retinal tear. I discussed with the patient the signs and symptoms of a retinal  detachment.  Do not rub the eye.  Left epiretinal membrane The nature of macular pucker (epiretinal membrane ERM) was discussed with the patient as well as threshold criteria for vitrectomy surgery. I explained that in rare cases another surgery is needed to actually remove a second wrinkle should it regrow.  Most often, the epiretinal membrane and underlying wrinkled internal limiting membrane are removed with the first surgery, to accomplish the goals.   If the operative eye is Phakic (natural lens still present), cataract surgery is often recommended prior to Vitrectomy. This will enable the retina surgeon to have the best view during surgery and the patient to obtain optimal results in the future. Treatment options were discussed.  OS, minor in the past, no visual impact at this time.      ICD-10-CM   1. Moderate nonproliferative diabetic retinopathy of both eyes without macular edema associated with type 2 diabetes mellitus (HCC)  H41.9379 Color Fundus Photography Optos - OU - Both Eyes  2. Chorioretinal scar of both eyes  H31.003   3. Left epiretinal membrane  H35.372   4. Posterior vitreous detachment of both eyes  H43.813     1.  2.  3.  Ophthalmic Meds Ordered this visit:  No orders of the defined types were placed in this encounter.      Return in about 1 year (around 05/25/2021) for DILATE OU, OCT, COLOR FP.  Patient Instructions  Understands the critical importance of continued blood sugar control monitoring.  Patient to report any new onset visual acuity decline.    Explained the diagnoses, plan, and follow up with the patient and they  expressed understanding.  Patient expressed understanding of the importance of proper follow up care.   Clent Demark Zeyna Mkrtchyan M.D. Diseases & Surgery of the Retina and Vitreous [facility] [place] 05/25/20     Abbreviations: M myopia (nearsighted); A astigmatism; H hyperopia (farsighted); P presbyopia; Mrx spectacle  prescription;  CTL contact lenses; OD right eye; OS left eye; OU both eyes  XT exotropia; ET esotropia; PEK punctate epithelial keratitis; PEE punctate epithelial erosions; DES dry eye syndrome; MGD meibomian gland dysfunction; ATs artificial tears; PFAT's preservative free artificial tears; [place] nuclear sclerotic cataract; PSC posterior subcapsular cataract; ERM epi-retinal membrane; PVD posterior vitreous detachment; RD retinal detachment; DM diabetes mellitus; DR diabetic retinopathy; NPDR non-proliferative diabetic retinopathy; PDR proliferative diabetic retinopathy; CSME clinically significant macular edema; DME diabetic macular edema; dbh dot blot hemorrhages; CWS cotton wool spot; POAG primary open angle glaucoma; C/D cup-to-disc ratio; HVF humphrey visual field; GVF goldmann visual field; OCT optical coherence tomography; IOP intraocular pressure; BRVO Branch retinal vein occlusion; CRVO central retinal vein occlusion; CRAO central retinal artery occlusion; BRAO branch retinal artery occlusion; RT retinal tear; SB scleral buckle; PPV pars plana vitrectomy; VH Vitreous hemorrhage; PRP panretinal laser photocoagulation; IVK intravitreal kenalog; VMT vitreomacular traction; MH Macular hole;  NVD neovascularization of the disc; NVE neovascularization elsewhere; AREDS age related eye disease study; ARMD age related macular degeneration; POAG primary open angle glaucoma; EBMD epithelial/anterior basement membrane dystrophy; ACIOL anterior chamber intraocular lens; IOL intraocular lens; PCIOL posterior chamber intraocular lens; Phaco/IOL phacoemulsification with intraocular lens placement; [place] photorefractive keratectomy; LASIK laser assisted in situ keratomileusis; HTN hypertension; DM diabetes mellitus; COPD chronic obstructive pulmonary disease

## 2020-05-25 NOTE — Assessment & Plan Note (Signed)
The nature of moderate nonproliferative diabetic retinopathy was discussed with the patient as well as the need for more frequent follow up to judge for progression. Good blood glucose, blood pressure, and serum lipid control was recommended as well as avoidance of smoking and maintenance of normal weight.  Close follow up with PCP encouraged. °

## 2020-05-25 NOTE — Patient Instructions (Addendum)
Understands the critical importance of continued blood sugar control monitoring.  Patient to report any new onset visual acuity decline. Diabetic Retinopathy Diabetic retinopathy is a disease of the retina. The retina is a light-sensitive membrane at the back of the eye. Retinopathy is a complication of diabetes (diabetes mellitus) and a common cause of bad eyesight (visual impairment). It can eventually cause blindness. Early detection and treatment of diabetic retinopathy is important in keeping your eyes healthy and preventing further damage to them. What are the causes? Diabetic retinopathy is caused by blood sugar (glucose) levels that are too high for an extended period of time. High blood glucose over an extended period of time can:  Damage small blood vessels in the retina, allowing blood to leak through the vessel walls.  Cause new, abnormal blood vessels to grow on the retina. This can scar the retina in the advanced stage of diabetic retinopathy. What increases the risk? You are more likely to develop this condition if:  You have had diabetes for a long time.  You have poorly controlled blood glucose.  You have high blood pressure. What are the signs or symptoms? In the early stages of diabetic retinopathy, there are often no symptoms. As the condition gets worse, symptoms may include:  Blurred vision. This is usually caused by swelling due to abnormal blood glucose levels. The blurriness may go away when blood glucose levels return to normal.  Moving specks or dark spots (floaters) in your vision. These can be caused by a small amount of bleeding (hemorrhage) from retinal blood vessels.  Missing parts of your field of vision, such as vision at the sides of the eyes. This can be caused by larger retinal hemorrhages.  Difficulty reading.  Double vision.  Pain in one or both eyes.  Feeling pressure in one or both eyes.  Trouble seeing straight lines. Straight lines may not  look straight.  Redness of the eyes that does not go away. How is this diagnosed? This condition may be diagnosed with an eye exam in which your eye care specialist puts drops in your eyes that enlarge (dilate) your pupils. This lets your health care provider examine your retina and check for changes in your retinal blood vessels. How is this treated? This condition may be treated by:  Keeping your blood glucose and blood pressure within a target range.  Using a type of laser beam to seal your retinal blood vessels. This stops them from bleeding and decreases pressure in your eye.  Getting shots of medicine in the eye to reduce swelling of the center of the retina (macula). You may be given: ? Anti-VEGF medicine. This medicine can help slow vision loss, and may even improve vision. ? Steroid medicine. Follow these instructions at home:   Follow your diabetes management plan as directed by your health care provider. This may include exercising regularly and eating a healthy diet.  Keep your blood glucose level and your blood pressure in your target range, as directed by your health care provider.  Check your blood glucose as often as directed.  Take over the counter and prescription medicines only as told by your health care provider. This includes insulin and oral diabetes medicine.  Get your eyes checked at least once every year. An eye specialist can usually see diabetic retinopathy developing long before it starts to cause problems. In many cases, it can be treated to prevent complications from occurring.  Do not use any products that contain nicotine or  tobacco, such as cigarettes and e-cigarettes. If you need help quitting, ask your health care provider.  Keep all follow-up visits as told by your health care provider. This is important. Contact a health care provider if:  You notice gradual blurring or other changes in your vision over time.  You notice that your glasses or  contact lenses do not make things look as sharp as they once did.  You have trouble reading or seeing details at a distance with either eye.  You notice a change in your vision or notice that parts of your field of vision appear missing or hazy.  You suddenly see moving specks or dark spots in the field of vision of either eye. Get help right away if:  You have sudden pain or pressure in one or both eyes.  You suddenly lose vision or a curtain or veil seems to come across your eyes.  You have a sudden burst of floaters in your vision. Summary  Diabetic retinopathy is a disease of the retina. The retina is a light-sensitive membrane at the back of the eye. Retinopathy is a complication of diabetes.  Get your eyes checked at least once every year. An eye specialist can usually see diabetic retinopathy developing long before it starts to cause problems. In many cases, it can be treated to prevent complications from occurring.  Keep your blood glucose and your blood pressure in target range. Follow your diabetes management plan as directed by your health care provider.  Protect your eyes. Wear sunglasses and eye protection when needed. This information is not intended to replace advice given to you by your health care provider. Make sure you discuss any questions you have with your health care provider. Document Revised: 10/03/2017 Document Reviewed: 09/25/2016 Elsevier Patient Education  2020 [employer].

## 2020-05-25 NOTE — Assessment & Plan Note (Signed)
The nature of macular pucker (epiretinal membrane ERM) was discussed with the patient as well as threshold criteria for vitrectomy surgery. I explained that in rare cases another surgery is needed to actually remove a second wrinkle should it regrow.  Most often, the epiretinal membrane and underlying wrinkled internal limiting membrane are removed with the first surgery, to accomplish the goals.   If the operative eye is Phakic (natural lens still present), cataract surgery is often recommended prior to Vitrectomy. This will enable the retina surgeon to have the best view during surgery and the patient to obtain optimal results in the future. Treatment options were discussed.  OS, minor in the past, no visual impact at this time.

## 2020-06-03 ENCOUNTER — Other Ambulatory Visit: Payer: Self-pay

## 2020-06-03 ENCOUNTER — Ambulatory Visit: Payer: Medicare Other | Admitting: Cardiology

## 2020-06-03 ENCOUNTER — Encounter: Payer: Self-pay | Admitting: Cardiology

## 2020-06-03 VITALS — BP 121/75 | HR 87 | Ht 62.0 in | Wt 313.0 lb

## 2020-06-03 DIAGNOSIS — I25118 Atherosclerotic heart disease of native coronary artery with other forms of angina pectoris: Secondary | ICD-10-CM

## 2020-06-03 MED ORDER — ISOSORBIDE MONONITRATE ER 30 MG PO TB24: 30.0000 mg | ORAL_TABLET | Freq: Every day | ORAL | 3 refills | Status: DC

## 2020-06-03 NOTE — Progress Notes (Signed)
Follow up visit  Subjective:   Jeanne [name], female    DOB: 1936/03/10, 84 y.o.   MRN: [id number]     HPI  Chief Complaint  Patient presents with  . Congestive Heart Failure  . Coronary Artery Disease    pt c/o diziness getting out oh her car  . Hypertension  . Follow-up  . Chest Pain  . Dizziness    84 year old African-American female with nonobstructive CAD, hypertension, hyperlipidemia, type 2 DM, OSA,  IgG kappa monoclonal gammopathy   Patient has recently had episode of retrosternal chest tightness, provoked by emotional stress. Pain improved with SL NTG. She denies any chest pain with minimal waling that she does-about 700 steps.    Current Outpatient Medications on File Prior to Visit  Medication Sig Dispense Refill  . allopurinol (ZYLOPRIM) 100 MG tablet Take 100 mg by mouth daily.    Marland Kitchen ezetimibe (ZETIA) 10 MG tablet Take 10 mg by mouth daily.     . ferrous sulfate 325 (65 FE) MG tablet Take 325 mg by mouth daily with breakfast.     . furosemide (LASIX) 20 MG tablet TAKE 1 TABLET BY MOUTH EVERY DAY 90 tablet 1  . hydrALAZINE (APRESOLINE) 25 MG tablet Take 1 tablet (25 mg total) by mouth 3 (three) times daily. (Patient taking differently: Take 25 mg by mouth daily. ) 270 tablet 3  . hydrochlorothiazide (HYDRODIURIL) 50 MG tablet Take 50 mg by mouth daily.    Marland Kitchen latanoprost (XALATAN) 0.005 % ophthalmic solution Place 1 drop into the left eye at bedtime.  5  . losartan (COZAAR) 25 MG tablet Take 50 mg by mouth daily.     . Menthol, Topical Analgesic, (BIOFREEZE EX) Apply 1 application topically daily as needed (to affected areas for pain).     . nitroGLYCERIN (NITROSTAT) 0.4 MG SL tablet Place 1 tablet (0.4 mg total) under the tongue as needed for chest pain. 25 tablet 3  . oxybutynin (DITROPAN-XL) 10 MG 24 hr tablet Take 10 mg by mouth daily.      . pantoprazole (PROTONIX) 40 MG tablet Take 40 mg by mouth daily before breakfast.     . Polyvinyl Alcohol-Povidone  (REFRESH OP) Place 1 drop into both eyes at bedtime.    . pravastatin (PRAVACHOL) 40 MG tablet Take 40 mg by mouth daily.     Marland Kitchen albuterol (PROVENTIL HFA;VENTOLIN HFA) 108 (90 Base) MCG/ACT inhaler Inhale 2 puffs into the lungs every 4 (four) hours as needed for wheezing or shortness of breath. (Patient not taking: Reported on 11/12/2019) 1 Inhaler 0  . aspirin EC 81 MG tablet Take 81 mg by mouth daily.      . Cholecalciferol (VITAMIN D3) 50 MCG (2000 UT) TABS Take 2,000 Units by mouth daily.     Marland Kitchen diltiazem (TIAZAC) 120 MG 24 hr capsule Take 120 mg by mouth in the morning and at bedtime.     Marland Kitchen diltiazem (TIAZAC) 240 MG 24 hr capsule Take 1 capsule (240 mg total) by mouth daily. (Patient not taking: Reported on 11/12/2019) 30 capsule 0  . insulin aspart (NOVOLOG) 100 UNIT/ML injection 0-9 Units, Subcutaneous, 3 times daily with meals CBG < 70: Implement Hypoglycemia measures/Call MD CBG 70 - 120: 0 units CBG 121 - 150: 1 unit CBG 151 - 200: 2 units CBG 201 - 250: 3 units CBG 251 - 300: 5 units CBG 301 - 350: 7 units CBG 351 - 400: 9 units CBG > 400: call MD (Patient  not taking: Reported on 11/12/2019) 10 mL 11  . insulin glargine (LANTUS) 100 UNIT/ML injection Inject 65 Units into the skin daily before breakfast.      No current facility-administered medications on file prior to visit.    Cardiovascular & other pertient studies:   EKG 06/03/2020: Sinus rhythm 82 bpm  Left anterior fascicular block Nonspecific T-abnormality   Coronary angiogram 2017: 1. Prox LAD to Mid LAD lesion, 50% stenosed. 2. Ost 1st Diag to 1st Diag lesion, 70% stenosed. 3. Ost 2nd Diag to 2nd Diag lesion, 75% stenosed. 4. Dist Cx lesion, 40% stenosed. 5. Prox RCA to Dist RCA lesion, 30% stenosed.    Mild to moderate obstructive coronary disease involving the mid LAD, the first and second diagonal branches, the distal circumflex, and diffusely throughout the mid RCA.  No clinically significant obstruction is  identified especially with reference to the perfusion abnormalities noted on radionuclide scintigraphy.  Previously documented normal left ventricular function. Normal left ventricular hemodynamics were identified with an end-diastolic pressure of 13 mmHg.   Recommendations:   Consider alternative explanations for the patient's chest discomfort  Disposition is per internal medicine.    Echocardiogram 04/01/2019: 1. The left ventricle has normal systolic function with an ejection  fraction of 60-65%. The cavity size was normal. There is mildly increased  left ventricular wall thickness. Left ventricular diastolic Doppler  parameters are consistent with impaired  relaxation. Elevated left ventricular end-diastolic pressure The E/e' is  >15. No evidence of left ventricular regional wall motion abnormalities.  2. The right ventricle has mildly reduced systolic function. The cavity  was moderately enlarged. There is no increase in right ventricular wall  thickness.  3. The aortic valve is grossly normal.  4. The aorta is normal in size and structure.   10/2018: Normal examination. No evidence of hemodynamically significant peripheral arterial disease.  Signed,  Recent labs: 01/27/2020: Glucose 250, BUN/Cr 22/1.63. EGFR 34. Na/K 140/3.5. Rest of the CMP normal H/H 12/37. MCV 85. Platelets 200   Review of Systems  Cardiovascular: Positive for chest pain. Negative for dyspnea on exertion, leg swelling, palpitations and syncope.         Vitals:   06/03/20 1552  BP: 121/75  Pulse: 87  SpO2: 96%    Body mass index is 57.25 kg/m. Filed Weights   06/03/20 1552  Weight: (!) 313 lb (142 kg)     Objective:   Physical Exam Vitals and nursing note reviewed.  Constitutional:      General: She is not in acute distress. Neck:     Vascular: No JVD.  Cardiovascular:     Rate and Rhythm: Normal rate and regular rhythm.     Heart sounds: Normal heart sounds. No  murmur heard.   Pulmonary:     Effort: Pulmonary effort is normal.     Breath sounds: Normal breath sounds. No wheezing or rales.           Assessment & Recommendations:   84 year old African-American female with nonobstructive CAD, hypertension, hyperlipidemia, type 2 DM, OSA,  IgG kappa monoclonal gammopathy   Chest pain: Atypical angina. Known mild nonobstructive CAD in 2017. Recommend Imdur 30 mg daily. Return in 4 weeks. If no improvement, could consider ischemia evaluation. Continue Aspirin 81 mg. Lipid well controlled with pravastatin 40 mg.    Nigel Mormon, MD Pager: [phone] Office: [phone]

## 2020-06-09 ENCOUNTER — Telehealth: Payer: Self-pay

## 2020-06-09 ENCOUNTER — Emergency Department (HOSPITAL_COMMUNITY): Payer: Medicare Other

## 2020-06-09 ENCOUNTER — Other Ambulatory Visit: Payer: Self-pay

## 2020-06-09 ENCOUNTER — Emergency Department (HOSPITAL_COMMUNITY)
Admission: EM | Admit: 2020-06-09 | Discharge: 2020-06-09 | Disposition: A | Discharge status: Home / Self Care | Payer: Medicare Other | Attending: Emergency Medicine | Admitting: Emergency Medicine

## 2020-06-09 ENCOUNTER — Encounter (HOSPITAL_COMMUNITY): Payer: Self-pay | Admitting: Emergency Medicine

## 2020-06-09 DIAGNOSIS — R0789 Other chest pain: Secondary | ICD-10-CM | POA: Diagnosis not present

## 2020-06-09 DIAGNOSIS — I2510 Atherosclerotic heart disease of native coronary artery without angina pectoris: Secondary | ICD-10-CM | POA: Diagnosis not present

## 2020-06-09 DIAGNOSIS — R0600 Dyspnea, unspecified: Secondary | ICD-10-CM | POA: Diagnosis not present

## 2020-06-09 DIAGNOSIS — R5383 Other fatigue: Secondary | ICD-10-CM | POA: Diagnosis not present

## 2020-06-09 DIAGNOSIS — Z7982 Long term (current) use of aspirin: Secondary | ICD-10-CM | POA: Diagnosis not present

## 2020-06-09 DIAGNOSIS — Z79899 Other long term (current) drug therapy: Secondary | ICD-10-CM | POA: Diagnosis not present

## 2020-06-09 DIAGNOSIS — Z794 Long term (current) use of insulin: Secondary | ICD-10-CM | POA: Insufficient documentation

## 2020-06-09 DIAGNOSIS — I1310 Hypertensive heart and chronic kidney disease without heart failure, with stage 1 through stage 4 chronic kidney disease, or unspecified chronic kidney disease: Secondary | ICD-10-CM | POA: Diagnosis not present

## 2020-06-09 DIAGNOSIS — N183 Chronic kidney disease, stage 3 unspecified: Secondary | ICD-10-CM | POA: Insufficient documentation

## 2020-06-09 DIAGNOSIS — Z96651 Presence of right artificial knee joint: Secondary | ICD-10-CM | POA: Diagnosis not present

## 2020-06-09 DIAGNOSIS — E876 Hypokalemia: Secondary | ICD-10-CM | POA: Diagnosis not present

## 2020-06-09 DIAGNOSIS — Z8616 Personal history of COVID-19: Secondary | ICD-10-CM | POA: Diagnosis not present

## 2020-06-09 DIAGNOSIS — E119 Type 2 diabetes mellitus without complications: Secondary | ICD-10-CM | POA: Insufficient documentation

## 2020-06-09 LAB — HEPATIC FUNCTION PANEL
ALT: 10 U/L (ref 0–44)
AST: 17 U/L (ref 15–41)
Albumin: 3.2 g/dL — ABNORMAL LOW (ref 3.5–5.0)
Alkaline Phosphatase: 58 U/L (ref 38–126)
Bilirubin, Direct: <0.1 mg/dL (ref 0.0–0.2)
Total Bilirubin: 0.6 mg/dL (ref 0.3–1.2)
Total Protein: 6.3 g/dL — ABNORMAL LOW (ref 6.5–8.1)

## 2020-06-09 LAB — CBC
HCT: 33.4 % — ABNORMAL LOW (ref 36.0–46.0)
Hemoglobin: 10.3 g/dL — ABNORMAL LOW (ref 12.0–15.0)
MCH: 26.8 pg (ref 26.0–34.0)
MCHC: 30.8 g/dL (ref 30.0–36.0)
MCV: 87 fL (ref 80.0–100.0)
Platelets: 241 10*3/uL (ref 150–400)
RBC: 3.84 MIL/uL — ABNORMAL LOW (ref 3.87–5.11)
RDW: 14.5 % (ref 11.5–15.5)
WBC: 7 10*3/uL (ref 4.0–10.5)
nRBC: 0 % (ref 0.0–0.2)

## 2020-06-09 LAB — TROPONIN I (HIGH SENSITIVITY)
Troponin I (High Sensitivity): 12 ng/L (ref <18)
Troponin I (High Sensitivity): 12 ng/L (ref <18)

## 2020-06-09 LAB — BASIC METABOLIC PANEL
Anion gap: 11 (ref 5–15)
BUN: 20 mg/dL (ref 8–23)
CO2: 34 mmol/L — ABNORMAL HIGH (ref 22–32)
Calcium: 9.2 mg/dL (ref 8.9–10.3)
Chloride: 95 mmol/L — ABNORMAL LOW (ref 98–111)
Creatinine, Ser: 1.79 mg/dL — ABNORMAL HIGH (ref 0.44–1.00)
GFR calc non Af Amer: 26 mL/min — ABNORMAL LOW (ref >60)
Glucose, Bld: 158 mg/dL — ABNORMAL HIGH (ref 70–99)
Potassium: 2.9 mmol/L — ABNORMAL LOW (ref 3.5–5.1)
Sodium: 140 mmol/L (ref 135–145)

## 2020-06-09 LAB — BRAIN NATRIURETIC PEPTIDE: B Natriuretic Peptide: 80 pg/mL (ref 0.0–100.0)

## 2020-06-09 MED ORDER — POTASSIUM CHLORIDE CRYS ER 20 MEQ PO TBCR: 20.0000 meq | EXTENDED_RELEASE_TABLET | Freq: Once | ORAL | Status: DC

## 2020-06-09 NOTE — Telephone Encounter (Signed)
Has the pain been constant since last night? Has she taken nitroglycerin?

## 2020-06-09 NOTE — ED Provider Notes (Signed)
Signout from Dr. Vanita Panda.  84 year old female complaining of some chest pain.  Cardiac wise has ruled out.  Get a noncontrast CT for mass noted on chest x-ray. Physical Exam  BP (!) 110/56   Pulse 65   Temp 98.4 F (36.9 C) (Oral)   Resp 16   SpO2 97%   Physical Exam  ED Course/Procedures     Procedures  MDM  plan is to follow-up on chest CT.  Disol per results of CT.  IMPRESSION:  No evidence of parenchymal nodule to correspond with the findings of  recent chest x-ray. The finding seen on recent chest x-ray are  related to tortuosity of the vasculature as well exuberant  degenerative changes in the thoracic spine and ribcage.    Small sliding-type hiatal hernia.    Mild thickening of the pericardium greater than that seen on the  prior exam which may represent a small effusion. The need for  further follow-up can be determined on a clinical basis.    Aortic Atherosclerosis (ICD10-I70.0).     We will review with patient and have follow-up with PCP.     Hayden Rasmussen, MD 06/10/20 1023

## 2020-06-09 NOTE — ED Triage Notes (Signed)
Pt arrives to ED with complaint of chest pain. Chest pain started yesterday and is now constant and dull. Pt states chest pain is generalized. States been more SOB when talking or walking.

## 2020-06-09 NOTE — Discharge Instructions (Signed)
You were seen in the emergency department for chest pain.  You had lab work EKG chest x-ray and a CAT scan of your chest that did not show an obvious explanation for your symptoms.  Your potassium was mildly low.  Please contact your primary care doctor for close follow-up.  There were some findings on the CAT scan that may need follow-up.  Included below is the report of your CAT scan.  IMPRESSION:  No evidence of parenchymal nodule to correspond with the findings of  recent chest x-ray. The finding seen on recent chest x-ray are  related to tortuosity of the vasculature as well exuberant  degenerative changes in the thoracic spine and ribcage.     Small sliding-type hiatal hernia.     Mild thickening of the pericardium greater than that seen on the  prior exam which may represent a small effusion. The need for  further follow-up can be determined on a clinical basis.     Aortic Atherosclerosis (ICD10-I70.0).

## 2020-06-09 NOTE — ED Provider Notes (Signed)
[facility] EMERGENCY DEPARTMENT Provider Note   CSN: [id number] Arrival date & time: 06/09/20  1108     History Chief Complaint  Patient presents with  . Chest Pain    Jeanne [name] is a 84 y.o. female.  HPI  Patient presents concern of chest pain. This episode began yesterday.  She notes that prior to this for the past week or so she has had difficulty with activity and ambulation, including dyspnea, and fatigue. However, till yesterday she had no similar chest pain. Now, without clear precipitant, she developed pain in her suprasternal region.  Pain is nonradiating, sore, persistent, not necessarily exertional or pleuritic. No fever, no nausea, vomiting, no other complaints.   Past Medical History:  Diagnosis Date  . Anemia   . Arthritis   . Carotid arterial disease ([place])   . COVID-19   . Diabetes mellitus   . Hyperlipidemia   . Hypertension   . Migraine   . Obesity   . Reflux   . Sleep apnea     Patient Active Problem List   Diagnosis Date Noted  . Moderate nonproliferative diabetic retinopathy of both eyes ([place]) 05/25/2020  . Chorioretinal scar 05/25/2020  . Left epiretinal membrane 05/25/2020  . Posterior vitreous detachment of both eyes 05/25/2020  . Acute respiratory failure with hypoxia ([place]) 10/11/2019  . CAP (community acquired pneumonia) 10/11/2019  . Diastolic dysfunction, left ventricle 04/05/2019  . Atherosclerosis of native coronary artery of native heart without angina pectoris 11/23/2018  . Hypercholesteremia 11/23/2018  . Unstable angina ([place]) 10/12/2015  . Abnormal myocardial perfusion study 10/12/2015  . NSVT (nonsustained ventricular tachycardia) ([place]) 10/11/2015  . Hypertension with heart disease 10/11/2015  . CKD (chronic kidney disease), stage III ([place]) 10/11/2015  . Pain in the chest   . Chest pain 10/08/2015  . Diabetes mellitus type II, non insulin dependent ([place]) 10/08/2015  . AKI (acute kidney injury) ([place])    . Abscess of left arm 01/01/2013  . Migraine   . Morbid obesity ([place])   . Sleep apnea   . Stenosis of left carotid artery   . Reflux   . Anemia     Past Surgical History:  Procedure Laterality Date  . CARDIAC CATHETERIZATION N/A 10/12/2015   Procedure: Left Heart Cath and Coronary Angiography;  Surgeon: Belva Crome, MD;  Location: [facility];  Service: Cardiovascular;  Laterality: N/A;  . CHOLECYSTECTOMY  50 yrs ago  . JOINT REPLACEMENT Right 2005   knee  . WISDOM TOOTH EXTRACTION       OB History   No obstetric history on file.     Family History  Problem Relation Age of Onset  . Heart attack Father   . Heart attack Mother     Social History   Tobacco Use  . Smoking status: Never Smoker  . Smokeless tobacco: Never Used  Vaping Use  . Vaping Use: Never used  Substance Use Topics  . Alcohol use: Never  . Drug use: No    Home Medications Prior to Admission medications   Medication Sig Start Date End Date Taking? Authorizing Provider  albuterol (PROVENTIL HFA;VENTOLIN HFA) 108 (90 Base) MCG/ACT inhaler Inhale 2 puffs into the lungs every 4 (four) hours as needed for wheezing or shortness of breath. Patient not taking: Reported on 11/12/2019 09/11/16   Melony Overly, MD  allopurinol (ZYLOPRIM) 100 MG tablet Take 100 mg by mouth daily. 05/27/20   [provider]  aspirin EC  81 MG tablet Take 81 mg by mouth daily.      [provider]  Cholecalciferol (VITAMIN D3) 50 MCG (2000 UT) TABS Take 2,000 Units by mouth daily.  07/30/19   [provider]  diltiazem (TIAZAC) 120 MG 24 hr capsule Take 120 mg by mouth in the morning and at bedtime.  09/08/19   [provider]  diltiazem (TIAZAC) 240 MG 24 hr capsule Take 1 capsule (240 mg total) by mouth daily. Patient not taking: Reported on 11/12/2019 04/07/19   Despina Hick, MD  ezetimibe (ZETIA) 10 MG tablet Take 10 mg by mouth daily.     [provider]  ferrous sulfate 325  (65 FE) MG tablet Take 325 mg by mouth daily with breakfast.  07/30/19   [provider]  furosemide (LASIX) 20 MG tablet TAKE 1 TABLET BY MOUTH EVERY DAY 12/22/19   Adrian Prows, MD  hydrALAZINE (APRESOLINE) 25 MG tablet Take 1 tablet (25 mg total) by mouth 3 (three) times daily. Patient taking differently: Take 25 mg by mouth daily.  04/28/19 10/10/28  Despina Hick, MD  hydrochlorothiazide (HYDRODIURIL) 50 MG tablet Take 50 mg by mouth daily. 08/24/19   [provider]  insulin aspart (NOVOLOG) 100 UNIT/ML injection 0-9 Units, Subcutaneous, 3 times daily with meals CBG < 70: Implement Hypoglycemia measures/Call MD CBG 70 - 120: 0 units CBG 121 - 150: 1 unit CBG 151 - 200: 2 units CBG 201 - 250: 3 units CBG 251 - 300: 5 units CBG 301 - 350: 7 units CBG 351 - 400: 9 units CBG > 400: call MD Patient not taking: Reported on 11/12/2019 10/17/19   Jonetta Osgood, MD  insulin glargine (LANTUS) 100 UNIT/ML injection Inject 65 Units into the skin daily before breakfast.     [provider]  isosorbide mononitrate (IMDUR) 30 MG 24 hr tablet Take 1 tablet (30 mg total) by mouth daily. 06/03/20 09/01/20  Patwardhan, Reynold Bowen, MD  latanoprost (XALATAN) 0.005 % ophthalmic solution Place 1 drop into the left eye at bedtime. 09/26/15   [provider]  losartan (COZAAR) 25 MG tablet Take 50 mg by mouth daily.  10/18/19   [provider]  Menthol, Topical Analgesic, (BIOFREEZE EX) Apply 1 application topically daily as needed (to affected areas for pain).     [provider]  nitroGLYCERIN (NITROSTAT) 0.4 MG SL tablet Place 1 tablet (0.4 mg total) under the tongue as needed for chest pain. 09/08/19   Miquel Dunn, NP  oxybutynin (DITROPAN-XL) 10 MG 24 hr tablet Take 10 mg by mouth daily.      [provider]  pantoprazole (PROTONIX) 40 MG tablet Take 40 mg by mouth daily before breakfast.  08/25/15   [provider]  Polyvinyl  Alcohol-Povidone (REFRESH OP) Place 1 drop into both eyes at bedtime.    [provider]  pravastatin (PRAVACHOL) 40 MG tablet Take 40 mg by mouth daily.     [provider]    Allergies    Amoxicillin and Invokana [canagliflozin]  Review of Systems   Review of Systems  Constitutional:       Per HPI, otherwise negative  HENT:       Per HPI, otherwise negative  Respiratory:       Per HPI, otherwise negative  Cardiovascular:       Per HPI, otherwise negative  Gastrointestinal: Negative for vomiting.  Endocrine:       Negative aside from  HPI  Genitourinary:       Neg aside from HPI   Musculoskeletal:       Per HPI, otherwise negative  Skin: Negative.   Neurological: Negative for syncope.    Physical Exam Updated Vital Signs BP (!) 102/50   Pulse 73   Temp 98.4 F (36.9 C) (Oral)   Resp 15   SpO2 98%   Physical Exam Vitals and nursing note reviewed.  Constitutional:      General: She is not in acute distress.    Appearance: She is well-developed. She is obese.  HENT:     Head: Normocephalic and atraumatic.  Eyes:     Conjunctiva/sclera: Conjunctivae normal.  Cardiovascular:     Rate and Rhythm: Normal rate and regular rhythm.  Pulmonary:     Effort: Pulmonary effort is normal. No respiratory distress.     Breath sounds: Normal breath sounds. No stridor.  Abdominal:     General: There is no distension.  Skin:    General: Skin is warm and dry.  Neurological:     Mental Status: She is alert and oriented to person, place, and time.     Cranial Nerves: No cranial nerve deficit.     ED Results / Procedures / Treatments   Labs (all labs ordered are listed, but only abnormal results are displayed) Labs Reviewed  BASIC METABOLIC PANEL - Abnormal; Notable for the following components:      Result Value   Potassium 2.9 (*)    Chloride 95 (*)    CO2 34 (*)    Glucose, Bld 158 (*)    Creatinine, Ser 1.79 (*)    GFR calc non Af Amer 26 (*)     All other components within normal limits  CBC - Abnormal; Notable for the following components:   RBC 3.84 (*)    Hemoglobin 10.3 (*)    HCT 33.4 (*)    All other components within normal limits  HEPATIC FUNCTION PANEL - Abnormal; Notable for the following components:   Total Protein 6.3 (*)    Albumin 3.2 (*)    All other components within normal limits  BRAIN NATRIURETIC PEPTIDE  TROPONIN I (HIGH SENSITIVITY)  TROPONIN I (HIGH SENSITIVITY)    EKG EKG Interpretation  Date/Time:  Wednesday June 09 2020 11:29:10 EDT Ventricular Rate:  82 PR Interval:  156 QRS Duration: 82 QT Interval:  386 QTC Calculation: 450 R Axis:   -85 Text Interpretation: Sinus rhythm with Premature supraventricular complexes Left axis deviation Anterior infarct , age undetermined Baseline wander Artifact Abnormal ECG Confirmed by Carmin Muskrat [phone]) on 06/09/2020 11:47:57 AM   Radiology DG Chest 2 View  Result Date: 06/09/2020 CLINICAL DATA:  Chest pain since yesterday, now constant and dull, shortness of breath with talking and walking EXAM: CHEST - 2 VIEW COMPARISON:  11/12/2019 FINDINGS: Normal heart size, mediastinal contours, and pulmonary vascularity. Atherosclerotic calcification aorta. Accentuation of medial RIGHT upper lobe markings unchanged. No acute infiltrate, pleural effusion or pneumothorax. Questionable nodular density 16 mm diameter RIGHT upper lobe. EKG lead projects over mid LEFT lung. Multilevel endplate spur formation thoracic spine. IMPRESSION: Chronic RIGHT upper lobe changes with question new 16 mm nodular density RIGHT upper lobe; further evaluation of the chest by CT imaging recommended. Electronically Signed   By: Lavonia Dana M.D.   On: 06/09/2020 11:46    Procedures Procedures (including critical care time)  Medications Ordered in ED Medications - No data to display  ED Course  I have reviewed the triage vital signs and the nursing notes.  Pertinent labs & imaging  results that were available during my care of the patient were reviewed by me and considered in my medical decision making (see chart for details).     3:34 PM Patient in no distress, awake, alert. Labs reviewed, notable for mild hypokalemia, persistently slightly elevated creatinine, but generally reassuring. Serial troponins have no change, BMP is normal, EKG nonischemic.  X-ray with possible nodule in the upper chest, and as this is right where her pain is, CT scan has been ordered.  Early female presents with new upper chest pain. Patient does have multiple medical issues, but her initial studies here including serial troponin, EKG, labs, all reassuring, no evidence for ACS, decompensated heart failure, pneumonia, however, the patient's x-ray was abnormal, prompting CT scan. Patient is awaiting this study on signout, with disposition pending. Dr. Melina Copa is aware of the patient.   Final Clinical Impression(s) / ED Diagnoses Final diagnoses:  Atypical chest pain     Carmin Muskrat, MD 06/09/20 [phone]

## 2020-06-09 NOTE — Telephone Encounter (Signed)
Telephone encounter:  Reason for call: Chest pain. Patient said it started again last night and it still hurting her today   Usual provider: MP  Last office visit: 06/03/20  Next office visit: 07/02/20   Last hospitalization: 11/16/19   Current Outpatient Medications on File Prior to Visit  Medication Sig Dispense Refill  . albuterol (PROVENTIL HFA;VENTOLIN HFA) 108 (90 Base) MCG/ACT inhaler Inhale 2 puffs into the lungs every 4 (four) hours as needed for wheezing or shortness of breath. (Patient not taking: Reported on 11/12/2019) 1 Inhaler 0  . allopurinol (ZYLOPRIM) 100 MG tablet Take 100 mg by mouth daily.    Marland Kitchen aspirin EC 81 MG tablet Take 81 mg by mouth daily.      . Cholecalciferol (VITAMIN D3) 50 MCG (2000 UT) TABS Take 2,000 Units by mouth daily.     Marland Kitchen diltiazem (TIAZAC) 120 MG 24 hr capsule Take 120 mg by mouth in the morning and at bedtime.     Marland Kitchen diltiazem (TIAZAC) 240 MG 24 hr capsule Take 1 capsule (240 mg total) by mouth daily. (Patient not taking: Reported on 11/12/2019) 30 capsule 0  . ezetimibe (ZETIA) 10 MG tablet Take 10 mg by mouth daily.     . ferrous sulfate 325 (65 FE) MG tablet Take 325 mg by mouth daily with breakfast.     . furosemide (LASIX) 20 MG tablet TAKE 1 TABLET BY MOUTH EVERY DAY 90 tablet 1  . hydrALAZINE (APRESOLINE) 25 MG tablet Take 1 tablet (25 mg total) by mouth 3 (three) times daily. (Patient taking differently: Take 25 mg by mouth daily. ) 270 tablet 3  . hydrochlorothiazide (HYDRODIURIL) 50 MG tablet Take 50 mg by mouth daily.    . insulin aspart (NOVOLOG) 100 UNIT/ML injection 0-9 Units, Subcutaneous, 3 times daily with meals CBG < 70: Implement Hypoglycemia measures/Call MD CBG 70 - 120: 0 units CBG 121 - 150: 1 unit CBG 151 - 200: 2 units CBG 201 - 250: 3 units CBG 251 - 300: 5 units CBG 301 - 350: 7 units CBG 351 - 400: 9 units CBG > 400: call MD (Patient not taking: Reported on 11/12/2019) 10 mL 11  . insulin glargine (LANTUS) 100 UNIT/ML  injection Inject 65 Units into the skin daily before breakfast.     . isosorbide mononitrate (IMDUR) 30 MG 24 hr tablet Take 1 tablet (30 mg total) by mouth daily. 30 tablet 3  . latanoprost (XALATAN) 0.005 % ophthalmic solution Place 1 drop into the left eye at bedtime.  5  . losartan (COZAAR) 25 MG tablet Take 50 mg by mouth daily.     . Menthol, Topical Analgesic, (BIOFREEZE EX) Apply 1 application topically daily as needed (to affected areas for pain).     . nitroGLYCERIN (NITROSTAT) 0.4 MG SL tablet Place 1 tablet (0.4 mg total) under the tongue as needed for chest pain. 25 tablet 3  . oxybutynin (DITROPAN-XL) 10 MG 24 hr tablet Take 10 mg by mouth daily.      . pantoprazole (PROTONIX) 40 MG tablet Take 40 mg by mouth daily before breakfast.     . Polyvinyl Alcohol-Povidone (REFRESH OP) Place 1 drop into both eyes at bedtime.    . pravastatin (PRAVACHOL) 40 MG tablet Take 40 mg by mouth daily.      No current facility-administered medications on file prior to visit.

## 2020-06-09 NOTE — ED Notes (Signed)
Pt in xray, notified xray to bring pt to room when finished. Transport will bring pt back to room.

## 2020-07-02 ENCOUNTER — Ambulatory Visit: Payer: Medicare Other | Admitting: Cardiology

## 2020-07-08 ENCOUNTER — Ambulatory Visit: Payer: Medicare Other | Admitting: Cardiology

## 2020-07-08 ENCOUNTER — Other Ambulatory Visit: Payer: Self-pay

## 2020-07-08 ENCOUNTER — Encounter: Payer: Self-pay | Admitting: Cardiology

## 2020-07-08 VITALS — BP 140/74 | HR 86 | Resp 16 | Ht 62.0 in | Wt 311.0 lb

## 2020-07-08 DIAGNOSIS — I25118 Atherosclerotic heart disease of native coronary artery with other forms of angina pectoris: Secondary | ICD-10-CM

## 2020-07-08 MED ORDER — ISOSORBIDE MONONITRATE ER 30 MG PO TB24: 30.0000 mg | ORAL_TABLET | Freq: Every day | ORAL | 3 refills | Status: DC

## 2020-07-08 NOTE — Progress Notes (Signed)
Follow up visit  Subjective:   Jeanne [name], female    DOB: 1936-04-08, 84 y.o.   MRN: [id number]     HPI   Chief Complaint  Patient presents with  . Atherosclerosis of native coronary artery of native heart wi  . Follow-up    4 week     84 year old African-American female with nonobstructive CAD, hypertension, hyperlipidemia, type 2 DM, OSA,  IgG kappa monoclonal gammopathy   Patient has recently had episode of retrosternal chest tightness, provoked by emotional stress. Pain improved with SL NTG. She denies any chest pain with minimal waling that she does-about 700 steps.  Started on Imdur 30 mg daily.  Subsequently, patient was seen in [facility] ED on 06/09/2020 with complaints of chest pain.  ACS was ruled out.  Chest x-ray was abnormal that prompted CT scan.  CT scan ruled out evidence of any parenchymal nodule to correspond with chest x-ray.  The findings on the chest x-ray were thought to be likely related to tortuosity of the vasculature as well as exuberant degenerative changes in the thoracic spine and rib cage.  Small sliding type hiatal hernia was seen.  Mild thickening of pericardium greater than that seen on prior exam was thought to represent small pericardial effusion.  Aortic atherosclerosis also seen.  Patient is here for follow-up visit.  She continues to have episodes of chest tightness related to emotional stress.  She has minimal walking at home.  With this activity, she does not have any chest pain.    Current Outpatient Medications on File Prior to Visit  Medication Sig Dispense Refill  . albuterol (PROVENTIL HFA;VENTOLIN HFA) 108 (90 Base) MCG/ACT inhaler Inhale 2 puffs into the lungs every 4 (four) hours as needed for wheezing or shortness of breath. (Patient not taking: Reported on 11/12/2019) 1 Inhaler 0  . allopurinol (ZYLOPRIM) 100 MG tablet Take 100 mg by mouth daily.    Marland Kitchen aspirin EC 81 MG tablet Take 81 mg by mouth daily.      . Cholecalciferol  (VITAMIN D3) 50 MCG (2000 UT) TABS Take 2,000 Units by mouth daily.     Marland Kitchen diltiazem (TIAZAC) 120 MG 24 hr capsule Take 120 mg by mouth in the morning and at bedtime.     Marland Kitchen diltiazem (TIAZAC) 240 MG 24 hr capsule Take 1 capsule (240 mg total) by mouth daily. (Patient not taking: Reported on 11/12/2019) 30 capsule 0  . ezetimibe (ZETIA) 10 MG tablet Take 10 mg by mouth daily.     . ferrous sulfate 325 (65 FE) MG tablet Take 325 mg by mouth daily with breakfast.     . furosemide (LASIX) 20 MG tablet TAKE 1 TABLET BY MOUTH EVERY DAY 90 tablet 1  . hydrALAZINE (APRESOLINE) 25 MG tablet Take 1 tablet (25 mg total) by mouth 3 (three) times daily. (Patient taking differently: Take 25 mg by mouth daily. ) 270 tablet 3  . hydrochlorothiazide (HYDRODIURIL) 50 MG tablet Take 50 mg by mouth daily.    . insulin aspart (NOVOLOG) 100 UNIT/ML injection 0-9 Units, Subcutaneous, 3 times daily with meals CBG < 70: Implement Hypoglycemia measures/Call MD CBG 70 - 120: 0 units CBG 121 - 150: 1 unit CBG 151 - 200: 2 units CBG 201 - 250: 3 units CBG 251 - 300: 5 units CBG 301 - 350: 7 units CBG 351 - 400: 9 units CBG > 400: call MD (Patient not taking: Reported on 11/12/2019) 10 mL 11  . insulin  glargine (LANTUS) 100 UNIT/ML injection Inject 65 Units into the skin daily before breakfast.     . isosorbide mononitrate (IMDUR) 30 MG 24 hr tablet Take 1 tablet (30 mg total) by mouth daily. 30 tablet 3  . latanoprost (XALATAN) 0.005 % ophthalmic solution Place 1 drop into the left eye at bedtime.  5  . losartan (COZAAR) 25 MG tablet Take 50 mg by mouth daily.     . Menthol, Topical Analgesic, (BIOFREEZE EX) Apply 1 application topically daily as needed (to affected areas for pain).     . nitroGLYCERIN (NITROSTAT) 0.4 MG SL tablet Place 1 tablet (0.4 mg total) under the tongue as needed for chest pain. 25 tablet 3  . oxybutynin (DITROPAN-XL) 10 MG 24 hr tablet Take 10 mg by mouth daily.      . pantoprazole (PROTONIX) 40 MG  tablet Take 40 mg by mouth daily before breakfast.     . Polyvinyl Alcohol-Povidone (REFRESH OP) Place 1 drop into both eyes at bedtime.    . pravastatin (PRAVACHOL) 40 MG tablet Take 40 mg by mouth daily.      No current facility-administered medications on file prior to visit.    Cardiovascular & other pertient studies:   EKG 06/03/2020: Sinus rhythm 82 bpm  Left anterior fascicular block Nonspecific T-abnormality   Coronary angiogram 2017: 1. Prox LAD to Mid LAD lesion, 50% stenosed. 2. Ost 1st Diag to 1st Diag lesion, 70% stenosed. 3. Ost 2nd Diag to 2nd Diag lesion, 75% stenosed. 4. Dist Cx lesion, 40% stenosed. 5. Prox RCA to Dist RCA lesion, 30% stenosed.    Mild to moderate obstructive coronary disease involving the mid LAD, the first and second diagonal branches, the distal circumflex, and diffusely throughout the mid RCA.  No clinically significant obstruction is identified especially with reference to the perfusion abnormalities noted on radionuclide scintigraphy.  Previously documented normal left ventricular function. Normal left ventricular hemodynamics were identified with an end-diastolic pressure of 13 mmHg.   Recommendations:   Consider alternative explanations for the patient's chest discomfort  Disposition is per internal medicine.    Echocardiogram 04/01/2019: 1. The left ventricle has normal systolic function with an ejection  fraction of 60-65%. The cavity size was normal. There is mildly increased  left ventricular wall thickness. Left ventricular diastolic Doppler  parameters are consistent with impaired  relaxation. Elevated left ventricular end-diastolic pressure The E/e' is  >15. No evidence of left ventricular regional wall motion abnormalities.  2. The right ventricle has mildly reduced systolic function. The cavity  was moderately enlarged. There is no increase in right ventricular wall  thickness.  3. The aortic valve is grossly  normal.  4. The aorta is normal in size and structure.   10/2018: Normal examination. No evidence of hemodynamically significant peripheral arterial disease.  Signed,  Recent labs: 06/09/2020: Glucose 158, BUN/Cr 20/1.79. EGFR N/A. Na/K 140/2.9. Rest of the CMP normal H/H 10/33. MCV 87. Platelets 247  01/27/2020: Glucose 250, BUN/Cr 22/1.63. EGFR 34. Na/K 140/3.5. Rest of the CMP normal H/H 12/37. MCV 85. Platelets 200  03/30/2020: Chol 92, TG 77, HDL 32, LDL 45   Review of Systems  Cardiovascular: Positive for chest pain. Negative for dyspnea on exertion, leg swelling, palpitations and syncope.        ** Vitals:   07/08/20 1156  BP: 140/74  Pulse: 86  Resp: 16  SpO2: 97%    ** Body mass index is 56.88 kg/m. Filed Weights   07/08/20 1156  Weight: (!) 311 lb (141.1 kg)     Objective:   Physical Exam Vitals and nursing note reviewed.  Constitutional:      General: She is not in acute distress. Neck:     Vascular: No JVD.  Cardiovascular:     Rate and Rhythm: Normal rate and regular rhythm.     Heart sounds: Normal heart sounds. No murmur heard.   Pulmonary:     Effort: Pulmonary effort is normal.     Breath sounds: Normal breath sounds. No wheezing or rales.           Assessment & Recommendations:   84 year old African-American female with nonobstructive CAD, hypertension, hyperlipidemia, type 2 DM, OSA,  IgG kappa monoclonal gammopathy   Chest pain: Atypical angina. Known mild nonobstructive CAD in 2017. Currently taking Imdur 30 mg daily, diltiazem 360 mg daily.  She is unsure if she is noticing improvement.  We discussed her options, including stress test, CT angiogram, pulmonary angiography.  She is unsure about proceeding with any of these.  She will continue current medical therapy and see me back in 4 weeks. Continue Aspirin 81 mg. Lipid well controlled with pravastatin 40 mg.    Nigel Mormon, MD Pager: [phone] Office:  [phone]

## 2020-07-12 ENCOUNTER — Other Ambulatory Visit: Payer: Self-pay | Admitting: Cardiology

## 2020-07-12 DIAGNOSIS — I519 Heart disease, unspecified: Secondary | ICD-10-CM

## 2020-07-12 DIAGNOSIS — I5032 Chronic diastolic (congestive) heart failure: Secondary | ICD-10-CM

## 2020-07-12 DIAGNOSIS — I119 Hypertensive heart disease without heart failure: Secondary | ICD-10-CM

## 2020-07-16 ENCOUNTER — Other Ambulatory Visit: Payer: Self-pay | Admitting: Cardiology

## 2020-07-16 DIAGNOSIS — I25118 Atherosclerotic heart disease of native coronary artery with other forms of angina pectoris: Secondary | ICD-10-CM

## 2020-08-13 ENCOUNTER — Other Ambulatory Visit: Payer: Self-pay | Admitting: Cardiology

## 2020-08-30 ENCOUNTER — Encounter (HOSPITAL_COMMUNITY): Payer: Self-pay | Admitting: Emergency Medicine

## 2020-08-30 ENCOUNTER — Other Ambulatory Visit: Payer: Self-pay

## 2020-08-30 ENCOUNTER — Ambulatory Visit (HOSPITAL_COMMUNITY)
Admission: EM | Admit: 2020-08-30 | Discharge: 2020-08-30 | Disposition: A | Discharge status: Home / Self Care | Payer: Medicare Other

## 2020-08-30 ENCOUNTER — Encounter (HOSPITAL_COMMUNITY): Payer: Self-pay

## 2020-08-30 ENCOUNTER — Emergency Department (HOSPITAL_COMMUNITY)
Admission: EM | Admit: 2020-08-30 | Discharge: 2020-08-31 | Disposition: A | Discharge status: Home / Self Care | Payer: Medicare Other | Attending: Emergency Medicine | Admitting: Emergency Medicine

## 2020-08-30 DIAGNOSIS — I509 Heart failure, unspecified: Secondary | ICD-10-CM | POA: Diagnosis not present

## 2020-08-30 DIAGNOSIS — Z794 Long term (current) use of insulin: Secondary | ICD-10-CM | POA: Diagnosis not present

## 2020-08-30 DIAGNOSIS — N183 Chronic kidney disease, stage 3 unspecified: Secondary | ICD-10-CM | POA: Insufficient documentation

## 2020-08-30 DIAGNOSIS — I130 Hypertensive heart and chronic kidney disease with heart failure and stage 1 through stage 4 chronic kidney disease, or unspecified chronic kidney disease: Secondary | ICD-10-CM | POA: Diagnosis not present

## 2020-08-30 DIAGNOSIS — R42 Dizziness and giddiness: Secondary | ICD-10-CM | POA: Insufficient documentation

## 2020-08-30 DIAGNOSIS — E11319 Type 2 diabetes mellitus with unspecified diabetic retinopathy without macular edema: Secondary | ICD-10-CM | POA: Diagnosis not present

## 2020-08-30 DIAGNOSIS — Z955 Presence of coronary angioplasty implant and graft: Secondary | ICD-10-CM | POA: Diagnosis not present

## 2020-08-30 DIAGNOSIS — Z79899 Other long term (current) drug therapy: Secondary | ICD-10-CM | POA: Insufficient documentation

## 2020-08-30 DIAGNOSIS — Z7982 Long term (current) use of aspirin: Secondary | ICD-10-CM | POA: Diagnosis not present

## 2020-08-30 DIAGNOSIS — Z96659 Presence of unspecified artificial knee joint: Secondary | ICD-10-CM | POA: Diagnosis not present

## 2020-08-30 DIAGNOSIS — E1122 Type 2 diabetes mellitus with diabetic chronic kidney disease: Secondary | ICD-10-CM | POA: Diagnosis not present

## 2020-08-30 DIAGNOSIS — Z8616 Personal history of COVID-19: Secondary | ICD-10-CM | POA: Insufficient documentation

## 2020-08-30 LAB — URINALYSIS, ROUTINE W REFLEX MICROSCOPIC
Bilirubin Urine: NEGATIVE
Glucose, UA: NEGATIVE mg/dL
Hgb urine dipstick: NEGATIVE
Ketones, ur: NEGATIVE mg/dL
Leukocytes,Ua: NEGATIVE
Nitrite: NEGATIVE
Protein, ur: NEGATIVE mg/dL
Specific Gravity, Urine: 1.017 (ref 1.005–1.030)
pH: 5 (ref 5.0–8.0)

## 2020-08-30 LAB — CBC
HCT: 36 % (ref 36.0–46.0)
Hemoglobin: 11.5 g/dL — ABNORMAL LOW (ref 12.0–15.0)
MCH: 27.7 pg (ref 26.0–34.0)
MCHC: 31.9 g/dL (ref 30.0–36.0)
MCV: 86.7 fL (ref 80.0–100.0)
Platelets: 255 10*3/uL (ref 150–400)
RBC: 4.15 MIL/uL (ref 3.87–5.11)
RDW: 14.7 % (ref 11.5–15.5)
WBC: 6.7 10*3/uL (ref 4.0–10.5)
nRBC: 0 % (ref 0.0–0.2)

## 2020-08-30 LAB — CBG MONITORING, ED: Glucose-Capillary: 93 mg/dL (ref 70–99)

## 2020-08-30 LAB — BASIC METABOLIC PANEL
Anion gap: 11 (ref 5–15)
BUN: 33 mg/dL — ABNORMAL HIGH (ref 8–23)
CO2: 28 mmol/L (ref 22–32)
Calcium: 9.6 mg/dL (ref 8.9–10.3)
Chloride: 100 mmol/L (ref 98–111)
Creatinine, Ser: 1.98 mg/dL — ABNORMAL HIGH (ref 0.44–1.00)
GFR, Estimated: 25 mL/min — ABNORMAL LOW (ref >60)
Glucose, Bld: 99 mg/dL (ref 70–99)
Potassium: 3.8 mmol/L (ref 3.5–5.1)
Sodium: 139 mmol/L (ref 135–145)

## 2020-08-30 NOTE — ED Notes (Signed)
Pt being sent to ED for further evaluation per Dr. Meda Coffee.

## 2020-08-30 NOTE — ED Triage Notes (Signed)
Pt presents with dizziness x 2 days. Pt states she is not having headaches. She states she feels dizzy when she holds her head down. Pt states she also feels faint when she stands up and when she tries to reach out for something.

## 2020-08-30 NOTE — ED Triage Notes (Signed)
Pt arrives to ED from [facility] for c/o of dizziness x2 days. Pt states the dizziness was gradual and now intermittent. Associated with bending over and standing up. States she gets lightheaded when she gets dizzy. Denies CP, SOB, fever, and chills.

## 2020-08-31 MED ORDER — MECLIZINE HCL 25 MG PO TABS
12.5000 mg | ORAL_TABLET | Freq: Once | ORAL | Status: AC
  Administered 2020-08-31: 05:00:00 12.5 mg via ORAL
  Filled 2020-08-31: qty 1

## 2020-08-31 MED ORDER — MECLIZINE HCL 12.5 MG PO TABS: 12.5000 mg | ORAL_TABLET | Freq: Three times a day (TID) | ORAL | 0 refills | Status: DC | PRN

## 2020-08-31 NOTE — ED Provider Notes (Signed)
[name] Provider Note  CSN: [id number] Arrival date & time: 08/30/20 1440  Chief Complaint(s) Dizziness  HPI Jeanne [name] is a 84 y.o. female with a past medical history below who presents to the emergency department with vertiginous symptoms. Onset 2 to 3 days ago. Initially constant but now intermittent. Symptoms brought on by head motion. No recent fevers or infections. No focal deficits. No visual changes. No recent falls.  Patient reports similar episodes in the past and told she had BPPV by her PCP.   HPI  Past Medical History Past Medical History:  Diagnosis Date  . Anemia   . Arthritis   . Carotid arterial disease ([place])   . COVID-19   . Diabetes mellitus   . Hyperlipidemia   . Hypertension   . Migraine   . Obesity   . Reflux   . Sleep apnea    Patient Active Problem List   Diagnosis Date Noted  . Moderate nonproliferative diabetic retinopathy of both eyes ([place]) 05/25/2020  . Chorioretinal scar 05/25/2020  . Left epiretinal membrane 05/25/2020  . Posterior vitreous detachment of both eyes 05/25/2020  . Acute respiratory failure with hypoxia ([place]) 10/11/2019  . CAP (community acquired pneumonia) 10/11/2019  . Diastolic dysfunction, left ventricle 04/05/2019  . Atherosclerosis of native coronary artery of native heart with stable angina pectoris ([place]) 11/23/2018  . Hypercholesteremia 11/23/2018  . Unstable angina ([place]) 10/12/2015  . Abnormal myocardial perfusion study 10/12/2015  . NSVT (nonsustained ventricular tachycardia) ([place]) 10/11/2015  . Hypertension with heart disease 10/11/2015  . CKD (chronic kidney disease), stage III ([place]) 10/11/2015  . Pain in the chest   . Chest pain 10/08/2015  . Diabetes mellitus type II, non insulin dependent ([place]) 10/08/2015  . AKI (acute kidney injury) ([place])   . Abscess of left arm 01/01/2013  . Migraine   . Morbid obesity ([place])   . Sleep apnea   . Stenosis of left  carotid artery   . Reflux   . Anemia    Home Medication(s) Prior to Admission medications   Medication Sig Start Date End Date Taking? Authorizing Provider  allopurinol (ZYLOPRIM) 100 MG tablet Take 100 mg by mouth daily. 05/27/20  Yes [provider]  aspirin EC 81 MG tablet Take 81 mg by mouth daily.   Yes [provider]  Cholecalciferol (VITAMIN D3) 50 MCG (2000 UT) TABS Take 2,000 Units by mouth daily.  07/30/19  Yes [provider]  diltiazem (TIAZAC) 120 MG 24 hr capsule Take 120 mg by mouth in the morning and at bedtime.  09/08/19  Yes [provider]  ezetimibe (ZETIA) 10 MG tablet Take 10 mg by mouth daily.   Yes [provider]  ferrous sulfate 325 (65 FE) MG tablet Take 325 mg by mouth daily with breakfast.  07/30/19  Yes [provider]  furosemide (LASIX) 20 MG tablet TAKE 1 TABLET BY MOUTH EVERY DAY Patient taking differently: Take 20 mg by mouth daily. 07/12/20  Yes Adrian Prows, MD  hydrALAZINE (APRESOLINE) 25 MG tablet TAKE 1 TABLET BY MOUTH THREE TIMES A DAY Patient taking differently: Take 25 mg by mouth 3 (three) times daily. 07/14/20  Yes Patwardhan, Manish J, MD  insulin glargine (LANTUS) 100 UNIT/ML injection Inject 65 Units into the skin daily before breakfast.   Yes [provider]  isosorbide mononitrate (IMDUR) 30 MG 24 hr tablet Take 1 tablet (30 mg total) by mouth daily. 07/08/20 10/06/20 Yes Patwardhan, Manish J,  MD  latanoprost (XALATAN) 0.005 % ophthalmic solution Place 1 drop into the left eye at bedtime. 09/26/15  Yes [provider]  losartan (COZAAR) 25 MG tablet TAKE 2 TABLETS BY MOUTH DAILY Patient taking differently: Take 50 mg by mouth daily. 08/13/20  Yes Patwardhan, Reynold Bowen, MD  meclizine (ANTIVERT) 12.5 MG tablet Take 1 tablet (12.5 mg total) by mouth 3 (three) times daily as needed for dizziness. 08/31/20  Yes Dezaray Shibuya, Grayce Sessions, MD  nitroGLYCERIN (NITROSTAT) 0.4 MG SL tablet Place 1  tablet (0.4 mg total) under the tongue as needed for chest pain. 09/08/19  Yes Miquel Dunn, NP  oxybutynin (DITROPAN-XL) 10 MG 24 hr tablet Take 10 mg by mouth daily.   Yes [provider]  pantoprazole (PROTONIX) 40 MG tablet Take 40 mg by mouth daily before breakfast.  08/25/15  Yes [provider]  Polyvinyl Alcohol-Povidone (REFRESH OP) Place 1 drop into both eyes at bedtime.   Yes [provider]  insulin aspart (NOVOLOG) 100 UNIT/ML injection 0-9 Units, Subcutaneous, 3 times daily with meals CBG < 70: Implement Hypoglycemia measures/Call MD CBG 70 - 120: 0 units CBG 121 - 150: 1 unit CBG 151 - 200: 2 units CBG 201 - 250: 3 units CBG 251 - 300: 5 units CBG 301 - 350: 7 units CBG 351 - 400: 9 units CBG > 400: call MD Patient not taking: Reported on 08/31/2020 10/17/19   Jonetta Osgood, MD                                                                                                                                    Past Surgical History Past Surgical History:  Procedure Laterality Date  . CARDIAC CATHETERIZATION N/A 10/12/2015   Procedure: Left Heart Cath and Coronary Angiography;  Surgeon: Belva Crome, MD;  Location: [facility];  Service: Cardiovascular;  Laterality: N/A;  . CHOLECYSTECTOMY  50 yrs ago  . JOINT REPLACEMENT Right 2005   knee  . WISDOM TOOTH EXTRACTION     Family History Family History  Problem Relation Age of Onset  . Heart attack Father   . Heart attack Mother     Social History Social History   Tobacco Use  . Smoking status: Never Smoker  . Smokeless tobacco: Never Used  Vaping Use  . Vaping Use: Never used  Substance Use Topics  . Alcohol use: Never  . Drug use: No   Allergies Amoxicillin and Invokana [canagliflozin]  Review of Systems Review of Systems All other systems are reviewed and are negative for acute change except as noted in the HPI   Physical Exam Vital Signs  I have reviewed the  triage vital signs BP 114/78 (BP Location: Right Arm)   Pulse 75   Temp 98.4 F (36.9 C) (Oral)   Resp 14   Ht 5\' 2"  (1.575 m)   Wt (!) 145.2 kg  SpO2 100%   BMI 58.53 kg/m   Physical Exam Vitals reviewed.  Constitutional:      General: She is not in acute distress.    Appearance: She is well-developed and well-nourished. She is not diaphoretic.  HENT:     Head: Normocephalic and atraumatic.     Right Ear: External ear normal.     Left Ear: External ear normal.     Nose: Nose normal.  Eyes:     General: No scleral icterus.    Extraocular Movements: EOM normal.     Conjunctiva/sclera: Conjunctivae normal.  Neck:     Trachea: Phonation normal.  Cardiovascular:     Rate and Rhythm: Normal rate and regular rhythm.  Pulmonary:     Effort: Pulmonary effort is normal. No respiratory distress.     Breath sounds: No stridor.  Abdominal:     General: There is no distension.  Musculoskeletal:        General: No edema. Normal range of motion.     Cervical back: Normal range of motion.  Neurological:     Mental Status: She is alert and oriented to person, place, and time.     Comments: Mental Status:  Alert and oriented to person, place, and time.  Attention and concentration normal.  Speech clear.  Recent memory is intact  Cranial Nerves:  II Visual Fields: Intact to confrontation. Visual fields intact. III, IV, VI: Pupils equal and reactive to light and near. Full eye movement without nystagmus  V Facial Sensation: Normal. No weakness of masticatory muscles  VII: No facial weakness or asymmetry  VIII Auditory Acuity: Grossly normal  IX/X: The uvula is midline; the palate elevates symmetrically  XI: Normal sternocleidomastoid and trapezius strength  XII: The tongue is midline. No atrophy or fasciculations.   Motor System: Muscle Strength: 5/5 and symmetric in the upper and lower extremities. No pronation or drift.  Muscle Tone: Tone and muscle bulk are normal in the  upper and lower extremities.   Reflexes: DTRs: No Clonus Coordination: Intact finger-to-nose. No tremor.  Sensation: Intact to light touch.   HINTS Plus: Nystagmus: unilateral  Head impulse: abnormal Skew: normal Hearing: intact     Psychiatric:        Mood and Affect: Mood and affect normal.        Behavior: Behavior normal.     ED Results and Treatments Labs (all labs ordered are listed, but only abnormal results are displayed) Labs Reviewed  BASIC METABOLIC PANEL - Abnormal; Notable for the following components:      Result Value   BUN 33 (*)    Creatinine, Ser 1.98 (*)    GFR, Estimated 25 (*)    All other components within normal limits  CBC - Abnormal; Notable for the following components:   Hemoglobin 11.5 (*)    All other components within normal limits  URINALYSIS, ROUTINE W REFLEX MICROSCOPIC - Abnormal; Notable for the following components:   APPearance HAZY (*)    All other components within normal limits  CBG MONITORING, ED  EKG  EKG Interpretation  Date/Time:  Monday August 30 2020 16:01:59 EST Ventricular Rate:  85 PR Interval:  162 QRS Duration: 74 QT Interval:  370 QTC Calculation: 440 R Axis:   -77 Text Interpretation: Normal sinus rhythm Left axis deviation Inferior infarct , age undetermined Anterior infarct , age undetermined Abnormal ECG Low voltage QRS When compared with ECG of 06/09/2020, No significant change was found Reconfirmed by Addison Lank [phone]) on 08/31/2020 4:09:40 AM      Radiology No results found.  Pertinent labs & imaging results that were available during my care of the patient were reviewed by me and considered in my medical decision making (see chart for details).  Medications Ordered in ED Medications  meclizine (ANTIVERT) tablet 12.5 mg (12.5 mg Oral Given 08/31/20 0459)                                                                                                                                     Procedures Procedures  (including critical care time)  Medical Decision Making / ED Course I have reviewed the nursing notes for this encounter and the patient's prior records (if available in EHR or on provided paperwork).   BRITTAINY BUCKER was evaluated in Emergency Department on 08/31/2020 for the symptoms described in the history of present illness. She was evaluated in the context of the global COVID-19 pandemic, which necessitated consideration that the patient might be at risk for infection with the SARS-CoV-2 virus that causes COVID-19. Institutional protocols and algorithms that pertain to the evaluation of patients at risk for COVID-19 are in a state of rapid change based on information released by regulatory bodies including the CDC and federal and state organizations. These policies and algorithms were followed during the patient's care in the ED.  Here for vertiginous symptoms. HINT reassuring for peripheral process such as BPPV Low suspicion for CVA. meclizine given.  Labs reassuring with stable hemoglobin.  No significant electrolyte derangements.  Stable renal function.   Patient reports decreased vertiginous symptoms on reassessment.     Final Clinical Impression(s) / ED Diagnoses Final diagnoses:  Vertigo    The patient appears reasonably screened and/or stabilized for discharge and I doubt any other medical condition or other [facility] requiring further screening, evaluation, or treatment in the ED at this time prior to discharge. Safe for discharge with strict return precautions.  Disposition: Discharge  Condition: Good  I have discussed the results, Dx and Tx plan with the patient/family who expressed understanding and agree(s) with the plan. Discharge instructions discussed at length. The patient/family was given strict return precautions who verbalized  understanding of the instructions. No further questions at time of discharge.    ED Discharge Orders         Ordered    meclizine (ANTIVERT) 12.5 MG tablet  3 times daily PRN        08/31/20 0456  Follow Up: Seward Carol, MD [address] [place] [address] [phone]  Call  To schedule an appointment for close follow up     This chart was dictated using voice recognition software.  Despite best efforts to proofread,  errors can occur which can change the documentation meaning.   Fatima Blank, MD 08/31/20 [phone]

## 2020-10-07 ENCOUNTER — Ambulatory Visit: Payer: Medicare Other | Admitting: Cardiology

## 2020-10-07 DIAGNOSIS — I5032 Chronic diastolic (congestive) heart failure: Secondary | ICD-10-CM | POA: Insufficient documentation

## 2020-10-07 NOTE — Progress Notes (Signed)
No show °

## 2020-11-11 ENCOUNTER — Ambulatory Visit: Payer: Medicare Other | Admitting: Cardiology

## 2020-11-11 ENCOUNTER — Encounter: Payer: Self-pay | Admitting: Cardiology

## 2020-11-11 ENCOUNTER — Other Ambulatory Visit: Payer: Self-pay

## 2020-11-11 VITALS — BP 172/80 | HR 96 | Temp 97.7°F | Resp 16 | Ht 62.0 in | Wt 325.8 lb

## 2020-11-11 DIAGNOSIS — I25118 Atherosclerotic heart disease of native coronary artery with other forms of angina pectoris: Secondary | ICD-10-CM

## 2020-11-11 DIAGNOSIS — I10 Essential (primary) hypertension: Secondary | ICD-10-CM

## 2020-11-11 NOTE — Progress Notes (Signed)
Follow up visit  Subjective:   Jeanne [name], female    DOB: [birth year], 84 y.o.   MRN: [id number]     HPI   Chief Complaint  Patient presents with  . Coronary Artery Disease  . Follow-up    3 months    85 year old African-American female with nonobstructive CAD, hypertension, hyperlipidemia, type 2 DM, OSA,  IgG kappa monoclonal gammopathy   Patient has not had any recent chest pain episodes. Blood pressure is elevated today, usually lower than this. She wants to start walking regularly.    Current Outpatient Medications on File Prior to Visit  Medication Sig Dispense Refill  . allopurinol (ZYLOPRIM) 100 MG tablet Take 100 mg by mouth daily.    Marland Kitchen aspirin EC 81 MG tablet Take 81 mg by mouth daily.    . Cholecalciferol (VITAMIN D3) 50 MCG (2000 UT) TABS Take 2,000 Units by mouth daily.     Marland Kitchen diltiazem (TIAZAC) 120 MG 24 hr capsule Take 120 mg by mouth in the morning and at bedtime.     Marland Kitchen ezetimibe (ZETIA) 10 MG tablet Take 10 mg by mouth daily.    . ferrous sulfate 325 (65 FE) MG tablet Take 325 mg by mouth daily with breakfast.     . furosemide (LASIX) 20 MG tablet TAKE 1 TABLET BY MOUTH EVERY DAY (Patient taking differently: Take 20 mg by mouth daily.) 90 tablet 1  . hydrALAZINE (APRESOLINE) 25 MG tablet TAKE 1 TABLET BY MOUTH THREE TIMES A DAY (Patient taking differently: Take 25 mg by mouth 3 (three) times daily.) 270 tablet 3  . insulin aspart (NOVOLOG) 100 UNIT/ML injection 0-9 Units, Subcutaneous, 3 times daily with meals CBG < 70: Implement Hypoglycemia measures/Call MD CBG 70 - 120: 0 units CBG 121 - 150: 1 unit CBG 151 - 200: 2 units CBG 201 - 250: 3 units CBG 251 - 300: 5 units CBG 301 - 350: 7 units CBG 351 - 400: 9 units CBG > 400: call MD 10 mL 11  . insulin glargine (LANTUS) 100 UNIT/ML injection Inject 65 Units into the skin daily before breakfast.    . isosorbide mononitrate (IMDUR) 30 MG 24 hr tablet Take 1 tablet (30 mg total) by mouth daily. 90  tablet 3  . latanoprost (XALATAN) 0.005 % ophthalmic solution Place 1 drop into the left eye at bedtime.  5  . losartan (COZAAR) 25 MG tablet TAKE 2 TABLETS BY MOUTH DAILY (Patient taking differently: Take 50 mg by mouth daily.) 180 tablet 2  . meclizine (ANTIVERT) 12.5 MG tablet Take 1 tablet (12.5 mg total) by mouth 3 (three) times daily as needed for dizziness. 30 tablet 0  . nitroGLYCERIN (NITROSTAT) 0.4 MG SL tablet Place 1 tablet (0.4 mg total) under the tongue as needed for chest pain. 25 tablet 3  . oxybutynin (DITROPAN-XL) 10 MG 24 hr tablet Take 10 mg by mouth daily.    . pantoprazole (PROTONIX) 40 MG tablet Take 40 mg by mouth daily before breakfast.     . Polyvinyl Alcohol-Povidone (REFRESH OP) Place 1 drop into both eyes at bedtime.     No current facility-administered medications on file prior to visit.    Cardiovascular & other pertient studies:  CT Chest 06/09/2020: No evidence of parenchymal nodule to correspond with the findings of recent chest x-ray. The finding seen on recent chest x-ray are related to tortuosity of the vasculature as well exuberant degenerative changes in the thoracic spine and ribcage.  Small  sliding-type hiatal hernia.  Mild thickening of the pericardium greater than that seen on the prior exam which may represent a small effusion. The need for further follow-up can be determined on a clinical basis.  Aortic Atherosclerosis (ICD10-I70.0).  EKG 06/03/2020: Sinus rhythm 82 bpm  Left anterior fascicular block Nonspecific T-abnormality   Coronary angiogram 2017: 1. Prox LAD to Mid LAD lesion, 50% stenosed. 2. Ost 1st Diag to 1st Diag lesion, 70% stenosed. 3. Ost 2nd Diag to 2nd Diag lesion, 75% stenosed. 4. Dist Cx lesion, 40% stenosed. 5. Prox RCA to Dist RCA lesion, 30% stenosed.    Mild to moderate obstructive coronary disease involving the mid LAD, the first and second diagonal branches, the distal circumflex, and diffusely throughout  the mid RCA.  No clinically significant obstruction is identified especially with reference to the perfusion abnormalities noted on radionuclide scintigraphy.  Previously documented normal left ventricular function. Normal left ventricular hemodynamics were identified with an end-diastolic pressure of 13 mmHg.   Recommendations:   Consider alternative explanations for the patient's chest discomfort  Disposition is per internal medicine.   Echocardiogram 04/01/2019: 1. The left ventricle has normal systolic function with an ejection  fraction of 60-65%. The cavity size was normal. There is mildly increased  left ventricular wall thickness. Left ventricular diastolic Doppler  parameters are consistent with impaired  relaxation. Elevated left ventricular end-diastolic pressure The E/e' is  >15. No evidence of left ventricular regional wall motion abnormalities.  2. The right ventricle has mildly reduced systolic function. The cavity  was moderately enlarged. There is no increase in right ventricular wall  thickness.  3. The aortic valve is grossly normal.  4. The aorta is normal in size and structure.   10/2018: Normal examination. No evidence of hemodynamically significant peripheral arterial disease.  Signed,  Recent labs: 08/30/2020: Glucose 99, BUN/Cr 33/1.98. EGFR 25. Na/K 139/3.8.  H/H 11/36. MCV 86. Platelets 255  06/09/2020: Glucose 158, BUN/Cr 20/1.79. EGFR N/A. Na/K 140/2.9. Rest of the CMP normal H/H 10/33. MCV 87. Platelets 247  01/27/2020: Glucose 250, BUN/Cr 22/1.63. EGFR 34. Na/K 140/3.5. Rest of the CMP normal H/H 12/37. MCV 85. Platelets 200  03/30/2020: Chol 92, TG 77, HDL 32, LDL 45   Review of Systems  Cardiovascular: Positive for chest pain. Negative for dyspnea on exertion, leg swelling, palpitations and syncope.         Vitals:   11/11/20 1248  BP: (!) 172/80  Pulse: 96  Resp: 16  Temp: 97.7 F (36.5 C)  SpO2: 98%     Body  mass index is 59.59 kg/m. Filed Weights   11/11/20 1248  Weight: (!) 325 lb 12.8 oz (147.8 kg)     Objective:   Physical Exam Vitals and nursing note reviewed.  Constitutional:      General: She is not in acute distress. Neck:     Vascular: No JVD.  Cardiovascular:     Rate and Rhythm: Normal rate and regular rhythm.     Heart sounds: Normal heart sounds. No murmur heard.   Pulmonary:     Effort: Pulmonary effort is normal.     Breath sounds: Normal breath sounds. No wheezing or rales.           Assessment & Recommendations:   85 year old African-American female with nonobstructive CAD, hypertension, hyperlipidemia, type 2 DM, OSA,  IgG kappa monoclonal gammopathy   Chest pain: Atypical angina. Known mild nonobstructive CAD in 2017. Currently resolved on medical therapy.  Continue Aspirin  81 mg. Lipid well controlled with pravastatin 40 mg. Will check echocardiogram  Hypertension: BP elevated today. She has f/u w/PCP Dr Delfina Redwood on 12/02/2020.  F/u in 6 months   Nigel Mormon, MD Pager: [phone] Office: [phone]

## 2020-11-24 ENCOUNTER — Other Ambulatory Visit (INDEPENDENT_AMBULATORY_CARE_PROVIDER_SITE_OTHER): Payer: Self-pay | Admitting: Ophthalmology

## 2020-12-01 ENCOUNTER — Other Ambulatory Visit: Payer: Self-pay | Admitting: Cardiology

## 2020-12-01 DIAGNOSIS — I519 Heart disease, unspecified: Secondary | ICD-10-CM

## 2020-12-01 DIAGNOSIS — I5032 Chronic diastolic (congestive) heart failure: Secondary | ICD-10-CM

## 2020-12-12 ENCOUNTER — Emergency Department (HOSPITAL_COMMUNITY)
Admission: EM | Admit: 2020-12-12 | Discharge: 2020-12-12 | Disposition: A | Discharge status: Home / Self Care | Payer: Medicare Other | Attending: Emergency Medicine | Admitting: Emergency Medicine

## 2020-12-12 ENCOUNTER — Emergency Department (HOSPITAL_COMMUNITY): Payer: Medicare Other

## 2020-12-12 DIAGNOSIS — M47812 Spondylosis without myelopathy or radiculopathy, cervical region: Secondary | ICD-10-CM | POA: Diagnosis not present

## 2020-12-12 DIAGNOSIS — I2510 Atherosclerotic heart disease of native coronary artery without angina pectoris: Secondary | ICD-10-CM | POA: Diagnosis not present

## 2020-12-12 DIAGNOSIS — Z794 Long term (current) use of insulin: Secondary | ICD-10-CM | POA: Insufficient documentation

## 2020-12-12 DIAGNOSIS — Z79899 Other long term (current) drug therapy: Secondary | ICD-10-CM | POA: Insufficient documentation

## 2020-12-12 DIAGNOSIS — Z7982 Long term (current) use of aspirin: Secondary | ICD-10-CM | POA: Diagnosis not present

## 2020-12-12 DIAGNOSIS — M25552 Pain in left hip: Secondary | ICD-10-CM | POA: Diagnosis not present

## 2020-12-12 DIAGNOSIS — I130 Hypertensive heart and chronic kidney disease with heart failure and stage 1 through stage 4 chronic kidney disease, or unspecified chronic kidney disease: Secondary | ICD-10-CM | POA: Insufficient documentation

## 2020-12-12 DIAGNOSIS — M50321 Other cervical disc degeneration at C4-C5 level: Secondary | ICD-10-CM | POA: Diagnosis not present

## 2020-12-12 DIAGNOSIS — W19XXXA Unspecified fall, initial encounter: Secondary | ICD-10-CM

## 2020-12-12 DIAGNOSIS — I5032 Chronic diastolic (congestive) heart failure: Secondary | ICD-10-CM | POA: Insufficient documentation

## 2020-12-12 DIAGNOSIS — I6782 Cerebral ischemia: Secondary | ICD-10-CM | POA: Diagnosis not present

## 2020-12-12 DIAGNOSIS — S7002XA Contusion of left hip, initial encounter: Secondary | ICD-10-CM

## 2020-12-12 DIAGNOSIS — N183 Chronic kidney disease, stage 3 unspecified: Secondary | ICD-10-CM | POA: Insufficient documentation

## 2020-12-12 DIAGNOSIS — E119 Type 2 diabetes mellitus without complications: Secondary | ICD-10-CM | POA: Insufficient documentation

## 2020-12-12 LAB — CBG MONITORING, ED: Glucose-Capillary: 99 mg/dL (ref 70–99)

## 2020-12-12 MED ORDER — OXYCODONE HCL 5 MG PO TABS: 2.5000 mg | ORAL_TABLET | Freq: Four times a day (QID) | ORAL | 0 refills | Status: DC | PRN

## 2020-12-12 MED ORDER — FENTANYL CITRATE (PF) 100 MCG/2ML IJ SOLN
50.0000 ug | Freq: Once | INTRAMUSCULAR | Status: AC
  Administered 2020-12-12: 50 ug via INTRAVENOUS
  Filled 2020-12-12: qty 2

## 2020-12-12 MED ORDER — MORPHINE SULFATE (PF) 4 MG/ML IV SOLN
4.0000 mg | Freq: Once | INTRAVENOUS | Status: AC
  Administered 2020-12-12: 4 mg via INTRAVENOUS
  Filled 2020-12-12: qty 1

## 2020-12-12 NOTE — ED Notes (Signed)
Ambulated pt in hall with walker (baseline). Was slow to get up, but tolerated well.

## 2020-12-12 NOTE — ED Provider Notes (Signed)
[facility] EMERGENCY DEPARTMENT Provider Note   CSN: [id number] Arrival date & time: 12/12/20  0944     History Chief Complaint  Patient presents with  . Fall    Jeanne [name] is a 85 y.o. female history of morbid obesity, CAD, anemia, arthritis, diabetes, hypertension, hyperlipidemia, migraines.  Patient presents to the ER for concern of left hip pain.  Patient reports 2 days ago she was attempting to sit in her chair when she fell backwards missing the seat and landing on her left hip, she reports no pain initially she was able to ambulate as normal that day, she reports some mild pain the next day.  Today she woke up and had severe worsening of pain she describes it as aching constant nonradiating worsened with movement of the left hip some improvement with Tylenol, she reports she is unable to ambulate today due to pain.  Denies fever/chills, head injury, blood thinner use, loss conscious, neck pain, back pain, chest pain, abdominal pain, upper extremity pain, right lower extremity pain, numbness/tingling, weakness or any additional concerns.  HPI     Past Medical History:  Diagnosis Date  . Anemia   . Arthritis   . Carotid arterial disease ([place])   . COVID-19   . Diabetes mellitus   . Hyperlipidemia   . Hypertension   . Migraine   . Obesity   . Reflux   . Sleep apnea     Patient Active Problem List   Diagnosis Date Noted  . Chronic diastolic heart failure ([place]) 10/07/2020  . Moderate nonproliferative diabetic retinopathy of both eyes ([place]) 05/25/2020  . Chorioretinal scar 05/25/2020  . Left epiretinal membrane 05/25/2020  . Posterior vitreous detachment of both eyes 05/25/2020  . Acute respiratory failure with hypoxia ([place]) 10/11/2019  . CAP (community acquired pneumonia) 10/11/2019  . Diastolic dysfunction, left ventricle 04/05/2019  . Coronary artery disease of native artery of native heart with stable angina pectoris ([place]) 11/23/2018  .  Hypercholesteremia 11/23/2018  . Unstable angina ([place]) 10/12/2015  . Abnormal myocardial perfusion study 10/12/2015  . NSVT (nonsustained ventricular tachycardia) ([place]) 10/11/2015  . Primary hypertension 10/11/2015  . CKD (chronic kidney disease), stage III ([place]) 10/11/2015  . Pain in the chest   . Chest pain 10/08/2015  . Diabetes mellitus type II, non insulin dependent ([place]) 10/08/2015  . AKI (acute kidney injury) ([place])   . Abscess of left arm 01/01/2013  . Migraine   . Morbid obesity ([place])   . Sleep apnea   . Stenosis of left carotid artery   . Reflux   . Anemia     Past Surgical History:  Procedure Laterality Date  . CARDIAC CATHETERIZATION N/A 10/12/2015   Procedure: Left Heart Cath and Coronary Angiography;  Surgeon: Belva Crome, MD;  Location: [facility];  Service: Cardiovascular;  Laterality: N/A;  . CHOLECYSTECTOMY  50 yrs ago  . JOINT REPLACEMENT Right 2005   knee  . WISDOM TOOTH EXTRACTION       OB History   No obstetric history on file.     Family History  Problem Relation Age of Onset  . Heart attack Father   . Heart attack Mother     Social History   Tobacco Use  . Smoking status: Never Smoker  . Smokeless tobacco: Never Used  Vaping Use  . Vaping Use: Never used  Substance Use Topics  . Alcohol use: Never  . Drug use: No    Home  Medications Prior to Admission medications   Medication Sig Start Date End Date Taking? Authorizing Provider  aspirin EC 81 MG tablet Take 81 mg by mouth daily.   Yes [provider]  Cholecalciferol (VITAMIN D3) 50 MCG (2000 UT) TABS Take 2,000 Units by mouth daily.  07/30/19  Yes [provider]  diltiazem (TIAZAC) 120 MG 24 hr capsule Take 240 mg by mouth daily. 09/08/19  Yes [provider]  ezetimibe (ZETIA) 10 MG tablet Take 10 mg by mouth daily.   Yes [provider]  ferrous sulfate 325 (65 FE) MG tablet Take 325 mg by mouth daily with breakfast.  07/30/19  Yes  [provider]  furosemide (LASIX) 20 MG tablet TAKE 1 TABLET BY MOUTH EVERY DAY Patient taking differently: Take 20 mg by mouth daily. 12/01/20  Yes Patwardhan, Manish J, MD  insulin glargine (LANTUS) 100 UNIT/ML injection Inject 65 Units into the skin daily before breakfast.   Yes [provider]  isosorbide mononitrate (IMDUR) 30 MG 24 hr tablet Take 1 tablet (30 mg total) by mouth daily. 07/08/20 10/06/20 Yes Patwardhan, Manish J, MD  latanoprost (XALATAN) 0.005 % ophthalmic solution INSTILL 1 DROP IN LEFT EYE NIGHTLY Patient taking differently: Place 1 drop into the left eye at bedtime. 12/01/20  Yes Rankin, Clent Demark, MD  meclizine (ANTIVERT) 12.5 MG tablet Take 1 tablet (12.5 mg total) by mouth 3 (three) times daily as needed for dizziness. Patient taking differently: Take 12.5 mg by mouth 2 (two) times daily. 08/31/20  Yes Cardama, Grayce Sessions, MD  nitroGLYCERIN (NITROSTAT) 0.4 MG SL tablet Place 1 tablet (0.4 mg total) under the tongue as needed for chest pain. 09/08/19  Yes Miquel Dunn, NP  oxybutynin (DITROPAN-XL) 10 MG 24 hr tablet Take 10 mg by mouth daily.   Yes [provider]  pantoprazole (PROTONIX) 40 MG tablet Take 40 mg by mouth daily before breakfast.  08/25/15  Yes [provider]  Polyvinyl Alcohol-Povidone (REFRESH OP) Place 1 drop into both eyes at bedtime.   Yes [provider]  allopurinol (ZYLOPRIM) 100 MG tablet Take 100 mg by mouth daily. 05/27/20   [provider]  hydrALAZINE (APRESOLINE) 25 MG tablet TAKE 1 TABLET BY MOUTH THREE TIMES A DAY Patient taking differently: Take 25 mg by mouth 3 (three) times daily. 07/14/20   Patwardhan, Manish J, MD  insulin aspart (NOVOLOG) 100 UNIT/ML injection 0-9 Units, Subcutaneous, 3 times daily with meals CBG < 70: Implement Hypoglycemia measures/Call MD CBG 70 - 120: 0 units CBG 121 - 150: 1 unit CBG 151 - 200: 2 units CBG 201 - 250: 3 units CBG 251 - 300: 5 units CBG  301 - 350: 7 units CBG 351 - 400: 9 units CBG > 400: call MD Patient not taking: No sig reported 10/17/19   Jonetta Osgood, MD  losartan (COZAAR) 25 MG tablet TAKE 2 TABLETS BY MOUTH DAILY Patient taking differently: Take 50 mg by mouth daily. 08/13/20   Patwardhan, Reynold Bowen, MD    Allergies    Amoxicillin and Invokana [canagliflozin]  Review of Systems   Review of Systems Ten systems are reviewed and are negative for acute change except as noted in the HPI  Physical Exam Updated Vital Signs BP (!) 144/84   Pulse 79   Temp 98.3 F (36.8 C)   Resp 15   Ht 5\' 2"  (1.575 m)   Wt (!) 146.1 kg   SpO2 100%   BMI 58.89  kg/m   Physical Exam Constitutional:      General: She is not in acute distress.    Appearance: Normal appearance. She is well-developed. She is obese. She is not ill-appearing or diaphoretic.  HENT:     Head: Normocephalic and atraumatic.  Eyes:     General: Vision grossly intact. Gaze aligned appropriately.     Pupils: Pupils are equal, round, and reactive to light.  Neck:     Trachea: Trachea and phonation normal.  Pulmonary:     Effort: Pulmonary effort is normal. No respiratory distress.  Abdominal:     General: There is no distension.     Palpations: Abdomen is soft.     Tenderness: There is no abdominal tenderness. There is no guarding or rebound.  Musculoskeletal:        General: Normal range of motion.     Cervical back: Normal range of motion.       Legs:     Comments: No midline C/T/L spinal tenderness to palpation, no paraspinal muscle tenderness, no deformity, crepitus, or step-off noted. No sign of injury to the neck or back. - Pelvis stable compression bilateral without pain.  Tenderness to the gluteal muscles without overlying skin change.  No pain with palpation or motion at the knee ankle or left foot.  Pedal pulses intact and equal bilaterally.  Capillary refill and sensation intact all toes.  Appropriate range of motion and strength  with movement of the bilateral upper extremities without pain  Skin:    General: Skin is warm and dry.  Neurological:     Mental Status: She is alert.     GCS: GCS eye subscore is 4. GCS verbal subscore is 5. GCS motor subscore is 6.     Comments: Speech is clear and goal oriented, follows commands Major Cranial nerves without deficit, no facial droop Moves extremities without ataxia, coordination intact  Psychiatric:        Behavior: Behavior normal.     ED Results / Procedures / Treatments   Labs (all labs ordered are listed, but only abnormal results are displayed) Labs Reviewed - No data to display  EKG None  Radiology DG Chest 1 View  Result Date: 12/12/2020 CLINICAL DATA:  Pain following fall EXAM: CHEST  1 VIEW COMPARISON:  June 09, 2020 FINDINGS: No evident edema or airspace opacity. Heart is upper normal in size with pulmonary vascularity normal. No adenopathy. There is aortic atherosclerosis. No pneumothorax. There is degenerative change in each shoulder. IMPRESSION: No edema or airspace opacity. Heart upper normal in size. Aortic Atherosclerosis (ICD10-I70.0). Electronically Signed   By: Lowella Grip III M.D.   On: 12/12/2020 11:30   DG Lumbar Spine Complete  Result Date: 12/12/2020 CLINICAL DATA:  Pain following fall EXAM: LUMBAR SPINE - COMPLETE 4+ VIEW COMPARISON:  June 07, 2016 FINDINGS: Frontal, lateral, spot lumbosacral lateral, and bilateral oblique views were obtained. There are 5 non-rib-bearing lumbar type vertebral bodies. There is no fracture or spondylolisthesis. There is severe disc space narrowing at L4-5 and L5-S1. There is milder disc space narrowing at L1-2 and L2-3. Anterior osteophytes are noted at all levels. There is facet osteoarthritic change at L3-4, L4-5, and L5-S1 bilaterally. There is aortic atherosclerosis. IMPRESSION: No fracture or spondylolisthesis. Multilevel osteoarthritic change as noted. Aortic Atherosclerosis (ICD10-I70.0).  Electronically Signed   By: Lowella Grip III M.D.   On: 12/12/2020 11:27   DG Pelvis 1-2 Views  Result Date: 12/12/2020 CLINICAL DATA:  Pain following fall  EXAM: PELVIS - 1-2 VIEW COMPARISON:  February 02, 2013 FINDINGS: No fracture or dislocation. There is relatively mild symmetric narrowing of each hip joint. No erosive change. IMPRESSION: Symmetric narrowing each hip joint.  No fracture or dislocation. Electronically Signed   By: Lowella Grip III M.D.   On: 12/12/2020 11:31   CT Head Wo Contrast  Result Date: 12/12/2020 CLINICAL DATA:  85 year old female with history of minor trauma after fall two days ago. EXAM: CT HEAD WITHOUT CONTRAST CT CERVICAL SPINE WITHOUT CONTRAST TECHNIQUE: Multidetector CT imaging of the head and cervical spine was performed following the standard protocol without intravenous contrast. Multiplanar CT image reconstructions of the cervical spine were also generated. COMPARISON:  No prior head CT.  CT of the cervical spine 10/13/2011. FINDINGS: CT HEAD FINDINGS Brain: Mild cerebral atrophy. Patchy and confluent areas of decreased attenuation are noted throughout the deep and periventricular white matter of the cerebral hemispheres bilaterally, compatible with chronic microvascular ischemic disease. No evidence of acute infarction, hemorrhage, hydrocephalus, extra-axial collection or mass lesion/mass effect. Vascular: No hyperdense vessel or unexpected calcification. Skull: Normal. Negative for fracture or focal lesion. Sinuses/Orbits: No acute finding. Other: None. CT CERVICAL SPINE FINDINGS Alignment: Normal. Skull base and vertebrae: No acute fracture. No primary bone lesion or focal pathologic process. Soft tissues and spinal canal: No prevertebral fluid or swelling. No visible canal hematoma. Disc levels: Multilevel degenerative disc disease, most pronounced at C3-C4, C4-C5, C5-C6 and C6-C7. Mild multilevel facet arthropathy. Upper chest: Negative. Other: None. IMPRESSION:  1. No evidence of significant acute traumatic injury to the skull, brain or cervical spine. 2. Mild cerebral atrophy with chronic microvascular ischemic changes in the cerebral white matter, as above. 3. Multilevel degenerative disc disease and cervical spondylosis, as above. Electronically Signed   By: Vinnie Langton M.D.   On: 12/12/2020 12:38   CT Cervical Spine Wo Contrast  Result Date: 12/12/2020 CLINICAL DATA:  85 year old female with history of minor trauma after fall two days ago. EXAM: CT HEAD WITHOUT CONTRAST CT CERVICAL SPINE WITHOUT CONTRAST TECHNIQUE: Multidetector CT imaging of the head and cervical spine was performed following the standard protocol without intravenous contrast. Multiplanar CT image reconstructions of the cervical spine were also generated. COMPARISON:  No prior head CT.  CT of the cervical spine 10/13/2011. FINDINGS: CT HEAD FINDINGS Brain: Mild cerebral atrophy. Patchy and confluent areas of decreased attenuation are noted throughout the deep and periventricular white matter of the cerebral hemispheres bilaterally, compatible with chronic microvascular ischemic disease. No evidence of acute infarction, hemorrhage, hydrocephalus, extra-axial collection or mass lesion/mass effect. Vascular: No hyperdense vessel or unexpected calcification. Skull: Normal. Negative for fracture or focal lesion. Sinuses/Orbits: No acute finding. Other: None. CT CERVICAL SPINE FINDINGS Alignment: Normal. Skull base and vertebrae: No acute fracture. No primary bone lesion or focal pathologic process. Soft tissues and spinal canal: No prevertebral fluid or swelling. No visible canal hematoma. Disc levels: Multilevel degenerative disc disease, most pronounced at C3-C4, C4-C5, C5-C6 and C6-C7. Mild multilevel facet arthropathy. Upper chest: Negative. Other: None. IMPRESSION: 1. No evidence of significant acute traumatic injury to the skull, brain or cervical spine. 2. Mild cerebral atrophy with chronic  microvascular ischemic changes in the cerebral white matter, as above. 3. Multilevel degenerative disc disease and cervical spondylosis, as above. Electronically Signed   By: Vinnie Langton M.D.   On: 12/12/2020 12:38   CT Hip Left Wo Contrast  Result Date: 12/12/2020 CLINICAL DATA:  Left hip pain after fall 2 days  ago. EXAM: CT OF THE LEFT HIP WITHOUT CONTRAST TECHNIQUE: Multidetector CT imaging of the left hip was performed according to the standard protocol. Multiplanar CT image reconstructions were also generated. COMPARISON:  Pelvis and left femur x-rays from same day. FINDINGS: Bones/Joint/Cartilage No fracture or dislocation. Mild to moderate left hip osteoarthritis. No joint effusion. Advanced degenerative changes of the sacroiliac joints and lower lumbar spine. Ligaments Ligaments are suboptimally evaluated by CT. Muscles and Tendons Grossly intact. Soft tissue No fluid collection or hematoma. No soft tissue mass. Unchanged fat containing umbilical hernia. Unchanged enlarged, fibroid uterus. IMPRESSION: 1. No acute osseous abnormality. 2. Mild to moderate left hip osteoarthritis. Electronically Signed   By: Titus Dubin M.D.   On: 12/12/2020 13:36   DG Femur Min 2 Views Left  Result Date: 12/12/2020 CLINICAL DATA:  Pain following fall EXAM: LEFT FEMUR 2 VIEWS COMPARISON:  None. FINDINGS: Frontal and lateral views were obtained. No fracture or dislocation. There is extensive osteoarthritic change in the knee with narrowing of all compartments and spurring in all compartments. There is a small knee joint effusion. There is slight narrowing of the left hip joint. No erosion. IMPRESSION: No fracture or dislocation. Severe generalized osteoarthritic change left knee joint with small left pleural effusion. Mild narrowing left hip joint. Electronically Signed   By: Lowella Grip III M.D.   On: 12/12/2020 11:28    Procedures Procedures   Medications Ordered in ED Medications  morphine 4  MG/ML injection 4 mg (has no administration in time range)  fentaNYL (SUBLIMAZE) injection 50 mcg (50 mcg Intravenous Given 12/12/20 1124)    ED Course  I have reviewed the triage vital signs and the nursing notes.  Pertinent labs & imaging results that were available during my care of the patient were reviewed by me and considered in my medical decision making (see chart for details).    MDM Rules/Calculators/A&P                         Additional history obtained from: 1. Nursing notes from this visit. 2. Review of electronic medical records. ---------------- CT head/Cspine:  IMPRESSION:  1. No evidence of significant acute traumatic injury to the skull,  brain or cervical spine.  2. Mild cerebral atrophy with chronic microvascular ischemic changes  in the cerebral white matter, as above.  3. Multilevel degenerative disc disease and cervical spondylosis, as  above.   DG Chest:  IMPRESSION:  No edema or airspace opacity. Heart upper normal in size. Aortic  Atherosclerosis (ICD10-I70.0).   DG Lumbar Spine:  IMPRESSION:  No fracture or spondylolisthesis. Multilevel osteoarthritic change  as noted.    Aortic Atherosclerosis (ICD10-I70.0).   DG Pelvis:    IMPRESSION:  Symmetric narrowing each hip joint. No fracture or dislocation.   DG Left Femur:  IMPRESSION:  No fracture or dislocation. Severe generalized osteoarthritic change  left knee joint with small left pleural effusion. Mild narrowing  left hip joint.   CT Left Hip:   IMPRESSION:  1. No acute osseous abnormality.  2. Mild to moderate left hip osteoarthritis.   - Suspect patient symptoms today secondary to muscular skeletal left hip pain muscular contusion versus flareup of arthritis.  No evidence for fracture on CT scan today, neurovascular intact distally.  No abdominal pain or chest pain indicate further imaging of those areas.  Plan of care is pain control and to ambulate patient with her cane.  When  she ambulates feel patient  is safe for discharge will refer to orthopedic, on-call Dr. Doreatha Martin for follow-up.  Patient was seen and evaluated by Dr. Jeanell Sparrow during this visit who agrees with plan of care.  Care handoff given to [place] Robinson PA-C at shift change will reassess the patient after ambulation and pain control.  Note: Portions of this report may have been transcribed using voice recognition software. Every effort was made to ensure accuracy; however, inadvertent computerized transcription errors may still be present. Final Clinical Impression(s) / ED Diagnoses Final diagnoses:  Fall, initial encounter    Rx / DC Orders ED Discharge Orders    None       Gari Crown 12/12/20 1519    Pattricia Boss, MD 12/13/20 [phone]

## 2020-12-12 NOTE — Discharge Instructions (Addendum)
Please read instructions below. Apply ice to your areas of pain for 20 minutes at a time. You can take tylenol every 6 hours as needed for pain. You can take 1/2 - 1 tab of the oxycodone every 6 hours for more severe pain. Be aware this can make you drowsy or increase your risks of falling so be cautious while taking. Schedule an appointment with your primary care to follow up on your visit today. If symptoms persist, you have been given referral to orthopedics as needed.  Return to the ER for new or concerning symptoms.

## 2020-12-12 NOTE — ED Provider Notes (Signed)
Care assumed at shift change from [facility], [place], pending medication administration, ambulation and discharge. See their note for full HPI and workup. Briefly, pt presenting with persisting left hip pain after mechanical fall out of a chair couple of days ago.  Imaging is all negative.  There is some delay in medication administration, plan to medicate, ambulate, which she does at baseline with cane/walker, and discharged home with symptomatic management, Ortho referral.  On evaluation, patient reports improvement after morphine.  Physical Exam  BP 136/84   Pulse 84   Temp 98.3 F (36.8 C)   Resp 16   Ht 5\' 2"  (1.575 m)   Wt (!) 146.1 kg   SpO2 98%   BMI 58.89 kg/m   Physical Exam Vitals and nursing note reviewed.  Constitutional:      General: She is not in acute distress.    Appearance: She is well-developed.  HENT:     Head: Normocephalic and atraumatic.  Eyes:     Conjunctiva/sclera: Conjunctivae normal.  Cardiovascular:     Rate and Rhythm: Normal rate.  Pulmonary:     Effort: Pulmonary effort is normal.  Neurological:     Mental Status: She is alert.  Psychiatric:        Mood and Affect: Mood normal.        Behavior: Behavior normal.     ED Course/Procedures     Procedures  MDM  Per nursing staff, patient is ambulating in the hallway with steady gait with walker.  Will prescribe short course of pain medication for symptom relief, instructed to take 0.5-1 tabs as needed.  Treat additionally with Tylenol.  Patient is concern for falling again once at home.  Ordered consultations for home health evaluation and assistance.  [facility] Controlled Substance reporting System queried      Delvon Chipps, [place] N, PA-C 12/12/20 1839    Pattricia Boss, MD 12/13/20 1414

## 2020-12-12 NOTE — ED Triage Notes (Signed)
Pt arrived from home, complaining of left hip pain from hitting it on a chair about 2 days ago when trying to sit down  Denies hitting her head or LOC Pain was tolerable with tylenol till this am when she was unable to ambulate due to pain   No obvious deformities or shortening   VS per EMS 164/88 98  98% 144 cbg

## 2021-04-14 ENCOUNTER — Other Ambulatory Visit: Payer: Self-pay | Admitting: Cardiology

## 2021-04-14 DIAGNOSIS — I5032 Chronic diastolic (congestive) heart failure: Secondary | ICD-10-CM

## 2021-04-14 DIAGNOSIS — I519 Heart disease, unspecified: Secondary | ICD-10-CM

## 2021-04-29 ENCOUNTER — Emergency Department (HOSPITAL_COMMUNITY)
Admission: EM | Admit: 2021-04-29 | Discharge: 2021-04-29 | Disposition: A | Discharge status: Home / Self Care | Payer: Medicare Other | Attending: Emergency Medicine | Admitting: Emergency Medicine

## 2021-04-29 ENCOUNTER — Other Ambulatory Visit: Payer: Self-pay

## 2021-04-29 ENCOUNTER — Encounter (HOSPITAL_COMMUNITY): Payer: Self-pay

## 2021-04-29 ENCOUNTER — Emergency Department (HOSPITAL_COMMUNITY): Payer: Medicare Other

## 2021-04-29 DIAGNOSIS — E1122 Type 2 diabetes mellitus with diabetic chronic kidney disease: Secondary | ICD-10-CM | POA: Diagnosis not present

## 2021-04-29 DIAGNOSIS — Z79899 Other long term (current) drug therapy: Secondary | ICD-10-CM | POA: Diagnosis not present

## 2021-04-29 DIAGNOSIS — K219 Gastro-esophageal reflux disease without esophagitis: Secondary | ICD-10-CM | POA: Insufficient documentation

## 2021-04-29 DIAGNOSIS — Z96651 Presence of right artificial knee joint: Secondary | ICD-10-CM | POA: Diagnosis not present

## 2021-04-29 DIAGNOSIS — R079 Chest pain, unspecified: Secondary | ICD-10-CM

## 2021-04-29 DIAGNOSIS — E113293 Type 2 diabetes mellitus with mild nonproliferative diabetic retinopathy without macular edema, bilateral: Secondary | ICD-10-CM | POA: Insufficient documentation

## 2021-04-29 DIAGNOSIS — N183 Chronic kidney disease, stage 3 unspecified: Secondary | ICD-10-CM | POA: Diagnosis not present

## 2021-04-29 DIAGNOSIS — I5032 Chronic diastolic (congestive) heart failure: Secondary | ICD-10-CM | POA: Insufficient documentation

## 2021-04-29 DIAGNOSIS — I130 Hypertensive heart and chronic kidney disease with heart failure and stage 1 through stage 4 chronic kidney disease, or unspecified chronic kidney disease: Secondary | ICD-10-CM | POA: Insufficient documentation

## 2021-04-29 DIAGNOSIS — M79602 Pain in left arm: Secondary | ICD-10-CM | POA: Diagnosis not present

## 2021-04-29 DIAGNOSIS — I25118 Atherosclerotic heart disease of native coronary artery with other forms of angina pectoris: Secondary | ICD-10-CM | POA: Insufficient documentation

## 2021-04-29 DIAGNOSIS — D631 Anemia in chronic kidney disease: Secondary | ICD-10-CM | POA: Insufficient documentation

## 2021-04-29 DIAGNOSIS — Z794 Long term (current) use of insulin: Secondary | ICD-10-CM | POA: Insufficient documentation

## 2021-04-29 DIAGNOSIS — Z7982 Long term (current) use of aspirin: Secondary | ICD-10-CM | POA: Diagnosis not present

## 2021-04-29 DIAGNOSIS — Z8616 Personal history of COVID-19: Secondary | ICD-10-CM | POA: Diagnosis not present

## 2021-04-29 LAB — BASIC METABOLIC PANEL
Anion gap: 8 (ref 5–15)
BUN: 19 mg/dL (ref 8–23)
CO2: 30 mmol/L (ref 22–32)
Calcium: 9 mg/dL (ref 8.9–10.3)
Chloride: 99 mmol/L (ref 98–111)
Creatinine, Ser: 1.63 mg/dL — ABNORMAL HIGH (ref 0.44–1.00)
GFR, Estimated: 31 mL/min — ABNORMAL LOW (ref >60)
Glucose, Bld: 186 mg/dL — ABNORMAL HIGH (ref 70–99)
Potassium: 4 mmol/L (ref 3.5–5.1)
Sodium: 137 mmol/L (ref 135–145)

## 2021-04-29 LAB — CBC
HCT: 36.4 % (ref 36.0–46.0)
Hemoglobin: 11.1 g/dL — ABNORMAL LOW (ref 12.0–15.0)
MCH: 27.6 pg (ref 26.0–34.0)
MCHC: 30.5 g/dL (ref 30.0–36.0)
MCV: 90.5 fL (ref 80.0–100.0)
Platelets: 237 10*3/uL (ref 150–400)
RBC: 4.02 MIL/uL (ref 3.87–5.11)
RDW: 14 % (ref 11.5–15.5)
WBC: 6.3 10*3/uL (ref 4.0–10.5)
nRBC: 0 % (ref 0.0–0.2)

## 2021-04-29 LAB — TROPONIN I (HIGH SENSITIVITY)
Troponin I (High Sensitivity): 15 ng/L (ref <18)
Troponin I (High Sensitivity): 14 ng/L (ref <18)

## 2021-04-29 MED ORDER — ALUM & MAG HYDROXIDE-SIMETH 200-200-20 MG/5ML PO SUSP
30.0000 mL | Freq: Once | ORAL | Status: AC
  Administered 2021-04-29: 30 mL via ORAL
  Filled 2021-04-29: qty 30

## 2021-04-29 MED ORDER — LIDOCAINE VISCOUS HCL 2 % MT SOLN
15.0000 mL | Freq: Once | OROMUCOSAL | Status: AC
  Administered 2021-04-29: 15 mL via ORAL
  Filled 2021-04-29: qty 15

## 2021-04-29 NOTE — ED Provider Notes (Signed)
[facility] EMERGENCY DEPARTMENT Provider Note   CSN: [id number] Arrival date & time: 04/29/21  1049     History Chief Complaint  Patient presents with   Chest Pain    Jeanne [name] is a 85 y.o. female.   Chest Pain Pain location:  L chest Pain quality: burning   Pain radiates to:  L arm Pain severity:  Mild Progression:  Resolved Chronicity:  New Context: at rest   Relieved by:  None tried Worsened by:  Nothing Associated symptoms: heartburn   Associated symptoms: no abdominal pain, no altered mental status, no back pain, no cough, no diaphoresis, no fatigue, no fever, no lower extremity edema, no nausea, no numbness, no palpitations, no shortness of breath, no syncope, no vomiting and no weakness   Risk factors: coronary artery disease   Risk factors: not female    85 year old female with a history of nonobstructive CAD, hypertension, hyperlipidemia, type 2 DM, OSA,  IgG kappa monoclonal gammopathy who presents emergency department with chest pain.  The patient states that her chest pain came on yesterday, described as almost like a burning sensation in her left upper chest, similar to her prior episodes of GERD.  She states that the pain resolved overnight but came back today.  She does endorse some dullness to the pain with some radiation to her left arm.  She states that her pain is subsequently resolved since her arrival to the emergency department.  She denies any shortness of breath.  She denies any diaphoresis.  She denies any abdominal pain, nausea, vomiting, diarrhea or dysuria.  Past Medical History:  Diagnosis Date   Anemia    Arthritis    Carotid arterial disease ([place])    COVID-19    Diabetes mellitus    Hyperlipidemia    Hypertension    Migraine    Obesity    Reflux    Sleep apnea     Patient Active Problem List   Diagnosis Date Noted   Chronic diastolic heart failure ([place]) 10/07/2020   Moderate nonproliferative diabetic  retinopathy of both eyes ([place]) 05/25/2020   Chorioretinal scar 05/25/2020   Left epiretinal membrane 05/25/2020   Posterior vitreous detachment of both eyes 05/25/2020   Acute respiratory failure with hypoxia ([place]) 10/11/2019   CAP (community acquired pneumonia) 89/37/3428   Diastolic dysfunction, left ventricle 04/05/2019   Coronary artery disease of native artery of native heart with stable angina pectoris ([place]) 11/23/2018   Hypercholesteremia 11/23/2018   Unstable angina (H[facility]) 10/12/2015   Abnormal myocardial perfusion study 10/12/2015   NSVT (nonsustained ventricular tachycardia) ([place]) 10/11/2015   Primary hypertension 10/11/2015   CKD (chronic kidney disease), stage III ([place]) 10/11/2015   Pain in the chest    Chest pain 10/08/2015   Diabetes mellitus type II, non insulin dependent ([place]) 10/08/2015   AKI (acute kidney injury) ([place])    Abscess of left arm 01/01/2013   Migraine    Morbid obesity ([place])    Sleep apnea    Stenosis of left carotid artery    Reflux    Anemia     Past Surgical History:  Procedure Laterality Date   CARDIAC CATHETERIZATION N/A 10/12/2015   Procedure: Left Heart Cath and Coronary Angiography;  Surgeon: Belva Crome, MD;  Location: [facility];  Service: Cardiovascular;  Laterality: N/A;   CHOLECYSTECTOMY  50 yrs ago   JOINT REPLACEMENT Right 2005   knee   WISDOM TOOTH EXTRACTION  OB History   No obstetric history on file.     Family History  Problem Relation Age of Onset   Heart attack Father    Heart attack Mother     Social History   Tobacco Use   Smoking status: Never   Smokeless tobacco: Never  Vaping Use   Vaping Use: Never used  Substance Use Topics   Alcohol use: Never   Drug use: No    Home Medications Prior to Admission medications   Medication Sig Start Date End Date Taking? Authorizing Provider  allopurinol (ZYLOPRIM) 100 MG tablet Take 100 mg by mouth daily. 05/27/20   [provider]  aspirin EC  81 MG tablet Take 81 mg by mouth daily.    [provider]  Cholecalciferol (VITAMIN D3) 50 MCG (2000 UT) TABS Take 2,000 Units by mouth daily.  07/30/19   [provider]  diltiazem (TIAZAC) 120 MG 24 hr capsule Take 240 mg by mouth daily. 09/08/19   [provider]  ezetimibe (ZETIA) 10 MG tablet Take 10 mg by mouth daily.    [provider]  ferrous sulfate 325 (65 FE) MG tablet Take 325 mg by mouth daily with breakfast.  07/30/19   [provider]  furosemide (LASIX) 20 MG tablet TAKE 1 TABLET BY MOUTH EVERY DAY 04/14/21   Patwardhan, Manish J, MD  hydrALAZINE (APRESOLINE) 25 MG tablet TAKE 1 TABLET BY MOUTH THREE TIMES A DAY Patient taking differently: Take 25 mg by mouth 3 (three) times daily. 07/14/20   Patwardhan, Manish J, MD  insulin aspart (NOVOLOG) 100 UNIT/ML injection 0-9 Units, Subcutaneous, 3 times daily with meals CBG < 70: Implement Hypoglycemia measures/Call MD CBG 70 - 120: 0 units CBG 121 - 150: 1 unit CBG 151 - 200: 2 units CBG 201 - 250: 3 units CBG 251 - 300: 5 units CBG 301 - 350: 7 units CBG 351 - 400: 9 units CBG > 400: call MD Patient not taking: No sig reported 10/17/19   Ghimire, Henreitta Leber, MD  insulin glargine (LANTUS) 100 UNIT/ML injection Inject 65 Units into the skin daily before breakfast.    [provider]  isosorbide mononitrate (IMDUR) 30 MG 24 hr tablet Take 1 tablet (30 mg total) by mouth daily. 07/08/20 10/06/20  Patwardhan, Reynold Bowen, MD  latanoprost (XALATAN) 0.005 % ophthalmic solution INSTILL 1 DROP IN LEFT EYE NIGHTLY Patient taking differently: Place 1 drop into the left eye at bedtime. 12/01/20   Rankin, Clent Demark, MD  losartan (COZAAR) 25 MG tablet TAKE 2 TABLETS BY MOUTH DAILY 04/14/21   Patwardhan, Reynold Bowen, MD  meclizine (ANTIVERT) 12.5 MG tablet Take 1 tablet (12.5 mg total) by mouth 3 (three) times daily as needed for dizziness. Patient taking differently: Take 12.5 mg by mouth 2 (two) times  daily. 08/31/20   Cardama, Grayce Sessions, MD  nitroGLYCERIN (NITROSTAT) 0.4 MG SL tablet Place 1 tablet (0.4 mg total) under the tongue as needed for chest pain. 09/08/19   Miquel Dunn, NP  oxybutynin (DITROPAN-XL) 10 MG 24 hr tablet Take 10 mg by mouth daily.    [provider]  oxyCODONE (ROXICODONE) 5 MG immediate release tablet Take 0.5 tablets (2.5 mg total) by mouth every 6 (six) hours as needed for severe pain or breakthrough pain. 12/12/20   Robinson, [place] N, PA-C  pantoprazole (PROTONIX) 40 MG tablet Take 40 mg by mouth daily before breakfast.  08/25/15   [provider]  Polyvinyl Alcohol-Povidone ([place]  OP) Place 1 drop into both eyes at bedtime.    [provider]    Allergies    Amoxicillin and Invokana [canagliflozin]  Review of Systems   Review of Systems  Constitutional:  Negative for diaphoresis, fatigue and fever.  Respiratory:  Negative for cough and shortness of breath.   Cardiovascular:  Positive for chest pain. Negative for palpitations and syncope.  Gastrointestinal:  Positive for heartburn. Negative for abdominal pain, nausea and vomiting.  Musculoskeletal:  Negative for back pain.  Neurological:  Negative for weakness and numbness.  All other systems reviewed and are negative.  Physical Exam Updated Vital Signs BP (!) 149/81   Pulse 83   Temp 99 F (37.2 C) (Oral)   Resp 20   Ht 5\' 2"  (1.575 m)   Wt (!) 144.7 kg   SpO2 95%   BMI 58.35 kg/m   Physical Exam Vitals and nursing note reviewed.  Constitutional:      General: She is not in acute distress.    Appearance: She is well-developed.  HENT:     Head: Normocephalic and atraumatic.  Eyes:     Conjunctiva/sclera: Conjunctivae normal.  Cardiovascular:     Rate and Rhythm: Normal rate and regular rhythm.     Heart sounds: No murmur heard. Pulmonary:     Effort: Pulmonary effort is normal. No respiratory distress.     Breath sounds: Normal breath sounds.   Abdominal:     Palpations: Abdomen is soft.     Tenderness: There is no abdominal tenderness.  Musculoskeletal:     Cervical back: Neck supple.     Right lower leg: No edema.     Left lower leg: No edema.  Skin:    General: Skin is warm and dry.  Neurological:     General: No focal deficit present.     Mental Status: She is alert and oriented to person, place, and time.    ED Results / Procedures / Treatments   Labs (all labs ordered are listed, but only abnormal results are displayed) Labs Reviewed  BASIC METABOLIC PANEL - Abnormal; Notable for the following components:      Result Value   Glucose, Bld 186 (*)    Creatinine, Ser 1.63 (*)    GFR, Estimated 31 (*)    All other components within normal limits  CBC - Abnormal; Notable for the following components:   Hemoglobin 11.1 (*)    All other components within normal limits  TROPONIN I (HIGH SENSITIVITY)  TROPONIN I (HIGH SENSITIVITY)    EKG EKG Interpretation  Date/Time:  Friday April 29 2021 10:58:21 EDT Ventricular Rate:  86 PR Interval:  162 QRS Duration: 86 QT Interval:  366 QTC Calculation: 437 R Axis:   -59 Text Interpretation: Sinus rhythm with occasional Premature ventricular complexes Left axis deviation Low voltage QRS Cannot rule out Anterior infarct , age undetermined Abnormal ECG Confirmed by Regan Lemming ([id number]) on 04/29/2021 12:02:34 PM  Radiology DG Chest 2 View  Result Date: 04/29/2021 CLINICAL DATA:  Chest pain EXAM: CHEST - 2 VIEW COMPARISON:  12/12/2020 FINDINGS: The mediastinal contours are within normal limits. Unchanged mild cardiomegaly. Atherosclerotic calcification of the aortic arch. The lungs are clear bilaterally without evidence of focal consolidation, pleural effusion, or pneumothorax. No acute osseous abnormality. IMPRESSION: No acute cardiopulmonary process. Unchanged mild cardiomegaly. Aortic Atherosclerosis (ICD10-I70.0). Electronically Signed   By: Ruthann Cancer M.D.   On:  04/29/2021 11:38    Procedures Procedures   Medications  Ordered in ED Medications  alum & mag hydroxide-simeth (MAALOX/MYLANTA) 200-200-20 MG/5ML suspension 30 mL (30 mLs Oral Given 04/29/21 1424)    And  lidocaine (XYLOCAINE) 2 % viscous mouth solution 15 mL (15 mLs Oral Given 04/29/21 1424)    ED Course  I have reviewed the triage vital signs and the nursing notes.  Pertinent labs & imaging results that were available during my care of the patient were reviewed by me and considered in my medical decision making (see chart for details).    MDM Rules/Calculators/A&P HEAR Score: 4                         GLENETTE BOOKWALTER is a 85 y.o. female who presented to the Emergency Department c/o chest pain. Past medical records have been reviewed and are notable for nonobstructive CAD with a last cardiac cath in 2017.   Pertinent exam findings include:well appearing, NAD, lungs CTAB, no peripheral edema.  EKG: Normal sinus rhythm with a rate of 86 and no evidence of acute ischemic changes, abnormal intervals, or dysrhythmia. No concerning change from prior  Lab results include: Troponins x2 negative, CBC without a leukocytosis, stable anemia globin 11, no significant electrolyte abnormality.  Imaging results include: Chest x-ray without acute cardiac or pulmonary abnormality.  Course of tx has consisted of: Malox & viscous lidocaine  Thought process: Patient is an 85 year old female with a history of nonobstructive CAD with a last cath in 2017, follows outpatient with cardiology presenting with atypical chest pain symptoms, most likely consistent with prior episodes of GERD.  Symptoms are since resolved on arrival to the emergency department.  Differential diagnosis includes: ACS, pneumonia, pneumothorax, pericarditis/myocarditis, GERD, PUD, musculoskeletal. HEART score of 4, heart rate risk.   Labs unremarkable.  Unlikely pneumonia, no cough, no leukocytosis, no fevers, CXR and exam  without acute findings. Unlikely pneumothorax, no findings on  CXR. Unlikely pericarditis/myocarditis, does not fit clinical picture. Chest pain not exertional. Unlikely dissection, no pulse deficit, no tearing chest pain, no neurologic complaints. Due to patient's high risk will rule out ACS.   Patient's clinical presentation is most consistent with GERD.  Delta troponins were negative.  The patient had nonischemic EKG findings.  She has had complete resolution of her chest pain and is overall well-appearing in no apparent distress.  She denies any complaints on reassessment.  Overall, symptoms are consistent with likely GERD.  Given her history of nonobstructive CAD with last cath in 2017, I advised that the patient follow-up with her cardiologist for consideration for outpatient stress testing.  The patient was in agreement with this plan.  Final Clinical Impression(s) / ED Diagnoses Final diagnoses:  Chest pain, unspecified type  Gastroesophageal reflux disease, unspecified whether esophagitis present    Rx / DC Orders ED Discharge Orders     None        Regan Lemming, MD 04/29/21 2124

## 2021-04-29 NOTE — ED Triage Notes (Signed)
Pt endorses L sided CP radiating to L arm since last night. No SOB per patient.

## 2021-04-29 NOTE — Discharge Instructions (Addendum)
Please ensure you follow-up with your cardiologist for stress testing.Your cardiac enzymes and EKG were reassuring today.

## 2021-04-29 NOTE — ED Notes (Signed)
Pt verbalizes understanding of discharge instructions. Opportunity for questions and answers were provided. Pt discharged from the ED.   ?

## 2021-05-02 ENCOUNTER — Encounter: Payer: Self-pay | Admitting: Cardiology

## 2021-05-02 ENCOUNTER — Other Ambulatory Visit: Payer: Self-pay

## 2021-05-02 ENCOUNTER — Ambulatory Visit: Payer: Medicare Other | Admitting: Cardiology

## 2021-05-02 VITALS — BP 133/70 | HR 105 | Temp 98.0°F | Resp 16 | Ht 62.0 in | Wt 326.0 lb

## 2021-05-02 DIAGNOSIS — K219 Gastro-esophageal reflux disease without esophagitis: Secondary | ICD-10-CM | POA: Insufficient documentation

## 2021-05-02 DIAGNOSIS — R0789 Other chest pain: Secondary | ICD-10-CM

## 2021-05-02 DIAGNOSIS — I25118 Atherosclerotic heart disease of native coronary artery with other forms of angina pectoris: Secondary | ICD-10-CM

## 2021-05-02 MED ORDER — ESOMEPRAZOLE MAGNESIUM 20 MG PO CPDR: 20.0000 mg | DELAYED_RELEASE_CAPSULE | Freq: Every day | ORAL | 1 refills | Status: DC

## 2021-05-02 NOTE — Progress Notes (Signed)
Follow up visit  Subjective:   Jeanne [name], female    DOB: 1936/07/12, 85 y.o.   MRN: [id number]     HPI   Chief Complaint  Patient presents with   Coronary artery disease of native artery of native heart wi   Follow-up    71 month    85 year old African-American female with nonobstructive CAD, hypertension, hyperlipidemia, type 2 DM, OSA,  IgG kappa monoclonal gammopathy   Patient recently took all her pills at once. Since then, she has started feeling retrosternal burning sensation, that improves with burping. Symptoms are not related to exertion. She was recently seen in ER for these complaints, ACS was excluded.   Current Outpatient Medications on File Prior to Visit  Medication Sig Dispense Refill   allopurinol (ZYLOPRIM) 100 MG tablet Take 100 mg by mouth daily.     aspirin EC 81 MG tablet Take 81 mg by mouth daily.     Cholecalciferol (VITAMIN D3) 50 MCG (2000 UT) TABS Take 2,000 Units by mouth daily.      diltiazem (TIAZAC) 120 MG 24 hr capsule Take 240 mg by mouth daily.     ezetimibe (ZETIA) 10 MG tablet Take 10 mg by mouth daily.     ferrous sulfate 325 (65 FE) MG tablet Take 325 mg by mouth daily with breakfast.      furosemide (LASIX) 20 MG tablet TAKE 1 TABLET BY MOUTH EVERY DAY 90 tablet 1   hydrALAZINE (APRESOLINE) 25 MG tablet TAKE 1 TABLET BY MOUTH THREE TIMES A DAY (Patient taking differently: Take 25 mg by mouth 3 (three) times daily.) 270 tablet 3   insulin aspart (NOVOLOG) 100 UNIT/ML injection 0-9 Units, Subcutaneous, 3 times daily with meals CBG < 70: Implement Hypoglycemia measures/Call MD CBG 70 - 120: 0 units CBG 121 - 150: 1 unit CBG 151 - 200: 2 units CBG 201 - 250: 3 units CBG 251 - 300: 5 units CBG 301 - 350: 7 units CBG 351 - 400: 9 units CBG > 400: call MD (Patient not taking: No sig reported) 10 mL 11   insulin glargine (LANTUS) 100 UNIT/ML injection Inject 65 Units into the skin daily before breakfast.     isosorbide mononitrate  (IMDUR) 30 MG 24 hr tablet Take 1 tablet (30 mg total) by mouth daily. 90 tablet 3   latanoprost (XALATAN) 0.005 % ophthalmic solution INSTILL 1 DROP IN LEFT EYE NIGHTLY (Patient taking differently: Place 1 drop into the left eye at bedtime.) 7.5 mL 12   losartan (COZAAR) 25 MG tablet TAKE 2 TABLETS BY MOUTH DAILY 180 tablet 2   meclizine (ANTIVERT) 12.5 MG tablet Take 1 tablet (12.5 mg total) by mouth 3 (three) times daily as needed for dizziness. (Patient taking differently: Take 12.5 mg by mouth 2 (two) times daily.) 30 tablet 0   nitroGLYCERIN (NITROSTAT) 0.4 MG SL tablet Place 1 tablet (0.4 mg total) under the tongue as needed for chest pain. 25 tablet 3   oxybutynin (DITROPAN-XL) 10 MG 24 hr tablet Take 10 mg by mouth daily.     oxyCODONE (ROXICODONE) 5 MG immediate release tablet Take 0.5 tablets (2.5 mg total) by mouth every 6 (six) hours as needed for severe pain or breakthrough pain. 6 tablet 0   pantoprazole (PROTONIX) 40 MG tablet Take 40 mg by mouth daily before breakfast.      Polyvinyl Alcohol-Povidone ([place] OP) Place 1 drop into both eyes at bedtime.     No current facility-administered medications  on file prior to visit.    Cardiovascular & other pertient studies:  CT Chest 06/09/2020: No evidence of parenchymal nodule to correspond with the findings of recent chest x-ray. The finding seen on recent chest x-ray are related to tortuosity of the vasculature as well exuberant degenerative changes in the thoracic spine and ribcage.   Small sliding-type hiatal hernia.   Mild thickening of the pericardium greater than that seen on the prior exam which may represent a small effusion. The need for further follow-up can be determined on a clinical basis.   Aortic Atherosclerosis (ICD10-I70.0).  EKG 06/03/2020: Sinus rhythm 82 bpm  Left anterior fascicular block Nonspecific T-abnormality   Coronary angiogram 2017: Prox LAD to Mid LAD lesion, 50% stenosed. Ost 1st Diag to  1st Diag lesion, 70% stenosed. Ost 2nd Diag to 2nd Diag lesion, 75% stenosed. Dist Cx lesion, 40% stenosed. Prox RCA to Dist RCA lesion, 30% stenosed.   Mild to moderate obstructive coronary disease involving the mid LAD, the first and second diagonal branches, the distal circumflex, and diffusely throughout the mid RCA. No clinically significant obstruction is identified especially with reference to the perfusion abnormalities noted on radionuclide scintigraphy. Previously documented normal left ventricular function. Normal left ventricular hemodynamics were identified with an end-diastolic pressure of 13 mmHg.     Recommendations:   Consider alternative explanations for the patient's chest discomfort Disposition is per internal medicine.    Echocardiogram 04/01/2019:  1. The left ventricle has normal systolic function with an ejection  fraction of 60-65%. The cavity size was normal. There is mildly increased  left ventricular wall thickness. Left ventricular diastolic Doppler  parameters are consistent with impaired  relaxation. Elevated left ventricular end-diastolic pressure The E/e' is  >15. No evidence of left ventricular regional wall motion abnormalities.   2. The right ventricle has mildly reduced systolic function. The cavity  was moderately enlarged. There is no increase in right ventricular wall  thickness.   3. The aortic valve is grossly normal.   4. The aorta is normal in size and structure.   10/2018: Normal examination. No evidence of hemodynamically significant peripheral arterial disease.   Signed,  Recent labs: 08/30/2020: Glucose 99, BUN/Cr 33/1.98. EGFR 25. Na/K 139/3.8.  H/H 11/36. MCV 86. Platelets 255  06/09/2020: Glucose 158, BUN/Cr 20/1.79. EGFR N/A. Na/K 140/2.9. Rest of the CMP normal H/H 10/33. MCV 87. Platelets 247  01/27/2020: Glucose 250, BUN/Cr 22/1.63. EGFR 34. Na/K 140/3.5. Rest of the CMP normal H/H 12/37. MCV 85. Platelets  200  03/30/2020: Chol 92, TG 77, HDL 32, LDL 45   Review of Systems  Cardiovascular:  Positive for chest pain. Negative for dyspnea on exertion, leg swelling, palpitations and syncope.        Vitals:   05/02/21 1417  BP: 133/70  Pulse: (!) 105  Resp: 16  Temp: 98 F (36.7 C)  SpO2: 93%     Body mass index is 59.63 kg/m. Filed Weights   05/02/21 1417  Weight: (!) 326 lb (147.9 kg)     Objective:   Physical Exam Vitals and nursing note reviewed.  Constitutional:      General: She is not in acute distress.    Appearance: She is obese.  Neck:     Vascular: No JVD.  Cardiovascular:     Rate and Rhythm: Normal rate and regular rhythm.     Heart sounds: Normal heart sounds. No murmur heard. Pulmonary:     Effort: Pulmonary effort is normal.  Breath sounds: Normal breath sounds. No wheezing or rales.  Musculoskeletal:     Right lower leg: No edema.     Left lower leg: No edema.          Assessment & Recommendations:   85 year old African-American female with nonobstructive CAD, hypertension, hyperlipidemia, type 2 DM, OSA,  IgG kappa monoclonal gammopathy   Chest pain: Appears to be related to GERD. For now, I have stopped his Aspirin and changed pantoprazole to esmoprazole. Will check echocardiogram. If symptoms don't improve, will then check stress test in hte hospital, owing to body habitus.   Hypertension: Controlled  F/u in 4 weeks   Nigel Mormon, MD Pager: [phone] Office: [phone]

## 2021-05-04 ENCOUNTER — Telehealth: Payer: Self-pay

## 2021-05-04 NOTE — Telephone Encounter (Signed)
Pt would like to know why she has to take her esomeprazole at 12 in the afternoon. Pt stated she already took her pantoprazole because she didn't feel comfortable taking her esomeprazole until she know why she had to take it at noon. She would also like to know if she can still take it today. I let her know that the pantoprazole was discontinued at the last office visit. Please advise.

## 2021-05-04 NOTE — Telephone Encounter (Signed)
Patient called back she is aware

## 2021-05-04 NOTE — Telephone Encounter (Signed)
Called patient, NA, LMAM

## 2021-05-04 NOTE — Telephone Encounter (Signed)
Does not have to be at 12. I apologize if the prescription specifically said that. Esmoprazole was to replace her pantoprazole.  Thanks MJP

## 2021-05-12 ENCOUNTER — Other Ambulatory Visit: Payer: Medicare Other

## 2021-05-19 ENCOUNTER — Ambulatory Visit: Payer: Medicare Other | Admitting: Cardiology

## 2021-05-24 ENCOUNTER — Other Ambulatory Visit: Payer: Self-pay | Admitting: Cardiology

## 2021-05-24 DIAGNOSIS — R0789 Other chest pain: Secondary | ICD-10-CM

## 2021-05-25 ENCOUNTER — Ambulatory Visit: Payer: Medicare Other | Admitting: Cardiology

## 2021-05-26 ENCOUNTER — Encounter (INDEPENDENT_AMBULATORY_CARE_PROVIDER_SITE_OTHER): Payer: Medicare Other | Admitting: Ophthalmology

## 2021-05-27 ENCOUNTER — Ambulatory Visit: Payer: Medicare Other

## 2021-05-27 ENCOUNTER — Other Ambulatory Visit: Payer: Self-pay

## 2021-05-27 DIAGNOSIS — R0789 Other chest pain: Secondary | ICD-10-CM

## 2021-06-08 ENCOUNTER — Ambulatory Visit: Payer: Medicare Other | Admitting: Cardiology

## 2021-06-08 ENCOUNTER — Other Ambulatory Visit: Payer: Self-pay

## 2021-06-08 ENCOUNTER — Encounter: Payer: Self-pay | Admitting: Cardiology

## 2021-06-08 VITALS — BP 128/77 | HR 96 | Temp 97.4°F | Resp 16 | Ht 62.0 in | Wt 321.4 lb

## 2021-06-08 DIAGNOSIS — I209 Angina pectoris, unspecified: Secondary | ICD-10-CM

## 2021-06-08 MED ORDER — NITROGLYCERIN 0.4 MG SL SUBL: 0.4000 mg | SUBLINGUAL_TABLET | SUBLINGUAL | 3 refills | Status: DC | PRN

## 2021-06-08 NOTE — Progress Notes (Signed)
Follow up visit  Subjective:   Jeanne [name], female    DOB: August 28, 1936, 85 y.o.   MRN: [id number]     HPI   Chief Complaint  Patient presents with   Chest Pain   Follow-up    3-4 weeks    85 year old African-American female with nonobstructive CAD, hypertension, hyperlipidemia, type 2 DM, OSA,  IgG kappa monoclonal gammopathy   Patient has only occasional episodes of chest pain.  They do seem to occur with physical exertion, improved with rest.  She has had only about 2 episodes in 1 month.    Current Outpatient Medications on File Prior to Visit  Medication Sig Dispense Refill   allopurinol (ZYLOPRIM) 100 MG tablet Take 100 mg by mouth daily.     Cholecalciferol (VITAMIN D3) 50 MCG (2000 UT) TABS Take 2,000 Units by mouth daily.      diltiazem (TIAZAC) 120 MG 24 hr capsule Take 240 mg by mouth daily.     esomeprazole (NEXIUM) 20 MG capsule TAKE 1 CAPSULE (20 MG TOTAL) BY MOUTH DAILY AT 12 NOON. 90 capsule 1   ezetimibe (ZETIA) 10 MG tablet Take 10 mg by mouth daily.     ferrous sulfate 325 (65 FE) MG tablet Take 325 mg by mouth daily with breakfast.      furosemide (LASIX) 20 MG tablet TAKE 1 TABLET BY MOUTH EVERY DAY 90 tablet 1   hydrALAZINE (APRESOLINE) 25 MG tablet TAKE 1 TABLET BY MOUTH THREE TIMES A DAY (Patient taking differently: Take 25 mg by mouth 3 (three) times daily.) 270 tablet 3   insulin aspart (NOVOLOG) 100 UNIT/ML injection 0-9 Units, Subcutaneous, 3 times daily with meals CBG < 70: Implement Hypoglycemia measures/Call MD CBG 70 - 120: 0 units CBG 121 - 150: 1 unit CBG 151 - 200: 2 units CBG 201 - 250: 3 units CBG 251 - 300: 5 units CBG 301 - 350: 7 units CBG 351 - 400: 9 units CBG > 400: call MD 10 mL 11   insulin glargine (LANTUS) 100 UNIT/ML injection Inject 65 Units into the skin daily before breakfast.     isosorbide mononitrate (IMDUR) 30 MG 24 hr tablet Take 1 tablet (30 mg total) by mouth daily. 90 tablet 3   latanoprost (XALATAN) 0.005  % ophthalmic solution INSTILL 1 DROP IN LEFT EYE NIGHTLY (Patient taking differently: Place 1 drop into the left eye at bedtime.) 7.5 mL 12   losartan (COZAAR) 25 MG tablet TAKE 2 TABLETS BY MOUTH DAILY 180 tablet 2   meclizine (ANTIVERT) 12.5 MG tablet Take 1 tablet (12.5 mg total) by mouth 3 (three) times daily as needed for dizziness. (Patient taking differently: Take 12.5 mg by mouth 2 (two) times daily.) 30 tablet 0   nitroGLYCERIN (NITROSTAT) 0.4 MG SL tablet Place 1 tablet (0.4 mg total) under the tongue as needed for chest pain. 25 tablet 3   oxybutynin (DITROPAN-XL) 10 MG 24 hr tablet Take 10 mg by mouth daily.     Polyvinyl Alcohol-Povidone (REFRESH OP) Place 1 drop into both eyes at bedtime.     No current facility-administered medications on file prior to visit.    Cardiovascular & other pertient studies:  Echocardiogram 05/27/2021:  Poor echo window and foreshortened views due to patient body habitus.  Measurements may not be accurate.  Mildly depressed LV systolic function with EF 50%. Left ventricle cavity  is normal in size. Severe concentric hypertrophy of the left ventricle.  Normal global wall motion. Doppler  evidence of grade II (pseudonormal)  diastolic dysfunction, elevated LAP. Calculated EF 50%.  Left atrial cavity is moderately dilated.  Right atrial cavity is mildly dilated.  Right ventricle cavity is moderately dilated. Mildly reduced right  ventricular function.  Mild (Grade I) mitral regurgitation.  Moderate tricuspid regurgitation. Mild pulmonary hypertension. RVSP  measures 39 mmHg.  No significnat change since 06/18/2018.  CT Chest 06/09/2020: No evidence of parenchymal nodule to correspond with the findings of recent chest x-ray. The finding seen on recent chest x-ray are related to tortuosity of the vasculature as well exuberant degenerative changes in the thoracic spine and ribcage.   Small sliding-type hiatal hernia.   Mild thickening of the  pericardium greater than that seen on the prior exam which may represent a small effusion. The need for further follow-up can be determined on a clinical basis.   Aortic Atherosclerosis (ICD10-I70.0).  EKG 06/03/2020: Sinus rhythm 82 bpm  Left anterior fascicular block Nonspecific T-abnormality   Coronary angiogram 2017: Prox LAD to Mid LAD lesion, 50% stenosed. Ost 1st Diag to 1st Diag lesion, 70% stenosed. Ost 2nd Diag to 2nd Diag lesion, 75% stenosed. Dist Cx lesion, 40% stenosed. Prox RCA to Dist RCA lesion, 30% stenosed.   Mild to moderate obstructive coronary disease involving the mid LAD, the first and second diagonal branches, the distal circumflex, and diffusely throughout the mid RCA. No clinically significant obstruction is identified especially with reference to the perfusion abnormalities noted on radionuclide scintigraphy. Previously documented normal left ventricular function. Normal left ventricular hemodynamics were identified with an end-diastolic pressure of 13 mmHg.     Recommendations:   Consider alternative explanations for the patient's chest discomfort Disposition is per internal medicine.    10/2018: Normal examination. No evidence of hemodynamically significant peripheral arterial disease.   Signed,  Recent labs: 04/29/2021: Glucose 186, BUN/Cr 19/1.63. EGFR 31. Na/K 137/4.0.  H/H 11/36. MCV 90. Platelets 237 Trop HS 14, 15  08/30/2020: Glucose 99, BUN/Cr 33/1.98. EGFR 25. Na/K 139/3.8.  H/H 11/36. MCV 86. Platelets 255  06/09/2020: Glucose 158, BUN/Cr 20/1.79. EGFR N/A. Na/K 140/2.9. Rest of the CMP normal H/H 10/33. MCV 87. Platelets 247  01/27/2020: Glucose 250, BUN/Cr 22/1.63. EGFR 34. Na/K 140/3.5. Rest of the CMP normal H/H 12/37. MCV 85. Platelets 200  03/30/2020: Chol 92, TG 77, HDL 32, LDL 45   Review of Systems  Cardiovascular:  Positive for chest pain. Negative for dyspnea on exertion, leg swelling, palpitations and syncope.         Vitals:   06/08/21 1306  BP: (!) 166/84  Pulse: 100  Resp: 16  Temp: (!) 97.4 F (36.3 C)  SpO2: 98%     Body mass index is 58.78 kg/m. Filed Weights   06/08/21 1306  Weight: (!) 321 lb 6.4 oz (145.8 kg)     Objective:   Physical Exam Vitals and nursing note reviewed.  Constitutional:      General: She is not in acute distress.    Appearance: She is obese.  Neck:     Vascular: No JVD.  Cardiovascular:     Rate and Rhythm: Normal rate and regular rhythm.     Heart sounds: Normal heart sounds. No murmur heard. Pulmonary:     Effort: Pulmonary effort is normal.     Breath sounds: Normal breath sounds. No wheezing or rales.  Musculoskeletal:     Right lower leg: No edema.     Left lower leg: No edema.  Assessment & Recommendations:   85 year old African-American female with nonobstructive CAD, hypertension, hyperlipidemia, type 2 DM, OSA,  IgG kappa monoclonal gammopathy   Chest pain: Symptoms are suggestive of angina, but infrequent and frequency.  Stress testing will be low yield given her body habitus.  Coronary angiogram and consideration for intervention can be performed, if symptoms are severe and infrequent.  This has to be weighed with risk of contrast-induced nephropathy in the setting of her EGFR in low 30s.  At this time, I have recommended continued medical management with diltiazem and Imdur.  Also refill this as needed sublingual nitroglycerin.  We will revisit this discussion in 4 weeks.  Hypertension: Controlled  F/u in 4 weeks   Nigel Mormon, MD Pager: [phone] Office: [phone]

## 2021-06-21 ENCOUNTER — Encounter (INDEPENDENT_AMBULATORY_CARE_PROVIDER_SITE_OTHER): Payer: Medicare Other | Admitting: Ophthalmology

## 2021-07-06 ENCOUNTER — Ambulatory Visit: Payer: Self-pay | Admitting: Cardiology

## 2021-07-06 NOTE — Progress Notes (Signed)
No show

## 2021-11-01 ENCOUNTER — Other Ambulatory Visit: Payer: Self-pay

## 2021-11-01 ENCOUNTER — Emergency Department (HOSPITAL_COMMUNITY): Payer: Medicare Other

## 2021-11-01 ENCOUNTER — Encounter (HOSPITAL_COMMUNITY): Payer: Self-pay

## 2021-11-01 ENCOUNTER — Emergency Department (HOSPITAL_COMMUNITY)
Admission: EM | Admit: 2021-11-01 | Discharge: 2021-11-01 | Disposition: A | Discharge status: Home / Self Care | Payer: Medicare Other | Attending: Emergency Medicine | Admitting: Emergency Medicine

## 2021-11-01 DIAGNOSIS — R002 Palpitations: Secondary | ICD-10-CM | POA: Insufficient documentation

## 2021-11-01 DIAGNOSIS — R0602 Shortness of breath: Secondary | ICD-10-CM | POA: Diagnosis not present

## 2021-11-01 DIAGNOSIS — R079 Chest pain, unspecified: Secondary | ICD-10-CM

## 2021-11-01 DIAGNOSIS — Z794 Long term (current) use of insulin: Secondary | ICD-10-CM | POA: Insufficient documentation

## 2021-11-01 LAB — CBC
HCT: 37.3 % (ref 36.0–46.0)
Hemoglobin: 11.9 g/dL — ABNORMAL LOW (ref 12.0–15.0)
MCH: 27.9 pg (ref 26.0–34.0)
MCHC: 31.9 g/dL (ref 30.0–36.0)
MCV: 87.6 fL (ref 80.0–100.0)
Platelets: 256 10*3/uL (ref 150–400)
RBC: 4.26 MIL/uL (ref 3.87–5.11)
RDW: 14.3 % (ref 11.5–15.5)
WBC: 8.4 10*3/uL (ref 4.0–10.5)
nRBC: 0 % (ref 0.0–0.2)

## 2021-11-01 LAB — TROPONIN I (HIGH SENSITIVITY)
Troponin I (High Sensitivity): 15 ng/L (ref <18)
Troponin I (High Sensitivity): 17 ng/L (ref <18)

## 2021-11-01 LAB — BASIC METABOLIC PANEL
Anion gap: 9 (ref 5–15)
BUN: 23 mg/dL (ref 8–23)
CO2: 27 mmol/L (ref 22–32)
Calcium: 9.4 mg/dL (ref 8.9–10.3)
Chloride: 101 mmol/L (ref 98–111)
Creatinine, Ser: 1.81 mg/dL — ABNORMAL HIGH (ref 0.44–1.00)
GFR, Estimated: 27 mL/min — ABNORMAL LOW (ref >60)
Glucose, Bld: 123 mg/dL — ABNORMAL HIGH (ref 70–99)
Potassium: 3.8 mmol/L (ref 3.5–5.1)
Sodium: 137 mmol/L (ref 135–145)

## 2021-11-01 NOTE — ED Provider Triage Note (Signed)
Emergency Medicine Provider Triage Evaluation Note  Jeanne [name] , a 86 y.o. female  was evaluated in triage.  Pt complains of diffuse chest pain that started Monday while she was exerting herself cleaning the house.  Took 2 nitro which slightly improved.  Still having pain now complaining of shortness of breath and palpitations.  Review of Systems  Positive:  Negative: See above   Physical Exam  BP 129/60 (BP Location: Right Arm)    Pulse 88    Temp 99 F (37.2 C) (Oral)    Resp 20    Ht 5\' 2"  (1.575 m)    Wt (!) 145.8 kg    SpO2 93%    BMI 58.79 kg/m  Gen:   Awake, no distress   Resp:  Normal effort  MSK:   Moves extremities without difficulty  Other:    Medical Decision Making  Medically screening exam initiated at 5:54 PM.  Appropriate orders placed.  ALEXANDRYA CHIM was informed that the remainder of the evaluation will be completed by another provider, this initial triage assessment does not replace that evaluation, and the importance of remaining in the ED until their evaluation is complete.     Myna Bright [place], [place] 11/01/21 1757

## 2021-11-01 NOTE — Discharge Instructions (Signed)
Follow-up with your primary care doctor to discuss your symptoms from today.  Come back to ER if you develop any recurrent chest pain, difficulty breathing or other new concerning symptom.

## 2021-11-01 NOTE — ED Triage Notes (Signed)
Pt arrived via POV for c/o 6/10 throbbing pain across entire chest that radiates down left arm started last night. Pt c/o of some SOB, but denies N/V, dizziness. Pt able to speak in complete sentences.

## 2021-11-01 NOTE — ED Provider Notes (Signed)
[facility] EMERGENCY DEPARTMENT Provider Note   CSN: [id number] Arrival date & time: 11/01/21  1711     History  Chief Complaint  Patient presents with   Chest Pain    Jeanne [name] is a 86 y.o. female.  Presented to the emergency department with concern for chest pain.  Patient states that she had an episode of chest pain a couple days ago while cleaning the house.  Pain went away, today had another episode of chest pain.  Pain lasted relatively briefly this evening, central, discomfort, nonradiating.  Per triage provider she had associated shortness of breath and palpitations.  Patient denies any shortness of breath or palpitations to me.  She states that her pain is completely resolved and she now has no symptoms.  Left heart catheterization 2017 Prox LAD to Mid LAD lesion, 50% stenosed. Ost 1st Diag to 1st Diag lesion, 70% stenosed. Ost 2nd Diag to 2nd Diag lesion, 75% stenosed. Dist Cx lesion, 40% stenosed. Prox RCA to Dist RCA lesion, 30% stenosed.  HPI     Home Medications Prior to Admission medications   Medication Sig Start Date End Date Taking? Authorizing Provider  allopurinol (ZYLOPRIM) 100 MG tablet Take 100 mg by mouth daily. 05/27/20   [provider]  Cholecalciferol (VITAMIN D3) 50 MCG (2000 UT) TABS Take 2,000 Units by mouth daily.  07/30/19   [provider]  diltiazem (TIAZAC) 120 MG 24 hr capsule Take 240 mg by mouth daily. 09/08/19   [provider]  esomeprazole (NEXIUM) 20 MG capsule TAKE 1 CAPSULE (20 MG TOTAL) BY MOUTH DAILY AT 12 NOON. 05/25/21   Patwardhan, Reynold Bowen, MD  ezetimibe (ZETIA) 10 MG tablet Take 10 mg by mouth daily.    [provider]  ferrous sulfate 325 (65 FE) MG tablet Take 325 mg by mouth daily with breakfast.  07/30/19   [provider]  furosemide (LASIX) 20 MG tablet TAKE 1 TABLET BY MOUTH EVERY DAY 04/14/21   Patwardhan, Manish J, MD  hydrALAZINE (APRESOLINE) 25 MG  tablet TAKE 1 TABLET BY MOUTH THREE TIMES A DAY Patient taking differently: Take 25 mg by mouth 3 (three) times daily. 07/14/20   Patwardhan, Reynold Bowen, MD  insulin glargine (LANTUS) 100 UNIT/ML injection Inject 65 Units into the skin daily before breakfast.    [provider]  isosorbide mononitrate (IMDUR) 30 MG 24 hr tablet Take 1 tablet (30 mg total) by mouth daily. 07/08/20 06/08/21  Patwardhan, Reynold Bowen, MD  latanoprost (XALATAN) 0.005 % ophthalmic solution INSTILL 1 DROP IN LEFT EYE NIGHTLY Patient taking differently: Place 1 drop into the left eye at bedtime. 12/01/20   Rankin, Clent Demark, MD  losartan (COZAAR) 25 MG tablet TAKE 2 TABLETS BY MOUTH DAILY 04/14/21   Patwardhan, Reynold Bowen, MD  meclizine (ANTIVERT) 12.5 MG tablet Take 1 tablet (12.5 mg total) by mouth 3 (three) times daily as needed for dizziness. Patient taking differently: Take 12.5 mg by mouth 2 (two) times daily. 08/31/20   Cardama, Grayce Sessions, MD  nitroGLYCERIN (NITROSTAT) 0.4 MG SL tablet Place 1 tablet (0.4 mg total) under the tongue as needed for chest pain. 06/08/21   Patwardhan, Reynold Bowen, MD  oxybutynin (DITROPAN-XL) 10 MG 24 hr tablet Take 10 mg by mouth daily.    [provider]  Polyvinyl Alcohol-Povidone (REFRESH OP) Place 1 drop into both eyes at bedtime.    [provider]      Allergies    Amoxicillin  and Invokana [canagliflozin]    Review of Systems   Review of Systems  Constitutional:  Negative for chills and fever.  HENT:  Negative for ear pain and sore throat.   Eyes:  Negative for pain and visual disturbance.  Respiratory:  Negative for cough and shortness of breath.   Cardiovascular:  Positive for chest pain. Negative for palpitations.  Gastrointestinal:  Negative for abdominal pain and vomiting.  Genitourinary:  Negative for dysuria and hematuria.  Musculoskeletal:  Negative for arthralgias and back pain.  Skin:  Negative for color change and rash.  Neurological:  Negative  for seizures and syncope.  All other systems reviewed and are negative.  Physical Exam Updated Vital Signs BP 135/69    Pulse 71    Temp 99 F (37.2 C) (Oral)    Resp (!) 25    Ht 5\' 2"  (1.575 m)    Wt (!) 145.8 kg    SpO2 100%    BMI 58.79 kg/m  Physical Exam Vitals and nursing note reviewed.  Constitutional:      General: She is not in acute distress.    Appearance: She is well-developed.  HENT:     Head: Normocephalic and atraumatic.  Eyes:     Conjunctiva/sclera: Conjunctivae normal.  Cardiovascular:     Rate and Rhythm: Normal rate and regular rhythm.     Heart sounds: No murmur heard. Pulmonary:     Effort: Pulmonary effort is normal. No respiratory distress.     Breath sounds: Normal breath sounds.  Abdominal:     Palpations: Abdomen is soft.     Tenderness: There is no abdominal tenderness.  Musculoskeletal:        General: No swelling.     Cervical back: Neck supple.  Skin:    General: Skin is warm and dry.     Capillary Refill: Capillary refill takes less than 2 seconds.  Neurological:     Mental Status: She is alert.  Psychiatric:        Mood and Affect: Mood normal.    ED Results / Procedures / Treatments   Labs (all labs ordered are listed, but only abnormal results are displayed) Labs Reviewed  BASIC METABOLIC PANEL - Abnormal; Notable for the following components:      Result Value   Glucose, Bld 123 (*)    Creatinine, Ser 1.81 (*)    GFR, Estimated 27 (*)    All other components within normal limits  CBC - Abnormal; Notable for the following components:   Hemoglobin 11.9 (*)    All other components within normal limits  TROPONIN I (HIGH SENSITIVITY)  TROPONIN I (HIGH SENSITIVITY)    EKG EKG Interpretation  Date/Time:  Tuesday November 01 2021 17:17:23 EST Ventricular Rate:  91 PR Interval:  138 QRS Duration: 84 QT Interval:  374 QTC Calculation: 460 R Axis:   -66 Text Interpretation: Normal sinus rhythm Left axis deviation Low voltage  QRS Cannot rule out Anterior infarct , age undetermined Abnormal ECG When compared with ECG of 29-Apr-2021 10:58, PREVIOUS ECG IS PRESENT Confirmed by Madalyn Rob [phone]) on 11/01/2021 10:21:56 PM  Radiology DG Chest 2 View  Result Date: 11/01/2021 CLINICAL DATA:  Chest pain radiating down left arm, short of breath EXAM: CHEST - 2 VIEW COMPARISON:  04/29/2021 FINDINGS: Frontal and lateral views of the chest demonstrate an unremarkable cardiac silhouette. No airspace disease, effusion, or pneumothorax. No acute bony abnormalities. IMPRESSION: 1. No acute intrathoracic process. Electronically Signed   By:  Randa Ngo M.D.   On: 11/01/2021 18:14    Procedures Procedures    Medications Ordered in ED Medications - No data to display  ED Course/ Medical Decision Making/ A&P                           Medical Decision Making Amount and/or Complexity of Data Reviewed Labs: ordered. Radiology: ordered.   86 year old lady presenting to ER with concern for episode of chest discomfort, chest pain.  On exam now she appears well in no distress, EKG did not have any acute ischemic change and her troponin x2 is within normal limits.  Last heart cath in 2017 demonstrated nonobstructive coronary artery disease.  Reviewed last cardiology note, October 2022.  Also has history of hypertension, hyperlipidemia, diabetes.  Other basic labs are grossly stable, no anemia, no electrolyte derangement.  CXR negative for pneumonia.  Given no ongoing symptoms and reassuring work-up, I have very low suspicion for ACS at this time.  Furthermore given the work-up today and her stable appearance and stable vital signs, feel she can be discharged and managed in the outpatient setting.  Advised patient that she follow-up with her cardiologist and primary care doctor.  Discussed plan with patient's family member at bedside who demonstrated agreement with plan.  Reviewed return precautions and discharge.    After the  discussed management above, the patient was determined to be safe for discharge.  The patient was in agreement with this plan and all questions regarding their care were answered.  ED return precautions were discussed and the patient will return to the ED with any significant worsening of condition.         Final Clinical Impression(s) / ED Diagnoses Final diagnoses:  Chest pain, unspecified type    Rx / DC Orders ED Discharge Orders     None         Lucrezia Starch, MD 11/01/21 2332

## 2021-11-01 NOTE — ED Notes (Signed)
Discharge instructions reviewed with patient and family. Patient and family verbalized understanding of instructions. Follow-up care and medications were reviewed. Patient ambulatory with steady gait. VSS upon discharge.

## 2021-11-10 ENCOUNTER — Other Ambulatory Visit: Payer: Self-pay | Admitting: Cardiology

## 2021-11-10 DIAGNOSIS — R0789 Other chest pain: Secondary | ICD-10-CM

## 2021-11-14 ENCOUNTER — Encounter: Payer: Self-pay | Admitting: Cardiology

## 2021-11-14 ENCOUNTER — Other Ambulatory Visit: Payer: Self-pay

## 2021-11-14 ENCOUNTER — Ambulatory Visit: Payer: Medicare Other | Admitting: Cardiology

## 2021-11-14 VITALS — BP 124/67 | HR 91 | Temp 98.0°F | Resp 16 | Ht 62.0 in | Wt 318.0 lb

## 2021-11-14 DIAGNOSIS — I10 Essential (primary) hypertension: Secondary | ICD-10-CM

## 2021-11-14 DIAGNOSIS — I25118 Atherosclerotic heart disease of native coronary artery with other forms of angina pectoris: Secondary | ICD-10-CM

## 2021-11-14 MED ORDER — ASPIRIN EC 81 MG PO TBEC: 81.0000 mg | DELAYED_RELEASE_TABLET | Freq: Every day | ORAL | 3 refills | Status: DC

## 2021-11-14 NOTE — Progress Notes (Unsigned)
Follow up visit  Subjective:   Jeanne [name], female    DOB: Mar 16, 1936, 86 y.o.   MRN: [id number]     HPI   Chief Complaint  Patient presents with   Chest Pain   Coronary Artery Disease   Follow-up    6 week    86 year old African-American female with nonobstructive CAD, hypertension, hyperlipidemia, type 2 DM, OSA,  IgG kappa monoclonal gammopathy   ***Patient has only occasional episodes of chest pain.  They do seem to occur with physical exertion, improved with rest.  She has had only about 2 episodes in 1 month.    Current Outpatient Medications on File Prior to Visit  Medication Sig Dispense Refill   allopurinol (ZYLOPRIM) 100 MG tablet Take 100 mg by mouth daily.     Cholecalciferol (VITAMIN D3) 50 MCG (2000 UT) TABS Take 2,000 Units by mouth daily.      diltiazem (TIAZAC) 120 MG 24 hr capsule Take 240 mg by mouth daily.     esomeprazole (NEXIUM) 20 MG capsule TAKE 1 CAPSULE (20 MG TOTAL) BY MOUTH DAILY AT 12 NOON. 90 capsule 1   ezetimibe (ZETIA) 10 MG tablet Take 10 mg by mouth daily.     ferrous sulfate 325 (65 FE) MG tablet Take 325 mg by mouth daily with breakfast.      furosemide (LASIX) 20 MG tablet TAKE 1 TABLET BY MOUTH EVERY DAY 90 tablet 1   hydrALAZINE (APRESOLINE) 25 MG tablet TAKE 1 TABLET BY MOUTH THREE TIMES A DAY (Patient taking differently: Take 25 mg by mouth 3 (three) times daily.) 270 tablet 3   insulin glargine (LANTUS) 100 UNIT/ML injection Inject 65 Units into the skin daily before breakfast.     isosorbide mononitrate (IMDUR) 30 MG 24 hr tablet Take 1 tablet (30 mg total) by mouth daily. 90 tablet 3   latanoprost (XALATAN) 0.005 % ophthalmic solution INSTILL 1 DROP IN LEFT EYE NIGHTLY (Patient taking differently: Place 1 drop into the left eye at bedtime.) 7.5 mL 12   losartan (COZAAR) 25 MG tablet TAKE 2 TABLETS BY MOUTH DAILY 180 tablet 2   meclizine (ANTIVERT) 12.5 MG tablet Take 1 tablet (12.5 mg total) by mouth 3 (three) times daily as  needed for dizziness. (Patient taking differently: Take 12.5 mg by mouth 2 (two) times daily.) 30 tablet 0   nitroGLYCERIN (NITROSTAT) 0.4 MG SL tablet Place 1 tablet (0.4 mg total) under the tongue as needed for chest pain. 25 tablet 3   oxybutynin (DITROPAN-XL) 10 MG 24 hr tablet Take 10 mg by mouth daily.     Polyvinyl Alcohol-Povidone (REFRESH OP) Place 1 drop into both eyes at bedtime.     No current facility-administered medications on file prior to visit.    Cardiovascular & other pertient studies:  Echocardiogram 05/27/2021:  Poor echo window and foreshortened views due to patient body habitus.  Measurements may not be accurate.  Mildly depressed LV systolic function with EF 50%. Left ventricle cavity  is normal in size. Severe concentric hypertrophy of the left ventricle.  Normal global wall motion. Doppler evidence of grade II (pseudonormal)  diastolic dysfunction, elevated LAP. Calculated EF 50%.  Left atrial cavity is moderately dilated.  Right atrial cavity is mildly dilated.  Right ventricle cavity is moderately dilated. Mildly reduced right  ventricular function.  Mild (Grade I) mitral regurgitation.  Moderate tricuspid regurgitation. Mild pulmonary hypertension. RVSP  measures 39 mmHg.  No significnat change since 06/18/2018.  CT Chest 06/09/2020:  No evidence of parenchymal nodule to correspond with the findings of recent chest x-ray. The finding seen on recent chest x-ray are related to tortuosity of the vasculature as well exuberant degenerative changes in the thoracic spine and ribcage.   Small sliding-type hiatal hernia.   Mild thickening of the pericardium greater than that seen on the prior exam which may represent a small effusion. The need for further follow-up can be determined on a clinical basis.   Aortic Atherosclerosis (ICD10-I70.0).  EKG 06/03/2020: Sinus rhythm 82 bpm  Left anterior fascicular block Nonspecific T-abnormality   Coronary  angiogram 2017: Prox LAD to Mid LAD lesion, 50% stenosed. Ost 1st Diag to 1st Diag lesion, 70% stenosed. Ost 2nd Diag to 2nd Diag lesion, 75% stenosed. Dist Cx lesion, 40% stenosed. Prox RCA to Dist RCA lesion, 30% stenosed.   Mild to moderate obstructive coronary disease involving the mid LAD, the first and second diagonal branches, the distal circumflex, and diffusely throughout the mid RCA. No clinically significant obstruction is identified especially with reference to the perfusion abnormalities noted on radionuclide scintigraphy. Previously documented normal left ventricular function. Normal left ventricular hemodynamics were identified with an end-diastolic pressure of 13 mmHg.     Recommendations:   Consider alternative explanations for the patient's chest discomfort Disposition is per internal medicine.    10/2018: Normal examination. No evidence of hemodynamically significant peripheral arterial disease.   Signed,  Recent labs: 11/01/2021: Glucose 123, BUN/Cr 23/1.81. EGFR 27. Na/K 137/3.8.  H/H 11.9/37.3. MCV 87. Platelets 256  04/29/2021: Glucose 186, BUN/Cr 19/1.63. EGFR 31. Na/K 137/4.0.  H/H 11/36. MCV 90. Platelets 237 Trop HS 14, 15  08/30/2020: Glucose 99, BUN/Cr 33/1.98. EGFR 25. Na/K 139/3.8.  H/H 11/36. MCV 86. Platelets 255  06/09/2020: Glucose 158, BUN/Cr 20/1.79. EGFR N/A. Na/K 140/2.9. Rest of the CMP normal H/H 10/33. MCV 87. Platelets 247  01/27/2020: Glucose 250, BUN/Cr 22/1.63. EGFR 34. Na/K 140/3.5. Rest of the CMP normal H/H 12/37. MCV 85. Platelets 200  03/30/2020: Chol 92, TG 77, HDL 32, LDL 45   Review of Systems  Cardiovascular:  Positive for chest pain. Negative for dyspnea on exertion, leg swelling, palpitations and syncope.        Vitals:   11/14/21 1454  BP: 124/67  Pulse: 91  Resp: 16  Temp: 98 F (36.7 C)  SpO2: 97%     Body mass index is 58.16 kg/m. Filed Weights   11/14/21 1454  Weight: (!) 318 lb (144.2 kg)      Objective:   Physical Exam Vitals and nursing note reviewed.  Constitutional:      General: She is not in acute distress.    Appearance: She is obese.  Neck:     Vascular: No JVD.  Cardiovascular:     Rate and Rhythm: Normal rate and regular rhythm.     Heart sounds: Normal heart sounds. No murmur heard. Pulmonary:     Effort: Pulmonary effort is normal.     Breath sounds: Normal breath sounds. No wheezing or rales.  Musculoskeletal:     Right lower leg: No edema.     Left lower leg: No edema.          Assessment & Recommendations:   86 year old African-American female with nonobstructive CAD, hypertension, hyperlipidemia, type 2 DM, OSA,  IgG kappa monoclonal gammopathy   *** Chest pain: Symptoms are suggestive of angina, but infrequent and frequency.  Stress testing will be low yield given her body habitus.  Coronary angiogram and  consideration for intervention can be performed, if symptoms are severe and infrequent.  This has to be weighed with risk of contrast-induced nephropathy in the setting of her EGFR in low 30s.  At this time, I have recommended continued medical management with diltiazem and Imdur.  Also refill this as needed sublingual nitroglycerin.  We will revisit this discussion in 4 weeks.  Hypertension: Controlled  F/u in 4 weeks   Nigel Mormon, MD Pager: [phone] Office: [phone]

## 2021-11-15 ENCOUNTER — Encounter: Payer: Self-pay | Admitting: Cardiology

## 2021-11-21 ENCOUNTER — Ambulatory Visit: Payer: Self-pay | Admitting: Cardiology

## 2021-12-01 ENCOUNTER — Other Ambulatory Visit: Payer: Self-pay | Admitting: Cardiology

## 2021-12-01 DIAGNOSIS — I5032 Chronic diastolic (congestive) heart failure: Secondary | ICD-10-CM

## 2021-12-01 DIAGNOSIS — I519 Heart disease, unspecified: Secondary | ICD-10-CM

## 2021-12-05 ENCOUNTER — Other Ambulatory Visit (INDEPENDENT_AMBULATORY_CARE_PROVIDER_SITE_OTHER): Payer: Self-pay | Admitting: Ophthalmology

## 2022-01-06 ENCOUNTER — Other Ambulatory Visit: Payer: Self-pay | Admitting: Cardiology

## 2022-03-23 ENCOUNTER — Encounter (HOSPITAL_COMMUNITY): Payer: Self-pay

## 2022-03-23 ENCOUNTER — Other Ambulatory Visit: Payer: Self-pay

## 2022-03-23 ENCOUNTER — Emergency Department (HOSPITAL_COMMUNITY)
Admission: EM | Admit: 2022-03-23 | Discharge: 2022-03-23 | Disposition: A | Discharge status: Home / Self Care | Payer: Medicare Other | Attending: Emergency Medicine | Admitting: Emergency Medicine

## 2022-03-23 ENCOUNTER — Emergency Department (HOSPITAL_COMMUNITY): Payer: Medicare Other

## 2022-03-23 DIAGNOSIS — R2243 Localized swelling, mass and lump, lower limb, bilateral: Secondary | ICD-10-CM | POA: Diagnosis not present

## 2022-03-23 DIAGNOSIS — Z7982 Long term (current) use of aspirin: Secondary | ICD-10-CM | POA: Insufficient documentation

## 2022-03-23 DIAGNOSIS — I10 Essential (primary) hypertension: Secondary | ICD-10-CM | POA: Diagnosis not present

## 2022-03-23 DIAGNOSIS — Z794 Long term (current) use of insulin: Secondary | ICD-10-CM | POA: Insufficient documentation

## 2022-03-23 DIAGNOSIS — R42 Dizziness and giddiness: Secondary | ICD-10-CM | POA: Diagnosis present

## 2022-03-23 DIAGNOSIS — E119 Type 2 diabetes mellitus without complications: Secondary | ICD-10-CM | POA: Diagnosis not present

## 2022-03-23 DIAGNOSIS — Z79899 Other long term (current) drug therapy: Secondary | ICD-10-CM | POA: Insufficient documentation

## 2022-03-23 LAB — CBC WITH DIFFERENTIAL/PLATELET
Abs Immature Granulocytes: 0.02 10*3/uL (ref 0.00–0.07)
Basophils Absolute: 0 10*3/uL (ref 0.0–0.1)
Basophils Relative: 1 %
Eosinophils Absolute: 0.2 10*3/uL (ref 0.0–0.5)
Eosinophils Relative: 4 %
HCT: 36.3 % (ref 36.0–46.0)
Hemoglobin: 11.5 g/dL — ABNORMAL LOW (ref 12.0–15.0)
Immature Granulocytes: 0 %
Lymphocytes Relative: 26 %
Lymphs Abs: 1.4 10*3/uL (ref 0.7–4.0)
MCH: 27.2 pg (ref 26.0–34.0)
MCHC: 31.7 g/dL (ref 30.0–36.0)
MCV: 85.8 fL (ref 80.0–100.0)
Monocytes Absolute: 0.5 10*3/uL (ref 0.1–1.0)
Monocytes Relative: 9 %
Neutro Abs: 3.2 10*3/uL (ref 1.7–7.7)
Neutrophils Relative %: 60 %
Platelets: 290 10*3/uL (ref 150–400)
RBC: 4.23 MIL/uL (ref 3.87–5.11)
RDW: 14.2 % (ref 11.5–15.5)
WBC: 5.4 10*3/uL (ref 4.0–10.5)
nRBC: 0 % (ref 0.0–0.2)

## 2022-03-23 LAB — BASIC METABOLIC PANEL
Anion gap: 12 (ref 5–15)
BUN: 17 mg/dL (ref 8–23)
CO2: 28 mmol/L (ref 22–32)
Calcium: 9.4 mg/dL (ref 8.9–10.3)
Chloride: 99 mmol/L (ref 98–111)
Creatinine, Ser: 1.53 mg/dL — ABNORMAL HIGH (ref 0.44–1.00)
GFR, Estimated: 33 mL/min — ABNORMAL LOW (ref >60)
Glucose, Bld: 170 mg/dL — ABNORMAL HIGH (ref 70–99)
Potassium: 4.4 mmol/L (ref 3.5–5.1)
Sodium: 139 mmol/L (ref 135–145)

## 2022-03-23 MED ORDER — MECLIZINE HCL 25 MG PO TABS: 25.0000 mg | ORAL_TABLET | Freq: Three times a day (TID) | ORAL | 0 refills | Status: DC | PRN

## 2022-03-23 NOTE — ED Notes (Signed)
Patient ambulated to the bathroom with a steady gait. No complaints of dizziness. Provider made aware of same.

## 2022-03-23 NOTE — Discharge Instructions (Addendum)
OutYou were seen in the emergency department for evaluation of lightheadedness which onset after you had bent down this morning in the shower.  Laboratory work performed here in the hospital today was very reassuring and did not show any signs electrolyte abnormalities, bleeding within your brain, or changes in your heart rhythm to suggest a heart attack.  At this time you are felt appropriate for discharge home and further management in the outpatient setting.  You were given a prescription for Antivert and were given a referral patient neurology to follow-up in the circumstance.  We recommend continuing to take all of your home medications as prescribed.  Please follow-up with your primary care provider and schedule an appointment in the coming days for symptom recheck.

## 2022-03-23 NOTE — ED Provider Triage Note (Signed)
Emergency Medicine Provider Triage Evaluation Note  Jeanne [name] , a 86 y.o. female  was evaluated in triage.  Pt complains of dizziness/wooziness.  Started acutely this morning, its been constant.  Moving her eyes makes it worse.  She feels like she is going to pass out but has not, denies any pain anywhere.  No syncopal episode.  No lateralized weakness or numbness.  Has not been taking her home medicine for the last 2 days due to running out of it, has not picked up refill yet..  Review of Systems  Per HPI  Physical Exam  BP 123/90 (BP Location: Right Arm)   Pulse 85   Temp 98.4 F (36.9 C) (Oral)   Resp 17   SpO2 100%  Gen:   Awake, no distress   Resp:  Normal effort  MSK:   Moves extremities without difficulty  Other:  No nystagmus.  Cranial nerves III through XII are grossly intact, grip strength is equal bilaterally.  Dorsiflexion plantarflexion 5/5.  Medical Decision Making  Medically screening exam initiated at 9:46 AM.  Appropriate orders placed.  Jeanne [name] was informed that the remainder of the evaluation will be completed by another provider, this initial triage assessment does not replace that evaluation, and the importance of remaining in the ED until their evaluation is complete.     Sherrill Raring, [place] 03/23/22 [phone]

## 2022-03-23 NOTE — ED Provider Notes (Signed)
[facility] EMERGENCY DEPARTMENT Provider Note   CSN: [id number] Arrival date & time: 03/23/22  0930     History  Chief Complaint  Patient presents with   Dizziness    Jeanne [name] is a 86 y.o. female.   Dizziness  Patient is a 86 year old female with a history of HTN, HLD, DM, migraines, obesity, sleep apnea who presents emergency department for evaluation of dizziness.  According to the patient this morning she was in the shower and after having bent down to pick up a box that she had drop when she stood up she became very lightheaded.  She denies any chest pain, shortness of breath, palpitations.  She states that she does have a history of similar, however her lightheadedness has not resolved.  She denies any blurred or double vision or difficulty with ambulation.  She denies any direct head trauma and is not currently on blood thinners.     Home Medications Prior to Admission medications   Medication Sig Start Date End Date Taking? Authorizing Provider  meclizine (ANTIVERT) 25 MG tablet Take 1 tablet (25 mg total) by mouth 3 (three) times daily as needed for dizziness. 03/23/22  Yes Jeanne [name], Jeanne Brow, MD  allopurinol (ZYLOPRIM) 100 MG tablet Take 100 mg by mouth daily. 05/27/20   [provider]  aspirin EC 81 MG tablet Take 1 tablet (81 mg total) by mouth daily. Swallow whole. 11/14/21   Patwardhan, Jeanne Bowen, MD  Cholecalciferol (VITAMIN D3) 50 MCG (2000 UT) TABS Take 2,000 Units by mouth daily.  07/30/19   [provider]  dapagliflozin propanediol (FARXIGA) 10 MG TABS tablet 1 tablet 07/08/21   [provider]  diltiazem (TIAZAC) 120 MG 24 hr capsule Take 240 mg by mouth daily. 09/08/19   [provider]  esomeprazole (NEXIUM) 20 MG capsule TAKE 1 CAPSULE (20 MG TOTAL) BY MOUTH DAILY AT 12 NOON. 11/10/21   Patwardhan, Jeanne J, MD  ezetimibe (ZETIA) 10 MG tablet Take 10 mg by mouth daily.    [provider]  ferrous  sulfate 325 (65 FE) MG tablet Take 325 mg by mouth daily with breakfast.  07/30/19   [provider]  furosemide (LASIX) 20 MG tablet TAKE 1 TABLET BY MOUTH EVERY DAY 12/01/21   Patwardhan, Jeanne J, MD  hydrALAZINE (APRESOLINE) 25 MG tablet TAKE 1 TABLET BY MOUTH THREE TIMES A DAY Patient taking differently: Take 25 mg by mouth 3 (three) times daily. 07/14/20   Patwardhan, Jeanne Bowen, MD  insulin glargine (LANTUS) 100 UNIT/ML injection Inject 65 Units into the skin daily before breakfast.    [provider]  isosorbide mononitrate (IMDUR) 30 MG 24 hr tablet Take 1 tablet (30 mg total) by mouth daily. 07/08/20 11/14/21  Patwardhan, Jeanne Bowen, MD  latanoprost (XALATAN) 0.005 % ophthalmic solution INSTILL 1 DROP IN LEFT EYE NIGHTLY 12/05/21   Jeanne [name], Jeanne Demark, MD  losartan (COZAAR) 25 MG tablet TAKE 2 TABLETS BY MOUTH DAILY 01/06/22   Patwardhan, Jeanne J, MD  nitroGLYCERIN (NITROSTAT) 0.4 MG SL tablet Place 1 tablet (0.4 mg total) under the tongue as needed for chest pain. 06/08/21   Patwardhan, Jeanne Bowen, MD  oxybutynin (DITROPAN-XL) 10 MG 24 hr tablet Take 10 mg by mouth daily.    [provider]  Polyvinyl Alcohol-Povidone (REFRESH OP) Place 1 drop into both eyes at bedtime.    [provider]      Allergies    Amoxicillin and Invokana [canagliflozin]  Review of Systems   Review of Systems  Neurological:  Positive for dizziness.    Physical Exam Updated Vital Signs BP (!) 165/78   Pulse 84   Temp 98.2 F (36.8 C) (Oral)   Resp 17   SpO2 97%  Physical Exam Vitals and nursing note reviewed.  Constitutional:      General: She is not in acute distress.    Appearance: She is well-developed. She is obese.  HENT:     Head: Atraumatic.     Right Ear: External ear normal.     Left Ear: External ear normal.     Nose: Nose normal.     Mouth/Throat:     Mouth: Mucous membranes are moist.     Pharynx: Oropharynx is clear.  Eyes:     Extraocular Movements:  Extraocular movements intact.     Conjunctiva/sclera: Conjunctivae normal.     Pupils: Pupils are equal, round, and reactive to light.  Cardiovascular:     Rate and Rhythm: Normal rate and regular rhythm.     Heart sounds: No murmur heard. Pulmonary:     Effort: Pulmonary effort is normal. No respiratory distress.     Breath sounds: Normal breath sounds.  Abdominal:     Palpations: Abdomen is soft.     Tenderness: There is no abdominal tenderness.  Musculoskeletal:        General: No swelling.     Cervical back: Neck supple.     Right lower leg: Edema present.     Left lower leg: Edema present.  Skin:    General: Skin is warm and dry.     Capillary Refill: Capillary refill takes less than 2 seconds.  Neurological:     General: No focal deficit present.     Mental Status: She is alert and oriented to [name], place, and time. Mental status is at baseline.     Cranial Nerves: No cranial nerve deficit.     Motor: No weakness.     Coordination: Coordination normal.     Gait: Gait normal.  Psychiatric:        Mood and Affect: Mood normal.     ED Results / Procedures / Treatments   Labs (all labs ordered are listed, but only abnormal results are displayed) Labs Reviewed  CBC WITH DIFFERENTIAL/PLATELET - Abnormal; Notable for the following components:      Result Value   Hemoglobin 11.5 (*)    All other components within normal limits  BASIC METABOLIC PANEL - Abnormal; Notable for the following components:   Glucose, Bld 170 (*)    Creatinine, Ser 1.53 (*)    GFR, Estimated 33 (*)    All other components within normal limits    EKG EKG Interpretation  Date/Time:  Thursday March 23 2022 09:40:47 EDT Ventricular Rate:  88 PR Interval:  160 QRS Duration: 84 QT Interval:  380 QTC Calculation: 459 R Axis:   -72 Text Interpretation: Normal sinus rhythm Left axis deviation Low voltage QRS Cannot rule out Anterior infarct , age undetermined Abnormal ECG When compared with ECG  of 23-Mar-2022 09:39, PREVIOUS ECG IS PRESENT No significant change was found Confirmed by Varney Biles [id number] on 03/23/2022 7:33:58 PM  Radiology CT Head Wo Contrast  Result Date: 03/23/2022 CLINICAL DATA:  Dizziness EXAM: CT HEAD WITHOUT CONTRAST TECHNIQUE: Contiguous axial images were obtained from the base of the skull through the vertex without intravenous contrast. RADIATION DOSE REDUCTION: This exam was performed according to the departmental dose-optimization  program which includes automated exposure control, adjustment of the mA and/or kV according to patient size and/or use of iterative reconstruction technique. COMPARISON:  12/12/2020 FINDINGS: Brain: No acute findings are seen noncontrast CT brain. There are no signs of bleeding within the cranium. Ventricles are nondilated. Cortical sulci are prominent. There is decreased density in periventricular and subcortical white matter. Vascular: Unremarkable. Skull: Unremarkable. Sinuses/Orbits: There is mild mucosal thickening in ethmoid sinus. Other: There is increased amount of CSF in the sella suggesting partial empty sella. IMPRESSION: No acute intracranial findings are seen in noncontrast CT brain. Atrophy. Small-vessel disease. Electronically Signed   By: Elmer Picker M.D.   On: 03/23/2022 10:25    Procedures Procedures    Medications Ordered in ED Medications - No data to display  ED Course/ Medical Decision Making/ A&P                           Medical Decision Making Problems Addressed: Lightheadedness: complicated acute illness or injury with systemic symptoms Vertigo: complicated acute illness or injury with systemic symptoms  Amount and/or Complexity of Data Reviewed Independent Historian: caregiver and EMS External Data Reviewed: radiology and notes. Labs: ordered. Radiology: ordered. ECG/medicine tests: ordered and independent interpretation performed.    Details: EKG with rate 88.  QTc 459.  Left axis  deviation.  Low voltage throughout.  Sinus rhythm.  No acute ST or T wave abnormalities to suggest underlying ACS.   Patient is a 86 year old female who presents emergency department as above.  On initial presentation patient's vital signs are stable and she is afebrile.  She is currently still feeling some lightheadedness but her neurologic exam is reassuring and nonfocal.  EKG did not show any signs concerning for ACS.  Low suspicion for this in the absence of chest pain, shortness of breath or palpitations.  Baseline laboratory work was ordered for this patient as well as a CT head.  BMP with creatinine 1.53 which is improved from her baseline and prior.  BMP otherwise grossly unremarkable.  CBC without evidence of leukocytosis but a mild normocytic anemia at 11.5.  CT head with atrophy and small vessel disease but otherwise no acute intracranial findings.  Given this constellation of findings in the setting that the patient's symptoms are very positional, as her lightheadedness worsens when she turns her head to the left or right, it is felt that her symptoms are likely secondary to vertigo.  Low suspicion for acute intracranial or cardiac based pathology.  Given these findings patient will be discharged with a prescription for meclizine to take at home in the setting of her likely vertigo.  She was also instructed to follow-up with her primary care provider in the coming days for symptom recheck.  Plan and findings discussed with patient and family at bedside and all verbalized understanding and were agreeable.  Patient discharged in the ED in stable condition.        Final Clinical Impression(s) / ED Diagnoses Final diagnoses:  Lightheadedness  Vertigo    Rx / DC Orders ED Discharge Orders          Ordered    Ambulatory referral to Neurology       Comments: An appointment is requested in approximately: 4 weeks. Hx of ongoing vertigo   03/23/22 1943    meclizine (ANTIVERT) 25 MG  tablet  3 times daily PRN        03/23/22 1944  Nelta Numbers, MD 03/24/22 [place], [place], MD 03/28/22 [phone]

## 2022-03-23 NOTE — ED Notes (Signed)
ED Provider at bedside. 

## 2022-03-23 NOTE — ED Notes (Signed)
Patient provided with something to eat and drink at this time.  ?

## 2022-03-23 NOTE — ED Notes (Signed)
All discharge instructions reviewed with patient including follow up care and prescriptions. Patient verbalized understanding of same and had no other questions. Patient stable at time of discharge and wheel chair provided to patient prior to leaving department.

## 2022-03-23 NOTE — ED Triage Notes (Signed)
Pt reports increase dizziness since earlier this morning. Denies weakness, blurry vision. Pt reports not taking some of their at home meds due to lack of refill.   HX: DM

## 2022-03-25 ENCOUNTER — Other Ambulatory Visit: Payer: Self-pay | Admitting: Cardiology

## 2022-03-25 DIAGNOSIS — I119 Hypertensive heart disease without heart failure: Secondary | ICD-10-CM

## 2022-04-07 IMAGING — MR MR ABDOMEN W/O CM
11 series · 48 of 48 positions shown · non-contrast
Comparison: 11/12/2019 unenhanced CT abdomen/pelvis.

CLINICAL DATA: Abdominal pain and nausea for 2 days. Indeterminate
right renal masses on recent noncontrast CT.

EXAM:
MRI ABDOMEN WITHOUT CONTRAST
TECHNIQUE: Multiplanar multisequence MR imaging was performed without the
administration of intravenous contrast.

[Series 3: T2 · coronal · 5.0mm · 1.56mm/px · 1 of 45 slices shown (1 of 3)]
[im 1/45]
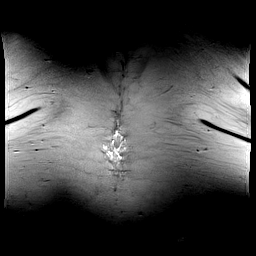

[Series 4: T1 · axial · 3.0mm · 1.19mm/px · z∈[-78,+183]mm · 10 of 176 slices shown]
[im 1/176]
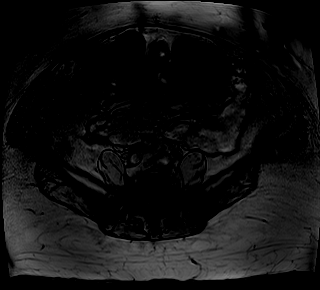
[im 20/176]
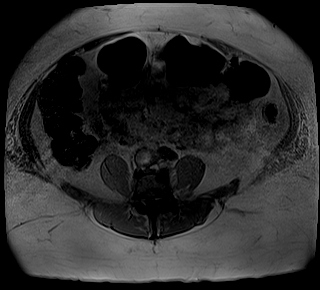
[im 39/176]
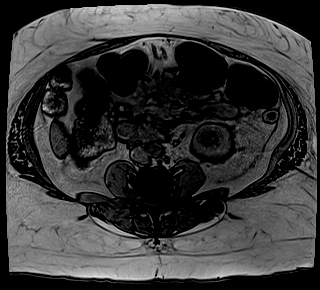
[im 59/176]
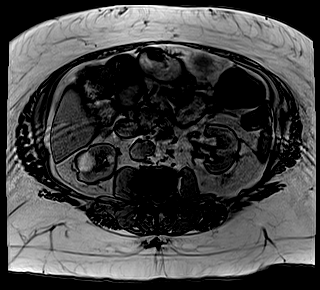
[im 78/176]
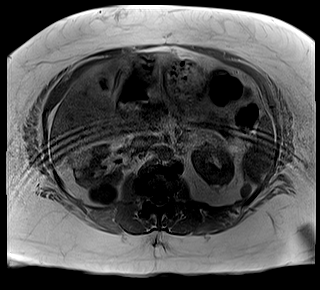
[im 98/176]
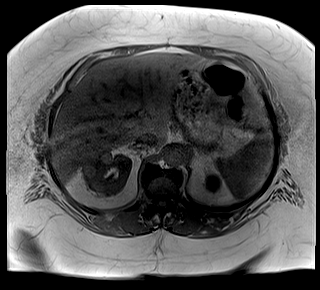
[im 117/176]
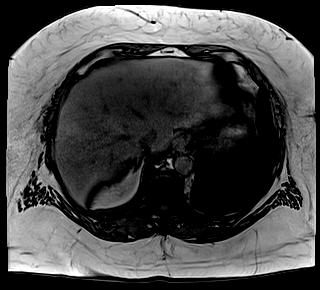
[im 137/176]
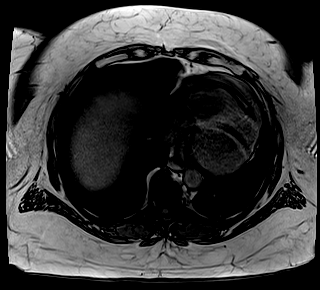
[im 156/176]
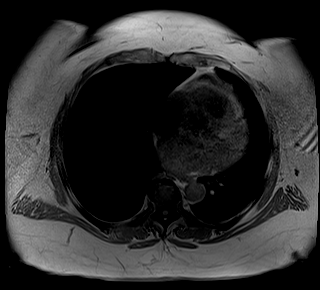
[im 176/176]
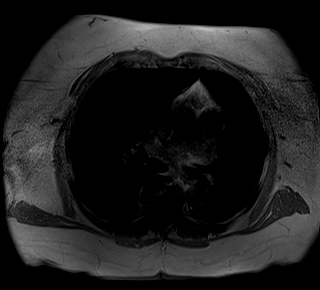

[Series 5: DWI · axial · 5.0mm · 1.42mm/px · z∈[-97,+197]mm · 8 of 150 slices shown (1 of 2)]
[im 1/150]
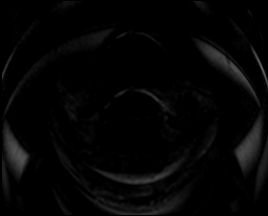
[im 22/150]
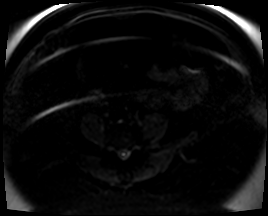
[im 43/150]
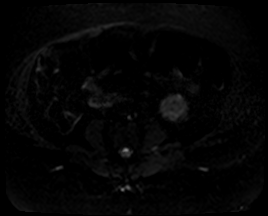
[im 64/150]
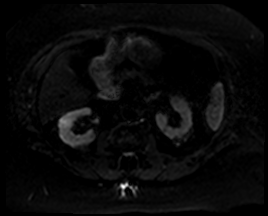
[im 86/150]
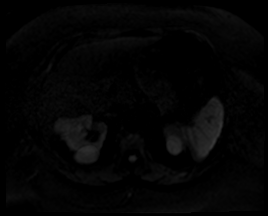
[im 107/150]
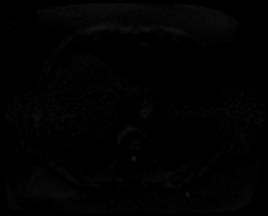
[im 128/150]
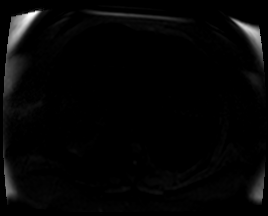
[im 150/150]
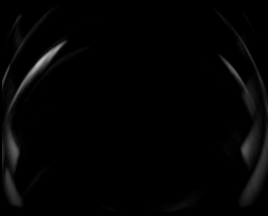

[Series 6: DWI · axial · 5.0mm · 1.42mm/px · z∈[-97,+197]mm · 3 of 50 slices shown (2 of 2)]
[im 1/50]
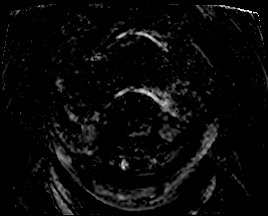
[im 25/50]
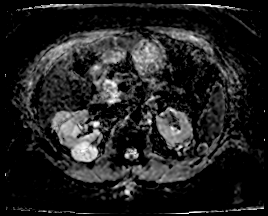
[im 50/50]
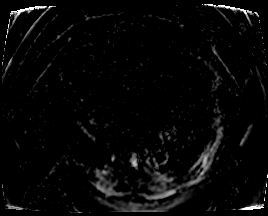

[Series 7: T2 · axial · 5.0mm · 1.56mm/px · z∈[-96,+198]mm · 3 of 50 slices shown (2 of 3)]
[im 1/50]
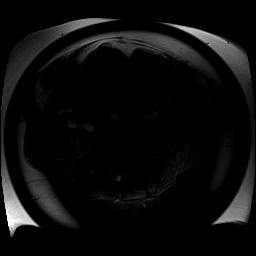
[im 25/50]
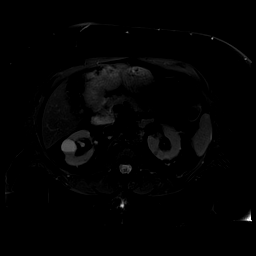
[im 50/50]
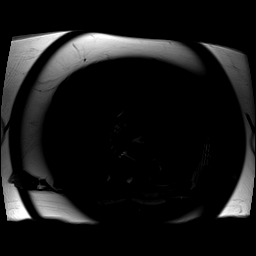

[Series 8: bSSFP · axial · 5.0mm · 1.25mm/px · z∈[-99,+195]mm · 3 of 50 slices shown (1 of 2)]
[im 1/50]
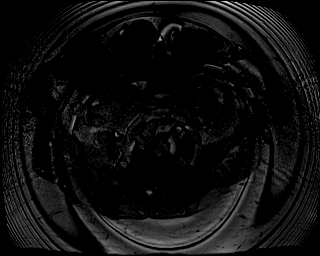
[im 25/50]
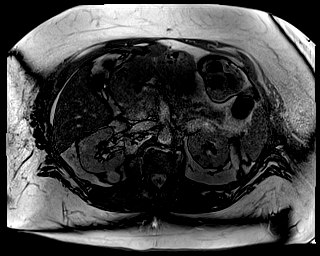
[im 50/50]
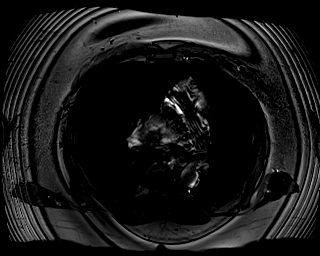

[Series 9: T2 · axial · 6.0mm · 1.22mm/px · z∈[-102,+208]mm · 2 of 44 slices shown (3 of 3)]
[im 1/44]
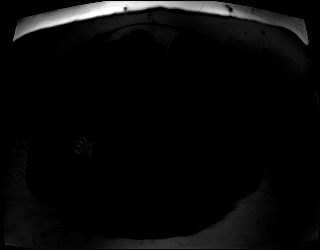
[im 44/44]
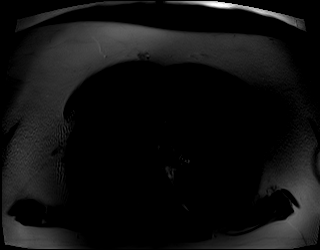

[Series 10: T1 dynamic · coronal · 3.0mm · 1.25mm/px · 5 of 88 slices shown (1 of 3)]
[im 1/88]
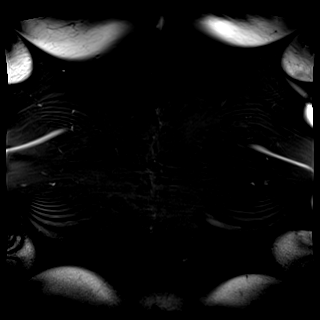
[im 22/88]
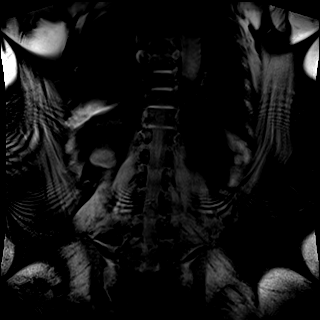
[im 44/88]
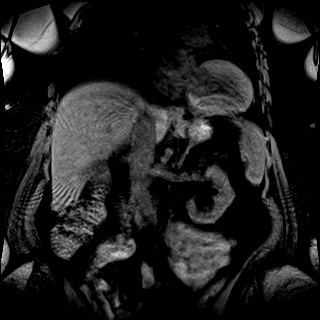
[im 66/88]
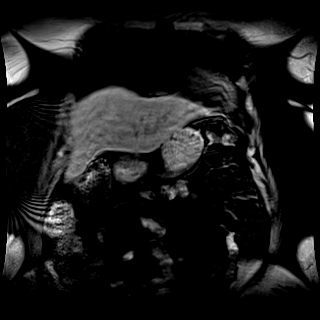
[im 88/88]
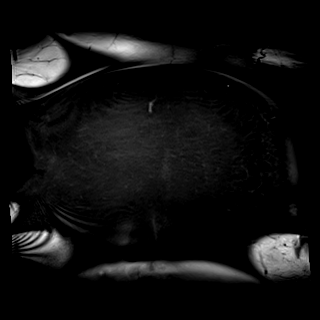

[Series 11: T1 dynamic · axial · 3.0mm · 1.25mm/px · z∈[-69,+192]mm · 5 of 88 slices shown (2 of 3)]
[im 1/88]
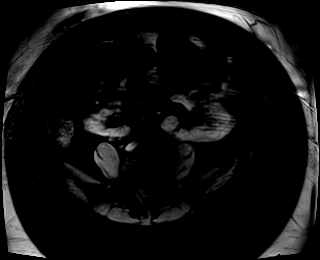
[im 22/88]
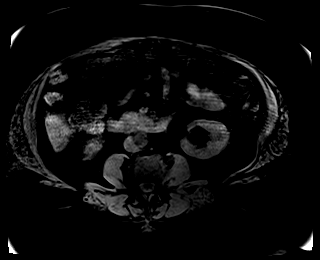
[im 44/88]
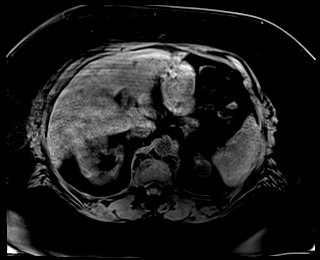
[im 66/88]
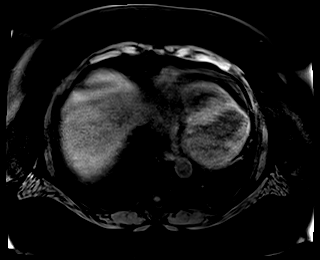
[im 88/88]
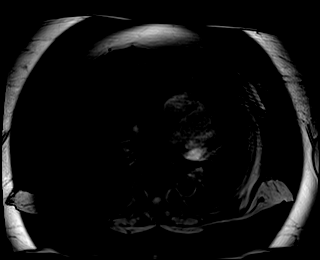

[Series 12: bSSFP · coronal · 5.0mm · 1.25mm/px · 3 of 50 slices shown (2 of 2)]
[im 1/50]
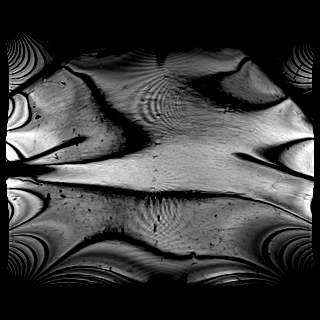
[im 25/50]
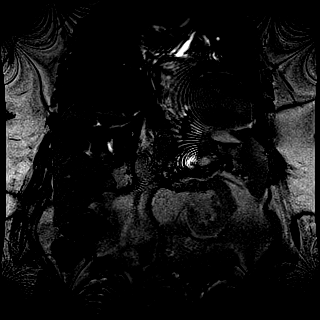
[im 50/50]
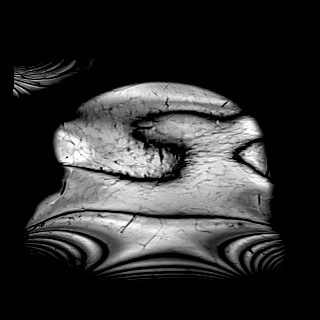

[Series 13: T1 dynamic · coronal · 3.0mm · 1.25mm/px · 5 of 88 slices shown (3 of 3)]
[im 1/88]
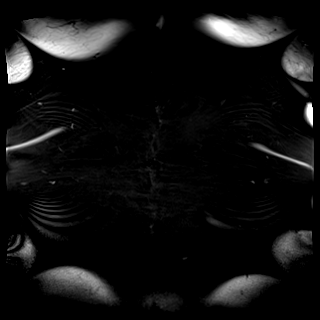
[im 22/88]
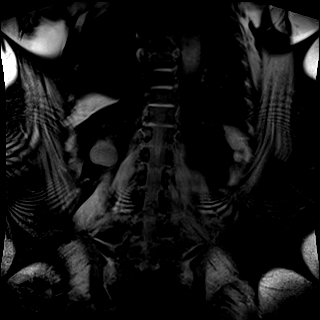
[im 44/88]
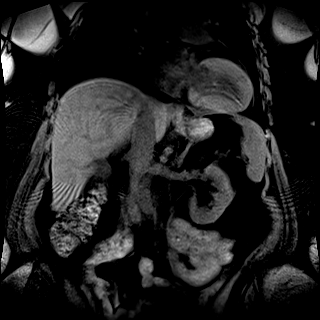
[im 66/88]
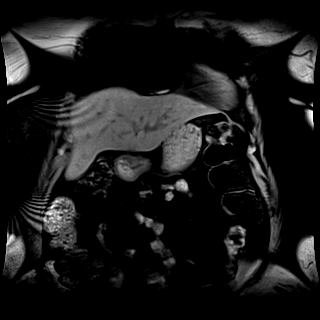
[im 88/88]
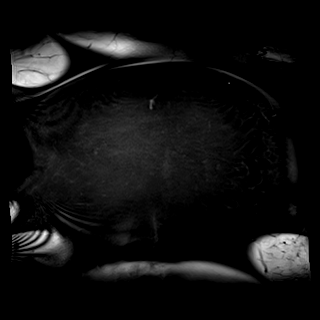

[48 of 48 positions shown; findings below may reference images not displayed]

FINDINGS: Lower chest: No acute abnormality at the lung bases.

Hepatobiliary: Normal liver size and configuration. No hepatic
steatosis. No liver mass. Cholecystectomy. No biliary ductal
dilatation. Common bile duct diameter 3 mm. No choledocholithiasis.
No biliary masses, strictures or beading.

Pancreas: No pancreatic mass or duct dilation.  No pancreas divisum.

Spleen: Normal size. No mass.

Adrenals/Urinary Tract: No discrete adrenal nodules. No
hydronephrosis. Simple appearing bilateral renal cortical cysts,
largest an exophytic 3.8 x 2.7 cm posterior interpolar right renal
simple cyst. Indeterminate T1 hyperintense T2 hypointense 1.8 x
cm lateral upper and 1.2 x 0.8 cm anterior upper right renal
cortical lesions (series 7/image 18). Indeterminate T1 hyperintense
T2 isointense 3.3 x 2.9 cm renal cortical lesion in the lateral
interpolar right kidney (series 7/image 22). Indeterminate 2.6 x
cm complex T1 hyperintense renal cortical cyst in the lateral lower
right kidney with layering low T2 attenuation material (series
7/image 27). Additional subcentimeter T1 hyperintense renal cortical
lesions scattered in both kidneys.

Stomach/Bowel: Small hiatal hernia. Otherwise normal nondistended
stomach. Visualized small and large bowel is normal caliber, with no
bowel wall thickening. Large colonic stool volume.

Vascular/Lymphatic: Normal caliber abdominal aorta. No
pathologically enlarged lymph nodes in the abdomen.

Other: No abdominal ascites or focal fluid collection.

Musculoskeletal: No aggressive appearing focal osseous lesions.
IMPRESSION: 1. Multiple indeterminate renal cortical lesions throughout the
right kidney, as detailed, incompletely characterized on this
noncontrast MRI study. Differential continues to include benign
hemorrhagic/proteinaceous complex renal cysts versus renal cell
carcinoma. Follow-up MRI abdomen without and with IV contrast is
recommended in 3-6 months. Please note that assuming no history of
IV gadolinium contrast allergy, the patient should be able to
receive IV gadolinium contrast even if renal function is
compromised.
2. No abdominal adenopathy.
3. Small hiatal hernia.

## 2022-04-13 ENCOUNTER — Encounter: Payer: Self-pay | Admitting: Neurology

## 2022-04-13 ENCOUNTER — Ambulatory Visit: Payer: Medicare Other | Admitting: Neurology

## 2022-04-13 VITALS — BP 136/79 | HR 90 | Ht 62.0 in | Wt 320.5 lb

## 2022-04-13 DIAGNOSIS — F09 Unspecified mental disorder due to known physiological condition: Secondary | ICD-10-CM

## 2022-04-13 DIAGNOSIS — R42 Dizziness and giddiness: Secondary | ICD-10-CM

## 2022-04-13 MED ORDER — DONEPEZIL HCL 10 MG PO TABS: 10.0000 mg | ORAL_TABLET | Freq: Every day | ORAL | 3 refills | Status: DC

## 2022-04-13 NOTE — Progress Notes (Signed)
[facility]  PATIENT: Jeanne [name] DOB: [birth year]  REQUESTING CLINICIAN: Varney Biles, MD HISTORY FROM: Patient and daughter REASON FOR VISIT: Dizziness/Cognitive impairment    HISTORICAL  CHIEF COMPLAINT:  Chief Complaint  Patient presents with   New Patient (Initial Visit)    Rm 12. Accompanied by daughter. NP/internal referral for dizziness.    HISTORY OF PRESENT ILLNESS:  This is a 86 year old woman past medical history of obesity, hypertension, hyperlipidemia, diabetes, glaucoma, CAD and CKD who is presenting for dizziness and daughter also reports cognitive impairment.  In terms of her dizziness, patient reports on July 20 while taking a shower she bent down to pick up the soap and when she got up she felt extreme dizziness that she have to sit down.  Her symptoms did not improve therefore she presented to the ED.  In the ED she had a head CT which was negative for any acute stroke and patient was discharged home with dizziness and Meclizine.  She reports dizziness at that time was positional, worse with head movement.  She has been on meclizine 3 times daily since July 20, reports improvement of her symptoms but stated that she will feel woozy when moving her head.  Denies any fall, denies any hearing loss denies any worsening tinnitus.    Her daughter who is present on the visit has concerns about her memory.  She reports that patient is forgetful, she forgets about recent conversation, but she cannot provide good recall of her childhood memory and she also noted that she is repetitive.  Patient is not driving because she got lost while driving and since then she has decided not to drive.  Patient currently she is living with both of her daughters, she is not cooking or cleaning but states that she will walk to one room and forget the reason why she was in the room in the first place.  Daughters are handling her bills but she is able to take care of  herself, she is able to bathe herself, dress herself without any help.  She does walk with the cane.   She does complaints of changes in her vision but has an appointment with ophthalmologist in a couple months    OTHER MEDICAL CONDITIONS: Diabetes, Hypertension, Hypertension, Glaucoma, CKD, CAD    REVIEW OF SYSTEMS: Full 14 system review of systems performed and negative with exception of: as noted in the HPI   ALLERGIES: Allergies  Allergen Reactions   Amoxicillin Nausea Only    Has patient had a PCN reaction causing immediate rash, facial/tongue/throat swelling, SOB or lightheadedness with hypotension: "yes, i was dizzy" Has patient had a PCN reaction causing severe rash involving mucus membranes or skin necrosis: no Did a PCN reaction that required hospitalization : no, called the DR. Did PCN reaction occurring within the last 10 years: unk if all of the above answers are "NO", then may proceed with Cephalosporin use.    Enalapril Maleate     Other reaction(s): angioedema   Invokana [Canagliflozin] Nausea Only and Other (See Comments)    Dizziness    Liraglutide     Other reaction(s): Nausea and dizziness   Pioglitazone     Other reaction(s): macular edema   Tramadol Hcl     Other reaction(s): nausea, headache and dizziness    HOME MEDICATIONS: Outpatient Medications Prior to Visit  Medication Sig Dispense Refill   allopurinol (ZYLOPRIM) 100 MG tablet Take 100 mg by mouth daily.  aspirin EC 81 MG tablet Take 1 tablet (81 mg total) by mouth daily. Swallow whole. 90 tablet 3   Cholecalciferol (VITAMIN D3) 50 MCG (2000 UT) TABS Take 2,000 Units by mouth daily.      diltiazem (TIAZAC) 120 MG 24 hr capsule Take 240 mg by mouth daily.     esomeprazole (NEXIUM) 20 MG capsule TAKE 1 CAPSULE (20 MG TOTAL) BY MOUTH DAILY AT 12 NOON. 90 capsule 1   ezetimibe (ZETIA) 10 MG tablet Take 10 mg by mouth daily.     ferrous sulfate 325 (65 FE) MG tablet Take 325 mg by mouth daily with  breakfast.      hydrALAZINE (APRESOLINE) 25 MG tablet TAKE 1 TABLET BY MOUTH THREE TIMES A DAY 270 tablet 3   insulin glargine (LANTUS) 100 UNIT/ML injection Inject 65 Units into the skin daily before breakfast.     latanoprost (XALATAN) 0.005 % ophthalmic solution INSTILL 1 DROP IN LEFT EYE NIGHTLY 7.5 mL 12   losartan (COZAAR) 25 MG tablet TAKE 2 TABLETS BY MOUTH DAILY 180 tablet 2   meclizine (ANTIVERT) 25 MG tablet Take 1 tablet (25 mg total) by mouth 3 (three) times daily as needed for dizziness. 30 tablet 0   nitroGLYCERIN (NITROSTAT) 0.4 MG SL tablet Place 1 tablet (0.4 mg total) under the tongue as needed for chest pain. 25 tablet 3   oxybutynin (DITROPAN-XL) 10 MG 24 hr tablet Take 10 mg by mouth daily.     Polyvinyl Alcohol-Povidone (REFRESH OP) Place 1 drop into both eyes at bedtime.     dapagliflozin propanediol (FARXIGA) 10 MG TABS tablet 1 tablet     furosemide (LASIX) 20 MG tablet TAKE 1 TABLET BY MOUTH EVERY DAY 90 tablet 1   isosorbide mononitrate (IMDUR) 30 MG 24 hr tablet Take 1 tablet (30 mg total) by mouth daily. 90 tablet 3   No facility-administered medications prior to visit.    PAST MEDICAL HISTORY: Past Medical History:  Diagnosis Date   Anemia    Arthritis    Carotid arterial disease ([place])    COVID-19    Diabetes mellitus    Hyperlipidemia    Hypertension    Migraine    Obesity    Reflux    Sleep apnea     PAST SURGICAL HISTORY: Past Surgical History:  Procedure Laterality Date   CARDIAC CATHETERIZATION N/A 10/12/2015   Procedure: Left Heart Cath and Coronary Angiography;  Surgeon: Belva Crome, MD;  Location: [facility];  Service: Cardiovascular;  Laterality: N/A;   CHOLECYSTECTOMY  50 yrs ago   JOINT REPLACEMENT Right 2005   knee   WISDOM TOOTH EXTRACTION      FAMILY HISTORY: Family History  Problem Relation Age of Onset   Heart attack Father    Heart attack Mother     SOCIAL HISTORY: Social History   Socioeconomic History    Marital status: Widowed    Spouse name: Not on file   Number of children: 2   Years of education: Not on file   Highest education level: Not on file  Occupational History   Not on file  Tobacco Use   Smoking status: Never   Smokeless tobacco: Never  Vaping Use   Vaping Use: Never used  Substance and Sexual Activity   Alcohol use: Never   Drug use: No   Sexual activity: Not on file  Other Topics Concern   Not on file  Social History Narrative   Not on  file   Social Determinants of Health   Financial Resource Strain: Not on file  Food Insecurity: Not on file  Transportation Needs: Not on file  Physical Activity: Not on file  Stress: Not on file  Social Connections: Not on file  Intimate Partner Violence: Not on file    PHYSICAL EXAM  GENERAL EXAM/CONSTITUTIONAL: Vitals:  Vitals:   04/13/22 1029 04/13/22 1032  BP: (!) 138/57 136/79  Pulse: (!) 59 90  Weight: (!) 320 lb 8 oz (145.4 kg)   Height: '5\' 2"'$  (1.575 m)    Body mass index is 58.62 kg/m. Wt Readings from Last 3 Encounters:  04/13/22 (!) 320 lb 8 oz (145.4 kg)  11/14/21 (!) 318 lb (144.2 kg)  11/01/21 (!) 321 lb 6.9 oz (145.8 kg)   Patient is in no distress; well developed, nourished and groomed; neck is supple. There is cerumen bilaterally but not completely obstruction her external ear canal  EYES: Pupils round and reactive to light, Visual fields full to confrontation, Extraocular movements intacts,   MUSCULOSKELETAL: Gait, strength, tone, movements noted in Neurologic exam below  NEUROLOGIC: MENTAL STATUS:      No data to display            04/13/2022   11:21 AM  Montreal Cognitive Assessment   Visuospatial/ Executive (0/5) 3  Naming (0/3) 2  Attention: Read list of digits (0/2) 1  Attention: Read list of letters (0/1) 1  Attention: Serial 7 subtraction starting at 100 (0/3) 3  Language: Repeat phrase (0/2) 2  Language : Fluency (0/1) 1  Abstraction (0/2) 2  Delayed Recall (0/5) 0   Orientation (0/6) 6  Total 21  Adjusted Score (based on education) 21    CRANIAL NERVE:  2nd, 3rd, 4th, 6th - pupils equal and reactive to light, visual fields full to confrontation, extraocular muscles intact, no nystagmus 5th - facial sensation symmetric 7th - facial strength symmetric 8th - hearing intact 9th - palate elevates symmetrically, uvula midline 11th - shoulder shrug symmetric 12th - tongue protrusion midline  MOTOR:  normal bulk and tone, full strength in the BUE, BLE  SENSORY:  normal and symmetric to light touch  COORDINATION:  finger-nose-finger, fine finger movements normal  GAIT/STATION:  Ambulate with a cane    DIAGNOSTIC DATA (LABS, IMAGING, TESTING) - I reviewed patient records, labs, notes, testing and imaging myself where available.  Lab Results  Component Value Date   WBC 5.4 03/23/2022   HGB 11.5 (L) 03/23/2022   HCT 36.3 03/23/2022   MCV 85.8 03/23/2022   PLT 290 03/23/2022      Component Value Date/Time   NA 139 03/23/2022 0950   K 4.4 03/23/2022 0950   CL 99 03/23/2022 0950   CO2 28 03/23/2022 0950   GLUCOSE 170 (H) 03/23/2022 0950   BUN 17 03/23/2022 0950   CREATININE 1.53 (H) 03/23/2022 0950   CREATININE 1.63 (H) 01/27/2020 1044   CALCIUM 9.4 03/23/2022 0950   PROT 6.3 (L) 06/09/2020 1221   ALBUMIN 3.2 (L) 06/09/2020 1221   AST 17 06/09/2020 1221   AST 12 (L) 01/27/2020 1044   ALT 10 06/09/2020 1221   ALT 9 01/27/2020 1044   ALKPHOS 58 06/09/2020 1221   BILITOT 0.6 06/09/2020 1221   BILITOT 0.3 01/27/2020 1044   GFRNONAA 33 (L) 03/23/2022 0950   GFRNONAA 29 (L) 01/27/2020 1044   GFRAA 34 (L) 01/27/2020 1044   Lab Results  Component Value Date   CHOL 92 03/31/2019  HDL 32 (L) 03/31/2019   LDLCALC 45 03/31/2019   TRIG 53 10/11/2019   CHOLHDL 2.9 03/31/2019   Lab Results  Component Value Date   HGBA1C 6.6 (H) 10/09/2015   No results found for: "VITAMINB12" No results found for: "TSH"  Head CT 03/23/22 No acute  intracranial findings are seen in noncontrast CT brain. Atrophy. Small-vessel disease    ASSESSMENT AND PLAN  86 y.o. year old female with history of obesity, hypertension, hyperlipidemia, diabetes, glaucoma, CAD and CKD who is presenting for dizziness and cognitive impairment.  In terms of the patient dizziness, on exam there was no evidence of ataxia or evidence of central cause of the dizziness.  This is likely peripheral.  She is currently taking meclizine every day.  I advised her to discontinue the medication and only take it when she is symptomatic.  I have also advised her to increase her fluid intake. In term of her memory problems, she does report forgetfulness and on exam today she scored 21 out of 30 on the [place].  Patient likely has mild cognitive impairment.  I will start her on Aricept 5 mg nightly for the first month then increase to 10 mg nightly.  She is comfortable with plan.  We discussed side effect of the medication including diarrhea, worsening dizziness and nightmares and advised her to contact me if they have any side effects. Will also obtain TSH and B12 level. Continue other medications, continue to follow with your PCP and return in 1 year or sooner if worse.   1. Mild cognitive disorder   2. Dizziness      Patient Instructions  Start with Aricept 10 (1/2 tablet) nightly for one month then increase to full tablet  Discontinue Meclizine  Increase fluid intake  Continue your other medications  Follow up with your PCP  Return in a year or sooner if worse     There are well-accepted and sensible ways to reduce risk for Alzheimers disease and other degenerative brain disorders .  Exercise Daily Walk A daily 20 minute walk should be part of your routine. Disease related apathy can be a significant roadblock to exercise and the only way to overcome this is to make it a daily routine and perhaps have a reward at the end (something your loved one loves to eat or drink  perhaps) or a personal trainer coming to the home can also be very useful. Most importantly, the patient is much more likely to exercise if the caregiver / spouse does it with him/her. In general a structured, repetitive schedule is best.  General Health: Any diseases which effect your body will effect your brain such as a pneumonia, urinary infection, blood clot, heart attack or stroke. Keep contact with your primary care doctor for regular follow ups.  Sleep. A good nights sleep is healthy for the brain. Seven hours is recommended. If you have insomnia or poor sleep habits we can give you some instructions. If you have sleep apnea wear your mask.  Diet: Eating a heart healthy diet is also a good idea; fish and poultry instead of red meat, nuts (mostly non-peanuts), vegetables, fruits, olive oil or canola oil (instead of butter), minimal salt (use other spices to flavor foods), whole grain rice, bread, cereal and pasta and wine in moderation.Research is now showing that the MIND diet, which is a combination of The Mediterranean diet and the DASH diet, is beneficial for cognitive processing and longevity. Information about this diet can be  found in The MIND Diet, a book by Doyne Keel, MS, RDN, and online at [employer]book[employer].si  Finances, Power of Attorney and Advance Directives: You should consider putting legal safeguards in place with regard to financial and medical decision making. While the spouse always has power of attorney for medical and financial issues in the absence of any form, you should consider what you want in case the spouse / caregiver is no longer around or capable of making decisions.    Orders Placed This Encounter  Procedures   TSH   Vitamin B12    Meds ordered this encounter  Medications   donepezil (ARICEPT) 10 MG tablet    Sig: Take 1 tablet (10 mg total) by mouth at bedtime.    Dispense:  30 tablet    Refill:  3    Return in about 1 year  (around 04/14/2023).  I have spent a total of 75 minutes dedicated to this patient today, preparing to see patient, performing a medically appropriate examination and evaluation, ordering tests and/or medications and procedures, and counseling and educating the patient/family/caregiver; independently interpreting result and communicating results to the family/patient/caregiver; and documenting clinical information in the electronic medical record.    Alric Ran, MD 04/13/2022, 3:45 PM  [facility] [address] [place], [place] [address] [phone]

## 2022-04-13 NOTE — Patient Instructions (Addendum)
Start with Aricept 10 (1/2 tablet) nightly for one month then increase to full tablet  Discontinue Meclizine  Increase fluid intake  Continue your other medications  Follow up with your PCP  Return in a year or sooner if worse     There are well-accepted and sensible ways to reduce risk for Alzheimers disease and other degenerative brain disorders .  Exercise Daily Walk A daily 20 minute walk should be part of your routine. Disease related apathy can be a significant roadblock to exercise and the only way to overcome this is to make it a daily routine and perhaps have a reward at the end (something your loved one loves to eat or drink perhaps) or a personal trainer coming to the home can also be very useful. Most importantly, the patient is much more likely to exercise if the caregiver / spouse does it with him/her. In general a structured, repetitive schedule is best.  General Health: Any diseases which effect your body will effect your brain such as a pneumonia, urinary infection, blood clot, heart attack or stroke. Keep contact with your primary care doctor for regular follow ups.  Sleep. A good nights sleep is healthy for the brain. Seven hours is recommended. If you have insomnia or poor sleep habits we can give you some instructions. If you have sleep apnea wear your mask.  Diet: Eating a heart healthy diet is also a good idea; fish and poultry instead of red meat, nuts (mostly non-peanuts), vegetables, fruits, olive oil or canola oil (instead of butter), minimal salt (use other spices to flavor foods), whole grain rice, bread, cereal and pasta and wine in moderation.Research is now showing that the MIND diet, which is a combination of The Mediterranean diet and the DASH diet, is beneficial for cognitive processing and longevity. Information about this diet can be found in The MIND Diet, a book by Doyne Keel, MS, RDN, and online at [employer].si  Finances,  Power of Attorney and Advance Directives: You should consider putting legal safeguards in place with regard to financial and medical decision making. While the spouse always has power of attorney for medical and financial issues in the absence of any form, you should consider what you want in case the spouse / caregiver is no longer around or capable of making decisions.

## 2022-04-14 LAB — VITAMIN B12: Vitamin B-12: 590 pg/mL (ref 232–1245)

## 2022-04-14 LAB — TSH: TSH: 2.34 u[IU]/mL (ref 0.450–4.500)

## 2022-04-17 ENCOUNTER — Encounter (INDEPENDENT_AMBULATORY_CARE_PROVIDER_SITE_OTHER): Payer: Self-pay

## 2022-04-17 ENCOUNTER — Telehealth: Payer: Self-pay | Admitting: *Deleted

## 2022-04-17 NOTE — Progress Notes (Signed)
Please call and advise the patient that the recent TSH and Vitamin B12 level we checked were within normal limits. No further action is required on these tests at this time. Please remind patient to keep any upcoming appointments or tests and to call us with any interim questions, concerns, problems or updates. Thanks,   Alric Ran, MD

## 2022-04-17 NOTE — Telephone Encounter (Signed)
-----   Message from Alric Ran, MD sent at 04/17/2022  9:08 AM EDT ----- Please call and advise the patient that the recent TSH and Vitamin B12 level we checked were within normal limits. No further action is required on these tests at this time. Please remind patient to keep any upcoming appointments or tests and to call us with any interim questions, concerns, problems or updates. Thanks,   Alric Ran, MD

## 2022-04-17 NOTE — Telephone Encounter (Signed)
I called pt and we discussed labs. She verbalized understanding and appreciation for the call.

## 2022-04-20 ENCOUNTER — Encounter (INDEPENDENT_AMBULATORY_CARE_PROVIDER_SITE_OTHER): Payer: Medicare Other | Admitting: Ophthalmology

## 2022-05-02 ENCOUNTER — Encounter (INDEPENDENT_AMBULATORY_CARE_PROVIDER_SITE_OTHER): Payer: Self-pay | Admitting: Ophthalmology

## 2022-05-02 ENCOUNTER — Ambulatory Visit (INDEPENDENT_AMBULATORY_CARE_PROVIDER_SITE_OTHER): Payer: Medicare Other | Admitting: Ophthalmology

## 2022-05-02 DIAGNOSIS — E113393 Type 2 diabetes mellitus with moderate nonproliferative diabetic retinopathy without macular edema, bilateral: Secondary | ICD-10-CM | POA: Diagnosis not present

## 2022-05-02 DIAGNOSIS — G4733 Obstructive sleep apnea (adult) (pediatric): Secondary | ICD-10-CM

## 2022-05-02 DIAGNOSIS — H35372 Puckering of macula, left eye: Secondary | ICD-10-CM

## 2022-05-02 DIAGNOSIS — H43813 Vitreous degeneration, bilateral: Secondary | ICD-10-CM | POA: Diagnosis not present

## 2022-05-02 NOTE — Assessment & Plan Note (Signed)
The nature of moderate nonproliferative diabetic retinopathy was discussed with the patient as well as the need for more frequent follow up to judge for progression. Good blood glucose, blood pressure, and serum lipid control was recommended as well as avoidance of smoking and maintenance of normal weight.  Close follow up with PCP encouraged. °

## 2022-05-02 NOTE — Assessment & Plan Note (Signed)

## 2022-05-02 NOTE — Assessment & Plan Note (Signed)
Patient is compliant with CPAP

## 2022-05-02 NOTE — Progress Notes (Signed)
05/02/2022     CHIEF COMPLAINT Patient presents for  Chief Complaint  Patient presents with   Diabetic Retinopathy without Macular Edema      HISTORY OF PRESENT ILLNESS: Jeanne [name] is a 86 y.o. female who presents to the clinic today for:   HPI   2 yr FU OCT OU-(Recent issues wanted to double check vision.) Pt stated no vision changes. Pt reports, "ive had some dizzy spells and when I take a shower and I would lean in, its over for me. I start to feel dizzy. When I start feeling dizzy, I would close my eyes and start to calm myself. Without my glasses, I start to see double vision in my left eye. I would start seeing flashes of light in my left eyes. Id blink a couple of times and try to calm myself down and the flashes of light would slowly diminish. That was a week ago."   Last edited by Silvestre Moment on 05/02/2022  3:21 PM.      Referring physician: Rutherford Guys, [place],  [place] [address]  HISTORICAL INFORMATION:   Selected notes from the MEDICAL RECORD NUMBER    Lab Results  Component Value Date   HGBA1C 6.6 (H) 10/09/2015     CURRENT MEDICATIONS: Current Outpatient Medications (Ophthalmic Drugs)  Medication Sig   latanoprost (XALATAN) 0.005 % ophthalmic solution INSTILL 1 DROP IN LEFT EYE NIGHTLY   Polyvinyl Alcohol-Povidone (REFRESH OP) Place 1 drop into both eyes at bedtime.   No current facility-administered medications for this visit. (Ophthalmic Drugs)   Current Outpatient Medications (Other)  Medication Sig   allopurinol (ZYLOPRIM) 100 MG tablet Take 100 mg by mouth daily.   aspirin EC 81 MG tablet Take 1 tablet (81 mg total) by mouth daily. Swallow whole.   Cholecalciferol (VITAMIN D3) 50 MCG (2000 UT) TABS Take 2,000 Units by mouth daily.    dapagliflozin propanediol (FARXIGA) 10 MG TABS tablet 1 tablet   diltiazem (TIAZAC) 120 MG 24 hr capsule Take 240 mg by mouth daily.   donepezil (ARICEPT) 10 MG tablet Take 1 tablet (10 mg  total) by mouth at bedtime.   esomeprazole (NEXIUM) 20 MG capsule TAKE 1 CAPSULE (20 MG TOTAL) BY MOUTH DAILY AT 12 NOON.   ezetimibe (ZETIA) 10 MG tablet Take 10 mg by mouth daily.   ferrous sulfate 325 (65 FE) MG tablet Take 325 mg by mouth daily with breakfast.    furosemide (LASIX) 20 MG tablet TAKE 1 TABLET BY MOUTH EVERY DAY   hydrALAZINE (APRESOLINE) 25 MG tablet TAKE 1 TABLET BY MOUTH THREE TIMES A DAY   insulin glargine (LANTUS) 100 UNIT/ML injection Inject 65 Units into the skin daily before breakfast.   isosorbide mononitrate (IMDUR) 30 MG 24 hr tablet Take 1 tablet (30 mg total) by mouth daily.   losartan (COZAAR) 25 MG tablet TAKE 2 TABLETS BY MOUTH DAILY   meclizine (ANTIVERT) 25 MG tablet Take 1 tablet (25 mg total) by mouth 3 (three) times daily as needed for dizziness.   nitroGLYCERIN (NITROSTAT) 0.4 MG SL tablet Place 1 tablet (0.4 mg total) under the tongue as needed for chest pain.   oxybutynin (DITROPAN-XL) 10 MG 24 hr tablet Take 10 mg by mouth daily.   No current facility-administered medications for this visit. (Other)      REVIEW OF SYSTEMS: ROS   Negative for: Constitutional, Gastrointestinal, Neurological, Skin, Genitourinary, Musculoskeletal, HENT, Endocrine, Cardiovascular, Eyes, Respiratory, Psychiatric, Allergic/Imm, Heme/Lymph  Last edited by Silvestre Moment on 05/02/2022  3:21 PM.       ALLERGIES Allergies  Allergen Reactions   Amoxicillin Nausea Only    Has patient had a PCN reaction causing immediate rash, facial/tongue/throat swelling, SOB or lightheadedness with hypotension: "yes, i was dizzy" Has patient had a PCN reaction causing severe rash involving mucus membranes or skin necrosis: no Did a PCN reaction that required hospitalization : no, called the DR. Did PCN reaction occurring within the last 10 years: unk if all of the above answers are "NO", then may proceed with Cephalosporin use.    Enalapril Maleate     Other reaction(s): angioedema    Invokana [Canagliflozin] Nausea Only and Other (See Comments)    Dizziness    Liraglutide     Other reaction(s): Nausea and dizziness   Pioglitazone     Other reaction(s): macular edema   Tramadol Hcl     Other reaction(s): nausea, headache and dizziness    PAST MEDICAL HISTORY Past Medical History:  Diagnosis Date   Anemia    Arthritis    Carotid arterial disease ([place])    COVID-19    Diabetes mellitus    Hyperlipidemia    Hypertension    Migraine    Obesity    Reflux    Sleep apnea    Past Surgical History:  Procedure Laterality Date   CARDIAC CATHETERIZATION N/A 10/12/2015   Procedure: Left Heart Cath and Coronary Angiography;  Surgeon: Belva Crome, MD;  Location: [facility];  Service: Cardiovascular;  Laterality: N/A;   CHOLECYSTECTOMY  50 yrs ago   JOINT REPLACEMENT Right 2005   knee   WISDOM TOOTH EXTRACTION      FAMILY HISTORY Family History  Problem Relation Age of Onset   Heart attack Father    Heart attack Mother     SOCIAL HISTORY Social History   Tobacco Use   Smoking status: Never   Smokeless tobacco: Never  Vaping Use   Vaping Use: Never used  Substance Use Topics   Alcohol use: Never   Drug use: No         OPHTHALMIC EXAM:  Base Eye Exam     Visual Acuity (ETDRS)       Right Left   Dist cc 20/20 -2 20/25 +1    Correction: Glasses         Tonometry (Tonopen, 3:28 PM)       Right Left   Pressure 15 17         Pupils       Pupils APD   Right PERRL None   Left PERRL None         Visual Fields       Left Right    Full Full         Extraocular Movement       Right Left    Full Full         Neuro/Psych     Oriented x3: Yes   Mood/Affect: Normal         Dilation     Both eyes: 1.0% Mydriacyl, 2.5% Phenylephrine @ 3:27 PM           Slit Lamp and Fundus Exam     External Exam       Right Left   External Normal Normal         Slit Lamp Exam       Right Left   Lids/Lashes  Normal Normal   Conjunctiva/Sclera White and quiet White and quiet   Cornea Clear Clear   Anterior Chamber Deep and quiet Deep and quiet   Iris Round and reactive Round and reactive   Lens Posterior chamber intraocular lens Posterior chamber intraocular lens   Anterior Vitreous Normal Normal            IMAGING AND PROCEDURES  Imaging and Procedures for 05/02/22  OCT, Retina - OU - Both Eyes       Right Eye Quality was good. Scan locations included subfoveal. Central Foveal Thickness: 221. Progression has been stable. Findings include normal foveal contour.   Left Eye Quality was good. Scan locations included subfoveal. Central Foveal Thickness: 249. Progression has been stable. Findings include normal foveal contour.   Notes No active maculopathy in either eye.  No focal laser photocoagulation scars inferotemporal              ASSESSMENT/PLAN:  Sleep apnea Patient is compliant with CPAP  Posterior vitreous detachment of both eyes  The nature of posterior vitreous detachment was discussed with the patient as well as its physiology, its age prevalence, and its possible implication regarding retinal breaks and detachment.  An informational brochure was offered to the patient.  All the patient's questions were answered.  The patient was asked to return if new or different flashes or floaters develops.   Patient was instructed to contact office immediately if any new changes were noticed. I explained to the patient that vitreous inside the eye is similar to jello inside a bowl. As the jello melts it can start to pull away from the bowl, similarly the vitreous throughout our lives can begin to pull away from the retina. That process is called a posterior vitreous detachment. In some cases, the vitreous can tug hard enough on the retina to form a retinal tear. I discussed with the patient the signs and symptoms of a retinal detachment.  Do not rub the eye.    Moderate  nonproliferative diabetic retinopathy of both eyes (HCC) The nature of moderate nonproliferative diabetic retinopathy was discussed with the patient as well as the need for more frequent follow up to judge for progression. Good blood glucose, blood pressure, and serum lipid control was recommended as well as avoidance of smoking and maintenance of normal weight.  Close follow up with PCP encouraged.  Left epiretinal membrane Minor r OS no topographic distortion     ICD-10-CM   1. Moderate nonproliferative diabetic retinopathy of both eyes without macular edema associated with type 2 diabetes mellitus (HCC)  M08.6761 OCT, Retina - OU - Both Eyes    2. Obstructive sleep apnea syndrome  G47.33     3. Posterior vitreous detachment of both eyes  H43.813     4. Left epiretinal membrane  H35.372       1.  No active maculopathy OU.  2.  Excellent visual acuity distance OU.  3.  Stable moderate NPDR OU.  Ophthalmic Meds Ordered this visit:  No orders of the defined types were placed in this encounter.      Return in about 1 year (around 05/03/2023) for DILATE OU, OCT, COLOR FP.  There are no Patient Instructions on file for this visit.   Explained the diagnoses, plan, and follow up with the patient and they expressed understanding.  Patient expressed understanding of the importance of proper follow up care.   Clent Demark Shantasia Hunnell M.D. Diseases & Surgery of the Retina and Vitreous  [facility] [place] 05/02/22     Abbreviations: M myopia (nearsighted); A astigmatism; H hyperopia (farsighted); P presbyopia; Mrx spectacle prescription;  CTL contact lenses; OD right eye; OS left eye; OU both eyes  XT exotropia; ET esotropia; PEK punctate epithelial keratitis; PEE punctate epithelial erosions; DES dry eye syndrome; MGD meibomian gland dysfunction; ATs artificial tears; PFAT's preservative free artificial tears; [place] nuclear sclerotic cataract; PSC posterior subcapsular cataract; ERM  epi-retinal membrane; PVD posterior vitreous detachment; RD retinal detachment; DM diabetes mellitus; DR diabetic retinopathy; NPDR non-proliferative diabetic retinopathy; PDR proliferative diabetic retinopathy; CSME clinically significant macular edema; DME diabetic macular edema; dbh dot blot hemorrhages; CWS cotton wool spot; POAG primary open angle glaucoma; C/D cup-to-disc ratio; HVF humphrey visual field; GVF goldmann visual field; OCT optical coherence tomography; IOP intraocular pressure; BRVO Branch retinal vein occlusion; CRVO central retinal vein occlusion; CRAO central retinal artery occlusion; BRAO branch retinal artery occlusion; RT retinal tear; SB scleral buckle; PPV pars plana vitrectomy; VH Vitreous hemorrhage; PRP panretinal laser photocoagulation; IVK intravitreal kenalog; VMT vitreomacular traction; MH Macular hole;  NVD neovascularization of the disc; NVE neovascularization elsewhere; AREDS age related eye disease study; ARMD age related macular degeneration; POAG primary open angle glaucoma; EBMD epithelial/anterior basement membrane dystrophy; ACIOL anterior chamber intraocular lens; IOL intraocular lens; PCIOL posterior chamber intraocular lens; Phaco/IOL phacoemulsification with intraocular lens placement; Parchment photorefractive keratectomy; LASIK laser assisted in situ keratomileusis; HTN hypertension; DM diabetes mellitus; COPD chronic obstructive pulmonary disease

## 2022-05-02 NOTE — Assessment & Plan Note (Addendum)
Minor r OS no topographic distortion

## 2022-05-11 ENCOUNTER — Other Ambulatory Visit: Payer: Self-pay

## 2022-05-11 ENCOUNTER — Emergency Department (HOSPITAL_COMMUNITY)
Admission: EM | Admit: 2022-05-11 | Discharge: 2022-05-11 | Disposition: A | Discharge status: Home / Self Care | Payer: Medicare Other | Attending: Student | Admitting: Student

## 2022-05-11 ENCOUNTER — Emergency Department (HOSPITAL_COMMUNITY): Payer: Medicare Other

## 2022-05-11 DIAGNOSIS — Z8616 Personal history of COVID-19: Secondary | ICD-10-CM | POA: Diagnosis not present

## 2022-05-11 DIAGNOSIS — Z79899 Other long term (current) drug therapy: Secondary | ICD-10-CM | POA: Insufficient documentation

## 2022-05-11 DIAGNOSIS — R42 Dizziness and giddiness: Secondary | ICD-10-CM | POA: Diagnosis present

## 2022-05-11 DIAGNOSIS — I5032 Chronic diastolic (congestive) heart failure: Secondary | ICD-10-CM | POA: Diagnosis not present

## 2022-05-11 DIAGNOSIS — N183 Chronic kidney disease, stage 3 unspecified: Secondary | ICD-10-CM | POA: Insufficient documentation

## 2022-05-11 DIAGNOSIS — E1122 Type 2 diabetes mellitus with diabetic chronic kidney disease: Secondary | ICD-10-CM | POA: Diagnosis not present

## 2022-05-11 DIAGNOSIS — H8110 Benign paroxysmal vertigo, unspecified ear: Secondary | ICD-10-CM | POA: Insufficient documentation

## 2022-05-11 DIAGNOSIS — I2510 Atherosclerotic heart disease of native coronary artery without angina pectoris: Secondary | ICD-10-CM | POA: Diagnosis not present

## 2022-05-11 DIAGNOSIS — I130 Hypertensive heart and chronic kidney disease with heart failure and stage 1 through stage 4 chronic kidney disease, or unspecified chronic kidney disease: Secondary | ICD-10-CM | POA: Insufficient documentation

## 2022-05-11 DIAGNOSIS — Z96651 Presence of right artificial knee joint: Secondary | ICD-10-CM | POA: Insufficient documentation

## 2022-05-11 DIAGNOSIS — Z794 Long term (current) use of insulin: Secondary | ICD-10-CM | POA: Diagnosis not present

## 2022-05-11 DIAGNOSIS — Z7982 Long term (current) use of aspirin: Secondary | ICD-10-CM | POA: Insufficient documentation

## 2022-05-11 LAB — CBC WITH DIFFERENTIAL/PLATELET
Abs Immature Granulocytes: 0.05 10*3/uL (ref 0.00–0.07)
Basophils Absolute: 0.1 10*3/uL (ref 0.0–0.1)
Basophils Relative: 1 %
Eosinophils Absolute: 0.2 10*3/uL (ref 0.0–0.5)
Eosinophils Relative: 2 %
HCT: 37 % (ref 36.0–46.0)
Hemoglobin: 11.4 g/dL — ABNORMAL LOW (ref 12.0–15.0)
Immature Granulocytes: 1 %
Lymphocytes Relative: 22 %
Lymphs Abs: 1.9 10*3/uL (ref 0.7–4.0)
MCH: 26.7 pg (ref 26.0–34.0)
MCHC: 30.8 g/dL (ref 30.0–36.0)
MCV: 86.7 fL (ref 80.0–100.0)
Monocytes Absolute: 0.7 10*3/uL (ref 0.1–1.0)
Monocytes Relative: 9 %
Neutro Abs: 5.5 10*3/uL (ref 1.7–7.7)
Neutrophils Relative %: 65 %
Platelets: 286 10*3/uL (ref 150–400)
RBC: 4.27 MIL/uL (ref 3.87–5.11)
RDW: 14.3 % (ref 11.5–15.5)
WBC: 8.4 10*3/uL (ref 4.0–10.5)
nRBC: 0 % (ref 0.0–0.2)

## 2022-05-11 LAB — BASIC METABOLIC PANEL
Anion gap: 10 (ref 5–15)
BUN: 24 mg/dL — ABNORMAL HIGH (ref 8–23)
CO2: 27 mmol/L (ref 22–32)
Calcium: 9 mg/dL (ref 8.9–10.3)
Chloride: 100 mmol/L (ref 98–111)
Creatinine, Ser: 1.5 mg/dL — ABNORMAL HIGH (ref 0.44–1.00)
GFR, Estimated: 34 mL/min — ABNORMAL LOW (ref >60)
Glucose, Bld: 109 mg/dL — ABNORMAL HIGH (ref 70–99)
Potassium: 4 mmol/L (ref 3.5–5.1)
Sodium: 137 mmol/L (ref 135–145)

## 2022-05-11 LAB — TROPONIN I (HIGH SENSITIVITY): Troponin I (High Sensitivity): 17 ng/L (ref <18)

## 2022-05-11 NOTE — ED Provider Triage Note (Signed)
Emergency Medicine Provider Triage Evaluation Note  Jeanne [name] , a 86 y.o. female  was evaluated in triage.  Pt complains of vertigo x2 weeks.  Also having a headache which was worse headache of life starting yesterday.  Endorsing chest pain which comes and goes mostly concerned about the vertigo.  Review of Systems  Per HPI  Physical Exam  BP (!) 161/67 (BP Location: Left Arm)   Pulse 81   Temp 98.7 F (37.1 C) (Oral)   Resp 20   SpO2 96%  Gen:   Awake, no distress   Resp:  Normal effort  MSK:   Moves extremities without difficulty  Other:  CN II through XII are grossly intact.  Grips equal bilaterally, lower extremity strength equal bilaterally.  S1-S2  Medical Decision Making  Medically screening exam initiated at 5:48 PM.  Appropriate orders placed.  CHERAL CAPPUCCI was informed that the remainder of the evaluation will be completed by another provider, this initial triage assessment does not replace that evaluation, and the importance of remaining in the ED until their evaluation is complete.     Sherrill Raring, PA-C 05/11/22 1749

## 2022-05-11 NOTE — ED Provider Notes (Signed)
[facility] EMERGENCY DEPARTMENT Provider Note  CSN: [id number] Arrival date & time: 05/11/22 1716  Chief Complaint(s) Chest Pain and Headache  HPI Jeanne [name] is a 86 y.o. female with PMH anemia, arthritis, T2DM, HTN, HLD, migraines who presents emergency department for evaluation vertigo, chest pain and headache.  Patient states that she has been struggling with vertigo for a while now and recently saw neurologist who started the patient on Aricept.  She states that this morning she awoke with a feeling of vertigo worse with head movements that improved with keeping her head still.  She had associated chest pain nausea and headache at that time, but by the time that I evaluated the patient here in the emergency department, her symptoms have completely resolved.  She currently denies chest pain, shortness of breath, abdominal pain, nausea, vomiting or other systemic symptoms.  Of note, the patient is on multiple medications that would cause her to have orthostatic hypotension including Farxiga, diltiazem, Lasix, Imdur.  Past Medical History Past Medical History:  Diagnosis Date   Anemia    Arthritis    Carotid arterial disease ([place])    COVID-19    Diabetes mellitus    Hyperlipidemia    Hypertension    Migraine    Obesity    Reflux    Sleep apnea    Patient Active Problem List   Diagnosis Date Noted   Gastroesophageal reflux disease 05/02/2021   Chronic diastolic heart failure ([place]) 10/07/2020   Moderate nonproliferative diabetic retinopathy of both eyes (HCC) 05/25/2020   Chorioretinal scar 05/25/2020   Left epiretinal membrane 05/25/2020   Posterior vitreous detachment of both eyes 05/25/2020   Acute respiratory failure with hypoxia ([place]) 10/11/2019   CAP (community acquired pneumonia) 30/86/5784   Diastolic dysfunction, left ventricle 04/05/2019   Coronary artery disease of native artery of native heart with stable angina pectoris ([place]) 11/23/2018    Hypercholesteremia 11/23/2018   Unstable angina ([place]) 10/12/2015   Abnormal myocardial perfusion study 10/12/2015   NSVT (nonsustained ventricular tachycardia) ([place]) 10/11/2015   Primary hypertension 10/11/2015   CKD (chronic kidney disease), stage III ([place]) 10/11/2015   Atypical chest pain    Angina pectoris ([place]) 10/08/2015   Diabetes mellitus type II, non insulin dependent ([place]) 10/08/2015   AKI (acute kidney injury) ([place])    Abscess of left arm 01/01/2013   Migraine    Morbid obesity ([place])    Sleep apnea    Stenosis of left carotid artery    Reflux    Anemia    Home Medication(s) Prior to Admission medications   Medication Sig Start Date End Date Taking? Authorizing Provider  allopurinol (ZYLOPRIM) 100 MG tablet Take 100 mg by mouth daily. 05/27/20   [provider]  aspirin EC 81 MG tablet Take 1 tablet (81 mg total) by mouth daily. Swallow whole. 11/14/21   Patwardhan, Reynold Bowen, MD  Cholecalciferol (VITAMIN D3) 50 MCG (2000 UT) TABS Take 2,000 Units by mouth daily.  07/30/19   [provider]  dapagliflozin propanediol (FARXIGA) 10 MG TABS tablet 1 tablet 07/08/21   [provider]  diltiazem (TIAZAC) 120 MG 24 hr capsule Take 240 mg by mouth daily. 09/08/19   [provider]  donepezil (ARICEPT) 10 MG tablet Take 1 tablet (10 mg total) by mouth at bedtime. 04/13/22   Alric Ran, MD  esomeprazole (NEXIUM) 20 MG capsule TAKE 1 CAPSULE (20 MG TOTAL) BY MOUTH DAILY AT 12 NOON. 11/10/21   Patwardhan, Manish  J, MD  ezetimibe (ZETIA) 10 MG tablet Take 10 mg by mouth daily.    [provider]  ferrous sulfate 325 (65 FE) MG tablet Take 325 mg by mouth daily with breakfast.  07/30/19   [provider]  furosemide (LASIX) 20 MG tablet TAKE 1 TABLET BY MOUTH EVERY DAY 12/01/21   Patwardhan, Manish J, MD  hydrALAZINE (APRESOLINE) 25 MG tablet TAKE 1 TABLET BY MOUTH THREE TIMES A DAY 03/27/22   Patwardhan, Manish J, MD  insulin glargine  (LANTUS) 100 UNIT/ML injection Inject 65 Units into the skin daily before breakfast.    [provider]  isosorbide mononitrate (IMDUR) 30 MG 24 hr tablet Take 1 tablet (30 mg total) by mouth daily. 07/08/20 11/14/21  Patwardhan, Reynold Bowen, MD  latanoprost (XALATAN) 0.005 % ophthalmic solution INSTILL 1 DROP IN LEFT EYE NIGHTLY 12/05/21   Rankin, Clent Demark, MD  losartan (COZAAR) 25 MG tablet TAKE 2 TABLETS BY MOUTH DAILY 01/06/22   Patwardhan, Reynold Bowen, MD  meclizine (ANTIVERT) 25 MG tablet Take 1 tablet (25 mg total) by mouth 3 (three) times daily as needed for dizziness. 03/23/22   Nelta Numbers, MD  nitroGLYCERIN (NITROSTAT) 0.4 MG SL tablet Place 1 tablet (0.4 mg total) under the tongue as needed for chest pain. 06/08/21   Patwardhan, Reynold Bowen, MD  oxybutynin (DITROPAN-XL) 10 MG 24 hr tablet Take 10 mg by mouth daily.    [provider]  Polyvinyl Alcohol-Povidone (REFRESH OP) Place 1 drop into both eyes at bedtime.    [provider]                                                                                                                                    Past Surgical History Past Surgical History:  Procedure Laterality Date   CARDIAC CATHETERIZATION N/A 10/12/2015   Procedure: Left Heart Cath and Coronary Angiography;  Surgeon: Belva Crome, MD;  Location: [facility];  Service: Cardiovascular;  Laterality: N/A;   CHOLECYSTECTOMY  50 yrs ago   JOINT REPLACEMENT Right 2005   knee   WISDOM TOOTH EXTRACTION     Family History Family History  Problem Relation Age of Onset   Heart attack Father    Heart attack Mother     Social History Social History   Tobacco Use   Smoking status: Never   Smokeless tobacco: Never  Vaping Use   Vaping Use: Never used  Substance Use Topics   Alcohol use: Never   Drug use: No   Allergies Amoxicillin, Enalapril maleate, Invokana [canagliflozin], Liraglutide, Pioglitazone, and Tramadol hcl  Review of  Systems Review of Systems  Cardiovascular:  Positive for chest pain.  Neurological:  Positive for dizziness.    Physical Exam Vital Signs  I have reviewed the triage vital signs BP (!) 149/64   Pulse 88   Temp 98.7 F (37.1 C) (Oral)   Resp  20   SpO2 95%   Physical Exam Vitals and nursing note reviewed.  Constitutional:      General: She is not in acute distress.    Appearance: She is well-developed.  HENT:     Head: Normocephalic and atraumatic.  Eyes:     Conjunctiva/sclera: Conjunctivae normal.  Cardiovascular:     Rate and Rhythm: Normal rate and regular rhythm.     Heart sounds: No murmur heard. Pulmonary:     Effort: Pulmonary effort is normal. No respiratory distress.     Breath sounds: Normal breath sounds.  Abdominal:     Palpations: Abdomen is soft.     Tenderness: There is no abdominal tenderness.  Musculoskeletal:        General: No swelling.     Cervical back: Neck supple.  Skin:    General: Skin is warm and dry.     Capillary Refill: Capillary refill takes less than 2 seconds.  Neurological:     General: No focal deficit present.     Mental Status: She is alert.  Psychiatric:        Mood and Affect: Mood normal.     ED Results and Treatments Labs (all labs ordered are listed, but only abnormal results are displayed) Labs Reviewed  BASIC METABOLIC PANEL - Abnormal; Notable for the following components:      Result Value   Glucose, Bld 109 (*)    BUN 24 (*)    Creatinine, Ser 1.50 (*)    GFR, Estimated 34 (*)    All other components within normal limits  CBC WITH DIFFERENTIAL/PLATELET - Abnormal; Notable for the following components:   Hemoglobin 11.4 (*)    All other components within normal limits  TROPONIN I (HIGH SENSITIVITY)                                                                                                                          Radiology CT Head Wo Contrast  Result Date: 05/11/2022 CLINICAL DATA:  Dizziness, headache,  vertigo EXAM: CT HEAD WITHOUT CONTRAST TECHNIQUE: Contiguous axial images were obtained from the base of the skull through the vertex without intravenous contrast. RADIATION DOSE REDUCTION: This exam was performed according to the departmental dose-optimization program which includes automated exposure control, adjustment of the mA and/or kV according to patient size and/or use of iterative reconstruction technique. COMPARISON:  03/23/2022 FINDINGS: Brain: No evidence of acute infarction, hemorrhage, hydrocephalus, extra-axial collection or mass lesion/mass effect. Subcortical white matter and periventricular small vessel ischemic changes. Empty sella. Vascular: Intracranial atherosclerosis. Skull: Normal. Negative for fracture or focal lesion. Sinuses/Orbits: The visualized paranasal sinuses are essentially clear. The mastoid air cells are unopacified. Other: None. IMPRESSION: No acute intracranial abnormality. Small vessel ischemic changes. Electronically Signed   By: Julian Hy M.D.   On: 05/11/2022 19:19   DG Chest 2 View  Result Date: 05/11/2022 CLINICAL DATA:  Chest pain and headache. EXAM: CHEST - 2 VIEW COMPARISON:  11/01/2021  FINDINGS: Heart is enlarged, stable in configuration. The lungs are free of focal consolidations and pleural effusions. No pulmonary edema. IMPRESSION: Stable cardiomegaly.  No evidence for acute pulmonary abnormality. Electronically Signed   By: Nolon Nations M.D.   On: 05/11/2022 18:54    Pertinent labs & imaging results that were available during my care of the patient were reviewed by me and considered in my medical decision making (see MDM for details).  Medications Ordered in ED Medications - No data to display                                                                                                                                   Procedures Procedures  (including critical care time)  Medical Decision Making / ED Course   This patient presents  to the ED for concern of vertigo, chest pain, headache, this involves an extensive number of treatment options, and is a complaint that carries with it a high risk of complications and morbidity.  The differential diagnosis includes benign positional paroxysmal vertigo, vestibular neuritis, polypharmacy with orthostatic presyncope, ACS CVA  MDM: Patient seen in the emergency room for evaluation of multiple complaints described above.  Physical exam unremarkable with no focal motor or sensory deficits.  No cranial nerve deficits and no nystagmus.  Cardiopulmonary exam unremarkable.  Laboratory evaluation with a hemoglobin of 11.4 which is baseline for the patient and creatinine of 1.5 which is also baseline for this patient.  High-sensitivity troponin is normal.  ECG with no ST elevations or depressions and overall nonischemic.  Chest x-ray unremarkable.  CT head unremarkable.  Given that the patient has positional fatigable vertiginous symptoms, I have higher suspicion for peripheral vertigo at this time and we had a long discussion about Epley maneuvers and how to perform these at home.  She has ENT follow-up in 1 week where she can discuss the symptoms with her ENT.  I also suspect that there may be some element of polypharmacy given multiple medications that can cause orthostasis.  Patient encouraged to talk to her primary care physician about this.  Patient then discharged.   Additional history obtained: -Additional history obtained from family member -External records from outside source obtained and reviewed including: Chart review including previous notes, labs, imaging, consultation notes   Lab Tests: -I ordered, reviewed, and interpreted labs.   The pertinent results include:   Labs Reviewed  BASIC METABOLIC PANEL - Abnormal; Notable for the following components:      Result Value   Glucose, Bld 109 (*)    BUN 24 (*)    Creatinine, Ser 1.50 (*)    GFR, Estimated 34 (*)    All other  components within normal limits  CBC WITH DIFFERENTIAL/PLATELET - Abnormal; Notable for the following components:   Hemoglobin 11.4 (*)    All other components within normal limits  TROPONIN I (HIGH SENSITIVITY)  EKG   EKG Interpretation  Date/Time:  Thursday May 11 2022 17:27:34 EDT Ventricular Rate:  85 PR Interval:  166 QRS Duration: 86 QT Interval:  372 QTC Calculation: 442 R Axis:   -59 Text Interpretation: Sinus rhythm with Premature atrial complexes Left anterior fascicular block Abnormal ECG When compared with ECG of 23-Mar-2022 09:40, PREVIOUS ECG IS PRESENT Confirmed by [place] ([id number]) on 05/11/2022 11:43:43 PM         Imaging Studies ordered: I ordered imaging studies including chest x-ray, CT head I independently visualized and interpreted imaging. I agree with the radiologist interpretation   Medicines ordered and prescription drug management: No orders of the defined types were placed in this encounter.   -I have reviewed the patients home medicines and have made adjustments as needed  Critical interventions none  Cardiac Monitoring: The patient was maintained on a cardiac monitor.  I personally viewed and interpreted the cardiac monitored which showed an underlying rhythm of: NSR  Social Determinants of Health:  Factors impacting patients care include: none   Reevaluation: After the interventions noted above, I reevaluated the patient and found that they have :improved  Co morbidities that complicate the patient evaluation  Past Medical History:  Diagnosis Date   Anemia    Arthritis    Carotid arterial disease ([place])    COVID-19    Diabetes mellitus    Hyperlipidemia    Hypertension    Migraine    Obesity    Reflux    Sleep apnea       Dispostion: I considered admission for this patient, and with reassuring work-up in the emergency department, patient does not meet inpatient criteria for admission is safe for discharge  with outpatient follow-up.     Final Clinical Impression(s) / ED Diagnoses Final diagnoses:  Benign paroxysmal positional vertigo, unspecified laterality     '@PCDICTATION'$ @    Teressa Lower, MD 05/11/22 2345

## 2022-05-11 NOTE — ED Triage Notes (Signed)
Pt here for generalized cp and HA that started around 0300 today, pt reports she thought her vertigo was acting up because she has had a flare up recently. Pt denies radiation, no sob, N/V. Pt's daughter gave her 1 SL nitro approx 1 hour ago, pt reports some relief.

## 2022-05-15 ENCOUNTER — Ambulatory Visit: Payer: Medicare Other | Admitting: Podiatry

## 2022-05-17 DIAGNOSIS — H6123 Impacted cerumen, bilateral: Secondary | ICD-10-CM | POA: Insufficient documentation

## 2022-05-17 DIAGNOSIS — R42 Dizziness and giddiness: Secondary | ICD-10-CM | POA: Insufficient documentation

## 2022-05-18 ENCOUNTER — Telehealth: Payer: Self-pay | Admitting: Neurology

## 2022-05-18 ENCOUNTER — Other Ambulatory Visit: Payer: Self-pay | Admitting: Cardiology

## 2022-05-18 DIAGNOSIS — R0789 Other chest pain: Secondary | ICD-10-CM

## 2022-05-18 NOTE — Telephone Encounter (Signed)
I called pt's daughter. No answer, left a message asking her to call me back.

## 2022-05-18 NOTE — Telephone Encounter (Signed)
I returned the pt's daughter and advised it would be hard to know if the nightmare came from the 10 mg of aricept since this was an isolated event. I advised she could decrease to 1/2 tablet for the next 2-3 nights and then try the 10 mg tab again to see if this happens again. Daughter agreed to this plan.  Of note, she started the aricept 5 mg on 04/13/2022 and increased to 10 mg on 05/14/2022. She will let us know how she fares.

## 2022-05-18 NOTE — Telephone Encounter (Signed)
Pt's daughter, Jamerica Snavely (on [place]) Mother ask me to call Dr. April Manson because she had bad dream Want to know if she needs to go back to a half tablet or what the next step would be. Would like a call from the nurse.

## 2022-05-18 NOTE — Telephone Encounter (Signed)
Pt returned phone call, would like a call back to discuss donepezil (ARICEPT) 10 MG tablet.

## 2022-05-19 NOTE — Telephone Encounter (Signed)
Agree with plan 

## 2022-05-22 NOTE — Telephone Encounter (Signed)
Agree with stopping the medication. I will see them as scheduled on 10/12. Thanks

## 2022-05-22 NOTE — Telephone Encounter (Signed)
Pt's daughter, Marillyn Goren concerned about her being on Aricept. Pt having some side effects, hallucinations, slightly agitated. Would like a call from the nurse to discuss a work in appt.

## 2022-05-22 NOTE — Telephone Encounter (Signed)
I returned pt's daughter call. She sts over the weekend the pt had another vivid dream and woke up the next day hallucinating about the things from her dream.  At this point the she would like to d/c the med and bring her mom in for a f/u appt with Dr. April Manson.  I advised we could see her again on 06/15/2022 at 345 and will update MD that med has been stopped.   Will monitor the schedule for a sooner appt if open.

## 2022-05-23 ENCOUNTER — Encounter (INDEPENDENT_AMBULATORY_CARE_PROVIDER_SITE_OTHER): Payer: Medicare Other | Admitting: Ophthalmology

## 2022-06-01 ENCOUNTER — Encounter (INDEPENDENT_AMBULATORY_CARE_PROVIDER_SITE_OTHER): Payer: Self-pay

## 2022-06-05 ENCOUNTER — Ambulatory Visit (INDEPENDENT_AMBULATORY_CARE_PROVIDER_SITE_OTHER): Payer: Medicare Other | Admitting: Ophthalmology

## 2022-06-05 ENCOUNTER — Encounter (INDEPENDENT_AMBULATORY_CARE_PROVIDER_SITE_OTHER): Payer: Self-pay | Admitting: Ophthalmology

## 2022-06-05 ENCOUNTER — Encounter (INDEPENDENT_AMBULATORY_CARE_PROVIDER_SITE_OTHER): Payer: Medicare Other | Admitting: Ophthalmology

## 2022-06-05 DIAGNOSIS — H35372 Puckering of macula, left eye: Secondary | ICD-10-CM

## 2022-06-05 DIAGNOSIS — Z961 Presence of intraocular lens: Secondary | ICD-10-CM

## 2022-06-05 DIAGNOSIS — E113393 Type 2 diabetes mellitus with moderate nonproliferative diabetic retinopathy without macular edema, bilateral: Secondary | ICD-10-CM | POA: Diagnosis not present

## 2022-06-05 DIAGNOSIS — H43813 Vitreous degeneration, bilateral: Secondary | ICD-10-CM | POA: Diagnosis not present

## 2022-06-05 NOTE — Assessment & Plan Note (Addendum)
STable lenses OU great acuity.

## 2022-06-05 NOTE — Assessment & Plan Note (Addendum)
New Weiss ring, probably completion of recent PVD no holes or tears

## 2022-06-05 NOTE — Assessment & Plan Note (Signed)
No progression

## 2022-06-05 NOTE — Assessment & Plan Note (Signed)
Minor no change

## 2022-06-05 NOTE — Progress Notes (Signed)
06/05/2022     CHIEF COMPLAINT Patient presents for  Chief Complaint  Patient presents with   Eye Problem      HISTORY OF PRESENT ILLNESS: Jeanne [name] is a 86 y.o. female who presents to the clinic today for:   HPI   WIP- FLOATERS AND GRAY RING OS "With my left eye, sometimes theres pain. But I close my eyes at night and theres all these blues lights flashing. I noticed it about 2 weeks ago. It could be going on during the daytime as well but I only notice it in the dark. I'm not sure how long ive seen the floaters but I'm aware of it now. It doesn't impede my vision. " Last edited by Silvestre Moment on 06/05/2022  9:14 AM.      Referring physician: Seward Carol, MD [address] [place],  [place] [address]  HISTORICAL INFORMATION:   Selected notes from the MEDICAL RECORD NUMBER    Lab Results  Component Value Date   HGBA1C 6.6 (H) 10/09/2015     CURRENT MEDICATIONS: Current Outpatient Medications (Ophthalmic Drugs)  Medication Sig   latanoprost (XALATAN) 0.005 % ophthalmic solution INSTILL 1 DROP IN LEFT EYE NIGHTLY   Polyvinyl Alcohol-Povidone (REFRESH OP) Place 1 drop into both eyes at bedtime.   No current facility-administered medications for this visit. (Ophthalmic Drugs)   Current Outpatient Medications (Other)  Medication Sig   allopurinol (ZYLOPRIM) 100 MG tablet Take 100 mg by mouth daily.   aspirin EC 81 MG tablet Take 1 tablet (81 mg total) by mouth daily. Swallow whole.   Cholecalciferol (VITAMIN D3) 50 MCG (2000 UT) TABS Take 2,000 Units by mouth daily.    dapagliflozin propanediol (FARXIGA) 10 MG TABS tablet 1 tablet   diltiazem (TIAZAC) 120 MG 24 hr capsule Take 240 mg by mouth daily.   donepezil (ARICEPT) 10 MG tablet Take 1 tablet (10 mg total) by mouth at bedtime.   esomeprazole (NEXIUM) 20 MG capsule TAKE 1 CAPSULE (20 MG TOTAL) BY MOUTH DAILY AT 12 NOON.   ezetimibe (ZETIA) 10 MG tablet Take 10 mg by mouth daily.   ferrous sulfate  325 (65 FE) MG tablet Take 325 mg by mouth daily with breakfast.    furosemide (LASIX) 20 MG tablet TAKE 1 TABLET BY MOUTH EVERY DAY   hydrALAZINE (APRESOLINE) 25 MG tablet TAKE 1 TABLET BY MOUTH THREE TIMES A DAY   insulin glargine (LANTUS) 100 UNIT/ML injection Inject 65 Units into the skin daily before breakfast.   isosorbide mononitrate (IMDUR) 30 MG 24 hr tablet Take 1 tablet (30 mg total) by mouth daily.   losartan (COZAAR) 25 MG tablet TAKE 2 TABLETS BY MOUTH DAILY   meclizine (ANTIVERT) 25 MG tablet Take 1 tablet (25 mg total) by mouth 3 (three) times daily as needed for dizziness.   nitroGLYCERIN (NITROSTAT) 0.4 MG SL tablet Place 1 tablet (0.4 mg total) under the tongue as needed for chest pain.   oxybutynin (DITROPAN-XL) 10 MG 24 hr tablet Take 10 mg by mouth daily.   No current facility-administered medications for this visit. (Other)      REVIEW OF SYSTEMS: ROS   Negative for: Constitutional, Gastrointestinal, Neurological, Skin, Genitourinary, Musculoskeletal, HENT, Endocrine, Cardiovascular, Eyes, Respiratory, Psychiatric, Allergic/Imm, Heme/Lymph Last edited by Silvestre Moment on 06/05/2022  8:57 AM.       ALLERGIES Allergies  Allergen Reactions   Amoxicillin Nausea Only    Has patient had a PCN reaction causing immediate  rash, facial/tongue/throat swelling, SOB or lightheadedness with hypotension: "yes, i was dizzy" Has patient had a PCN reaction causing severe rash involving mucus membranes or skin necrosis: no Did a PCN reaction that required hospitalization : no, called the DR. Did PCN reaction occurring within the last 10 years: unk if all of the above answers are "NO", then may proceed with Cephalosporin use.    Enalapril Maleate     Other reaction(s): angioedema   Invokana [Canagliflozin] Nausea Only and Other (See Comments)    Dizziness    Liraglutide     Other reaction(s): Nausea and dizziness   Pioglitazone     Other reaction(s): macular edema   Tramadol Hcl      Other reaction(s): nausea, headache and dizziness    PAST MEDICAL HISTORY Past Medical History:  Diagnosis Date   Anemia    Arthritis    Carotid arterial disease ([place])    COVID-19    Diabetes mellitus    Hyperlipidemia    Hypertension    Migraine    Obesity    Reflux    Sleep apnea    Past Surgical History:  Procedure Laterality Date   CARDIAC CATHETERIZATION N/A 10/12/2015   Procedure: Left Heart Cath and Coronary Angiography;  Surgeon: Belva Crome, MD;  Location: [facility];  Service: Cardiovascular;  Laterality: N/A;   CHOLECYSTECTOMY  50 yrs ago   JOINT REPLACEMENT Right 2005   knee   WISDOM TOOTH EXTRACTION      FAMILY HISTORY Family History  Problem Relation Age of Onset   Heart attack Father    Heart attack Mother     SOCIAL HISTORY Social History   Tobacco Use   Smoking status: Never   Smokeless tobacco: Never  Vaping Use   Vaping Use: Never used  Substance Use Topics   Alcohol use: Never   Drug use: No         OPHTHALMIC EXAM:  Base Eye Exam     Visual Acuity (ETDRS)       Right Left   Dist cc 20/20 -1 20/20 -2    Correction: Glasses         Tonometry (Tonopen, 9:04 AM)       Right Left   Pressure 13 17         Pupils       Pupils APD   Right PERRL None   Left PERRL None         Visual Fields       Left Right    Full Full         Extraocular Movement       Right Left    Full Full         Neuro/Psych     Oriented x3: Yes   Mood/Affect: Normal         Dilation     Both eyes: 1.0% Mydriacyl, 2.5% Phenylephrine @ 9:04 AM           Slit Lamp and Fundus Exam     External Exam       Right Left   External Normal Normal         Slit Lamp Exam       Right Left   Lids/Lashes Normal Normal   Conjunctiva/Sclera White and quiet White and quiet   Cornea Clear Clear   Anterior Chamber Deep and quiet Deep and quiet   Iris Round and reactive Round and reactive   Lens  Posterior  chamber intraocular lens Posterior chamber intraocular lens   Anterior Vitreous Normal Normal         Fundus Exam       Right Left   Posterior Vitreous Posterior vitreous detachment Posterior vitreous detachment   Disc Normal Normal   C/D Ratio 0.35 0.3   Macula Normal Normal   Vessels NPDR- Moderate NPDR- Moderate   Periphery Normal, no holes or tears Normal careful view with 20 and 25 D examination no holes or tears, vitreous floater inferior            IMAGING AND PROCEDURES  Imaging and Procedures for 06/05/22  Color Fundus Photography Optos - OU - Both Eyes       Right Eye Progression has been stable. Disc findings include normal observations.   Left Eye Progression has been stable. Disc findings include normal observations.   Notes OD, stable findings with moderate nonproliferative diabetic retinopathy.  Good focal laser photocoagulation temporally OD  OS similar with moderate nonproliferative diabetic retinopathy, incidental PVD OS             ASSESSMENT/PLAN:  Posterior vitreous detachment of both eyes New Weiss ring, probably completion of recent PVD no holes or tears  Left epiretinal membrane Minor no change  Moderate nonproliferative diabetic retinopathy of both eyes (HCC) No progression  Pseudophakia, both eyes STable lenses OU great acuity.     ICD-10-CM   1. Moderate nonproliferative diabetic retinopathy of both eyes without macular edema associated with type 2 diabetes mellitus (HCC)  N17.0017 Color Fundus Photography Optos - OU - Both Eyes    2. Posterior vitreous detachment of both eyes  H43.813     3. Left epiretinal membrane  H35.372     4. Pseudophakia, both eyes  Z96.1       1.  OS with symptomatic completion of PVD with Weiss rings centrally and slightly superior.  Vitreous floater inferiorly, no pigment no heme no signs of progression of DR in either eye  2.  OU stable over time  3.  Ophthalmic Meds Ordered this  visit:  No orders of the defined types were placed in this encounter.      Return in about 6 months (around 12/05/2022) for DILATE OU, COLOR FP, OCT.  There are no Patient Instructions on file for this visit.   Explained the diagnoses, plan, and follow up with the patient and they expressed understanding.  Patient expressed understanding of the importance of proper follow up care.   Clent Demark Cleland Simkins M.D. Diseases & Surgery of the Retina and Vitreous Retina & Diabetic Urania 06/05/22     Abbreviations: M myopia (nearsighted); A astigmatism; H hyperopia (farsighted); P presbyopia; Mrx spectacle prescription;  CTL contact lenses; OD right eye; OS left eye; OU both eyes  XT exotropia; ET esotropia; PEK punctate epithelial keratitis; PEE punctate epithelial erosions; DES dry eye syndrome; MGD meibomian gland dysfunction; ATs artificial tears; PFAT's preservative free artificial tears; [place] nuclear sclerotic cataract; PSC posterior subcapsular cataract; ERM epi-retinal membrane; PVD posterior vitreous detachment; RD retinal detachment; DM diabetes mellitus; DR diabetic retinopathy; NPDR non-proliferative diabetic retinopathy; PDR proliferative diabetic retinopathy; CSME clinically significant macular edema; DME diabetic macular edema; dbh dot blot hemorrhages; CWS cotton wool spot; POAG primary open angle glaucoma; C/D cup-to-disc ratio; HVF humphrey visual field; GVF goldmann visual field; OCT optical coherence tomography; IOP intraocular pressure; BRVO Branch retinal vein occlusion; CRVO central retinal vein occlusion; CRAO central retinal artery occlusion; BRAO branch retinal artery  occlusion; RT retinal tear; SB scleral buckle; PPV pars plana vitrectomy; VH Vitreous hemorrhage; PRP panretinal laser photocoagulation; IVK intravitreal kenalog; VMT vitreomacular traction; MH Macular hole;  NVD neovascularization of the disc; NVE neovascularization elsewhere; AREDS age related eye disease study; ARMD  age related macular degeneration; POAG primary open angle glaucoma; EBMD epithelial/anterior basement membrane dystrophy; ACIOL anterior chamber intraocular lens; IOL intraocular lens; PCIOL posterior chamber intraocular lens; Phaco/IOL phacoemulsification with intraocular lens placement; [place] photorefractive keratectomy; LASIK laser assisted in situ keratomileusis; HTN hypertension; DM diabetes mellitus; COPD chronic obstructive pulmonary disease

## 2022-06-15 ENCOUNTER — Ambulatory Visit: Payer: Medicare Other | Admitting: Neurology

## 2022-06-21 ENCOUNTER — Encounter: Payer: Self-pay | Admitting: Neurology

## 2022-06-21 ENCOUNTER — Ambulatory Visit: Payer: Medicare Other | Admitting: Neurology

## 2022-06-21 VITALS — BP 152/86 | HR 76 | Ht 62.0 in | Wt 311.0 lb

## 2022-06-21 DIAGNOSIS — R42 Dizziness and giddiness: Secondary | ICD-10-CM | POA: Diagnosis not present

## 2022-06-21 DIAGNOSIS — F09 Unspecified mental disorder due to known physiological condition: Secondary | ICD-10-CM | POA: Diagnosis not present

## 2022-06-21 NOTE — Progress Notes (Signed)
[facility]  PATIENT: Jeanne [name] DOB: 05/03/1936  REQUESTING CLINICIAN: Seward Carol, MD HISTORY FROM: Patient and daughter REASON FOR VISIT: Dizziness/Cognitive impairment    HISTORICAL  CHIEF COMPLAINT:  Chief Complaint  Patient presents with   Follow-up    Rm 49 with daughter. Reports memory is the same no changes noted.     INTERVAL HISTORY 06/21/22: Patient presents today for follow-up, she is accompanied by her daughter.  Last visit was in August 10.  At that time I did start her for on Aricept for mild cognitive impairment.  She could not tolerate the medication due to vivid dreams therefore it was discontinued.  She still complains of dizziness that she describes as wooziness with position change.  There is also increased anxiety reported by patient and family.  She does not want to be alone.  Other than that there has not been any worsening of her mental status or cognition.  Overall she is stable.   HISTORY OF PRESENT ILLNESS:  This is a 86 year old woman past medical history of obesity, hypertension, hyperlipidemia, diabetes, glaucoma, CAD and CKD who is presenting for dizziness and daughter also reports cognitive impairment.  In terms of her dizziness, patient reports on July 20 while taking a shower she bent down to pick up the soap and when she got up she felt extreme dizziness that she have to sit down.  Her symptoms did not improve therefore she presented to the ED.  In the ED she had a head CT which was negative for any acute stroke and patient was discharged home with dizziness and Meclizine.  She reports dizziness at that time was positional, worse with head movement.  She has been on meclizine 3 times daily since July 20, reports improvement of her symptoms but stated that she will feel woozy when moving her head.  Denies any fall, denies any hearing loss denies any worsening tinnitus.    Her daughter who is present on the visit has  concerns about her memory.  She reports that patient is forgetful, she forgets about recent conversation, but she cannot provide good recall of her childhood memory and she also noted that she is repetitive.  Patient is not driving because she got lost while driving and since then she has decided not to drive.  Patient currently she is living with both of her daughters, she is not cooking or cleaning but states that she will walk to one room and forget the reason why she was in the room in the first place.  Daughters are handling her bills but she is able to take care of herself, she is able to bathe herself, dress herself without any help.  She does walk with the cane.   She does complaints of changes in her vision but has an appointment with ophthalmologist in a couple months    OTHER MEDICAL CONDITIONS: Diabetes, Hypertension, Hypertension, Glaucoma, CKD, CAD    REVIEW OF SYSTEMS: Full 14 system review of systems performed and negative with exception of: as noted in the HPI   ALLERGIES: Allergies  Allergen Reactions   Amoxicillin Nausea Only    Has patient had a PCN reaction causing immediate rash, facial/tongue/throat swelling, SOB or lightheadedness with hypotension: "yes, i was dizzy" Has patient had a PCN reaction causing severe rash involving mucus membranes or skin necrosis: no Did a PCN reaction that required hospitalization : no, called the DR. Did PCN reaction occurring within the last 10 years: unk if  all of the above answers are "NO", then may proceed with Cephalosporin use.    Enalapril Maleate     Other reaction(s): angioedema   Invokana [Canagliflozin] Nausea Only and Other (See Comments)    Dizziness    Liraglutide     Other reaction(s): Nausea and dizziness   Pioglitazone     Other reaction(s): macular edema   Tramadol Hcl     Other reaction(s): nausea, headache and dizziness    HOME MEDICATIONS: Outpatient Medications Prior to Visit  Medication Sig Dispense  Refill   allopurinol (ZYLOPRIM) 100 MG tablet Take 100 mg by mouth daily.     aspirin EC 81 MG tablet Take 1 tablet (81 mg total) by mouth daily. Swallow whole. 90 tablet 3   Cholecalciferol (VITAMIN D3) 50 MCG (2000 UT) TABS Take 2,000 Units by mouth daily.      dapagliflozin propanediol (FARXIGA) 10 MG TABS tablet 1 tablet     diltiazem (TIAZAC) 120 MG 24 hr capsule Take 240 mg by mouth daily.     donepezil (ARICEPT) 10 MG tablet Take 1 tablet (10 mg total) by mouth at bedtime. 30 tablet 3   esomeprazole (NEXIUM) 20 MG capsule TAKE 1 CAPSULE (20 MG TOTAL) BY MOUTH DAILY AT 12 NOON. 90 capsule 1   ezetimibe (ZETIA) 10 MG tablet Take 10 mg by mouth daily.     ferrous sulfate 325 (65 FE) MG tablet Take 325 mg by mouth daily with breakfast.      furosemide (LASIX) 20 MG tablet TAKE 1 TABLET BY MOUTH EVERY DAY 90 tablet 1   hydrALAZINE (APRESOLINE) 25 MG tablet TAKE 1 TABLET BY MOUTH THREE TIMES A DAY 270 tablet 3   insulin glargine (LANTUS) 100 UNIT/ML injection Inject 65 Units into the skin daily before breakfast.     latanoprost (XALATAN) 0.005 % ophthalmic solution INSTILL 1 DROP IN LEFT EYE NIGHTLY 7.5 mL 12   losartan (COZAAR) 25 MG tablet TAKE 2 TABLETS BY MOUTH DAILY 180 tablet 2   meclizine (ANTIVERT) 25 MG tablet Take 1 tablet (25 mg total) by mouth 3 (three) times daily as needed for dizziness. 30 tablet 0   nitroGLYCERIN (NITROSTAT) 0.4 MG SL tablet Place 1 tablet (0.4 mg total) under the tongue as needed for chest pain. 25 tablet 3   oxybutynin (DITROPAN-XL) 10 MG 24 hr tablet Take 10 mg by mouth daily.     Polyvinyl Alcohol-Povidone (REFRESH OP) Place 1 drop into both eyes at bedtime.     isosorbide mononitrate (IMDUR) 30 MG 24 hr tablet Take 1 tablet (30 mg total) by mouth daily. 90 tablet 3   No facility-administered medications prior to visit.    PAST MEDICAL HISTORY: Past Medical History:  Diagnosis Date   Anemia    Arthritis    Carotid arterial disease ([place])    COVID-19     Diabetes mellitus    Hyperlipidemia    Hypertension    Migraine    Obesity    Reflux    Sleep apnea     PAST SURGICAL HISTORY: Past Surgical History:  Procedure Laterality Date   CARDIAC CATHETERIZATION N/A 10/12/2015   Procedure: Left Heart Cath and Coronary Angiography;  Surgeon: Belva Crome, MD;  Location: [facility];  Service: Cardiovascular;  Laterality: N/A;   CHOLECYSTECTOMY  50 yrs ago   JOINT REPLACEMENT Right 2005   knee   WISDOM TOOTH EXTRACTION      FAMILY HISTORY: Family History  Problem Relation Age  of Onset   Heart attack Father    Heart attack Mother     SOCIAL HISTORY: Social History   Socioeconomic History   Marital status: Widowed    Spouse name: Not on file   Number of children: 2   Years of education: Not on file   Highest education level: Not on file  Occupational History   Not on file  Tobacco Use   Smoking status: Never   Smokeless tobacco: Never  Vaping Use   Vaping Use: Never used  Substance and Sexual Activity   Alcohol use: Never   Drug use: No   Sexual activity: Not on file  Other Topics Concern   Not on file  Social History Narrative   Not on file   Social Determinants of Health   Financial Resource Strain: Not on file  Food Insecurity: Not on file  Transportation Needs: Not on file  Physical Activity: Not on file  Stress: Not on file  Social Connections: Not on file  Intimate Partner Violence: Not on file    PHYSICAL EXAM  GENERAL EXAM/CONSTITUTIONAL: Vitals:  Vitals:   06/21/22 1328  BP: (!) 152/86  Pulse: 76  Weight: (!) 311 lb (141.1 kg)  Height: '5\' 2"'$  (1.575 m)    Body mass index is 56.88 kg/m. Wt Readings from Last 3 Encounters:  06/21/22 (!) 311 lb (141.1 kg)  04/13/22 (!) 320 lb 8 oz (145.4 kg)  11/14/21 (!) 318 lb (144.2 kg)   Patient is in no distress; well developed, nourished and groomed; neck is supple.   EYES: Pupils round and reactive to light, Visual fields full to  confrontation, Extraocular movements intacts,   MUSCULOSKELETAL: Gait, strength, tone, movements noted in Neurologic exam below  NEUROLOGIC: MENTAL STATUS:      No data to display            04/13/2022   11:21 AM  Montreal Cognitive Assessment   Visuospatial/ Executive (0/5) 3  Naming (0/3) 2  Attention: Read list of digits (0/2) 1  Attention: Read list of letters (0/1) 1  Attention: Serial 7 subtraction starting at 100 (0/3) 3  Language: Repeat phrase (0/2) 2  Language : Fluency (0/1) 1  Abstraction (0/2) 2  Delayed Recall (0/5) 0  Orientation (0/6) 6  Total 21  Adjusted Score (based on education) 21    CRANIAL NERVE:  2nd, 3rd, 4th, 6th - pupils equal and reactive to light, visual fields full to confrontation, extraocular muscles intact, no nystagmus 5th - facial sensation symmetric 7th - facial strength symmetric 8th - hearing intact 9th - palate elevates symmetrically, uvula midline 11th - shoulder shrug symmetric 12th - tongue protrusion midline  MOTOR:  normal bulk and tone, full strength in the BUE, BLE  SENSORY:  normal and symmetric to light touch  COORDINATION:  finger-nose-finger, fine finger movements normal  GAIT/STATION:  Ambulate with a cane    DIAGNOSTIC DATA (LABS, IMAGING, TESTING) - I reviewed patient records, labs, notes, testing and imaging myself where available.  Lab Results  Component Value Date   WBC 8.4 05/11/2022   HGB 11.4 (L) 05/11/2022   HCT 37.0 05/11/2022   MCV 86.7 05/11/2022   PLT 286 05/11/2022      Component Value Date/Time   NA 137 05/11/2022 1755   K 4.0 05/11/2022 1755   CL 100 05/11/2022 1755   CO2 27 05/11/2022 1755   GLUCOSE 109 (H) 05/11/2022 1755   BUN 24 (H) 05/11/2022 1755   CREATININE  1.50 (H) 05/11/2022 1755   CREATININE 1.63 (H) 01/27/2020 1044   CALCIUM 9.0 05/11/2022 1755   PROT 6.3 (L) 06/09/2020 1221   ALBUMIN 3.2 (L) 06/09/2020 1221   AST 17 06/09/2020 1221   AST 12 (L) 01/27/2020 1044    ALT 10 06/09/2020 1221   ALT 9 01/27/2020 1044   ALKPHOS 58 06/09/2020 1221   BILITOT 0.6 06/09/2020 1221   BILITOT 0.3 01/27/2020 1044   GFRNONAA 34 (L) 05/11/2022 1755   GFRNONAA 29 (L) 01/27/2020 1044   GFRAA 34 (L) 01/27/2020 1044   Lab Results  Component Value Date   CHOL 92 03/31/2019   HDL 32 (L) 03/31/2019   LDLCALC 45 03/31/2019   TRIG 53 10/11/2019   CHOLHDL 2.9 03/31/2019   Lab Results  Component Value Date   HGBA1C 6.6 (H) 10/09/2015   Lab Results  Component Value Date   VITAMINB12 590 04/13/2022   Lab Results  Component Value Date   TSH 2.340 04/13/2022    Head CT 03/23/22 No acute intracranial findings are seen in noncontrast CT brain. Atrophy. Small-vessel disease    ASSESSMENT AND PLAN  86 y.o. year old female with history of obesity, hypertension, hyperlipidemia, diabetes, glaucoma, CAD and CKD who is presenting for mild cognitive impairment follow up. She could not tolerate the Aricept therefore she self discontinued it.  Overall she is stable per family.  She still complains of dizziness with positional change.  I agree with discontinuing the Aricept due to side effects.  I strongly advised her to increase her fluid intake, and increase exercise. She voices understanding and reports joining the [facility]. Follow up in a year or sooner if worse.     1. Mild cognitive disorder   2. Dizziness      Patient Instructions  Discontinue Aricept  Continue your other medications  Follow up in a year    There are well-accepted and sensible ways to reduce risk for Alzheimers disease and other degenerative brain disorders .  Exercise Daily Walk A daily 20 minute walk should be part of your routine. Disease related apathy can be a significant roadblock to exercise and the only way to overcome this is to make it a daily routine and perhaps have a reward at the end (something your loved one loves to eat or drink perhaps) or a personal trainer coming to the home  can also be very useful. Most importantly, the patient is much more likely to exercise if the caregiver / spouse does it with him/her. In general a structured, repetitive schedule is best.  General Health: Any diseases which effect your body will effect your brain such as a pneumonia, urinary infection, blood clot, heart attack or stroke. Keep contact with your primary care doctor for regular follow ups.  Sleep. A good nights sleep is healthy for the brain. Seven hours is recommended. If you have insomnia or poor sleep habits we can give you some instructions. If you have sleep apnea wear your mask.  Diet: Eating a heart healthy diet is also a good idea; fish and poultry instead of red meat, nuts (mostly non-peanuts), vegetables, fruits, olive oil or canola oil (instead of butter), minimal salt (use other spices to flavor foods), whole grain rice, bread, cereal and pasta and wine in moderation.Research is now showing that the MIND diet, which is a combination of The Mediterranean diet and the DASH diet, is beneficial for cognitive processing and longevity. Information about this diet can be found in The  MIND Diet, a book by Doyne Keel, MS, RDN, and online at [employer].si  Finances, Power of Attorney and Advance Directives: You should consider putting legal safeguards in place with regard to financial and medical decision making. While the spouse always has power of attorney for medical and financial issues in the absence of any form, you should consider what you want in case the spouse / caregiver is no longer around or capable of making decisions.   No orders of the defined types were placed in this encounter.   No orders of the defined types were placed in this encounter.   Return in about 1 year (around 06/22/2023).      Alric Ran, MD 06/21/2022, 4:20 PM  [facility] [address], [place], [place] [address] [phone]

## 2022-06-21 NOTE — Patient Instructions (Addendum)
Discontinue Aricept  Continue your other medications  Follow up in a year    There are well-accepted and sensible ways to reduce risk for Alzheimers disease and other degenerative brain disorders .  Exercise Daily Walk A daily 20 minute walk should be part of your routine. Disease related apathy can be a significant roadblock to exercise and the only way to overcome this is to make it a daily routine and perhaps have a reward at the end (something your loved one loves to eat or drink perhaps) or a personal trainer coming to the home can also be very useful. Most importantly, the patient is much more likely to exercise if the caregiver / spouse does it with him/her. In general a structured, repetitive schedule is best.  General Health: Any diseases which effect your body will effect your brain such as a pneumonia, urinary infection, blood clot, heart attack or stroke. Keep contact with your primary care doctor for regular follow ups.  Sleep. A good nights sleep is healthy for the brain. Seven hours is recommended. If you have insomnia or poor sleep habits we can give you some instructions. If you have sleep apnea wear your mask.  Diet: Eating a heart healthy diet is also a good idea; fish and poultry instead of red meat, nuts (mostly non-peanuts), vegetables, fruits, olive oil or canola oil (instead of butter), minimal salt (use other spices to flavor foods), whole grain rice, bread, cereal and pasta and wine in moderation.Research is now showing that the MIND diet, which is a combination of The Mediterranean diet and the DASH diet, is beneficial for cognitive processing and longevity. Information about this diet can be found in The MIND Diet, a book by Doyne Keel, MS, RDN, and online at [employer].si  Finances, Power of Attorney and Advance Directives: You should consider putting legal safeguards in place with regard to financial and medical decision making. While the  spouse always has power of attorney for medical and financial issues in the absence of any form, you should consider what you want in case the spouse / caregiver is no longer around or capable of making decisions.

## 2022-07-03 ENCOUNTER — Other Ambulatory Visit: Payer: Self-pay | Admitting: Neurology

## 2022-07-17 ENCOUNTER — Telehealth: Payer: Self-pay | Admitting: Neurology

## 2022-07-17 NOTE — Telephone Encounter (Signed)
Will monitor for open appt. None at present.

## 2022-07-17 NOTE — Telephone Encounter (Signed)
Pt's daughter, Alixis Harmon. Pt having dizziness, wooziness. Having a slight pain in eyes. Have called PCP, instructed to contact Dr. April Manson. Have also been to eye doctor, changed prescription in glass. Nothing has worked. Would like a call from the nurse.

## 2022-07-17 NOTE — Telephone Encounter (Signed)
I called pt to schedule appt and she states that she would like to be fit in sooner that what is available and did not want to schedule for mid Dec. Pt was offered to be put on NP schedule and she states that "No offense, but if I have to pay to be seen I would like to see an MD." Please advise.

## 2022-07-18 NOTE — Telephone Encounter (Signed)
Noted thank you

## 2022-07-18 NOTE — Telephone Encounter (Signed)
Ok thanks 

## 2022-07-18 NOTE — Telephone Encounter (Addendum)
Pt's daughter, Jeanne [name] (on [place]) asking to please call her if have appt available. Pt having dizziness, wooziness. Having a slight pain in eyes. Have called PCP, instructed to contact Dr. April Manson. Have also been to eye doctor, changed prescription in glass. Nothing has worked.   Rescheduled pt on 07/19/22 at 10:15 am with Dr April Manson

## 2022-07-19 ENCOUNTER — Ambulatory Visit: Payer: Medicare Other | Admitting: Neurology

## 2022-07-19 ENCOUNTER — Telehealth: Payer: Self-pay | Admitting: Neurology

## 2022-07-19 ENCOUNTER — Encounter: Payer: Self-pay | Admitting: Neurology

## 2022-07-19 VITALS — BP 126/84 | HR 106

## 2022-07-19 DIAGNOSIS — F09 Unspecified mental disorder due to known physiological condition: Secondary | ICD-10-CM | POA: Diagnosis not present

## 2022-07-19 DIAGNOSIS — R42 Dizziness and giddiness: Secondary | ICD-10-CM

## 2022-07-19 DIAGNOSIS — I951 Orthostatic hypotension: Secondary | ICD-10-CM

## 2022-07-19 MED ORDER — MECLIZINE HCL 25 MG PO TABS: 25.0000 mg | ORAL_TABLET | Freq: Three times a day (TID) | ORAL | 0 refills | Status: DC | PRN

## 2022-07-19 NOTE — Progress Notes (Unsigned)
[facility]  PATIENT: Jeanne [name] DOB: 08-03-1936  REQUESTING CLINICIAN: Seward Carol, MD HISTORY FROM: Patient and daughter REASON FOR VISIT: Dizziness/Cognitive impairment    HISTORICAL  CHIEF COMPLAINT:  Chief Complaint  Patient presents with   Follow-up    Rm 23 with daughter.  Here to f/u on worsening dizziness, pt denies any falls but feels off balance. Since last visit legs tingling and headaches have worsened.    INTERVAL HISTORY 07/19/2022 Patient presents today for follow-up, she is accompanied by her daughter.  Since last visit a month ago, she has been complaining of dizziness.  Patient reports the dizziness is constant but varies in intensity.  Dizziness is worse with head movement and also worse upon standing and walking.  She denies any falls, denies any headaches.  She describes dizziness as woozy feeling, lightheadedness.  Whenever she had these events, she has to sit and wait for the episode to pass.  She reports that meclizine has been helping if she takes it.   INTERVAL HISTORY 06/21/22: Patient presents today for follow-up, she is accompanied by her daughter.  Last visit was in August 10.  At that time I did start her for on Aricept for mild cognitive impairment.  She could not tolerate the medication due to vivid dreams therefore it was discontinued.  She still complains of dizziness that she describes as wooziness with position change.  There is also increased anxiety reported by patient and family.  She does not want to be alone.  Other than that there has not been any worsening of her mental status or cognition.  Overall she is stable.   HISTORY OF PRESENT ILLNESS:  This is a 86 year old woman past medical history of obesity, hypertension, hyperlipidemia, diabetes, glaucoma, CAD and CKD who is presenting for dizziness and daughter also reports cognitive impairment.  In terms of her dizziness, patient reports on July 20 while taking a  shower she bent down to pick up the soap and when she got up she felt extreme dizziness that she have to sit down.  Her symptoms did not improve therefore she presented to the ED.  In the ED she had a head CT which was negative for any acute stroke and patient was discharged home with dizziness and Meclizine.  She reports dizziness at that time was positional, worse with head movement.  She has been on meclizine 3 times daily since July 20, reports improvement of her symptoms but stated that she will feel woozy when moving her head.  Denies any fall, denies any hearing loss denies any worsening tinnitus.    Her daughter who is present on the visit has concerns about her memory.  She reports that patient is forgetful, she forgets about recent conversation, but she cannot provide good recall of her childhood memory and she also noted that she is repetitive.  Patient is not driving because she got lost while driving and since then she has decided not to drive.  Patient currently she is living with both of her daughters, she is not cooking or cleaning but states that she will walk to one room and forget the reason why she was in the room in the first place.  Daughters are handling her bills but she is able to take care of herself, she is able to bathe herself, dress herself without any help.  She does walk with the cane.   She does complaints of changes in her vision but has an appointment with  ophthalmologist in a couple months    OTHER MEDICAL CONDITIONS: Diabetes, Hypertension, Hypertension, Glaucoma, CKD, CAD    REVIEW OF SYSTEMS: Full 14 system review of systems performed and negative with exception of: as noted in the HPI   ALLERGIES: Allergies  Allergen Reactions   Amoxicillin Nausea Only    Has patient had a PCN reaction causing immediate rash, facial/tongue/throat swelling, SOB or lightheadedness with hypotension: "yes, i was dizzy" Has patient had a PCN reaction causing severe rash involving  mucus membranes or skin necrosis: no Did a PCN reaction that required hospitalization : no, called the DR. Did PCN reaction occurring within the last 10 years: unk if all of the above answers are "NO", then may proceed with Cephalosporin use.    Enalapril Maleate     Other reaction(s): angioedema   Invokana [Canagliflozin] Nausea Only and Other (See Comments)    Dizziness    Liraglutide     Other reaction(s): Nausea and dizziness   Pioglitazone     Other reaction(s): macular edema   Tramadol Hcl     Other reaction(s): nausea, headache and dizziness    HOME MEDICATIONS: Outpatient Medications Prior to Visit  Medication Sig Dispense Refill   allopurinol (ZYLOPRIM) 100 MG tablet Take 100 mg by mouth daily.     aspirin EC 81 MG tablet Take 1 tablet (81 mg total) by mouth daily. Swallow whole. 90 tablet 3   Cholecalciferol (VITAMIN D3) 50 MCG (2000 UT) TABS Take 2,000 Units by mouth daily.      dapagliflozin propanediol (FARXIGA) 10 MG TABS tablet 1 tablet     diltiazem (TIAZAC) 120 MG 24 hr capsule Take 240 mg by mouth daily.     esomeprazole (NEXIUM) 20 MG capsule TAKE 1 CAPSULE (20 MG TOTAL) BY MOUTH DAILY AT 12 NOON. 90 capsule 1   ezetimibe (ZETIA) 10 MG tablet Take 10 mg by mouth daily.     ferrous sulfate 325 (65 FE) MG tablet Take 325 mg by mouth daily with breakfast.      furosemide (LASIX) 20 MG tablet TAKE 1 TABLET BY MOUTH EVERY DAY 90 tablet 1   hydrALAZINE (APRESOLINE) 25 MG tablet TAKE 1 TABLET BY MOUTH THREE TIMES A DAY 270 tablet 3   insulin glargine (LANTUS) 100 UNIT/ML injection Inject 65 Units into the skin daily before breakfast.     latanoprost (XALATAN) 0.005 % ophthalmic solution INSTILL 1 DROP IN LEFT EYE NIGHTLY 7.5 mL 12   losartan (COZAAR) 25 MG tablet TAKE 2 TABLETS BY MOUTH DAILY 180 tablet 2   nitroGLYCERIN (NITROSTAT) 0.4 MG SL tablet Place 1 tablet (0.4 mg total) under the tongue as needed for chest pain. 25 tablet 3   oxybutynin (DITROPAN-XL) 10 MG 24  hr tablet Take 10 mg by mouth daily.     Polyvinyl Alcohol-Povidone (REFRESH OP) Place 1 drop into both eyes at bedtime.     donepezil (ARICEPT) 10 MG tablet Take 1 tablet (10 mg total) by mouth at bedtime. 30 tablet 3   meclizine (ANTIVERT) 25 MG tablet Take 1 tablet (25 mg total) by mouth 3 (three) times daily as needed for dizziness. 30 tablet 0   isosorbide mononitrate (IMDUR) 30 MG 24 hr tablet Take 1 tablet (30 mg total) by mouth daily. 90 tablet 3   No facility-administered medications prior to visit.    PAST MEDICAL HISTORY: Past Medical History:  Diagnosis Date   Anemia    Arthritis    Carotid arterial disease ([place])  COVID-19    Diabetes mellitus    Hyperlipidemia    Hypertension    Migraine    Obesity    Reflux    Sleep apnea     PAST SURGICAL HISTORY: Past Surgical History:  Procedure Laterality Date   CARDIAC CATHETERIZATION N/A 10/12/2015   Procedure: Left Heart Cath and Coronary Angiography;  Surgeon: Belva Crome, MD;  Location: [facility];  Service: Cardiovascular;  Laterality: N/A;   CHOLECYSTECTOMY  50 yrs ago   JOINT REPLACEMENT Right 2005   knee   WISDOM TOOTH EXTRACTION      FAMILY HISTORY: Family History  Problem Relation Age of Onset   Heart attack Father    Heart attack Mother     SOCIAL HISTORY: Social History   Socioeconomic History   Marital status: Widowed    Spouse name: Not on file   Number of children: 2   Years of education: Not on file   Highest education level: Not on file  Occupational History   Not on file  Tobacco Use   Smoking status: Never   Smokeless tobacco: Never  Vaping Use   Vaping Use: Never used  Substance and Sexual Activity   Alcohol use: Never   Drug use: No   Sexual activity: Not on file  Other Topics Concern   Not on file  Social History Narrative   Not on file   Social Determinants of Health   Financial Resource Strain: Not on file  Food Insecurity: Not on file  Transportation Needs:  Not on file  Physical Activity: Not on file  Stress: Not on file  Social Connections: Not on file  Intimate Partner Violence: Not on file    PHYSICAL EXAM  GENERAL EXAM/CONSTITUTIONAL: Vitals:  Vitals:   07/19/22 1010 07/19/22 1056 07/19/22 1057  BP: 132/84 (!) 166/84 126/84  Pulse: 82 86 (!) 106  SpO2: 95%      There is no height or weight on file to calculate BMI. Wt Readings from Last 3 Encounters:  06/21/22 (!) 311 lb (141.1 kg)  04/13/22 (!) 320 lb 8 oz (145.4 kg)  11/14/21 (!) 318 lb (144.2 kg)   Patient is in no distress; well developed, nourished and groomed; neck is supple.   EYES: Pupils round and reactive to light, Visual fields full to confrontation, Extraocular movements intacts,   MUSCULOSKELETAL: Gait, strength, tone, movements noted in Neurologic exam below  NEUROLOGIC: MENTAL STATUS:      No data to display            04/13/2022   11:21 AM  Montreal Cognitive Assessment   Visuospatial/ Executive (0/5) 3  Naming (0/3) 2  Attention: Read list of digits (0/2) 1  Attention: Read list of letters (0/1) 1  Attention: Serial 7 subtraction starting at 100 (0/3) 3  Language: Repeat phrase (0/2) 2  Language : Fluency (0/1) 1  Abstraction (0/2) 2  Delayed Recall (0/5) 0  Orientation (0/6) 6  Total 21  Adjusted Score (based on education) 21    CRANIAL NERVE:  2nd, 3rd, 4th, 6th - visual fields full to confrontation, extraocular muscles intact, no nystagmus 5th - facial sensation symmetric 7th - facial strength symmetric 8th - hearing intact 9th - palate elevates symmetrically, uvula midline 11th - shoulder shrug symmetric 12th - tongue protrusion midline  MOTOR:  normal bulk and tone, full strength in the BUE, BLE  SENSORY:  normal and symmetric to light touch  COORDINATION:  finger-nose-finger, fine finger  movements normal  GAIT/STATION:  In a wheelchair    DIAGNOSTIC DATA (LABS, IMAGING, TESTING) - I reviewed patient records, labs,  notes, testing and imaging myself where available.  Lab Results  Component Value Date   WBC 8.4 05/11/2022   HGB 11.4 (L) 05/11/2022   HCT 37.0 05/11/2022   MCV 86.7 05/11/2022   PLT 286 05/11/2022      Component Value Date/Time   NA 137 05/11/2022 1755   K 4.0 05/11/2022 1755   CL 100 05/11/2022 1755   CO2 27 05/11/2022 1755   GLUCOSE 109 (H) 05/11/2022 1755   BUN 24 (H) 05/11/2022 1755   CREATININE 1.50 (H) 05/11/2022 1755   CREATININE 1.63 (H) 01/27/2020 1044   CALCIUM 9.0 05/11/2022 1755   PROT 6.3 (L) 06/09/2020 1221   ALBUMIN 3.2 (L) 06/09/2020 1221   AST 17 06/09/2020 1221   AST 12 (L) 01/27/2020 1044   ALT 10 06/09/2020 1221   ALT 9 01/27/2020 1044   ALKPHOS 58 06/09/2020 1221   BILITOT 0.6 06/09/2020 1221   BILITOT 0.3 01/27/2020 1044   GFRNONAA 34 (L) 05/11/2022 1755   GFRNONAA 29 (L) 01/27/2020 1044   GFRAA 34 (L) 01/27/2020 1044   Lab Results  Component Value Date   CHOL 92 03/31/2019   HDL 32 (L) 03/31/2019   LDLCALC 45 03/31/2019   TRIG 53 10/11/2019   CHOLHDL 2.9 03/31/2019   Lab Results  Component Value Date   HGBA1C 6.6 (H) 10/09/2015   Lab Results  Component Value Date   VITAMINB12 590 04/13/2022   Lab Results  Component Value Date   TSH 2.340 04/13/2022    Head CT 03/23/22 No acute intracranial findings are seen in noncontrast CT brain. Atrophy. Small-vessel disease    ASSESSMENT AND PLAN  86 y.o. year old female with history of obesity, hypertension, hyperlipidemia, diabetes, glaucoma, CAD and CKD and MCI who is presenting for follow up for her dizziness.  Since last visit her plan was to increase her physical activity, joined the [employer].  Patient report trying that and actually feeling better but due to dizziness she has to stop going to the gym. The dizziness is constant.  I will try her on make back on meclizine up to 2 times per day as tolerated.  I will also send her to vestibular therapy.  I will also obtain a MRI brain to rule out  intracranial pathology.  Her orthostatic vitals today was checked and it was positive for orthostatic hypotension (166/84 sitting and Pulse 86 and 126/84 pulse 106 standing)     1. Dizziness   2. Mild cognitive disorder   3. Orthostatic hypotension       Patient Instructions  MRI brain without contrast  Referral to vestibular therapy  Meclizine, 2 to 3 times per day as needed  Increase physical activity as tolerated  Follow up your regarding blood pressure management  Follow up with endocrinologist  Orders Placed This Encounter  Procedures   MR BRAIN WO CONTRAST   PT vestibular rehab    Meds ordered this encounter  Medications   meclizine (ANTIVERT) 25 MG tablet    Sig: Take 1 tablet (25 mg total) by mouth 3 (three) times daily as needed for dizziness.    Dispense:  30 tablet    Refill:  0    No follow-ups on file.   I have spent a total of 45 minutes dedicated to this patient today, preparing to see patient, performing a medically appropriate  examination and evaluation, ordering tests and/or medications and procedures, and counseling and educating the patient/family/caregiver; independently interpreting result and communicating results to the family/patient/caregiver; and documenting clinical information in the electronic medical record.    Alric Ran, MD 07/19/2022, 6:58 PM  [facility] [address] [place], [place] [address] [phone]   ADDENDUM  Spoke with daughter Katharine Look 07/20/22 at 71, discussed patient diagnosis of orthostatic hypotension and advice her to follow up with Dr. Delfina Redwood regarding further management of her blood pressure

## 2022-07-19 NOTE — Patient Instructions (Addendum)
MRI brain without contrast  Referral to vestibular therapy  Meclizine, 2 to 3 times per day as needed  Increase physical activity as tolerated  Follow up your regarding blood pressure management  Follow up with endocrinologist

## 2022-07-19 NOTE — Telephone Encounter (Signed)
[employer] medicare NPR sent to GI [phone] 

## 2022-07-20 ENCOUNTER — Encounter: Payer: Self-pay | Admitting: Neurology

## 2022-08-05 ENCOUNTER — Ambulatory Visit
Admission: RE | Admit: 2022-08-05 | Discharge: 2022-08-05 | Disposition: A | Discharge status: Home / Self Care | Payer: Medicare Other | Source: Ambulatory Visit | Attending: Neurology | Admitting: Neurology

## 2022-08-05 DIAGNOSIS — R42 Dizziness and giddiness: Secondary | ICD-10-CM

## 2022-08-05 DIAGNOSIS — R52 Pain, unspecified: Secondary | ICD-10-CM | POA: Diagnosis not present

## 2022-08-07 ENCOUNTER — Telehealth: Payer: Self-pay | Admitting: Neurology

## 2022-08-07 DIAGNOSIS — R42 Dizziness and giddiness: Secondary | ICD-10-CM

## 2022-08-07 NOTE — Telephone Encounter (Signed)
Order for vestibular therapy placed 07/19/2022, I do not see where this was sent out? This order would need to be sent to physical therapy office I assume the neuro rehab office next door could accommodate this request.  Please follow up with them.  Thanks

## 2022-08-07 NOTE — Telephone Encounter (Signed)
Pt's daughter, Whittney Steenson calling to check on status of vestibular referral for pt's vertigo. Would like a call back.

## 2022-08-08 NOTE — Telephone Encounter (Signed)
Spoke with [facility] Re[facility] next door. They can take the referral Vestibular for pt's vertigo. Can you enter a referral?

## 2022-08-08 NOTE — Telephone Encounter (Signed)
Referral for physical therapy sent through [facility] to [facility]. Phone: [phone]

## 2022-08-09 ENCOUNTER — Telehealth: Payer: Self-pay | Admitting: Neurology

## 2022-08-09 NOTE — Progress Notes (Signed)
Please call and advise the patient/family that the recent MRI Brain did not show any acute finding such as such as a stroke, or mass or blood products that can cause her dizziness. It showed however changes that are due to age, hypertension and heart disease such as brain atrophy and chronic small vessel disease.  No further action is required on this test at this time. Please remind patient to keep any upcoming appointments or tests and to call us with any interim questions, concerns, problems or updates. Thanks,   Alric Ran, MD

## 2022-08-10 NOTE — Telephone Encounter (Signed)
Results given to daughter , verified by name and DOB, Daughter was concerned about treatment for her mother's headaches.

## 2022-08-15 ENCOUNTER — Other Ambulatory Visit: Payer: Medicare Other

## 2022-08-15 NOTE — Telephone Encounter (Signed)
Spoke with daughter Katharine Look, patient is still experience lightheadedness (wooziness) upon standing and moving, sometimes associated with headaches. I have advised them to check her blood pressure when she is symptomatic. She can still use Tylenol for headaches or try Dramamine for the dizziness. Daughter will continue to update [place]. She is set to start  Vestibular therapy tomorrow.

## 2022-08-15 NOTE — Therapy (Signed)
OUTPATIENT PHYSICAL THERAPY VESTIBULAR EVALUATION     Patient Name: Jeanne [name] MRN: [id number] DOB:[birth year], 86 y.o., female Today's Date: 08/16/2022  END OF SESSION:  PT End of Session - 08/16/22 4098     Visit Number 1    Number of Visits 9    Date for PT Re-Evaluation 10/15/22   due to potential delay in scheduling   Authorization Type [facility] Medicare    PT Start Time 0803    PT Stop Time 0847    PT Time Calculation (min) 44 min    Activity Tolerance Patient tolerated treatment well    Behavior During Therapy [facility] for tasks assessed/performed             Past Medical History:  Diagnosis Date   Anemia    Arthritis    Carotid arterial disease ([place])    COVID-19    Diabetes mellitus    Hyperlipidemia    Hypertension    Migraine    Obesity    Reflux    Sleep apnea    Past Surgical History:  Procedure Laterality Date   CARDIAC CATHETERIZATION N/A 10/12/2015   Procedure: Left Heart Cath and Coronary Angiography;  Surgeon: Belva Crome, MD;  Location: [facility];  Service: Cardiovascular;  Laterality: N/A;   CHOLECYSTECTOMY  50 yrs ago   JOINT REPLACEMENT Right 2005   knee   WISDOM TOOTH EXTRACTION     Patient Active Problem List   Diagnosis Date Noted   Pseudophakia, both eyes 06/05/2022   Gastroesophageal reflux disease 05/02/2021   Chronic diastolic heart failure ([place]) 10/07/2020   Moderate nonproliferative diabetic retinopathy of both eyes ([place]) 05/25/2020   Chorioretinal scar 05/25/2020   Left epiretinal membrane 05/25/2020   Posterior vitreous detachment of both eyes 05/25/2020   Acute respiratory failure with hypoxia ([place]) 10/11/2019   CAP (community acquired pneumonia) 11/91/4782   Diastolic dysfunction, left ventricle 04/05/2019   Coronary artery disease of native artery of native heart with stable angina pectoris ([place]) 11/23/2018   Hypercholesteremia 11/23/2018   Unstable angina ([place]) 10/12/2015   Abnormal myocardial perfusion study  10/12/2015   NSVT (nonsustained ventricular tachycardia) ([place]) 10/11/2015   Primary hypertension 10/11/2015   CKD (chronic kidney disease), stage III ([place]) 10/11/2015   Atypical chest pain    Angina pectoris ([place]) 10/08/2015   Diabetes mellitus type II, non insulin dependent ([place]) 10/08/2015   AKI (acute kidney injury) ([place])    Abscess of left arm 01/01/2013   Migraine    Morbid obesity ([place])    Sleep apnea    Stenosis of left carotid artery    Reflux    Anemia     PCP: Seward Carol, MD  REFERRING PROVIDER: Alric Ran, MD   REFERRING DIAG: R42 (ICD-10-CM) - Dizziness   THERAPY DIAG:  Dizziness and giddiness  Unsteadiness on feet  Other abnormalities of gait and mobility  ONSET DATE: 08/08/2022  Rationale for Evaluation and Treatment: Rehabilitation  SUBJECTIVE:   SUBJECTIVE STATEMENT: Suffers from vertigo. Has had this problem for years, but it has never been this bad. When she gets up in the morning, takes her time. When getting up in the morning with her eyes closed does not feeling the dizziness. Also dizzy when being in the car. Reports dizziness feels like more of an imbalance. Went to the ER twice with vertigo recently. In September, reports that they told her in the ER to do the Epley maneuver at home. Did it some with her  sister and felt a little better from doing the Epley, but did not feel relief for long. Now using a SPC because of the dizziness/imbalance. No falls, just some trips. Taking Meclizine 2x a day and took it last night. Pt has seen an eye doctor recently  Has seen the neurologist and has gotten an MRI recently and it did not show any acute finding such as such as a stroke, or mass or blood products that can cause her dizziness. It showed however changes that are due to age, hypertension and heart disease such as brain atrophy and chronic small vessel disease.   Pt accompanied by: self, family member, and daughter, Jeanne [name]   PERTINENT HISTORY:  PMH: Arthritis, Diabetes mellitus, HLD, HTN, Obesity, Migraine, CKD stage III, CAD, Glaucoma  PAIN:  Are you having pain? No  PRECAUTIONS: Fall  WEIGHT BEARING RESTRICTIONS: No  FALLS: Has patient fallen in last 6 months? No  LIVING ENVIRONMENT: Lives with: lives with their family - 2 daughters  Lives in: House/apartment Stairs: No Has following equipment at home: Single point cane, Rollator   PLOF: Independent daughters does all the driving and cooking the meals.   PATIENT GOALS: Wants to get rid of the vertigo.   OBJECTIVE:   DIAGNOSTIC FINDINGS: MRI brain 08/06/22:  IMPRESSION: This MRI of the brain without contrast shows the following: Multiple single and confluent T2/FLAIR hyperintense foci in the cerebral hemispheres in a pattern most consistent with moderate chronic microvascular ischemic changes.  A focus is also noted in the pons.  There has been some progression compared to the 2017 MRI. Mild generalized cortical atrophy, progressed since the 2017 MRI and stable compared to the 2023 CT scan. Single chronic microhemorrhage in the right hemisphere is stable compared to the 2017 MRI. Mild chronic sphenoid sinusitis The internal auditory canals appeared normal on this noncontrast study.   The pituitary gland has a reduced height within an enlarged sella turcica, unchanged compared to the 2017 MRI.  A partially empty sella is nonspecific and could be incidental or due to elevated intracranial pressure.   No acute findings.  COGNITION: Overall cognitive status: Within functional limits for tasks assessed   POSTURE:  rounded shoulders and forward head   GAIT: Gait pattern: step through pattern, decreased stride length, and trunk flexed Distance walked: In and out of clinic Assistive device utilized: Single point cane Level of assistance: SBA Comments: Unsteadiness, pt feeling more unsteady after vestibular eval. Needing to ambulate slowly.   PATIENT SURVEYS:  Teacher, adult education did not capture.   VESTIBULAR ASSESSMENT:  GENERAL OBSERVATION: Ambulates into session with SPC.    SYMPTOM BEHAVIOR:  Subjective history: See above.   Non-Vestibular symptoms: changes in vision and has seen her eye doctor recently, reports some air whooshing out of her air (noticed it this morning in the shower), did not address with ENT.   Type of dizziness: Imbalance (Disequilibrium), Unsteady with head/body turns, and "Funny feeling in the head"  Frequency: Daily, worse in the morning.   Duration: tops maybe 30 minutes   Aggravating factors: Induced by position change: supine to sit and Induced by motion: turning body quickly, turning head quickly, and sitting in a moving car  Relieving factors: head stationary, closing eyes, and rest  Progression of symptoms: unchanged  OCULOMOTOR EXAM:  Ocular Alignment: normal  Ocular ROM: No Limitations  Spontaneous Nystagmus: absent  Gaze-Induced Nystagmus: absent  Smooth Pursuits: intact and felt pain in her eyes when bringing pen across horizontally  Saccades: intact and reports nose felt it was hurting   VESTIBULAR - OCULAR REFLEX:   Slow VOR: Normal and Comment: mild dizziness  VOR Cancellation: Normal mild dizziness  Head-Impulse Test: HIT Right: negative HIT Left: positive (very slight)  Dynamic Visual Acuity: Will assess at next session.    POSITIONAL TESTING: Right Dix-Hallpike: no nystagmus and mild dizziness when coming upright  Left Dix-Hallpike: no nystagmus and mild dizziness when coming upright  Right Roll Test: no nystagmus and pt reporting that she felt like she was spinning and lasted for ~1 minute and could see 2 of the handles on the door  Left Roll Test: no nystagmus and pt reports mild dizziness and things were spinning  Right Sidelying: no nystagmus and mild dizziness Left Sidelying: no nystagmus and moderate dizzines   MOTION SENSITIVITY:  Motion Sensitivity Quotient Intensity: 0 = none, 1 = Lightheaded,  2 = Mild, 3 = Moderate, 4 = Severe, 5 = Vomiting  Intensity  1. Sitting to supine   2. Supine to L side 2  3. Supine to R side 2  4. Supine to sitting 0  5. L Hallpike-Dix 0  6. Up from L  2  7. R Hallpike-Dix 0  8. Up from R  2  9. Sitting, head tipped to L knee 0  10. Head up from L knee 0  11. Sitting, head tipped to R knee 2  12. Head up from R knee 2  13. Sitting head turns x5 0, reports a woozy feeling  14.Sitting head nods x5 0  15. In stance, 180 turn to L    16. In stance, 180 turn to R       VESTIBULAR TREATMENT:                                                                                                   DATE: 08/16/22  N/A during eval.   PATIENT EDUCATION: Education details: Clinical findings, POC. Not taking Meclizine before next session as it suppresses vestibular system and can mask symptoms and then will need to re-assess positional testing. Pt reporting motion  sensitivity when being in the car, discussed sitting in the front seat and looking out at the horizon.  Person educated: Patient, Child(ren), and pt's daughter Education method: Explanation Education comprehension: verbalized understanding  HOME EXERCISE PROGRAM: Will provide at next session  GOALS: Goals reviewed with patient? Yes  SHORT TERM GOALS: ALL STGS = LTGS   LONG TERM GOALS: Target date: 09/13/2022  Pt will be independent with final HEP for vestibular/balance deficits in order to build upon functional gains made in therapy. Baseline:  Goal status: INITIAL  2.  Pt will be negative for positional testing.  Baseline: Pt with dizziness during eval but no nystagmus, will have to re-assess when pt not on Meclizine  Goal status: INITIAL  3.  mCTSIB to be assessed with goal written. Baseline:  Goal status: INITIAL  4.  DVA to be assessed with goal written. Baseline:  Goal status: INITIAL  5.  Pt will score a 1/5 or less on all  items on MSQ in order to demo improved motion  sensitivity.  Baseline: 0-2/5 Goal status: INITIAL   ASSESSMENT:  CLINICAL IMPRESSION: Patient is a 86 year old female referred to Neuro OPPT for Dizziness.   Pt's PMH is significant for: PMH: Arthritis, Diabetes mellitus, HLD, HTN, Obesity, Migraine, CKD stage III, CAD, Glaucoma. The following deficits were present during the exam: gait abnormalities, impaired balance, positive HIT indicating impaired VOR, dizziness with VOR testing, motion sensitivities, decr functional strength, decr activity tolerance. Pt having dizziness in R/L sidelying positions and when return to upright. Pt with dizziness with R and L rolling, and reported spinning and seeing double when rolling to the R that lasted ~1 minute. PT unable to see any nystagmus. Pt took Meclizine last night. Educated on not taking it before next session as it could potentially be masking pt's symptoms/nystagmus. Pt would benefit from skilled PT to address these impairments and functional limitations to maximize functional mobility independence and decr dizziness.    OBJECTIVE IMPAIRMENTS: Abnormal gait, decreased activity tolerance, decreased balance, decreased endurance, decreased mobility, difficulty walking, dizziness, and postural dysfunction.   ACTIVITY LIMITATIONS: lifting, bending, standing, squatting, transfers, bed mobility, locomotion level, and caring for others  PARTICIPATION LIMITATIONS: meal prep, cleaning, laundry, driving, and community activity  PERSONAL FACTORS: Age, Behavior pattern, Past/current experiences, Time since onset of injury/illness/exacerbation, and 3+ comorbidities: PMH: Arthritis, Diabetes mellitus, HLD, HTN, Obesity, Migraine, CKD stage III, CAD, Glaucoma  are also affecting patient's functional outcome.   REHAB POTENTIAL: Good  CLINICAL DECISION MAKING: Evolving/moderate complexity  EVALUATION COMPLEXITY: Moderate   PLAN:  PT FREQUENCY: 2x/week  PT DURATION: 8 weeks  PLANNED INTERVENTIONS:  Therapeutic exercises, Therapeutic activity, Neuromuscular re-education, Balance training, Gait training, Patient/Family education, Self Care, Joint mobilization, Vestibular training, Canalith repositioning, Visual/preceptual remediation/compensation, DME instructions, and Re-evaluation  PLAN FOR NEXT SESSION: Re-assess positional testing now that pt is not on Meclizine. Assess mCTSIB and DVA and write goals. Initial HEP for VOR and MSQ items.    Arliss Journey, PT, DPT  08/16/2022, 11:27 AM

## 2022-08-15 NOTE — Telephone Encounter (Signed)
Daughter called stating that she has additional questions regarding her mother's treatment due to her still suffering bad headaches. She would like to know what the next step is for her mother to try. Please advise.   Daughter Katharine Look [phone]

## 2022-08-15 NOTE — Telephone Encounter (Signed)
Noted, thank you

## 2022-08-16 ENCOUNTER — Ambulatory Visit: Payer: Medicare Other | Attending: Neurology | Admitting: Physical Therapy

## 2022-08-16 DIAGNOSIS — R2689 Other abnormalities of gait and mobility: Secondary | ICD-10-CM | POA: Insufficient documentation

## 2022-08-16 DIAGNOSIS — R2681 Unsteadiness on feet: Secondary | ICD-10-CM | POA: Diagnosis present

## 2022-08-16 DIAGNOSIS — R42 Dizziness and giddiness: Secondary | ICD-10-CM | POA: Insufficient documentation

## 2022-08-18 ENCOUNTER — Other Ambulatory Visit: Payer: Self-pay | Admitting: Cardiology

## 2022-08-18 DIAGNOSIS — I209 Angina pectoris, unspecified: Secondary | ICD-10-CM

## 2022-08-24 ENCOUNTER — Ambulatory Visit: Payer: Medicare Other

## 2022-08-24 DIAGNOSIS — R42 Dizziness and giddiness: Secondary | ICD-10-CM | POA: Diagnosis not present

## 2022-08-24 DIAGNOSIS — R2681 Unsteadiness on feet: Secondary | ICD-10-CM

## 2022-08-24 DIAGNOSIS — R2689 Other abnormalities of gait and mobility: Secondary | ICD-10-CM

## 2022-08-24 NOTE — Therapy (Signed)
OUTPATIENT PHYSICAL THERAPY VESTIBULAR TREATMENT     Patient Name: Jeanne [name] MRN: [id number] DOB:[birth year], 86 y.o., female Today's Date: 08/24/2022  END OF SESSION:  PT End of Session - 08/24/22 1220     Visit Number 2    Number of Visits 9    Date for PT Re-Evaluation 10/15/22    Authorization Type [employer] Medicare    PT Start Time 1230    PT Stop Time 1323    PT Time Calculation (min) 53 min    Activity Tolerance Patient tolerated treatment well    Behavior During Therapy WFL for tasks assessed/performed             Past Medical History:  Diagnosis Date   Anemia    Arthritis    Carotid arterial disease ([place])    COVID-19    Diabetes mellitus    Hyperlipidemia    Hypertension    Migraine    Obesity    Reflux    Sleep apnea    Past Surgical History:  Procedure Laterality Date   CARDIAC CATHETERIZATION N/A 10/12/2015   Procedure: Left Heart Cath and Coronary Angiography;  Surgeon: Belva Crome, MD;  Location: [facility];  Service: Cardiovascular;  Laterality: N/A;   CHOLECYSTECTOMY  50 yrs ago   JOINT REPLACEMENT Right 2005   knee   WISDOM TOOTH EXTRACTION     Patient Active Problem List   Diagnosis Date Noted   Pseudophakia, both eyes 06/05/2022   Gastroesophageal reflux disease 05/02/2021   Chronic diastolic heart failure ([place]) 10/07/2020   Moderate nonproliferative diabetic retinopathy of both eyes ([place]) 05/25/2020   Chorioretinal scar 05/25/2020   Left epiretinal membrane 05/25/2020   Posterior vitreous detachment of both eyes 05/25/2020   Acute respiratory failure with hypoxia ([place]) 10/11/2019   CAP (community acquired pneumonia) 35/00/9381   Diastolic dysfunction, left ventricle 04/05/2019   Coronary artery disease of native artery of native heart with stable angina pectoris ([place]) 11/23/2018   Hypercholesteremia 11/23/2018   Unstable angina ([place]) 10/12/2015   Abnormal myocardial perfusion study 10/12/2015   NSVT (nonsustained  ventricular tachycardia) ([place]) 10/11/2015   Primary hypertension 10/11/2015   CKD (chronic kidney disease), stage III ([place]) 10/11/2015   Atypical chest pain    Angina pectoris ([place]) 10/08/2015   Diabetes mellitus type II, non insulin dependent ([place]) 10/08/2015   AKI (acute kidney injury) ([place])    Abscess of left arm 01/01/2013   Migraine    Morbid obesity ([place])    Sleep apnea    Stenosis of left carotid artery    Reflux    Anemia     PCP: Seward Carol, MD  REFERRING PROVIDER: Alric Ran, MD   REFERRING DIAG: R42 (ICD-10-CM) - Dizziness   THERAPY DIAG:  Dizziness and giddiness  Unsteadiness on feet  Other abnormalities of gait and mobility  ONSET DATE: 08/08/2022  Rationale for Evaluation and Treatment: Rehabilitation  SUBJECTIVE:   SUBJECTIVE STATEMENT: Patient reports doing fair. Feels "bogged down" by the dizziness. Patient stating that she had to get new dentures and she feels as though the glue is going down her throat and out of her nose. PT requesting that she speak to her dentist regarding this. Reports that the dizziness is still present. Did not take Meclizine last night. Reports that the dizziness is "all in her eyes" today. Sitting at rest in treatment room, patient reports 4/5 dizziness.   Pt accompanied by: self, family member, and daughter, Nancee Liter  HISTORY: PMH: Arthritis, Diabetes mellitus, HLD, HTN, Obesity, Migraine, CKD stage III, CAD, Glaucoma  PAIN:  Are you having pain? No  PRECAUTIONS: Fall   PATIENT GOALS: Wants to get rid of the vertigo.   PATIENT SURVEYS:  FOTO [phone]  VESTIBULAR ASSESSMENT   POSITIONAL TESTING: Right Dix-Hallpike: no nystagmus Left Dix-Hallpike: no nystagmus Right Roll Test: no nystagmus and patient with saccadic eye movement moving into position and out of position  Left Roll Test: no nystagmus and patient reports no dizziness, but did have vertical diplopia reporting 2 clocks 1 on top of the other    Upon sitting up from positional testing, patient then reporting horizontal diplopia where previously it was vertical  MOTION SENSITIVITY:  Motion Sensitivity Quotient Intensity: 0 = none, 1 = Lightheaded, 2 = Mild, 3 = Moderate, 4 = Severe, 5 = Vomiting  Intensity  1. Sitting to supine 3  2. Supine to L side 2  3. Supine to R side 2  4. Supine to sitting 0  5. L Hallpike-Dix 0  6. Up from L  2  7. R Hallpike-Dix 0  8. Up from R  2  9. Sitting, head tipped to L knee 0  10. Head up from L knee 0  11. Sitting, head tipped to R knee 2  12. Head up from R knee 2  13. Sitting head turns x5 0, reports a woozy feeling  14.Sitting head nods x5 0  15. In stance, 180 turn to L    16. In stance, 180 turn to R       VESTIBULAR TREATMENT:                                                                                                   -patient with multiple questions regarding origin of current problem and potential causes (asked prior to PT testing, so PT unable to give fully accurate answer)  -FOTO: [id number]  PATIENT EDUCATION: Education details: see above, habituation with rolling, f/u with neurologist re: diplopia, exam results Person educated: Patient, Child(ren), and pt's daughter Education method: Explanation Education comprehension: verbalized understanding and needs further education  HOME EXERCISE PROGRAM: Rolling habituation  GOALS: Goals reviewed with patient? Yes  SHORT TERM GOALS: ALL STGS = LTGS   LONG TERM GOALS: Target date: 09/13/2022  Pt will be independent with final HEP for vestibular/balance deficits in order to build upon functional gains made in therapy. Baseline:  Goal status: INITIAL  2.  Pt will be negative for positional testing.  Baseline: Pt with dizziness during eval but no nystagmus, will have to re-assess when pt not on Meclizine  Goal status: INITIAL  3.  mCTSIB to be assessed with goal written. Baseline:  Goal status: INITIAL  4.  DVA to be  assessed with goal written. Baseline:  Goal status: INITIAL  5.  Pt will score a 1/5 or less on all items on MSQ in order to demo improved motion sensitivity.  Baseline: 0-2/5 Goal status: INITIAL   ASSESSMENT:  CLINICAL IMPRESSION: Patient seen for skilled PT session with emphasis on vestibular assessment  now that patient does not have Meclizine in her system. At rest, with no movement, patient reporting 4/5 dizziness before exam components. She does present with general motion sensitivity. Patient reporting that she recently had her glasses prescription adjusted, but they remain progressive lenses, which she had previously. Patient reporting vertical diplopia in L roll test position, which did not correct overtime. Glasses were not worn at time of assessment. Upon completion of positional testing and sitting up, patient reporting horizontal diplopia. PT unsure of origin of this diplopia and it would warrant further assessment from neurologist. Continue POC as able.    OBJECTIVE IMPAIRMENTS: Abnormal gait, decreased activity tolerance, decreased balance, decreased endurance, decreased mobility, difficulty walking, dizziness, and postural dysfunction.   ACTIVITY LIMITATIONS: lifting, bending, standing, squatting, transfers, bed mobility, locomotion level, and caring for others  PARTICIPATION LIMITATIONS: meal prep, cleaning, laundry, driving, and community activity  PERSONAL FACTORS: Age, Behavior pattern, Past/current experiences, Time since onset of injury/illness/exacerbation, and 3+ comorbidities: PMH: Arthritis, Diabetes mellitus, HLD, HTN, Obesity, Migraine, CKD stage III, CAD, Glaucoma  are also affecting patient's functional outcome.   REHAB POTENTIAL: Good  CLINICAL DECISION MAKING: Evolving/moderate complexity  EVALUATION COMPLEXITY: Moderate   PLAN:  PT FREQUENCY: 2x/week  PT DURATION: 8 weeks  PLANNED INTERVENTIONS: Therapeutic exercises, Therapeutic activity,  Neuromuscular re-education, Balance training, Gait training, Patient/Family education, Self Care, Joint mobilization, Vestibular training, Canalith repositioning, Visual/preceptual remediation/compensation, DME instructions, and Re-evaluation  PLAN FOR NEXT SESSION: Assess mCTSIB and DVA and write goals. Initial HEP for VOR and MSQ items.    Debbora Dus, PT, DPT  Debbora Dus, PT, DPT, CBIS  08/24/2022, 2:59 PM

## 2022-08-29 ENCOUNTER — Ambulatory Visit: Payer: Medicare Other

## 2022-08-29 DIAGNOSIS — R42 Dizziness and giddiness: Secondary | ICD-10-CM

## 2022-08-29 DIAGNOSIS — R2689 Other abnormalities of gait and mobility: Secondary | ICD-10-CM

## 2022-08-29 DIAGNOSIS — R2681 Unsteadiness on feet: Secondary | ICD-10-CM

## 2022-08-29 NOTE — Therapy (Signed)
OUTPATIENT PHYSICAL THERAPY VESTIBULAR TREATMENT     Patient Name: Jeanne [name] MRN: [id number] DOB:[birth year], 86 y.o., female Today's Date: 08/29/2022  END OF SESSION:  PT End of Session - 08/29/22 1442     Visit Number 3    Number of Visits 9    Date for PT Re-Evaluation 10/15/22    Authorization Type [employer] Medicare    PT Start Time 1440    PT Stop Time 1518    PT Time Calculation (min) 38 min    Activity Tolerance Patient tolerated treatment well    Behavior During Therapy WFL for tasks assessed/performed             Past Medical History:  Diagnosis Date   Anemia    Arthritis    Carotid arterial disease ([place])    COVID-19    Diabetes mellitus    Hyperlipidemia    Hypertension    Migraine    Obesity    Reflux    Sleep apnea    Past Surgical History:  Procedure Laterality Date   CARDIAC CATHETERIZATION N/A 10/12/2015   Procedure: Left Heart Cath and Coronary Angiography;  Surgeon: Belva Crome, MD;  Location: [facility];  Service: Cardiovascular;  Laterality: N/A;   CHOLECYSTECTOMY  50 yrs ago   JOINT REPLACEMENT Right 2005   knee   WISDOM TOOTH EXTRACTION     Patient Active Problem List   Diagnosis Date Noted   Pseudophakia, both eyes 06/05/2022   Gastroesophageal reflux disease 05/02/2021   Chronic diastolic heart failure ([place]) 10/07/2020   Moderate nonproliferative diabetic retinopathy of both eyes ([place]) 05/25/2020   Chorioretinal scar 05/25/2020   Left epiretinal membrane 05/25/2020   Posterior vitreous detachment of both eyes 05/25/2020   Acute respiratory failure with hypoxia ([place]) 10/11/2019   CAP (community acquired pneumonia) 78/29/5621   Diastolic dysfunction, left ventricle 04/05/2019   Coronary artery disease of native artery of native heart with stable angina pectoris ([place]) 11/23/2018   Hypercholesteremia 11/23/2018   Unstable angina ([place]) 10/12/2015   Abnormal myocardial perfusion study 10/12/2015   NSVT (nonsustained  ventricular tachycardia) ([place]) 10/11/2015   Primary hypertension 10/11/2015   CKD (chronic kidney disease), stage III ([place]) 10/11/2015   Atypical chest pain    Angina pectoris ([place]) 10/08/2015   Diabetes mellitus type II, non insulin dependent ([place]) 10/08/2015   AKI (acute kidney injury) ([employer] [facility])    Abscess of left arm 01/01/2013   Migraine    Morbid obesity ([place])    Sleep apnea    Stenosis of left carotid artery    Reflux    Anemia     PCP: Seward Carol, MD  REFERRING PROVIDER: Alric Ran, MD   REFERRING DIAG: R42 (ICD-10-CM) - Dizziness   THERAPY DIAG:  Dizziness and giddiness  Unsteadiness on feet  Other abnormalities of gait and mobility  ONSET DATE: 08/08/2022  Rationale for Evaluation and Treatment: Rehabilitation  SUBJECTIVE:   SUBJECTIVE STATEMENT: Patient present with daughter, [name]. She reports that dizziness has been overall better. States that she was doing her exercises on Christmas Eve and heard/felt "popping" in her inner ear bilaterally and since then has been reporting less "wooziness" and less HA/pain behind the eyes. Also reports no double vision since last visit.   Pt accompanied by: self, family member, and daughter, [name]   PERTINENT HISTORY: PMH: Arthritis, Diabetes mellitus, HLD, HTN, Obesity, Migraine, CKD stage III, CAD, Glaucoma  PAIN:  Are you having pain? No  PRECAUTIONS: Fall  PATIENT GOALS: Wants to get rid of the vertigo.     VESTIBULAR TREATMENT:                                                                                                   -extensive conversation and education regarding etiology of her dizziness  -discussed going on hold (per patient and family request) since she is feeling better   -to return to PT if she experiences dizziness again   PATIENT EDUCATION: Education details: see above, continue with habituation HEP, keep neuro and ENT appts, f/u with PT PRN  Person educated: Patient, Child(ren), and  pt's daughter Education method: Explanation Education comprehension: verbalized understanding and needs further education  HOME EXERCISE PROGRAM: Rolling habituation  GOALS: Goals reviewed with patient? Yes  SHORT TERM GOALS: ALL STGS = LTGS   LONG TERM GOALS: Target date: 09/13/2022  Pt will be independent with final HEP for vestibular/balance deficits in order to build upon functional gains made in therapy. Baseline:  Goal status: INITIAL  2.  Pt will be negative for positional testing.  Baseline: Pt with dizziness during eval but no nystagmus, will have to re-assess when pt not on Meclizine  Goal status: INITIAL  3.  mCTSIB to be assessed with goal written. Baseline:  Goal status: INITIAL  4.  DVA to be assessed with goal written. Baseline:  Goal status: INITIAL  5.  Pt will score a 1/5 or less on all items on MSQ in order to demo improved motion sensitivity.  Baseline: 0-2/5 Goal status: INITIAL   ASSESSMENT:  CLINICAL IMPRESSION: Patient seen for skilled PT session with emphasis on patient and family education. Patient and dtr with multiple questions regarding potential medical causes for onset of vertigo. PT deferring to appropriate medical providers to answer those questions as it is out of the Pts scope of practice to provide medical diagnoses. Patient has reported "popping" in her ears like a "puff of air" and then reduction in both her dizziness and diplopia. Patient requesting to go on hold for 30 days to see if vertigo has truly resolved since her ears, likely, equalized. Patient to return to PT within 30 days if dizziness returns, but after 30 days, patient would require a new referral if not seen within that time. Continue POC as able.    OBJECTIVE IMPAIRMENTS: Abnormal gait, decreased activity tolerance, decreased balance, decreased endurance, decreased mobility, difficulty walking, dizziness, and postural dysfunction.   ACTIVITY LIMITATIONS: lifting, bending,  standing, squatting, transfers, bed mobility, locomotion level, and caring for others  PARTICIPATION LIMITATIONS: meal prep, cleaning, laundry, driving, and community activity  PERSONAL FACTORS: Age, Behavior pattern, Past/current experiences, Time since onset of injury/illness/exacerbation, and 3+ comorbidities: PMH: Arthritis, Diabetes mellitus, HLD, HTN, Obesity, Migraine, CKD stage III, CAD, Glaucoma  are also affecting patient's functional outcome.   REHAB POTENTIAL: Good  CLINICAL DECISION MAKING: Evolving/moderate complexity  EVALUATION COMPLEXITY: Moderate   PLAN:  PT FREQUENCY: 2x/week  PT DURATION: 8 weeks  PLANNED INTERVENTIONS: Therapeutic exercises, Therapeutic activity, Neuromuscular re-education, Balance training, Gait training, Patient/Family education, Self Care, Joint mobilization, Vestibular training,  Canalith repositioning, Visual/preceptual remediation/compensation, DME instructions, and Re-evaluation  PLAN FOR NEXT SESSION: going on hold for 30 days.    Debbora Dus, PT, DPT  Debbora Dus, PT, DPT, CBIS  08/29/2022, 3:29 PM

## 2022-08-31 ENCOUNTER — Ambulatory Visit: Payer: Medicare Other | Admitting: Physical Therapy

## 2022-09-05 ENCOUNTER — Ambulatory Visit: Payer: Medicare Other | Admitting: Physical Therapy

## 2022-09-07 ENCOUNTER — Encounter: Payer: Medicare Other | Admitting: Physical Therapy

## 2022-09-12 ENCOUNTER — Ambulatory Visit: Payer: Medicare Other | Admitting: Physical Therapy

## 2022-09-12 ENCOUNTER — Ambulatory Visit: Payer: Medicare Other

## 2022-09-12 ENCOUNTER — Encounter: Payer: Medicare Other | Admitting: Physical Therapy

## 2022-09-14 ENCOUNTER — Ambulatory Visit: Payer: Medicare Other | Admitting: Physical Therapy

## 2022-09-14 ENCOUNTER — Encounter: Payer: Medicare Other | Admitting: Physical Therapy

## 2022-09-14 ENCOUNTER — Ambulatory Visit: Payer: Medicare Other | Admitting: Neurology

## 2022-09-16 ENCOUNTER — Other Ambulatory Visit: Payer: Self-pay | Admitting: Cardiology

## 2022-09-16 DIAGNOSIS — I519 Heart disease, unspecified: Secondary | ICD-10-CM

## 2022-09-16 DIAGNOSIS — I5032 Chronic diastolic (congestive) heart failure: Secondary | ICD-10-CM

## 2022-09-28 ENCOUNTER — Ambulatory Visit: Payer: Medicare Other | Attending: Neurology | Admitting: Physical Therapy

## 2022-09-28 ENCOUNTER — Encounter: Payer: Self-pay | Admitting: Physical Therapy

## 2022-09-28 DIAGNOSIS — R42 Dizziness and giddiness: Secondary | ICD-10-CM | POA: Insufficient documentation

## 2022-09-28 DIAGNOSIS — R2689 Other abnormalities of gait and mobility: Secondary | ICD-10-CM | POA: Insufficient documentation

## 2022-09-28 DIAGNOSIS — R2681 Unsteadiness on feet: Secondary | ICD-10-CM | POA: Insufficient documentation

## 2022-09-28 NOTE — Therapy (Signed)
OUTPATIENT PHYSICAL THERAPY VESTIBULAR TREATMENT     Patient Name: Jeanne [name] MRN: [id number] DOB:Aug 24, 1936, 87 y.o., female Today's Date: 09/28/2022  END OF SESSION:  PT End of Session - 09/28/22 1149     Visit Number 4    Number of Visits 9    Date for PT Re-Evaluation 10/15/22    Authorization Type [employer] Medicare    PT Start Time 1147    PT Stop Time 1229    PT Time Calculation (min) 42 min    Activity Tolerance Patient tolerated treatment well    Behavior During Therapy WFL for tasks assessed/performed             Past Medical History:  Diagnosis Date   Anemia    Arthritis    Carotid arterial disease ([place])    COVID-19    Diabetes mellitus    Hyperlipidemia    Hypertension    Migraine    Obesity    Reflux    Sleep apnea    Past Surgical History:  Procedure Laterality Date   CARDIAC CATHETERIZATION N/A 10/12/2015   Procedure: Left Heart Cath and Coronary Angiography;  Surgeon: Belva Crome, MD;  Location: [facility];  Service: Cardiovascular;  Laterality: N/A;   CHOLECYSTECTOMY  50 yrs ago   JOINT REPLACEMENT Right 2005   knee   WISDOM TOOTH EXTRACTION     Patient Active Problem List   Diagnosis Date Noted   Pseudophakia, both eyes 06/05/2022   Gastroesophageal reflux disease 05/02/2021   Chronic diastolic heart failure ([place]) 10/07/2020   Moderate nonproliferative diabetic retinopathy of both eyes ([place]) 05/25/2020   Chorioretinal scar 05/25/2020   Left epiretinal membrane 05/25/2020   Posterior vitreous detachment of both eyes 05/25/2020   Acute respiratory failure with hypoxia ([place]) 10/11/2019   CAP (community acquired pneumonia) 15/17/6160   Diastolic dysfunction, left ventricle 04/05/2019   Coronary artery disease of native artery of native heart with stable angina pectoris ([place]) 11/23/2018   Hypercholesteremia 11/23/2018   Unstable angina ([place]) 10/12/2015   Abnormal myocardial perfusion study 10/12/2015   NSVT (nonsustained  ventricular tachycardia) ([place]) 10/11/2015   Primary hypertension 10/11/2015   CKD (chronic kidney disease), stage III ([place]) 10/11/2015   Atypical chest pain    Angina pectoris ([place]) 10/08/2015   Diabetes mellitus type II, non insulin dependent ([place]) 10/08/2015   AKI (acute kidney injury) ([place])    Abscess of left arm 01/01/2013   Migraine    Morbid obesity ([place])    Sleep apnea    Stenosis of left carotid artery    Reflux    Anemia     PCP: Seward Carol, MD  REFERRING PROVIDER: Alric Ran, MD   REFERRING DIAG: R42 (ICD-10-CM) - Dizziness   THERAPY DIAG:  Dizziness and giddiness  Unsteadiness on feet  Other abnormalities of gait and mobility  ONSET DATE: 08/08/2022  Rationale for Evaluation and Treatment: Rehabilitation  SUBJECTIVE:   SUBJECTIVE STATEMENT:  Thought that she was getting better, but then started feeling like she was having the same symptoms over again. Has been doing the rolling for habituation and reports that it has not been working and it has been making her more dizzy. Reports that the dizziness is not getting any better. Reports that when she goes to take a shower and has to bend over, still having a dizziness. Reports then her head is gonna be woozy. Still having a headache in the top of her head. Feels like she has a  headache behind her eyes. Has to hold onto one of her daughters all the time due to fear of falling, uses the cane or the rollator. Biggest thing is in the back of her eyes -feels like its an ache or a pain and it brings on a headache.   Pt accompanied by: self, family member, and daughter, [name]   PERTINENT HISTORY: PMH: Arthritis, Diabetes mellitus, HLD, HTN, Obesity, Migraine, CKD stage III, CAD, Glaucoma  PAIN:  Are you having pain? No  PRECAUTIONS: Fall   PATIENT GOALS: Wants to get rid of the vertigo.     VESTIBULAR TREATMENT:       Had long discussion at start of session with pt and pt's daughter about pt's  current symptoms and what has changed since she was last here (went on hold for approx a month). Pt reporting some imbalance and dizziness with bending over. Pt using cane and/or rollator at all times for balance, educated to keep using this at all times. Pt continues to complain of a headache/pain between L eye and has not told her doctor regarding this. Discussed that this is something that she will need to mention to her PCP. Decided POC going forwards to add 1x week for 2 weeks to address further vestibular habituation exercises to see if that helps her dizziness at all.                                                                                               MOTION SENSITIVITY:            Motion Sensitivity Quotient Intensity: 0 = none, 1 = Lightheaded, 2 = Mild, 3 = Moderate, 4 = Severe, 5 = Vomiting   Intensity  1. Sitting to supine   2. Supine to L side   3. Supine to R side   4. Supine to sitting   5. L Hallpike-Dix   6. Up from L    7. R Hallpike-Dix   8. Up from R    9. Sitting, head tipped to L knee 0  10. Head up from L knee 3, feels pain in her head  11. Sitting, head tipped to R knee 0  12. Head up from R knee 3, feels the dizziness behind her eyes  13. Sitting head turns x5 2, feeling a little bit nauseous  14.Sitting head nods x5 3, feeling a little nauseous  15. In stance, 180 turn to L     16. In stance, 180 turn to R                 Habituation: Seated Vertical Head Movements: number of reps: 2 sets of 5 reps, , Seated Horizontal Head Movements: number of reps: 2 sets of 5 reps, some dizziness behind the eyes and wooziness,  Bending/Reaching to the Floor: number of reps: 5 reps, pt initially reporting a funny feeling in her head/behind her eyes but improved with incr reps and after the 5th rep, did not have any symptoms    Pt did well with head motions when performing slowly.    PATIENT EDUCATION: Education  details: See above, had lengthy discussion  regarding purpose of Habituation exercises in regards to dizziness, added habituation exercises for bending/head motions to HEP  Person educated: Patient, Child(ren), and pt's daughter Education method: Explanation, Demonstration, Verbal cues, and Handouts Education comprehension: verbalized understanding, returned demonstration, and needs further education  HOME EXERCISE PROGRAM: Rolling habituation  New additions below on 09/28/22  Bending habituation   Access Code: [email or web address] URL: [email or web address] Date: 09/28/2022 Prepared by: Janann August  Exercises - Seated LEFT AND RIGHT Head Turns  - 2 x daily - 7 x weekly - 2 sets - 5 reps - Seated Head Nods Vestibular Habituation  - 2 x daily - 7 x weekly - 2 sets - 5 reps   GOALS: Goals reviewed with patient? Yes  SHORT TERM GOALS: ALL STGS = LTGS   LONG TERM GOALS: Target date: 09/13/2022   UPDATED GOAL DATE DUE TO 8 WEEK POC: 10/15/22  Pt will be independent with final HEP for vestibular/balance deficits in order to build upon functional gains made in therapy. Baseline:  Goal status: INITIAL  2.  Pt will be negative for positional testing.  Baseline: Pt with dizziness during eval but no nystagmus, will have to re-assess when pt not on Meclizine  Goal status: INITIAL  3.  mCTSIB to be assessed with goal written. Baseline:  Goal status: INITIAL  4.  DVA to be assessed with goal written. Baseline:  Goal status: INITIAL  5.  Pt will score a 1/5 or less on all items on MSQ in order to demo improved motion sensitivity.  Baseline: 0-2/5 Goal status: INITIAL   ASSESSMENT:  CLINICAL IMPRESSION: Pt returns for PT session after being on hold for a month. Pt reporting that she continues to have dizziness and imbalance, and has it more when she is bending over in the shower when seated on her shower chair. Pt has been performing previous habituation rolling exercises and reports that they are not helping. Pt  continues to complain of L eye pain/headache behind her eyes and discussed that she would need to follow up with her PCP regarding this. Also continued to educate that pt would need to follow up with her ENT regarding the continued "popping" in her ears (pt had this complaint last time, but did not follow up). Began to re-assess MSQ testing with pt having mild/mod dizziness with bending tasks (esp coming up) and with head motions. Added to pt's HEP for habituation which pt having an improvement in symptoms with incr reps. Will schedule pt for 1x week for 2 weeks to address motion sensitivities to see if that will help pt's dizziness. Otherwise, continued to educate pt and pt's daughter that she will need to follow up with her physicians regarding other complaints at this time with her eyes and popping in her ears. LTG date on-going and updated due to 8 week POC.     OBJECTIVE IMPAIRMENTS: Abnormal gait, decreased activity tolerance, decreased balance, decreased endurance, decreased mobility, difficulty walking, dizziness, and postural dysfunction.   ACTIVITY LIMITATIONS: lifting, bending, standing, squatting, transfers, bed mobility, locomotion level, and caring for others  PARTICIPATION LIMITATIONS: meal prep, cleaning, laundry, driving, and community activity  PERSONAL FACTORS: Age, Behavior pattern, Past/current experiences, Time since onset of injury/illness/exacerbation, and 3+ comorbidities: PMH: Arthritis, Diabetes mellitus, HLD, HTN, Obesity, Migraine, CKD stage III, CAD, Glaucoma  are also affecting patient's functional outcome.   REHAB POTENTIAL: Good  CLINICAL DECISION MAKING: Evolving/moderate complexity  EVALUATION COMPLEXITY: Moderate   PLAN:  PT  FREQUENCY: 2x/week  PT DURATION: 8 weeks  PLANNED INTERVENTIONS: Therapeutic exercises, Therapeutic activity, Neuromuscular re-education, Balance training, Gait training, Patient/Family education, Self Care, Joint mobilization,  Vestibular training, Canalith repositioning, Visual/preceptual remediation/compensation, DME instructions, and Re-evaluation  PLAN FOR NEXT SESSION: how did habituation exercises go? Progress these. Try standing balance and seated VOR    Akima Slaugh N Helvi Royals, PT, DPT   09/28/2022, 1:10 PM

## 2022-09-28 NOTE — Patient Instructions (Addendum)
Bending / Picking Up Objects    Sitting, slowly bend head down and pick up object on the floor (or tap to an object on the floor).  Return to upright position. Hold position until symptoms subside. Perform going to both right and left.  Repeat __5__ times per session. Do __2-3__ sessions per day.  Copyright  VHI. All rights reserved.

## 2022-10-03 ENCOUNTER — Ambulatory Visit: Payer: Medicare Other | Admitting: Physical Therapy

## 2022-10-03 ENCOUNTER — Encounter: Payer: Self-pay | Admitting: Physical Therapy

## 2022-10-03 VITALS — BP 112/67 | HR 91

## 2022-10-03 DIAGNOSIS — R2689 Other abnormalities of gait and mobility: Secondary | ICD-10-CM

## 2022-10-03 DIAGNOSIS — R42 Dizziness and giddiness: Secondary | ICD-10-CM | POA: Diagnosis not present

## 2022-10-03 DIAGNOSIS — R2681 Unsteadiness on feet: Secondary | ICD-10-CM

## 2022-10-03 NOTE — Therapy (Signed)
OUTPATIENT PHYSICAL THERAPY VESTIBULAR TREATMENT     Patient Name: Jeanne [name] MRN: [id number] DOB:11-01-1935, 87 y.o., female Today's Date: 10/03/2022  END OF SESSION:  PT End of Session - 10/03/22 0927     Visit Number 5    Number of Visits 9    Date for PT Re-Evaluation 10/15/22    Authorization Type U[employer] Medicare    PT Start Time 0925    PT Stop Time 1009    PT Time Calculation (min) 44 min    Activity Tolerance Patient tolerated treatment well    Behavior During Therapy [facility] for tasks assessed/performed             Past Medical History:  Diagnosis Date   Anemia    Arthritis    Carotid arterial disease ([place])    COVID-19    Diabetes mellitus    Hyperlipidemia    Hypertension    Migraine    Obesity    Reflux    Sleep apnea    Past Surgical History:  Procedure Laterality Date   CARDIAC CATHETERIZATION N/A 10/12/2015   Procedure: Left Heart Cath and Coronary Angiography;  Surgeon: Belva Crome, MD;  Location: [place] [facility];  Service: Cardiovascular;  Laterality: N/A;   CHOLECYSTECTOMY  50 yrs ago   JOINT REPLACEMENT Right 2005   knee   WISDOM TOOTH EXTRACTION     Patient Active Problem List   Diagnosis Date Noted   Pseudophakia, both eyes 06/05/2022   Gastroesophageal reflux disease 05/02/2021   Chronic diastolic heart failure ([place]) 10/07/2020   Moderate nonproliferative diabetic retinopathy of both eyes ([place]) 05/25/2020   Chorioretinal scar 05/25/2020   Left epiretinal membrane 05/25/2020   Posterior vitreous detachment of both eyes 05/25/2020   Acute respiratory failure with hypoxia ([place]) 10/11/2019   CAP (community acquired pneumonia) 62/13/0865   Diastolic dysfunction, left ventricle 04/05/2019   Coronary artery disease of native artery of native heart with stable angina pectoris ([place]) 11/23/2018   Hypercholesteremia 11/23/2018   Unstable angina ([place]) 10/12/2015   Abnormal myocardial perfusion study 10/12/2015   NSVT (nonsustained  ventricular tachycardia) ([place]) 10/11/2015   Primary hypertension 10/11/2015   CKD (chronic kidney disease), stage III ([place]) 10/11/2015   Atypical chest pain    Angina pectoris ([place]) 10/08/2015   Diabetes mellitus type II, non insulin dependent ([place]) 10/08/2015   AKI (acute kidney injury) ([place])    Abscess of left arm 01/01/2013   Migraine    Morbid obesity ([place])    Sleep apnea    Stenosis of left carotid artery    Reflux    Anemia     PCP: Seward Carol, MD  REFERRING PROVIDER: Alric Ran, MD   REFERRING DIAG: R42 (ICD-10-CM) - Dizziness   THERAPY DIAG:  Dizziness and giddiness  Unsteadiness on feet  Other abnormalities of gait and mobility  ONSET DATE: 08/08/2022  Rationale for Evaluation and Treatment: Rehabilitation  SUBJECTIVE:   SUBJECTIVE STATEMENT:  Has been trying the exercises at home. Reports the dizziness goes and comes, but feels like it is getting a little better. When walking in with her cane today, felt like she was going to fall. Reports feeling a little wooziness from getting out of the car.   Pt accompanied by: self, family member, and daughter, [name]   PERTINENT HISTORY: PMH: Arthritis, Diabetes mellitus, HLD, HTN, Obesity, Migraine, CKD stage III, CAD, Glaucoma  PAIN:  Are you having pain? No  Vitals:   10/03/22 0940 10/03/22 7846  BP: 125/69 112/67  Pulse: 80 91    Sitting, Standing   PRECAUTIONS: Fall   PATIENT GOALS: Wants to get rid of the vertigo.     VESTIBULAR TREATMENT:     Therapeutic Activity: VESTIBULAR - OCULAR REFLEX:             Slow VOR: Normal and Comment: mild dizziness/wooziness             VOR Cancellation: Normal mild dizziness            Head-Impulse Test: HIT Right: negative HIT Left: positive  Re-assessed VOR from last assessed at eval on 08/16/22 (no change).    Had discussion again with pt and pt's daughter throughout session that PT does not know the cause of pt's dizziness and that she  should follow up with her PCP/ENT about pt's other medical issues (pt reporting popping in her ears and L ear pain). Also educated to follow up with potential orthostatics (none noted today, but pt did have some at neurologist appt on 07/20/22 and unsure if they followed up with PCP regarding this, but pt's daughter does report that they need to pick up some of pt's medications). Educated and reviewed purpose of habituation exercises   NMR:  Habituation: Seated Vertical Head Movements: number of reps: 5 reps, reports not feeling as bad as from going side to side, cued to slightly incr speed  Seated Horizontal Head Movements: number of reps: 5 reps, feeling some wooziness, cued to slightly incr speed   Gaze Adaptation: x1 Viewing Horizontal: Position: Seated, Time: 2 x 30 seconds, 60 seconds, Reps: 3, and Comment: Pt reporting no dizziness/wooziness and x1 Viewing Vertical:  Position: Seated , Time: 30 seconds, 60 seconds, Reps: 2, and Comment: No wooziness/dizziness   VOR cancellation 2 x 10 reps, pt with mild wooziness afterwards that subsided quickly  Reviewed bending habituation with pt and pt's daughter and proper technique (making sure pt goes down to both sides, as pt originally just going down to the R)  PATIENT EDUCATION: Education details: See  therapeutic activity section above, addition of VOR cancellation to HEP  Person educated: Patient, Child(ren), and pt's daughter Education method: Explanation, Demonstration, Verbal cues, and Handouts Education comprehension: verbalized understanding, returned demonstration, and needs further education  HOME EXERCISE PROGRAM: Rolling habituation Bending habituation   Access Code: [email or web address] URL: [email or web address] Date: 10/03/2022 Prepared by: Janann August  Exercises - Seated LEFT AND RIGHT Head Turns  - 2 x daily - 7 x weekly - 2 sets - 5 reps - Seated Head Nods Vestibular Habituation  - 2 x daily - 7 x weekly - 2  sets - 5 reps - Seated VOR Cancellation  - 2 x daily - 7 x weekly - 2 sets - 10 reps   GOALS: Goals reviewed with patient? Yes  SHORT TERM GOALS: ALL STGS = LTGS   LONG TERM GOALS: Target date: 09/13/2022   UPDATED GOAL DATE DUE TO 8 WEEK POC: 10/15/22  Pt will be independent with final HEP for vestibular/balance deficits in order to build upon functional gains made in therapy. Baseline:  Goal status: INITIAL  2.  Pt will be negative for positional testing.  Baseline: Pt with dizziness during eval but no nystagmus, will have to re-assess when pt not on Meclizine  Goal status: INITIAL  3.  mCTSIB to be assessed with goal written. Baseline:  Goal status: INITIAL  4.  DVA to be assessed with goal written. Baseline:  Goal status: INITIAL  5.  Pt will score a 1/5 or less on all items on MSQ in order to demo improved motion sensitivity.  Baseline: 0-2/5 Goal status: INITIAL   ASSESSMENT:  CLINICAL IMPRESSION:  Since pt was last here, pt has done her habituation exercises and does report some improvements, but pt still reporting vague dizziness and feelings of wooziness. Assessed BP in sitting and standing today with pt having a drop in her systolic value, but not orthostatic. Pt was previously orthostatic in neurologist's office in November and it was advised for her to follow-up with this with her PCP. Pt goes to see PCP tomorrow. Re-assessed VOR, with pt having mild dizziness and a positive HIT to the L, indicating impaired VOR. Performed seated VOR exercises, but pt having no dizziness when performing. Pt did have dizziness with VOR cancellation, added these to pt's HEP. Pt continues to report "popping" in her ear and continued to educate further follow-up regarding this with an ENT. PT unsure of pt's cause of dizziness as it appears multi-factorial. Will continue to progress towards LTGs as appropriate.      OBJECTIVE IMPAIRMENTS: Abnormal gait, decreased activity tolerance,  decreased balance, decreased endurance, decreased mobility, difficulty walking, dizziness, and postural dysfunction.   ACTIVITY LIMITATIONS: lifting, bending, standing, squatting, transfers, bed mobility, locomotion level, and caring for others  PARTICIPATION LIMITATIONS: meal prep, cleaning, laundry, driving, and community activity  PERSONAL FACTORS: Age, Behavior pattern, Past/current experiences, Time since onset of injury/illness/exacerbation, and 3+ comorbidities: PMH: Arthritis, Diabetes mellitus, HLD, HTN, Obesity, Migraine, CKD stage III, CAD, Glaucoma  are also affecting patient's functional outcome.   REHAB POTENTIAL: Good  CLINICAL DECISION MAKING: Evolving/moderate complexity  EVALUATION COMPLEXITY: Moderate   PLAN:  PT FREQUENCY: 2x/week  PT DURATION: 8 weeks  PLANNED INTERVENTIONS: Therapeutic exercises, Therapeutic activity, Neuromuscular re-education, Balance training, Gait training, Patient/Family education, Self Care, Joint mobilization, Vestibular training, Canalith repositioning, Visual/preceptual remediation/compensation, DME instructions, and Re-evaluation  PLAN FOR NEXT SESSION: how was PCP appt? how did habituation exercises go? Progress these. Try standing balance, standing VOR, progress VOR cancellation.    Arliss Journey, PT, DPT   10/03/2022, 10:19 AM

## 2022-10-12 ENCOUNTER — Ambulatory Visit: Payer: Medicare Other | Admitting: Physical Therapy

## 2022-10-13 ENCOUNTER — Ambulatory Visit: Payer: Medicare Other | Attending: Neurology | Admitting: Physical Therapy

## 2022-10-13 ENCOUNTER — Encounter: Payer: Self-pay | Admitting: Physical Therapy

## 2022-10-13 DIAGNOSIS — R2681 Unsteadiness on feet: Secondary | ICD-10-CM | POA: Insufficient documentation

## 2022-10-13 DIAGNOSIS — R2689 Other abnormalities of gait and mobility: Secondary | ICD-10-CM | POA: Diagnosis present

## 2022-10-13 DIAGNOSIS — R42 Dizziness and giddiness: Secondary | ICD-10-CM | POA: Insufficient documentation

## 2022-10-13 NOTE — Therapy (Signed)
OUTPATIENT PHYSICAL THERAPY VESTIBULAR TREATMENT/DISCHARGE SUMMARY     Patient Name: Jeanne [name] MRN: [id number] DOB:03-09-1936, 87 y.o., female Today's Date: 10/13/2022  END OF SESSION:  PT End of Session - 10/13/22 1235     Visit Number 6    Number of Visits 9    Date for PT Re-Evaluation 10/15/22    Authorization Type UHC Medicare    PT Start Time 1232    PT Stop Time 1317    PT Time Calculation (min) 45 min    Activity Tolerance Patient tolerated treatment well    Behavior During Therapy WFL for tasks assessed/performed             Past Medical History:  Diagnosis Date   Anemia    Arthritis    Carotid arterial disease ([place])    COVID-19    Diabetes mellitus    Hyperlipidemia    Hypertension    Migraine    Obesity    Reflux    Sleep apnea    Past Surgical History:  Procedure Laterality Date   CARDIAC CATHETERIZATION N/A 10/12/2015   Procedure: Left Heart Cath and Coronary Angiography;  Surgeon: Belva Crome, MD;  Location: [place] [facility];  Service: Cardiovascular;  Laterality: N/A;   CHOLECYSTECTOMY  50 yrs ago   JOINT REPLACEMENT Right 2005   knee   WISDOM TOOTH EXTRACTION     Patient Active Problem List   Diagnosis Date Noted   Pseudophakia, both eyes 06/05/2022   Gastroesophageal reflux disease 05/02/2021   Chronic diastolic heart failure ([place]) 10/07/2020   Moderate nonproliferative diabetic retinopathy of both eyes ([place]) 05/25/2020   Chorioretinal scar 05/25/2020   Left epiretinal membrane 05/25/2020   Posterior vitreous detachment of both eyes 05/25/2020   Acute respiratory failure with hypoxia ([place]) 10/11/2019   CAP (community acquired pneumonia) [id number]   Diastolic dysfunction, left ventricle 04/05/2019   Coronary artery disease of native artery of native heart with stable angina pectoris ([place]) 11/23/2018   Hypercholesteremia 11/23/2018   Unstable angina ([place]) 10/12/2015   Abnormal myocardial perfusion study 10/12/2015   NSVT  (nonsustained ventricular tachycardia) ([place]) 10/11/2015   Primary hypertension 10/11/2015   CKD (chronic kidney disease), stage III ([place]) 10/11/2015   Atypical chest pain    Angina pectoris ([place]) 10/08/2015   Diabetes mellitus type II, non insulin dependent ([place]) 10/08/2015   AKI (acute kidney injury) ([place])    Abscess of left arm 01/01/2013   Migraine    Morbid obesity ([place])    Sleep apnea    Stenosis of left carotid artery    Reflux    Anemia     PCP: Seward Carol, MD  REFERRING PROVIDER: Alric Ran, MD   REFERRING DIAG: R42 (ICD-10-CM) - Dizziness   THERAPY DIAG:  Dizziness and giddiness  Unsteadiness on feet  Other abnormalities of gait and mobility  ONSET DATE: 08/08/2022  Rationale for Evaluation and Treatment: Rehabilitation  SUBJECTIVE:   SUBJECTIVE STATEMENT:  Has been trying the exercises at home. Feels like dizziness is getting better. Did not get much sleep last night. Saw the her PCP recently and nothing was discussed with her dizziness. Told her that she needs to walk more.   Pt accompanied by: self, family member, and daughter, [name]   PERTINENT HISTORY: PMH: Arthritis, Diabetes mellitus, HLD, HTN, Obesity, Migraine, CKD stage III, CAD, Glaucoma  PAIN:  Are you having pain? No  There were no vitals filed for this visit.   Sitting, Standing  PRECAUTIONS: Fall   PATIENT GOALS: Wants to get rid of the vertigo.     VESTIBULAR TREATMENT:     Therapeutic Activity: MOTION SENSITIVITY:            Motion Sensitivity Quotient Intensity: 0 = none, 1 = Lightheaded, 2 = Mild, 3 = Moderate, 4 = Severe, 5 = Vomiting   Intensity  1. Sitting to supine  0  2. Supine to L side  0  3. Supine to R side  0  4. Supine to sitting  1  5. L Hallpike-Dix    6. Up from L     7. R Hallpike-Dix    8. Up from R     9. Sitting, head tipped to L knee 0  10. Head up from L knee 0  11. Sitting, head tipped to R knee 0  12. Head up from R knee 0  13.  Sitting head turns x5 1  14.Sitting head nods x5 1  15. In stance, 180 turn to L     16. In stance, 180 turn to R                POSITIONAL TESTING: Right Roll Test: no nystagmus Left Roll Test: no nystagmus Right Sidelying: no nystagmus Left Sidelying: no nystagmus  Pt only with mild dizziness when coming from L sidelying > sit. Performed 3 reps for habituation, pt reporting improved dizziness with each rep. Added to HEP   Had discussion again with pt and pt's daughter throughout session that PT does not know the cause of pt's dizziness and that she should follow up with ENT  about pt's other medical issues (pt reporting popping in her ears and L ear pain).   Reviewed exercises for pt's HEP:  VOR cancellation x10 reps, pt with mild wooziness afterwards that subsided quickly Reviewed bending habituation with pt and pt's daughter, performed bending over and coming back upright and picking up 6 cones. Pt with no dizziness throughout. Discussed with seat head motions that when pt can perform 5 reps without getting dizzy, then can increase to performing 10 reps Importance of performing exercises daily for habituation.   PATIENT EDUCATION: Education details: Will D/C from PT at this time due to progress and pt subjectively reporting that dizziness has gotten better. Pt wants to take a break from PT at this time and focus on exercises at home. Discussed will close pt's chart and that if in the future, if dizziness is still present and not getting better, then pt will need to get a new referral to return to PT. Or if dizziness is getting better with pt continuing to do her exercises, then pt does not need to return to PT. Educated on also following up with ENT/neurologist regarding if dizziness is continuing. Reviewed finalized HEP  Person educated: Patient, Child(ren), and pt's daughter Education method: Explanation, Demonstration, Verbal cues, and Handouts Education comprehension: verbalized  understanding, returned demonstration, and needs further education  HOME EXERCISE PROGRAM: Rolling habituation Bending habituation   Access Code: [email or web address] URL: [email or web address] Date: 10/13/2022 Prepared by: Janann August  Exercises - Seated LEFT AND RIGHT Head Turns  - 2 x daily - 7 x weekly - 2 sets - 5 reps - Seated Head Nods Vestibular Habituation  - 2 x daily - 7 x weekly - 2 sets - 5 reps - Seated VOR Cancellation  - 2 x daily - 7 x weekly - 2 sets - 10 reps - Brandt-Daroff Vestibular Exercise  -  1 x daily - 7 x weekly - 1 sets - 3 reps   PHYSICAL THERAPY DISCHARGE SUMMARY  Visits from Start of Care: 6  Current functional level related to goals / functional outcomes: See LTGs/Clinical Assessment Statement   Remaining deficits: Dizziness, motion sensitivity, gait abnormalities, impaired balance, decr activity tolerance.   Education / Equipment: HEP   Patient agrees to discharge. Patient goals were met. Patient is being discharged due to being pleased with the current functional level.   GOALS: Goals reviewed with patient? Yes  SHORT TERM GOALS: ALL STGS = LTGS   LONG TERM GOALS: Target date: 09/13/2022   UPDATED GOAL DATE DUE TO 8 WEEK POC: 10/15/22  Pt will be independent with final HEP for vestibular/balance deficits in order to build upon functional gains made in therapy. Baseline: needs cues from pt's daughter due to cognitive deficits  Goal status: MET  2.  Pt will be negative for positional testing.  Baseline: pt with no dizziness/nystagmus  Goal status: MET  3.  mCTSIB to be assessed with goal written. Baseline: not assessed  Goal status: DEFERRED  4.  DVA to be assessed with goal written. Baseline: not assessed  Goal status:  DEFERRED  5.  Pt will score a 1/5 or less on all items on MSQ in order to demo improved motion sensitivity.  Baseline: 0-2/5  1/5 or less on 10/13/22 Goal status: MET   ASSESSMENT:  CLINICAL  IMPRESSION:  Today's skilled session focused on assessing pt's LTGs. Pt has met 3 out of 5 LTGs (remainder 2 of LTGs were deferred). Pt with improvements on MSQ, indicating improved motion sensitivities, esp with bed mobility, head motions, and bending. Pt reports she can now bend down while seated in the shower without dizziness. Pt was negative for positional testing and only had dizziness with return to upright from L sidelying position. Performed Nestor Lewandowsky for habituation with pt with improvement in symptoms. Added to pt's HEP. Educated on importance of continuing to perform exercises at home. Pt subjectively reports that her dizziness has gotten better since coming to therapy. Pt would like at this time to be D/Ced from therapy and to continue to work on exercises at home. Pt to also follow-up with ENT regarding popping in her ears and L ear pain. Discussed if dizziness still remains in the future, even after consistently performing exercises, then can get a new referral to return. Pt and pt's daughter verbalized understanding.  PT unsure of pt's cause of dizziness as it appears multi-factorial, but pt's dizziness has improved with habituation and VOR cancellation exercises.  Will D/C pt from PT at this time.      OBJECTIVE IMPAIRMENTS: Abnormal gait, decreased activity tolerance, decreased balance, decreased endurance, decreased mobility, difficulty walking, dizziness, and postural dysfunction.   ACTIVITY LIMITATIONS: lifting, bending, standing, squatting, transfers, bed mobility, locomotion level, and caring for others  PARTICIPATION LIMITATIONS: meal prep, cleaning, laundry, driving, and community activity  PERSONAL FACTORS: Age, Behavior pattern, Past/current experiences, Time since onset of injury/illness/exacerbation, and 3+ comorbidities: PMH: Arthritis, Diabetes mellitus, HLD, HTN, Obesity, Migraine, CKD stage III, CAD, Glaucoma  are also affecting patient's functional outcome.    REHAB POTENTIAL: Good  CLINICAL DECISION MAKING: Evolving/moderate complexity  EVALUATION COMPLEXITY: Moderate   PLAN:  PT FREQUENCY: 2x/week  PT DURATION: 8 weeks  PLANNED INTERVENTIONS: Therapeutic exercises, Therapeutic activity, Neuromuscular re-education, Balance training, Gait training, Patient/Family education, Self Care, Joint mobilization, Vestibular training, Canalith repositioning, Visual/preceptual remediation/compensation, DME instructions, and Re-evaluation  PLAN  FOR NEXT SESSION: D/C from PT    Arliss Journey, PT, DPT   10/13/2022, 1:25 PM

## 2022-10-16 ENCOUNTER — Telehealth: Payer: Self-pay | Admitting: Neurology

## 2022-10-16 ENCOUNTER — Ambulatory Visit: Payer: Medicare Other | Admitting: Cardiology

## 2022-10-16 NOTE — Telephone Encounter (Signed)
They should get the referral from their primary care physician

## 2022-10-16 NOTE — Telephone Encounter (Signed)
Pt's daughter states pt is in need of a referral from, Dr April Manson for pt to see ENT(for vertigo) Dr Reynolds Bowl @ [address] [place], [place]

## 2022-10-16 NOTE — Telephone Encounter (Signed)
Pt's daughter was called and informed. She verbalized appreciation and stated that she will be calling the PCP.

## 2022-10-26 NOTE — Progress Notes (Signed)
Follow up visit  Subjective:   Jeanne [name], female    DOB: 29-Apr-1936, 87 y.o.   MRN: [id number]     HPI   Chief Complaint  Patient presents with   Coronary Artery Disease   Follow-up    2 year    87 year old African-American female with nonobstructive CAD, hypertension, hyperlipidemia, type 2 DM, OSA,  IgG kappa monoclonal gammopathy   Patient is here with her daughter.  She denies any chest pain or shortness of breath.  She has burning pain in her feet at rest, but denies any claudication.  Physical activity is currently limited. Reviewed recent test results with the patient, details below.    Current Outpatient Medications:    allopurinol (ZYLOPRIM) 100 MG tablet, Take 100 mg by mouth daily., Disp: , Rfl:    aspirin EC 81 MG tablet, Take 1 tablet (81 mg total) by mouth daily. Swallow whole., Disp: 90 tablet, Rfl: 3   Cholecalciferol (VITAMIN D3) 50 MCG (2000 UT) TABS, Take 2,000 Units by mouth daily. , Disp: , Rfl:    dapagliflozin propanediol (FARXIGA) 10 MG TABS tablet, 1 tablet, Disp: , Rfl:    diltiazem (TIAZAC) 120 MG 24 hr capsule, Take 240 mg by mouth daily., Disp: , Rfl:    esomeprazole (NEXIUM) 20 MG capsule, TAKE 1 CAPSULE (20 MG TOTAL) BY MOUTH DAILY AT 12 NOON., Disp: 90 capsule, Rfl: 1   ezetimibe (ZETIA) 10 MG tablet, Take 10 mg by mouth daily., Disp: , Rfl:    ferrous sulfate 325 (65 FE) MG tablet, Take 325 mg by mouth daily with breakfast. , Disp: , Rfl:    furosemide (LASIX) 20 MG tablet, TAKE 1 TABLET BY MOUTH EVERY DAY, Disp: 90 tablet, Rfl: 1   hydrALAZINE (APRESOLINE) 25 MG tablet, TAKE 1 TABLET BY MOUTH THREE TIMES A DAY, Disp: 270 tablet, Rfl: 3   insulin glargine (LANTUS) 100 UNIT/ML injection, Inject 65 Units into the skin daily before breakfast., Disp: , Rfl:    isosorbide mononitrate (IMDUR) 30 MG 24 hr tablet, Take 1 tablet (30 mg total) by mouth daily., Disp: 90 tablet, Rfl: 3   latanoprost (XALATAN) 0.005 % ophthalmic solution, INSTILL 1  DROP IN LEFT EYE NIGHTLY, Disp: 7.5 mL, Rfl: 12   losartan (COZAAR) 25 MG tablet, TAKE 2 TABLETS BY MOUTH DAILY, Disp: 180 tablet, Rfl: 2   meclizine (ANTIVERT) 25 MG tablet, Take 1 tablet (25 mg total) by mouth 3 (three) times daily as needed for dizziness., Disp: 30 tablet, Rfl: 0   nitroGLYCERIN (NITROSTAT) 0.4 MG SL tablet, PLACE 1 TABLET UNDER THE TONGUE AS NEEDED FOR CHEST PAIN., Disp: 25 tablet, Rfl: 3   oxybutynin (DITROPAN-XL) 10 MG 24 hr tablet, Take 10 mg by mouth daily., Disp: , Rfl:    Polyvinyl Alcohol-Povidone (REFRESH OP), Place 1 drop into both eyes at bedtime., Disp: , Rfl:     Cardiovascular & other pertient studies:  EKG 10/27/2022: Sinus rhythm 78 bpm LAFB Occasional ectopic ventricular beat   Nonspecific T-abnormality  Echocardiogram 05/27/2021:  Poor echo window and foreshortened views due to patient body habitus.  Measurements may not be accurate.  Mildly depressed LV systolic function with EF 50%. Left ventricle cavity  is normal in size. Severe concentric hypertrophy of the left ventricle.  Normal global wall motion. Doppler evidence of grade II (pseudonormal)  diastolic dysfunction, elevated LAP. Calculated EF 50%.  Left atrial cavity is moderately dilated.  Right atrial cavity is mildly dilated.  Right ventricle cavity  is moderately dilated. Mildly reduced right  ventricular function.  Mild (Grade I) mitral regurgitation.  Moderate tricuspid regurgitation. Mild pulmonary hypertension. RVSP  measures 39 mmHg.  No significnat change since 06/18/2018.  CT Chest 06/09/2020: No evidence of parenchymal nodule to correspond with the findings of recent chest x-ray. The finding seen on recent chest x-ray are related to tortuosity of the vasculature as well exuberant degenerative changes in the thoracic spine and ribcage.   Small sliding-type hiatal hernia.   Mild thickening of the pericardium greater than that seen on the prior exam which may represent a  small effusion. The need for further follow-up can be determined on a clinical basis.   Aortic Atherosclerosis (ICD10-I70.0).  EKG 06/03/2020: Sinus rhythm 82 bpm  Left anterior fascicular block Nonspecific T-abnormality   Coronary angiogram 2017: Prox LAD to Mid LAD lesion, 50% stenosed. Ost 1st Diag to 1st Diag lesion, 70% stenosed. Ost 2nd Diag to 2nd Diag lesion, 75% stenosed. Dist Cx lesion, 40% stenosed. Prox RCA to Dist RCA lesion, 30% stenosed.   Mild to moderate obstructive coronary disease involving the mid LAD, the first and second diagonal branches, the distal circumflex, and diffusely throughout the mid RCA. No clinically significant obstruction is identified especially with reference to the perfusion abnormalities noted on radionuclide scintigraphy. Previously documented normal left ventricular function. Normal left ventricular hemodynamics were identified with an end-diastolic pressure of 13 mmHg.     Recommendations:   Consider alternative explanations for the patient's chest discomfort Disposition is per internal medicine.    10/2018: Normal examination. No evidence of hemodynamically significant peripheral arterial disease.   Signed,  Recent labs: 10/04/2022: Glucose 174, BUN/Cr 21/1.66. EGFR 30. Na/K 140/4.3. Rest of the CMP normal H/H 10/32. MCV 85. Platelets 257 HbA1C 6.1% Chol 210, TG 126, HDL 42, LDL 145  03/30/2020: Chol 92, TG 77, HDL 32, LDL 45   Review of Systems  Cardiovascular:  Negative for chest pain, dyspnea on exertion, leg swelling, palpitations and syncope.  Neurological:        Burning sensation in feet         Vitals:   10/27/22 0828 10/27/22 0857  BP: (!) 150/77 135/73  Pulse: 80 71  Resp: 16   SpO2: 94%       Body mass index is 56.7 kg/m. Filed Weights   10/27/22 0828  Weight: (!) 310 lb (140.6 kg)     Objective:   Physical Exam Vitals and nursing note reviewed.  Constitutional:      General: She is not in  acute distress.    Appearance: She is obese.  Neck:     Vascular: No JVD.  Cardiovascular:     Rate and Rhythm: Normal rate and regular rhythm.     Pulses:          Dorsalis pedis pulses are 1+ on the right side and 1+ on the left side.       Posterior tibial pulses are 1+ on the right side and 1+ on the left side.     Heart sounds: Normal heart sounds. No murmur heard. Pulmonary:     Effort: Pulmonary effort is normal.     Breath sounds: Normal breath sounds. No wheezing or rales.  Musculoskeletal:     Right lower leg: No edema.     Left lower leg: No edema.           Assessment & Recommendations:   87 year old African-American female with nonobstructive CAD, hypertension, hyperlipidemia, type 2  DM, OSA,  IgG kappa monoclonal gammopathy   Chest pain: No recent episodes. Given known CAD, continue aspirin, therapy. Mixed hyperlipidemia: She is currently tolerating Zetia.  It appears that she may have, on pravastatin that she was previously taking.  Reviewed recent results with the patient.  Recommend Lipitor 20 mg daily.  Repeat lipid panel in 3 months.  Hypertension: Controlled  Burning pain in feet likely due to diabetic neuropathy.  Recommend follow-up with PCP.  F/u in 3 months   Nigel Mormon, MD Pager: [phone] Office: [phone]

## 2022-10-27 ENCOUNTER — Ambulatory Visit: Payer: Medicare Other | Admitting: Cardiology

## 2022-10-27 ENCOUNTER — Encounter: Payer: Self-pay | Admitting: Cardiology

## 2022-10-27 VITALS — BP 135/73 | HR 71 | Resp 16 | Ht 62.0 in | Wt 310.0 lb

## 2022-10-27 DIAGNOSIS — I25118 Atherosclerotic heart disease of native coronary artery with other forms of angina pectoris: Secondary | ICD-10-CM

## 2022-10-27 DIAGNOSIS — I2510 Atherosclerotic heart disease of native coronary artery without angina pectoris: Secondary | ICD-10-CM

## 2022-10-27 DIAGNOSIS — E782 Mixed hyperlipidemia: Secondary | ICD-10-CM | POA: Insufficient documentation

## 2022-10-27 MED ORDER — ATORVASTATIN CALCIUM 20 MG PO TABS: 20.0000 mg | ORAL_TABLET | Freq: Every day | ORAL | 3 refills | Status: DC

## 2022-10-30 ENCOUNTER — Other Ambulatory Visit: Payer: Self-pay | Admitting: Neurology

## 2022-11-16 ENCOUNTER — Ambulatory Visit: Payer: Medicare Other | Admitting: Cardiology

## 2022-11-16 ENCOUNTER — Other Ambulatory Visit: Payer: Self-pay | Admitting: Cardiology

## 2022-11-16 DIAGNOSIS — I25118 Atherosclerotic heart disease of native coronary artery with other forms of angina pectoris: Secondary | ICD-10-CM

## 2022-11-24 ENCOUNTER — Emergency Department (HOSPITAL_COMMUNITY): Payer: Medicare Other

## 2022-11-24 ENCOUNTER — Other Ambulatory Visit: Payer: Self-pay

## 2022-11-24 ENCOUNTER — Encounter (HOSPITAL_COMMUNITY): Payer: Self-pay | Admitting: Emergency Medicine

## 2022-11-24 ENCOUNTER — Emergency Department (HOSPITAL_COMMUNITY)
Admission: EM | Admit: 2022-11-24 | Discharge: 2022-11-24 | Disposition: A | Discharge status: Home / Self Care | Payer: Medicare Other | Attending: Emergency Medicine | Admitting: Emergency Medicine

## 2022-11-24 DIAGNOSIS — D649 Anemia, unspecified: Secondary | ICD-10-CM | POA: Diagnosis not present

## 2022-11-24 DIAGNOSIS — E119 Type 2 diabetes mellitus without complications: Secondary | ICD-10-CM | POA: Diagnosis not present

## 2022-11-24 DIAGNOSIS — R0789 Other chest pain: Secondary | ICD-10-CM | POA: Diagnosis present

## 2022-11-24 DIAGNOSIS — I25119 Atherosclerotic heart disease of native coronary artery with unspecified angina pectoris: Secondary | ICD-10-CM | POA: Insufficient documentation

## 2022-11-24 DIAGNOSIS — Z7982 Long term (current) use of aspirin: Secondary | ICD-10-CM | POA: Insufficient documentation

## 2022-11-24 DIAGNOSIS — I10 Essential (primary) hypertension: Secondary | ICD-10-CM | POA: Insufficient documentation

## 2022-11-24 DIAGNOSIS — Z794 Long term (current) use of insulin: Secondary | ICD-10-CM | POA: Diagnosis not present

## 2022-11-24 DIAGNOSIS — I25118 Atherosclerotic heart disease of native coronary artery with other forms of angina pectoris: Secondary | ICD-10-CM

## 2022-11-24 LAB — CBC WITH DIFFERENTIAL/PLATELET
Abs Immature Granulocytes: 0.01 10*3/uL (ref 0.00–0.07)
Basophils Absolute: 0 10*3/uL (ref 0.0–0.1)
Basophils Relative: 1 %
Eosinophils Absolute: 0.1 10*3/uL (ref 0.0–0.5)
Eosinophils Relative: 3 %
HCT: 31.3 % — ABNORMAL LOW (ref 36.0–46.0)
Hemoglobin: 9.8 g/dL — ABNORMAL LOW (ref 12.0–15.0)
Immature Granulocytes: 0 %
Lymphocytes Relative: 22 %
Lymphs Abs: 0.9 10*3/uL (ref 0.7–4.0)
MCH: 27.3 pg (ref 26.0–34.0)
MCHC: 31.3 g/dL (ref 30.0–36.0)
MCV: 87.2 fL (ref 80.0–100.0)
Monocytes Absolute: 0.5 10*3/uL (ref 0.1–1.0)
Monocytes Relative: 11 %
Neutro Abs: 2.6 10*3/uL (ref 1.7–7.7)
Neutrophils Relative %: 63 %
Platelets: 232 10*3/uL (ref 150–400)
RBC: 3.59 MIL/uL — ABNORMAL LOW (ref 3.87–5.11)
RDW: 14.5 % (ref 11.5–15.5)
WBC: 4.1 10*3/uL (ref 4.0–10.5)
nRBC: 0 % (ref 0.0–0.2)

## 2022-11-24 LAB — TROPONIN I (HIGH SENSITIVITY)
Troponin I (High Sensitivity): 13 ng/L (ref <18)
Troponin I (High Sensitivity): 15 ng/L (ref <18)

## 2022-11-24 LAB — COMPREHENSIVE METABOLIC PANEL
ALT: 8 U/L (ref 0–44)
AST: 15 U/L (ref 15–41)
Albumin: 2.8 g/dL — ABNORMAL LOW (ref 3.5–5.0)
Alkaline Phosphatase: 68 U/L (ref 38–126)
Anion gap: 11 (ref 5–15)
BUN: 15 mg/dL (ref 8–23)
CO2: 27 mmol/L (ref 22–32)
Calcium: 8.7 mg/dL — ABNORMAL LOW (ref 8.9–10.3)
Chloride: 100 mmol/L (ref 98–111)
Creatinine, Ser: 1.64 mg/dL — ABNORMAL HIGH (ref 0.44–1.00)
GFR, Estimated: 31 mL/min — ABNORMAL LOW (ref >60)
Glucose, Bld: 87 mg/dL (ref 70–99)
Potassium: 3.6 mmol/L (ref 3.5–5.1)
Sodium: 138 mmol/L (ref 135–145)
Total Bilirubin: 0.2 mg/dL — ABNORMAL LOW (ref 0.3–1.2)
Total Protein: 6.3 g/dL — ABNORMAL LOW (ref 6.5–8.1)

## 2022-11-24 LAB — LIPASE, BLOOD: Lipase: 28 U/L (ref 11–51)

## 2022-11-24 LAB — POC OCCULT BLOOD, ED: Fecal Occult Bld: NEGATIVE

## 2022-11-24 MED ORDER — ISOSORBIDE MONONITRATE ER 60 MG PO TB24: 60.0000 mg | ORAL_TABLET | Freq: Every day | ORAL | 0 refills | Status: DC

## 2022-11-24 MED ORDER — NITROGLYCERIN 0.4 MG SL SUBL
0.4000 mg | SUBLINGUAL_TABLET | SUBLINGUAL | Status: DC | PRN
  Administered 2022-11-24: 0.4 mg via SUBLINGUAL
  Filled 2022-11-24: qty 1

## 2022-11-24 MED ORDER — ASPIRIN 81 MG PO CHEW
324.0000 mg | CHEWABLE_TABLET | Freq: Once | ORAL | Status: AC
  Administered 2022-11-24: 324 mg via ORAL
  Filled 2022-11-24: qty 4

## 2022-11-24 NOTE — Discharge Instructions (Signed)
Increase your isosorbide mononitrate (Imdur) from 30 mg (1 tablet) to 2 tablets/day.  I have also written you a new prescription but you can double up until you run out of your current prescription.  Dr. Virgina Jock office will call you for outpatient follow-up.  If you develop recurrent, continued, or worsening chest pain, shortness of breath, fever, vomiting, abdominal or back pain, or any other new/concerning symptoms then return to the ER for evaluation.

## 2022-11-24 NOTE — ED Provider Notes (Signed)
[facility] Provider Note   CSN: [id number] Arrival date & time: 11/24/22  [id number]     History  Chief Complaint  Patient presents with   Chest Pain    Jeanne [name] is a 87 y.o. female.  HPI 87 year old female with a history of CAD, diabetes, hypertension, hyperlipidemia and obesity presents with chest pain.  She states she has had this type of chest pain before and been to the ER several times.  She states that around 6 AM when she was rolling over in bed she developed sharp chest pains.  It was diffuse across her chest.  No shortness of breath, diaphoresis, nausea, vomiting or back or abdominal pain.  She took 2 nitroglycerin which did seem to help.  The pain is now pretty mild. No radiation of the pain.  Home Medications Prior to Admission medications   Medication Sig Start Date End Date Taking? Authorizing Provider  allopurinol (ZYLOPRIM) 100 MG tablet Take 100 mg by mouth daily. 05/27/20   [provider]  aspirin EC ([facility] ASPIRIN LOW DOSE) 81 MG tablet TAKE 1 TABLET (81 MG TOTAL) BY MOUTH DAILY. SWALLOW WHOLE. 11/16/22   Patwardhan, Reynold Bowen, MD  atorvastatin (LIPITOR) 20 MG tablet Take 1 tablet (20 mg total) by mouth daily. 10/27/22 10/22/23  Patwardhan, Reynold Bowen, MD  Cholecalciferol (VITAMIN D3) 50 MCG (2000 UT) TABS Take 2,000 Units by mouth daily.  07/30/19   [provider]  dapagliflozin propanediol (FARXIGA) 10 MG TABS tablet 1 tablet 07/08/21   [provider]  diltiazem (TIAZAC) 120 MG 24 hr capsule Take 240 mg by mouth daily. 09/08/19   [provider]  escitalopram (LEXAPRO) 10 MG tablet Take 10 mg by mouth daily. 08/21/22   [provider]  esomeprazole (NEXIUM) 20 MG capsule TAKE 1 CAPSULE (20 MG TOTAL) BY MOUTH DAILY AT 12 NOON. 05/18/22   Patwardhan, Reynold Bowen, MD  ezetimibe (ZETIA) 10 MG tablet Take 10 mg by mouth daily.    [provider]  ferrous sulfate 325 (65 FE) MG  tablet Take 325 mg by mouth daily with breakfast.  07/30/19   [provider]  furosemide (LASIX) 20 MG tablet TAKE 1 TABLET BY MOUTH EVERY DAY 09/18/22   Patwardhan, Manish J, MD  hydrALAZINE (APRESOLINE) 25 MG tablet TAKE 1 TABLET BY MOUTH THREE TIMES A DAY 03/27/22   Patwardhan, Manish J, MD  insulin glargine (LANTUS) 100 UNIT/ML injection Inject 65 Units into the skin daily before breakfast.    [provider]  isosorbide mononitrate (IMDUR) 60 MG 24 hr tablet Take 1 tablet (60 mg total) by mouth daily. 11/24/22 12/24/22  Sherwood Gambler, MD  latanoprost (XALATAN) 0.005 % ophthalmic solution INSTILL 1 DROP IN LEFT EYE NIGHTLY 12/05/21   Rankin, Clent Demark, MD  losartan (COZAAR) 25 MG tablet TAKE 2 TABLETS BY MOUTH DAILY 11/16/22   Patwardhan, Reynold Bowen, MD  meclizine (ANTIVERT) 25 MG tablet TAKE 1 TABLET BY MOUTH 3 TIMES DAILY AS NEEDED FOR DIZZINESS. 11/01/22   Alric Ran, MD  nitroGLYCERIN (NITROSTAT) 0.4 MG SL tablet PLACE 1 TABLET UNDER THE TONGUE AS NEEDED FOR CHEST PAIN. 08/21/22   Patwardhan, Reynold Bowen, MD  oxybutynin (DITROPAN-XL) 10 MG 24 hr tablet Take 10 mg by mouth daily.    [provider]  Polyvinyl Alcohol-Povidone (REFRESH OP) Place 1 drop into both eyes at bedtime.    [provider]      Allergies  Amoxicillin, Enalapril maleate, Invokana [canagliflozin], Liraglutide, Pioglitazone, and Tramadol hcl    Review of Systems   Review of Systems  Constitutional:  Negative for diaphoresis and fever.  Respiratory:  Negative for shortness of breath.   Cardiovascular:  Positive for chest pain.  Gastrointestinal:  Negative for abdominal pain.  Musculoskeletal:  Negative for back pain.    Physical Exam Updated Vital Signs BP (!) 156/70 (BP Location: Left Arm)   Pulse 75   Temp 98 F (36.7 C) (Oral)   Resp 14   SpO2 96%  Physical Exam Vitals and nursing note reviewed.  Constitutional:      Appearance: She is well-developed. She is obese.  HENT:      Head: Normocephalic and atraumatic.  Cardiovascular:     Rate and Rhythm: Normal rate and regular rhythm.     Heart sounds: Normal heart sounds.  Pulmonary:     Effort: Pulmonary effort is normal.     Breath sounds: Normal breath sounds.  Abdominal:     Palpations: Abdomen is soft.     Tenderness: There is no abdominal tenderness.  Skin:    General: Skin is warm and dry.  Neurological:     Mental Status: She is alert.     ED Results / Procedures / Treatments   Labs (all labs ordered are listed, but only abnormal results are displayed) Labs Reviewed  COMPREHENSIVE METABOLIC PANEL - Abnormal; Notable for the following components:      Result Value   Creatinine, Ser 1.64 (*)    Calcium 8.7 (*)    Total Protein 6.3 (*)    Albumin 2.8 (*)    Total Bilirubin 0.2 (*)    GFR, Estimated 31 (*)    All other components within normal limits  CBC WITH DIFFERENTIAL/PLATELET - Abnormal; Notable for the following components:   RBC 3.59 (*)    Hemoglobin 9.8 (*)    HCT 31.3 (*)    All other components within normal limits  LIPASE, BLOOD  POC OCCULT BLOOD, ED  TROPONIN I (HIGH SENSITIVITY)  TROPONIN I (HIGH SENSITIVITY)    EKG EKG Interpretation  Date/Time:  Friday November 24 2022 12:40:49 EDT Ventricular Rate:  81 PR Interval:  175 QRS Duration: 101 QT Interval:  393 QTC Calculation: 457 R Axis:   -76 Text Interpretation: Sinus rhythm Atrial premature complex LAD, consider left anterior fascicular block Abnormal R-wave progression, late transition no significant change since earlier in the day Confirmed by Sherwood Gambler [id number]) on 11/24/2022 1:06:25 PM  Radiology DG Chest 2 View  Result Date: 11/24/2022 CLINICAL DATA:  Chest pain. EXAM: CHEST - 2 VIEW COMPARISON:  05/11/2022. FINDINGS: Unchanged mild central pulmonary venous congestion and cardiomegaly. No focal airspace opacity. Stable mediastinal contours with atherosclerotic calcifications and tortuosity of the aorta. No  pleural effusion or pneumothorax. Visualized bones and upper abdomen are unremarkable. IMPRESSION: 1. Unchanged mild central pulmonary venous congestion and cardiomegaly. No focal airspace opacity. 2.  Aortic Atherosclerosis (ICD10-I70.0). Electronically Signed   By: Emmit Alexanders M.D.   On: 11/24/2022 09:46    Procedures Procedures    Medications Ordered in ED Medications  nitroGLYCERIN (NITROSTAT) SL tablet 0.4 mg (0.4 mg Sublingual Given 11/24/22 1244)  aspirin chewable tablet 324 mg (324 mg Oral Given 11/24/22 [id number])    ED Course/ Medical Decision Making/ A&P  Medical Decision Making Amount and/or Complexity of Data Reviewed Labs: ordered.    Details: Troponins normal x 2.  No AKI.  She has a slightly worse anemia than baseline though this is last compared to about 6 months ago.  Occult blood is negative. Radiology: ordered and independent interpretation performed.    Details: No overt CHF  Risk OTC drugs. Prescription drug management.   Patient presents with atypical chest pain described as sharp in nature with no radiation.  Did seem to get helped by nitro.  She does have a known history of CAD with her most recent cath being about 8 years ago.  Troponins are negative.  She did feel better with nitro.  She was given aspirin.  Hemoglobin is mildly worse than baseline but no signs of active bleeding and is unclear the chronicity of this.  I discussed her case with Dr. Virgina Jock, who will arrange outpatient follow-up next week.  He advises to go from 30 mg Imdur to 60 mg a day.  Will provide her a new prescription.  Will give return precautions.  Low suspicion for PE or dissection.  I do not think this is unstable angina.        Final Clinical Impression(s) / ED Diagnoses Final diagnoses:  Atherosclerosis of native coronary artery of native heart with stable angina pectoris ([place])    Rx / DC Orders ED Discharge Orders          Ordered     isosorbide mononitrate (IMDUR) 60 MG 24 hr tablet  Daily        11/24/22 1544              Sherwood Gambler, MD 11/24/22 1554

## 2022-11-24 NOTE — ED Triage Notes (Signed)
Pt woke up this morning with small pains all in her chest. Denies it being sharp. States she has had this many times. Has cardiologist. Endorses SOB earlier this morning that improved by "getting up and moving around". Denies diaphoresis and nausea. PT took nitro x 2 and it did help her pain.

## 2022-12-05 ENCOUNTER — Encounter (INDEPENDENT_AMBULATORY_CARE_PROVIDER_SITE_OTHER): Payer: Medicare Other | Admitting: Ophthalmology

## 2022-12-06 ENCOUNTER — Encounter (HOSPITAL_COMMUNITY): Payer: Self-pay

## 2022-12-06 ENCOUNTER — Other Ambulatory Visit: Payer: Self-pay

## 2022-12-06 ENCOUNTER — Emergency Department (HOSPITAL_COMMUNITY): Payer: Medicare Other

## 2022-12-06 ENCOUNTER — Observation Stay (HOSPITAL_COMMUNITY)
Admission: EM | Admit: 2022-12-06 | Discharge: 2022-12-08 | Disposition: A | Discharge status: Home / Self Care | Payer: Medicare Other | Attending: Family Medicine | Admitting: Family Medicine

## 2022-12-06 ENCOUNTER — Telehealth: Payer: Self-pay

## 2022-12-06 DIAGNOSIS — Z96651 Presence of right artificial knee joint: Secondary | ICD-10-CM | POA: Insufficient documentation

## 2022-12-06 DIAGNOSIS — I200 Unstable angina: Secondary | ICD-10-CM

## 2022-12-06 DIAGNOSIS — E782 Mixed hyperlipidemia: Secondary | ICD-10-CM | POA: Diagnosis present

## 2022-12-06 DIAGNOSIS — Z79899 Other long term (current) drug therapy: Secondary | ICD-10-CM | POA: Insufficient documentation

## 2022-12-06 DIAGNOSIS — N1832 Chronic kidney disease, stage 3b: Secondary | ICD-10-CM | POA: Diagnosis not present

## 2022-12-06 DIAGNOSIS — E1122 Type 2 diabetes mellitus with diabetic chronic kidney disease: Secondary | ICD-10-CM | POA: Insufficient documentation

## 2022-12-06 DIAGNOSIS — K219 Gastro-esophageal reflux disease without esophagitis: Secondary | ICD-10-CM | POA: Diagnosis present

## 2022-12-06 DIAGNOSIS — I130 Hypertensive heart and chronic kidney disease with heart failure and stage 1 through stage 4 chronic kidney disease, or unspecified chronic kidney disease: Secondary | ICD-10-CM | POA: Diagnosis not present

## 2022-12-06 DIAGNOSIS — D631 Anemia in chronic kidney disease: Secondary | ICD-10-CM | POA: Diagnosis not present

## 2022-12-06 DIAGNOSIS — I10 Essential (primary) hypertension: Secondary | ICD-10-CM | POA: Diagnosis present

## 2022-12-06 DIAGNOSIS — Z7982 Long term (current) use of aspirin: Secondary | ICD-10-CM | POA: Insufficient documentation

## 2022-12-06 DIAGNOSIS — N184 Chronic kidney disease, stage 4 (severe): Secondary | ICD-10-CM | POA: Diagnosis not present

## 2022-12-06 DIAGNOSIS — Z8616 Personal history of COVID-19: Secondary | ICD-10-CM | POA: Insufficient documentation

## 2022-12-06 DIAGNOSIS — I5032 Chronic diastolic (congestive) heart failure: Secondary | ICD-10-CM | POA: Diagnosis present

## 2022-12-06 DIAGNOSIS — I2510 Atherosclerotic heart disease of native coronary artery without angina pectoris: Secondary | ICD-10-CM | POA: Insufficient documentation

## 2022-12-06 DIAGNOSIS — N183 Chronic kidney disease, stage 3 unspecified: Secondary | ICD-10-CM | POA: Diagnosis present

## 2022-12-06 DIAGNOSIS — G4733 Obstructive sleep apnea (adult) (pediatric): Secondary | ICD-10-CM | POA: Diagnosis present

## 2022-12-06 DIAGNOSIS — R079 Chest pain, unspecified: Secondary | ICD-10-CM | POA: Diagnosis not present

## 2022-12-06 DIAGNOSIS — Z794 Long term (current) use of insulin: Secondary | ICD-10-CM

## 2022-12-06 DIAGNOSIS — D638 Anemia in other chronic diseases classified elsewhere: Secondary | ICD-10-CM | POA: Diagnosis present

## 2022-12-06 DIAGNOSIS — E119 Type 2 diabetes mellitus without complications: Secondary | ICD-10-CM

## 2022-12-06 DIAGNOSIS — E785 Hyperlipidemia, unspecified: Secondary | ICD-10-CM | POA: Diagnosis present

## 2022-12-06 LAB — BASIC METABOLIC PANEL
Anion gap: 12 (ref 5–15)
BUN: 12 mg/dL (ref 8–23)
CO2: 27 mmol/L (ref 22–32)
Calcium: 9.2 mg/dL (ref 8.9–10.3)
Chloride: 100 mmol/L (ref 98–111)
Creatinine, Ser: 1.56 mg/dL — ABNORMAL HIGH (ref 0.44–1.00)
GFR, Estimated: 33 mL/min — ABNORMAL LOW (ref >60)
Glucose, Bld: 91 mg/dL (ref 70–99)
Potassium: 3.7 mmol/L (ref 3.5–5.1)
Sodium: 139 mmol/L (ref 135–145)

## 2022-12-06 LAB — BRAIN NATRIURETIC PEPTIDE: B Natriuretic Peptide: 306.5 pg/mL — ABNORMAL HIGH (ref 0.0–100.0)

## 2022-12-06 LAB — CBC
HCT: 33.3 % — ABNORMAL LOW (ref 36.0–46.0)
Hemoglobin: 10 g/dL — ABNORMAL LOW (ref 12.0–15.0)
MCH: 26.5 pg (ref 26.0–34.0)
MCHC: 30 g/dL (ref 30.0–36.0)
MCV: 88.1 fL (ref 80.0–100.0)
Platelets: 270 10*3/uL (ref 150–400)
RBC: 3.78 MIL/uL — ABNORMAL LOW (ref 3.87–5.11)
RDW: 14.6 % (ref 11.5–15.5)
WBC: 6.3 10*3/uL (ref 4.0–10.5)
nRBC: 0 % (ref 0.0–0.2)

## 2022-12-06 LAB — HEPATIC FUNCTION PANEL
ALT: 9 U/L (ref 0–44)
AST: 15 U/L (ref 15–41)
Albumin: 2.9 g/dL — ABNORMAL LOW (ref 3.5–5.0)
Alkaline Phosphatase: 74 U/L (ref 38–126)
Bilirubin, Direct: <0.1 mg/dL (ref 0.0–0.2)
Total Bilirubin: 0.5 mg/dL (ref 0.3–1.2)
Total Protein: 6.6 g/dL (ref 6.5–8.1)

## 2022-12-06 LAB — TROPONIN I (HIGH SENSITIVITY)
Troponin I (High Sensitivity): 16 ng/L (ref <18)
Troponin I (High Sensitivity): 16 ng/L (ref <18)

## 2022-12-06 MED ORDER — ACETAMINOPHEN 650 MG RE SUPP: 650.0000 mg | Freq: Four times a day (QID) | RECTAL | Status: DC | PRN

## 2022-12-06 MED ORDER — ASPIRIN 81 MG PO TBEC
81.0000 mg | DELAYED_RELEASE_TABLET | Freq: Every day | ORAL | Status: DC
  Administered 2022-12-07 – 2022-12-08 (×2): 81 mg via ORAL
  Filled 2022-12-06 (×2): qty 1

## 2022-12-06 MED ORDER — MELATONIN 3 MG PO TABS: 3.0000 mg | ORAL_TABLET | Freq: Every evening | ORAL | Status: DC | PRN

## 2022-12-06 MED ORDER — POTASSIUM CHLORIDE CRYS ER 20 MEQ PO TBCR
40.0000 meq | EXTENDED_RELEASE_TABLET | Freq: Once | ORAL | Status: AC
  Administered 2022-12-07: 40 meq via ORAL
  Filled 2022-12-06: qty 2

## 2022-12-06 MED ORDER — FUROSEMIDE 20 MG PO TABS
20.0000 mg | ORAL_TABLET | Freq: Every day | ORAL | Status: DC
  Administered 2022-12-07 – 2022-12-08 (×2): 20 mg via ORAL
  Filled 2022-12-06 (×2): qty 1

## 2022-12-06 MED ORDER — HYDRALAZINE HCL 25 MG PO TABS
25.0000 mg | ORAL_TABLET | Freq: Three times a day (TID) | ORAL | Status: DC
  Administered 2022-12-07 – 2022-12-08 (×3): 25 mg via ORAL
  Filled 2022-12-06 (×3): qty 1

## 2022-12-06 MED ORDER — LOSARTAN POTASSIUM 50 MG PO TABS: 50.0000 mg | ORAL_TABLET | Freq: Every day | ORAL | Status: DC

## 2022-12-06 MED ORDER — PANTOPRAZOLE SODIUM 40 MG PO TBEC
40.0000 mg | DELAYED_RELEASE_TABLET | Freq: Every day | ORAL | Status: DC
  Administered 2022-12-07 – 2022-12-08 (×2): 40 mg via ORAL
  Filled 2022-12-06 (×2): qty 1

## 2022-12-06 MED ORDER — LATANOPROST 0.005 % OP SOLN
1.0000 [drp] | Freq: Every day | OPHTHALMIC | Status: DC
  Filled 2022-12-06: qty 2.5

## 2022-12-06 MED ORDER — INSULIN GLARGINE-YFGN 100 UNIT/ML ~~LOC~~ SOLN
25.0000 [IU] | Freq: Every morning | SUBCUTANEOUS | Status: DC
  Administered 2022-12-08: 25 [IU] via SUBCUTANEOUS
  Filled 2022-12-06 (×2): qty 0.25

## 2022-12-06 MED ORDER — ASPIRIN 325 MG PO TABS
325.0000 mg | ORAL_TABLET | Freq: Once | ORAL | Status: AC
  Administered 2022-12-07: 325 mg via ORAL
  Filled 2022-12-06: qty 1

## 2022-12-06 MED ORDER — ONDANSETRON HCL 4 MG/2ML IJ SOLN: 4.0000 mg | Freq: Four times a day (QID) | INTRAMUSCULAR | Status: DC | PRN

## 2022-12-06 MED ORDER — NITROGLYCERIN 2 % TD OINT
0.5000 [in_us] | TOPICAL_OINTMENT | Freq: Once | TRANSDERMAL | Status: AC
  Administered 2022-12-06: 0.5 [in_us] via TOPICAL
  Filled 2022-12-06: qty 1

## 2022-12-06 MED ORDER — ATORVASTATIN CALCIUM 10 MG PO TABS
20.0000 mg | ORAL_TABLET | Freq: Every day | ORAL | Status: DC
  Administered 2022-12-07 – 2022-12-08 (×2): 20 mg via ORAL
  Filled 2022-12-06 (×2): qty 2

## 2022-12-06 MED ORDER — INSULIN ASPART 100 UNIT/ML IJ SOLN
0.0000 [IU] | Freq: Three times a day (TID) | INTRAMUSCULAR | Status: DC
  Administered 2022-12-08: 1 [IU] via SUBCUTANEOUS

## 2022-12-06 MED ORDER — ESCITALOPRAM OXALATE 10 MG PO TABS
10.0000 mg | ORAL_TABLET | Freq: Every day | ORAL | Status: DC
  Administered 2022-12-07 – 2022-12-08 (×2): 10 mg via ORAL
  Filled 2022-12-06 (×2): qty 1

## 2022-12-06 MED ORDER — EZETIMIBE 10 MG PO TABS
10.0000 mg | ORAL_TABLET | Freq: Every day | ORAL | Status: DC
  Administered 2022-12-08: 10 mg via ORAL
  Filled 2022-12-06: qty 1

## 2022-12-06 MED ORDER — FERROUS SULFATE 325 (65 FE) MG PO TABS
325.0000 mg | ORAL_TABLET | Freq: Every day | ORAL | Status: DC
  Administered 2022-12-08: 325 mg via ORAL
  Filled 2022-12-06 (×2): qty 1

## 2022-12-06 MED ORDER — ACETAMINOPHEN 325 MG PO TABS: 650.0000 mg | ORAL_TABLET | Freq: Four times a day (QID) | ORAL | Status: DC | PRN

## 2022-12-06 NOTE — Consult Note (Signed)
Cardiology Consultation   Patient ID: Jeanne [name] MRN: [id number]; DOB: 1935/11/06  Admit date: 12/06/2022 Date of Consult: 12/06/2022  PCP:  Seward Carol, MD   [facility] Providers Cardiologist:  None   { Click here to update MD or APP on Care Team, Refresh:1}     Patient Profile:   Jeanne [name] is a 87 y.o. female with a hx of *** who is being seen 12/06/2022 for the evaluation of *** at the request of ***.  History of Present Illness:   Ms. Sprenkle ***   Past Medical History:  Diagnosis Date   Anemia    Arthritis    Carotid arterial disease    COVID-19    Diabetes mellitus    Hyperlipidemia    Hypertension    Migraine    Obesity    Reflux    Sleep apnea     Past Surgical History:  Procedure Laterality Date   CARDIAC CATHETERIZATION N/A 10/12/2015   Procedure: Left Heart Cath and Coronary Angiography;  Surgeon: Belva Crome, MD;  Location: [facility];  Service: Cardiovascular;  Laterality: N/A;   CHOLECYSTECTOMY  50 yrs ago   JOINT REPLACEMENT Right 2005   knee   WISDOM TOOTH EXTRACTION       {Home Medications (Optional):21181}  Inpatient Medications: Scheduled Meds:  Continuous Infusions:  PRN Meds:   Allergies:    Allergies  Allergen Reactions   Amoxicillin Nausea Only    Has patient had a PCN reaction causing immediate rash, facial/tongue/throat swelling, SOB or lightheadedness with hypotension: "yes, i was dizzy" Has patient had a PCN reaction causing severe rash involving mucus membranes or skin necrosis: no Did a PCN reaction that required hospitalization : no, called the DR. Did PCN reaction occurring within the last 10 years: unk if all of the above answers are "NO", then may proceed with Cephalosporin use.    Enalapril Maleate     Other reaction(s): angioedema   Invokana [Canagliflozin] Nausea Only and Other (See Comments)    Dizziness    Liraglutide     Other reaction(s): Nausea and dizziness    Pioglitazone     Other reaction(s): macular edema   Tramadol Hcl     Other reaction(s): nausea, headache and dizziness    Social History:   Social History   Socioeconomic History   Marital status: Widowed    Spouse name: Not on file   Number of children: 2   Years of education: Not on file   Highest education level: Not on file  Occupational History   Not on file  Tobacco Use   Smoking status: Never   Smokeless tobacco: Never  Vaping Use   Vaping Use: Never used  Substance and Sexual Activity   Alcohol use: Never   Drug use: No   Sexual activity: Not on file  Other Topics Concern   Not on file  Social History Narrative   Not on file   Social Determinants of Health   Financial Resource Strain: Not on file  Food Insecurity: Not on file  Transportation Needs: Not on file  Physical Activity: Not on file  Stress: Not on file  Social Connections: Not on file  Intimate Partner Violence: Not on file    Family History:   *** Family History  Problem Relation Age of Onset   Heart attack Father    Heart attack Mother      ROS:  Please see the history of present  illness.  *** All other ROS reviewed and negative.     Physical Exam/Data:   Vitals:   12/06/22 2119 12/06/22 2120 12/06/22 2122 12/06/22 2123  BP:      Pulse: 78 72 71 73  Resp: 17 20 18 16   Temp:      SpO2: 96% 98% 97% 100%  Weight:      Height:       No intake or output data in the 24 hours ending 12/06/22 2203    12/06/2022    5:28 PM 10/27/2022    8:28 AM 06/21/2022    1:28 PM  Last 3 Weights  Weight (lbs) 302 lb 310 lb 311 lb  Weight (kg) 136.986 kg 140.615 kg 141.069 kg     Body mass index is 54.36 kg/m.  General:  Well nourished, well developed, in no acute distress*** HEENT: normal Neck: no JVD Vascular: No carotid bruits; Distal pulses 2+ bilaterally Cardiac:  normal S1, S2; RRR; no murmur *** Lungs:  clear to auscultation bilaterally, no wheezing, rhonchi or rales  Abd: soft,  nontender, no hepatomegaly  Ext: no edema Musculoskeletal:  No deformities, BUE and BLE strength normal and equal Skin: warm and dry  Neuro:  CNs 2-12 intact, no focal abnormalities noted Psych:  Normal affect   EKG:  The EKG was personally reviewed and demonstrates:  *** Telemetry:  Telemetry was personally reviewed and demonstrates:  ***  Relevant CV Studies: ***  Laboratory Data:  High Sensitivity Troponin:   Recent Labs  Lab 11/24/22 0911 11/24/22 1229 12/06/22 1724 12/06/22 1942  TROPONINIHS 13 15 16 16      Chemistry Recent Labs  Lab 12/06/22 1724  NA 139  K 3.7  CL 100  CO2 27  GLUCOSE 91  BUN 12  CREATININE 1.56*  CALCIUM 9.2  GFRNONAA 33*  ANIONGAP 12    Recent Labs  Lab 12/06/22 1807  PROT 6.6  ALBUMIN 2.9*  AST 15  ALT 9  ALKPHOS 74  BILITOT 0.5   Lipids No results for input(s): "CHOL", "TRIG", "HDL", "LABVLDL", "LDLCALC", "CHOLHDL" in the last 168 hours.  Hematology Recent Labs  Lab 12/06/22 1724  WBC 6.3  RBC 3.78*  HGB 10.0*  HCT 33.3*  MCV 88.1  MCH 26.5  MCHC 30.0  RDW 14.6  PLT 270   Thyroid No results for input(s): "TSH", "FREET4" in the last 168 hours.  BNP Recent Labs  Lab 12/06/22 1830  BNP 306.5*    DDimer No results for input(s): "DDIMER" in the last 168 hours.   Radiology/Studies:  DG Chest 2 View  Result Date: 12/06/2022 CLINICAL DATA:  Chest pain EXAM: CHEST - 2 VIEW COMPARISON:  X-ray 11/24/2022 and older FINDINGS: Enlarged cardiopericardial silhouette with some vascular congestion. No consolidation, pneumothorax or effusion. No edema. Films are under penetrated. Degenerative changes of the spine. Motion on lateral view. IMPRESSION: Enlarged cardiopericardial silhouette with vascular congestion. Electronically Signed   By: Jill Side M.D.   On: 12/06/2022 17:50     Assessment and Plan:   ***   Risk Assessment/Risk Scores:  {Complete the following score calculators/questions to meet required metrics.   Press F2         :[id number]   {Is the patient being seen for unstable angina, ACS, NSTEMI or STEMI?:[phone]} {Does this patient have CHF or CHF symptoms?      :[id number] {Does this patient have ATRIAL FIBRILLATION?[phone]}  {Are we signing off today?:[id number]}  For questions or updates, please contact [facility] Please consult [email or web address] for contact info under    Signed, Hershal Coria, MD  12/06/2022 10:03 PM

## 2022-12-06 NOTE — Telephone Encounter (Signed)
Chest Pain  What were you doing when it began? She was laying down, had just woke up and it was hurting. Is there anything that relieves the pain? Nitroglycerin Can you describe the pain? (Ex. Sharp, dull) sharp pain she had never experienced before. Where does it hurt? Across the chest that radiates down her left arm.  Does it radiate or move across your chest, shoulder, or down your arm? yes On a scale of 1-10, how bad is the pain? 8-9 When did it start and/ or how long has it lasted? This morning and has occurred throughout the day.   Do you have a prescription for Nitroglycerin? yes If yes, have you taken any today? Yes, around 10:30-11:00 am.  Have you taken any erectile dysfunction medications? no If yes, do NOT take Nitroglycerin.  If patient has not taken any erectile dysfunction medications you can instruct patient to take as directed.     [phone]- Christanna Hopewell (patients daughter)

## 2022-12-06 NOTE — ED Provider Notes (Signed)
[place] Provider Note   CSN: [id number] Arrival date & time: 12/06/22  1648     History  Chief Complaint  Patient presents with   Chest Pain    Jeanne [name] is a 87 y.o. female.  HPI   87 year old female presents emergency department after an episode of chest discomfort.  Patient states that it woke her up and started this morning around 4 AM.  It extended across both shoulders and down to her left arm.  She had midsternal chest discomfort.  Initially the discomfort was 10/10, improved with nitroglycerin administration.  She came in because the discomfort did not fully relieve itself.  She had an episode similar to this a little over a week ago.  Was evaluated and referred to cardiology as an outpatient, she is due to see them in 6 days in the office.  Currently she states the chest heaviness is about 3/10.  No associated shortness of breath, swelling of her lower extremities, fever.  Home Medications Prior to Admission medications   Medication Sig Start Date End Date Taking? Authorizing Provider  allopurinol (ZYLOPRIM) 100 MG tablet Take 100 mg by mouth daily. 05/27/20   [provider]  aspirin EC ([facility] ASPIRIN LOW DOSE) 81 MG tablet TAKE 1 TABLET (81 MG TOTAL) BY MOUTH DAILY. SWALLOW WHOLE. 11/16/22   Patwardhan, Reynold Bowen, MD  atorvastatin (LIPITOR) 20 MG tablet Take 1 tablet (20 mg total) by mouth daily. 10/27/22 10/22/23  Patwardhan, Reynold Bowen, MD  Cholecalciferol (VITAMIN D3) 50 MCG (2000 UT) TABS Take 2,000 Units by mouth daily.  07/30/19   [provider]  dapagliflozin propanediol (FARXIGA) 10 MG TABS tablet 1 tablet 07/08/21   [provider]  diltiazem (TIAZAC) 120 MG 24 hr capsule Take 240 mg by mouth daily. 09/08/19   [provider]  escitalopram (LEXAPRO) 10 MG tablet Take 10 mg by mouth daily. 08/21/22   [provider]  esomeprazole (NEXIUM) 20 MG capsule TAKE 1 CAPSULE (20 MG  TOTAL) BY MOUTH DAILY AT 12 NOON. 05/18/22   Patwardhan, Reynold Bowen, MD  ezetimibe (ZETIA) 10 MG tablet Take 10 mg by mouth daily.    [provider]  ferrous sulfate 325 (65 FE) MG tablet Take 325 mg by mouth daily with breakfast.  07/30/19   [provider]  furosemide (LASIX) 20 MG tablet TAKE 1 TABLET BY MOUTH EVERY DAY 09/18/22   Patwardhan, Manish J, MD  hydrALAZINE (APRESOLINE) 25 MG tablet TAKE 1 TABLET BY MOUTH THREE TIMES A DAY 03/27/22   Patwardhan, Manish J, MD  insulin glargine (LANTUS) 100 UNIT/ML injection Inject 65 Units into the skin daily before breakfast.    [provider]  isosorbide mononitrate (IMDUR) 60 MG 24 hr tablet Take 1 tablet (60 mg total) by mouth daily. 11/24/22 12/24/22  Sherwood Gambler, MD  latanoprost (XALATAN) 0.005 % ophthalmic solution INSTILL 1 DROP IN LEFT EYE NIGHTLY 12/05/21   Rankin, Clent Demark, MD  losartan (COZAAR) 25 MG tablet TAKE 2 TABLETS BY MOUTH DAILY 11/16/22   Patwardhan, Reynold Bowen, MD  meclizine (ANTIVERT) 25 MG tablet TAKE 1 TABLET BY MOUTH 3 TIMES DAILY AS NEEDED FOR DIZZINESS. 11/01/22   Alric Ran, MD  nitroGLYCERIN (NITROSTAT) 0.4 MG SL tablet PLACE 1 TABLET UNDER THE TONGUE AS NEEDED FOR CHEST PAIN. 08/21/22   Patwardhan, Reynold Bowen, MD  oxybutynin (DITROPAN-XL) 10 MG 24 hr tablet Take 10 mg by mouth daily.    [provider]  Polyvinyl Alcohol-Povidone (REFRESH OP) Place 1 drop into both eyes at bedtime.    [provider]      Allergies    Amoxicillin, Enalapril maleate, Invokana [canagliflozin], Liraglutide, Pioglitazone, and Tramadol hcl    Review of Systems   Review of Systems  Constitutional:  Negative for fever.  Respiratory:  Positive for chest tightness. Negative for shortness of breath.   Cardiovascular:  Positive for chest pain. Negative for palpitations and leg swelling.  Gastrointestinal:  Negative for abdominal pain, diarrhea and vomiting.  Skin:  Negative for rash.  Neurological:   Negative for headaches.    Physical Exam Updated Vital Signs BP (!) 151/84   Pulse 73   Temp 98.5 F (36.9 C)   Resp 16   Ht 5' 2.5" (1.588 m)   Wt (!) 137 kg   SpO2 100%   BMI 54.36 kg/m  Physical Exam Vitals and nursing note reviewed.  Constitutional:      General: She is not in acute distress.    Appearance: Normal appearance.  HENT:     Head: Normocephalic.     Mouth/Throat:     Mouth: Mucous membranes are moist.  Cardiovascular:     Rate and Rhythm: Normal rate.  Pulmonary:     Effort: Pulmonary effort is normal. No respiratory distress.  Abdominal:     Palpations: Abdomen is soft.     Tenderness: There is no abdominal tenderness.  Skin:    General: Skin is warm.  Neurological:     Mental Status: She is alert and oriented to person, place, and time. Mental status is at baseline.  Psychiatric:        Mood and Affect: Mood normal.     ED Results / Procedures / Treatments   Labs (all labs ordered are listed, but only abnormal results are displayed) Labs Reviewed  BASIC METABOLIC PANEL - Abnormal; Notable for the following components:      Result Value   Creatinine, Ser 1.56 (*)    GFR, Estimated 33 (*)    All other components within normal limits  CBC - Abnormal; Notable for the following components:   RBC 3.78 (*)    Hemoglobin 10.0 (*)    HCT 33.3 (*)    All other components within normal limits  HEPATIC FUNCTION PANEL - Abnormal; Notable for the following components:   Albumin 2.9 (*)    All other components within normal limits  BRAIN NATRIURETIC PEPTIDE - Abnormal; Notable for the following components:   B Natriuretic Peptide 306.5 (*)    All other components within normal limits  TROPONIN I (HIGH SENSITIVITY)  TROPONIN I (HIGH SENSITIVITY)    EKG EKG Interpretation  Date/Time:  Wednesday December 06 2022 16:38:31 EDT Ventricular Rate:  78 PR Interval:  146 QRS Duration: 86 QT Interval:  388 QTC Calculation: 442 R Axis:   -61 Text  Interpretation: Normal sinus rhythm Left axis deviation Low voltage QRS Cannot rule out Anterior infarct , age undetermined Abnormal ECG When compared with ECG of 24-Nov-2022 12:40, PREVIOUS ECG IS PRESENT Similar to previous Confirmed by Lavenia Atlas [phone]) on 12/06/2022 6:13:05 PM  Radiology DG Chest 2 View  Result Date: 12/06/2022 CLINICAL DATA:  Chest pain EXAM: CHEST - 2 VIEW COMPARISON:  X-ray 11/24/2022 and older FINDINGS: Enlarged cardiopericardial silhouette with some vascular congestion. No consolidation, pneumothorax or effusion. No edema. Films are under penetrated. Degenerative changes of the spine. Motion on lateral view. IMPRESSION: Enlarged cardiopericardial silhouette with  vascular congestion. Electronically Signed   By: Jill Side M.D.   On: 12/06/2022 17:50    Procedures Procedures    Medications Ordered in ED Medications  nitroGLYCERIN (NITROGLYN) 2 % ointment 0.5 inch (0.5 inches Topical Given 12/06/22 2106)    ED Course/ Medical Decision Making/ A&P                             Medical Decision Making Amount and/or Complexity of Data Reviewed Labs: ordered. Radiology: ordered.  Risk Prescription drug management.   87 year old female with known cardiac disease on a cath done about 8 years ago presents emergency department with an episode of chest pain.  Woke her up from sleep, traveling across her anterior chest with heaviness and down her left arm.  Relieved with nitroglycerin, slightly present on arrival.  EKG is unchanged for the patient.  Chest x-ray shows continued cardiomegaly with mild congestion, BNP is slightly elevated.  Troponins are 16, unchanged with no delta.  After addition of Nitropaste patient is now almost chest pain-free.  Patient was originally planned to follow-up with cardiology as an outpatient however given that this is her second ER visit with concern for unstable angina we will plan for inpatient evaluation.  Consulted with cardiology  fellow Dr. Elisabeth Cara who agrees.  Patients evaluation and results requires admission for further treatment and care.  Spoke with hospitalist, reviewed patient's ED course and they accept admission.  Patient agrees with admission plan, offers no new complaints and is stable/unchanged at time of admit.        Final Clinical Impression(s) / ED Diagnoses Final diagnoses:  None    Rx / DC Orders ED Discharge Orders     None         Lorelle Gibbs, DO 12/06/22 2206

## 2022-12-06 NOTE — Telephone Encounter (Signed)
Spoke with patient and patients daughter originally. Patient wanted me to call the daughter back with further information/ instruction. I spoke with the daughter about this information, and she said if the pain reoccurs they will take her to the ER, we also moved f/u appt up some since she was scheduled out on the 12th.

## 2022-12-06 NOTE — ED Triage Notes (Signed)
Pt came in via POV d/t Rt sided CP that started approx at 0400 or 0500 & radiates across to her Lt shoulder & down that arm, was initially pain rated 10/10. Took 2 tabs of home Nitro with some relief, CP while in triage she rates 5/10. A/Ox4, denies cardiac Hx.

## 2022-12-06 NOTE — H&P (Signed)
History and Physical      Jeanne [name] [id number] DOB: [birth year] DOA: 12/06/2022  PCP: Seward Carol, MD *** Patient coming from: home ***  I have personally briefly reviewed patient's old medical records in [place]  Chief Complaint: ***  HPI: Jeanne [name] is a 87 y.o. female with medical history significant for *** who is admitted to [facility] on 12/06/2022 with *** after presenting from home*** to [facility] ED complaining of ***.    ***       ***   ED Course:  Vital signs in the ED were notable for the following: ***  Labs were notable for the following: ***  Per my interpretation, EKG in ED demonstrated the following:  ***  Imaging and additional notable ED work-up: ***  While in the ED, the following were administered: ***  Subsequently, the patient was admitted  ***  ***red    Review of Systems: As per HPI otherwise 10 point review of systems negative.   Past Medical History:  Diagnosis Date   Anemia    Arthritis    Carotid arterial disease    COVID-19    Diabetes mellitus    Hyperlipidemia    Hypertension    Migraine    Obesity    Reflux    Sleep apnea     Past Surgical History:  Procedure Laterality Date   CARDIAC CATHETERIZATION N/A 10/12/2015   Procedure: Left Heart Cath and Coronary Angiography;  Surgeon: Belva Crome, MD;  Location: [facility];  Service: Cardiovascular;  Laterality: N/A;   CHOLECYSTECTOMY  50 yrs ago   JOINT REPLACEMENT Right 2005   knee   WISDOM TOOTH EXTRACTION      Social History:  reports that she has never smoked. She has never used smokeless tobacco. She reports that she does not drink alcohol and does not use drugs.   Allergies  Allergen Reactions   Amoxicillin Nausea Only    Has patient had a PCN reaction causing immediate rash, facial/tongue/throat swelling, SOB or lightheadedness with hypotension: "yes, i was dizzy" Has patient had a PCN reaction causing severe rash  involving mucus membranes or skin necrosis: no Did a PCN reaction that required hospitalization : no, called the DR. Did PCN reaction occurring within the last 10 years: unk if all of the above answers are "NO", then may proceed with Cephalosporin use.    Enalapril Maleate     Other reaction(s): angioedema   Invokana [Canagliflozin] Nausea Only and Other (See Comments)    Dizziness    Liraglutide     Other reaction(s): Nausea and dizziness   Pioglitazone     Other reaction(s): macular edema   Tramadol Hcl     Other reaction(s): nausea, headache and dizziness    Family History  Problem Relation Age of Onset   Heart attack Father    Heart attack Mother     Family history reviewed and not pertinent ***   Prior to Admission medications   Medication Sig Start Date End Date Taking? Authorizing Provider  allopurinol (ZYLOPRIM) 100 MG tablet Take 100 mg by mouth daily. 05/27/20   [provider]  aspirin EC ([facility] ASPIRIN LOW DOSE) 81 MG tablet TAKE 1 TABLET (81 MG TOTAL) BY MOUTH DAILY. SWALLOW WHOLE. 11/16/22   Patwardhan, Reynold Bowen, MD  atorvastatin (LIPITOR) 20 MG tablet Take 1 tablet (20 mg total) by mouth daily. 10/27/22 10/22/23  Nigel Mormon, MD  Cholecalciferol (VITAMIN D3) 50  MCG (2000 UT) TABS Take 2,000 Units by mouth daily.  07/30/19   [provider]  dapagliflozin propanediol (FARXIGA) 10 MG TABS tablet 1 tablet 07/08/21   [provider]  diltiazem (TIAZAC) 120 MG 24 hr capsule Take 240 mg by mouth daily. 09/08/19   [provider]  escitalopram (LEXAPRO) 10 MG tablet Take 10 mg by mouth daily. 08/21/22   [provider]  esomeprazole (NEXIUM) 20 MG capsule TAKE 1 CAPSULE (20 MG TOTAL) BY MOUTH DAILY AT 12 NOON. 05/18/22   Patwardhan, Reynold Bowen, MD  ezetimibe (ZETIA) 10 MG tablet Take 10 mg by mouth daily.    [provider]  ferrous sulfate 325 (65 FE) MG tablet Take 325 mg by mouth daily with breakfast.  07/30/19    [provider]  furosemide (LASIX) 20 MG tablet TAKE 1 TABLET BY MOUTH EVERY DAY 09/18/22   Patwardhan, Manish J, MD  hydrALAZINE (APRESOLINE) 25 MG tablet TAKE 1 TABLET BY MOUTH THREE TIMES A DAY 03/27/22   Patwardhan, Manish J, MD  insulin glargine (LANTUS) 100 UNIT/ML injection Inject 65 Units into the skin daily before breakfast.    [provider]  isosorbide mononitrate (IMDUR) 60 MG 24 hr tablet Take 1 tablet (60 mg total) by mouth daily. 11/24/22 12/24/22  Sherwood Gambler, MD  latanoprost (XALATAN) 0.005 % ophthalmic solution INSTILL 1 DROP IN LEFT EYE NIGHTLY 12/05/21   Rankin, Clent Demark, MD  losartan (COZAAR) 25 MG tablet TAKE 2 TABLETS BY MOUTH DAILY 11/16/22   Patwardhan, Reynold Bowen, MD  meclizine (ANTIVERT) 25 MG tablet TAKE 1 TABLET BY MOUTH 3 TIMES DAILY AS NEEDED FOR DIZZINESS. 11/01/22   Alric Ran, MD  nitroGLYCERIN (NITROSTAT) 0.4 MG SL tablet PLACE 1 TABLET UNDER THE TONGUE AS NEEDED FOR CHEST PAIN. 08/21/22   Patwardhan, Reynold Bowen, MD  oxybutynin (DITROPAN-XL) 10 MG 24 hr tablet Take 10 mg by mouth daily.    [provider]  Polyvinyl Alcohol-Povidone (REFRESH OP) Place 1 drop into both eyes at bedtime.    [provider]     Objective    Physical Exam: Vitals:   12/06/22 2120 12/06/22 2122 12/06/22 2123 12/06/22 2215  BP:    (!) 155/70  Pulse: 72 71 73 72  Resp: 20 18 16 12   Temp:      SpO2: 98% 97% 100% 97%  Weight:      Height:        General: appears to be stated age; alert, oriented Skin: warm, dry, no rash Head:  AT/[place] Mouth:  Oral mucosa membranes appear moist, normal dentition Neck: supple; trachea midline Heart:  RRR; did not appreciate any M/R/G Lungs: CTAB, did not appreciate any wheezes, rales, or rhonchi Abdomen: + BS; soft, ND, NT Vascular: 2+ pedal pulses b/l; 2+ radial pulses b/l Extremities: no peripheral edema, no muscle wasting Neuro: strength and sensation intact in upper and lower extremities  b/l ***   *** Neuro: 5/5 strength of the proximal and distal flexors and extensors of the upper and lower extremities bilaterally; sensation intact in upper and lower extremities b/l; cranial nerves II through XII grossly intact; no pronator drift; no evidence suggestive of slurred speech, dysarthria, or facial droop; Normal muscle tone. No tremors.  *** Neuro: In the setting of the patient's current mental status and associated inability to follow instructions, unable to perform full neurologic exam at this time.  As such, assessment of strength, sensation, and cranial nerves is limited at this time. Patient  noted to spontaneously move all 4 extremities. No tremors.  ***    Labs on Admission: I have personally reviewed following labs and imaging studies  CBC: Recent Labs  Lab 12/06/22 1724  WBC 6.3  HGB 10.0*  HCT 33.3*  MCV 88.1  PLT [id number]   Basic Metabolic Panel: Recent Labs  Lab 12/06/22 1724  NA 139  K 3.7  CL 100  CO2 27  GLUCOSE 91  BUN 12  CREATININE 1.56*  CALCIUM 9.2   GFR: Estimated Creatinine Clearance: 36.3 mL/min (A) (by C-G formula based on SCr of 1.56 mg/dL (H)). Liver Function Tests: Recent Labs  Lab 12/06/22 1807  AST 15  ALT 9  ALKPHOS 74  BILITOT 0.5  PROT 6.6  ALBUMIN 2.9*   No results for input(s): "LIPASE", "AMYLASE" in the last 168 hours. No results for input(s): "AMMONIA" in the last 168 hours. Coagulation Profile: No results for input(s): "INR", "PROTIME" in the last 168 hours. Cardiac Enzymes: No results for input(s): "CKTOTAL", "CKMB", "CKMBINDEX", "TROPONINI" in the last 168 hours. BNP (last 3 results) No results for input(s): "PROBNP" in the last 8760 hours. HbA1C: No results for input(s): "HGBA1C" in the last 72 hours. CBG: No results for input(s): "GLUCAP" in the last 168 hours. Lipid Profile: No results for input(s): "CHOL", "HDL", "LDLCALC", "TRIG", "CHOLHDL", "LDLDIRECT" in the last 72 hours. Thyroid Function Tests: No  results for input(s): "TSH", "T4TOTAL", "FREET4", "T3FREE", "THYROIDAB" in the last 72 hours. Anemia Panel: No results for input(s): "VITAMINB12", "FOLATE", "FERRITIN", "TIBC", "IRON", "RETICCTPCT" in the last 72 hours. Urine analysis:    Component Value Date/Time   COLORURINE YELLOW 08/30/2020 2150   APPEARANCEUR HAZY (A) 08/30/2020 2150   LABSPEC 1.017 08/30/2020 2150   PHURINE 5.0 08/30/2020 2150   GLUCOSEU NEGATIVE 08/30/2020 2150   HGBUR NEGATIVE 08/30/2020 2150   BILIRUBINUR NEGATIVE 08/30/2020 2150   KETONESUR NEGATIVE 08/30/2020 2150   PROTEINUR NEGATIVE 08/30/2020 2150   UROBILINOGEN 0.2 09/11/2011 1525   NITRITE NEGATIVE 08/30/2020 2150   LEUKOCYTESUR NEGATIVE 08/30/2020 2150    Radiological Exams on Admission: DG Chest 2 View  Result Date: 12/06/2022 CLINICAL DATA:  Chest pain EXAM: CHEST - 2 VIEW COMPARISON:  X-ray 11/24/2022 and older FINDINGS: Enlarged cardiopericardial silhouette with some vascular congestion. No consolidation, pneumothorax or effusion. No edema. Films are under penetrated. Degenerative changes of the spine. Motion on lateral view. IMPRESSION: Enlarged cardiopericardial silhouette with vascular congestion. Electronically Signed   By: Jill Side M.D.   On: 12/06/2022 17:50      Assessment/Plan   Principal Problem:   Chest pain   ***       ***            ***             ***            ***            ***            ***             ***           ***           ***           ***           ***          ***          ***  DVT prophylaxis: SCD's ***  Code Status:  Full code*** Family Communication: none*** Disposition Plan: Per Rounding Team Consults called: none***;  Admission status: ***     I SPENT GREATER THAN 75 *** MINUTES IN CLINICAL CARE TIME/MEDICAL DECISION-MAKING IN COMPLETING THIS ADMISSION.       [place] DO [facility]  From [place]   12/06/2022, 11:25 PM   ***

## 2022-12-06 NOTE — Telephone Encounter (Signed)
If pain resolved, I will see her in office for follow up. I believe she has appt on 4/12, but can move it sooner. If not relieved, should go to the ER.  Thanks MJP

## 2022-12-07 ENCOUNTER — Observation Stay (HOSPITAL_COMMUNITY): Payer: Medicare Other

## 2022-12-07 ENCOUNTER — Ambulatory Visit (HOSPITAL_COMMUNITY)
Admission: EM | Disposition: A | Discharge status: Home / Self Care | Payer: Self-pay | Source: Home / Self Care | Attending: Emergency Medicine

## 2022-12-07 DIAGNOSIS — R079 Chest pain, unspecified: Secondary | ICD-10-CM | POA: Diagnosis not present

## 2022-12-07 HISTORY — PX: LEFT HEART CATH AND CORONARY ANGIOGRAPHY: CATH118249

## 2022-12-07 LAB — COMPREHENSIVE METABOLIC PANEL
ALT: 6 U/L (ref 0–44)
AST: 14 U/L — ABNORMAL LOW (ref 15–41)
Albumin: 2.7 g/dL — ABNORMAL LOW (ref 3.5–5.0)
Alkaline Phosphatase: 72 U/L (ref 38–126)
Anion gap: 12 (ref 5–15)
BUN: 12 mg/dL (ref 8–23)
CO2: 27 mmol/L (ref 22–32)
Calcium: 9 mg/dL (ref 8.9–10.3)
Chloride: 99 mmol/L (ref 98–111)
Creatinine, Ser: 1.44 mg/dL — ABNORMAL HIGH (ref 0.44–1.00)
GFR, Estimated: 36 mL/min — ABNORMAL LOW (ref >60)
Glucose, Bld: 69 mg/dL — ABNORMAL LOW (ref 70–99)
Potassium: 3.5 mmol/L (ref 3.5–5.1)
Sodium: 138 mmol/L (ref 135–145)
Total Bilirubin: 0.6 mg/dL (ref 0.3–1.2)
Total Protein: 6.3 g/dL — ABNORMAL LOW (ref 6.5–8.1)

## 2022-12-07 LAB — HEPARIN LEVEL (UNFRACTIONATED): Heparin Unfractionated: 0.14 IU/mL — ABNORMAL LOW (ref 0.30–0.70)

## 2022-12-07 LAB — CBC WITH DIFFERENTIAL/PLATELET
Abs Immature Granulocytes: 0.02 10*3/uL (ref 0.00–0.07)
Basophils Absolute: 0 10*3/uL (ref 0.0–0.1)
Basophils Relative: 1 %
Eosinophils Absolute: 0.3 10*3/uL (ref 0.0–0.5)
Eosinophils Relative: 5 %
HCT: 32.2 % — ABNORMAL LOW (ref 36.0–46.0)
Hemoglobin: 10 g/dL — ABNORMAL LOW (ref 12.0–15.0)
Immature Granulocytes: 0 %
Lymphocytes Relative: 33 %
Lymphs Abs: 2.2 10*3/uL (ref 0.7–4.0)
MCH: 26.8 pg (ref 26.0–34.0)
MCHC: 31.1 g/dL (ref 30.0–36.0)
MCV: 86.3 fL (ref 80.0–100.0)
Monocytes Absolute: 0.6 10*3/uL (ref 0.1–1.0)
Monocytes Relative: 9 %
Neutro Abs: 3.4 10*3/uL (ref 1.7–7.7)
Neutrophils Relative %: 52 %
Platelets: 246 10*3/uL (ref 150–400)
RBC: 3.73 MIL/uL — ABNORMAL LOW (ref 3.87–5.11)
RDW: 14.6 % (ref 11.5–15.5)
WBC: 6.6 10*3/uL (ref 4.0–10.5)
nRBC: 0 % (ref 0.0–0.2)

## 2022-12-07 LAB — CBC
HCT: 33.6 % — ABNORMAL LOW (ref 36.0–46.0)
Hemoglobin: 10.6 g/dL — ABNORMAL LOW (ref 12.0–15.0)
MCH: 27 pg (ref 26.0–34.0)
MCHC: 31.5 g/dL (ref 30.0–36.0)
MCV: 85.7 fL (ref 80.0–100.0)
Platelets: 240 10*3/uL (ref 150–400)
RBC: 3.92 MIL/uL (ref 3.87–5.11)
RDW: 14.6 % (ref 11.5–15.5)
WBC: 6.2 10*3/uL (ref 4.0–10.5)
nRBC: 0 % (ref 0.0–0.2)

## 2022-12-07 LAB — CREATININE, SERUM
Creatinine, Ser: 1.37 mg/dL — ABNORMAL HIGH (ref 0.44–1.00)
GFR, Estimated: 38 mL/min — ABNORMAL LOW (ref >60)

## 2022-12-07 LAB — LIPASE, BLOOD: Lipase: 25 U/L (ref 11–51)

## 2022-12-07 LAB — GLUCOSE, CAPILLARY: Glucose-Capillary: 102 mg/dL — ABNORMAL HIGH (ref 70–99)

## 2022-12-07 LAB — LIPID PANEL
Cholesterol: 116 mg/dL (ref 0–200)
HDL: 39 mg/dL — ABNORMAL LOW (ref >40)
LDL Cholesterol: 62 mg/dL (ref 0–99)
Total CHOL/HDL Ratio: 3 RATIO
Triglycerides: 73 mg/dL (ref <150)
VLDL: 15 mg/dL (ref 0–40)

## 2022-12-07 LAB — TSH: TSH: 2.4 u[IU]/mL (ref 0.350–4.500)

## 2022-12-07 LAB — CBG MONITORING, ED
Glucose-Capillary: 61 mg/dL — ABNORMAL LOW (ref 70–99)
Glucose-Capillary: 97 mg/dL (ref 70–99)

## 2022-12-07 LAB — TROPONIN I (HIGH SENSITIVITY): Troponin I (High Sensitivity): 15 ng/L (ref <18)

## 2022-12-07 LAB — MAGNESIUM
Magnesium: 2 mg/dL (ref 1.7–2.4)
Magnesium: 1.8 mg/dL (ref 1.7–2.4)

## 2022-12-07 SURGERY — LEFT HEART CATH AND CORONARY ANGIOGRAPHY
Anesthesia: LOCAL

## 2022-12-07 MED ORDER — HEPARIN BOLUS VIA INFUSION
2550.0000 [IU] | Freq: Once | INTRAVENOUS | Status: DC
  Filled 2022-12-07: qty 2550

## 2022-12-07 MED ORDER — LABETALOL HCL 5 MG/ML IV SOLN: 10.0000 mg | INTRAVENOUS | Status: AC | PRN

## 2022-12-07 MED ORDER — HYDRALAZINE HCL 20 MG/ML IJ SOLN: 10.0000 mg | INTRAMUSCULAR | Status: AC | PRN

## 2022-12-07 MED ORDER — ONDANSETRON HCL 4 MG/2ML IJ SOLN: 4.0000 mg | Freq: Four times a day (QID) | INTRAMUSCULAR | Status: DC | PRN

## 2022-12-07 MED ORDER — HEPARIN (PORCINE) IN NACL 1000-0.9 UT/500ML-% IV SOLN
INTRAVENOUS | Status: DC | PRN
  Administered 2022-12-07 (×2): 500 mL

## 2022-12-07 MED ORDER — METOPROLOL SUCCINATE ER 50 MG PO TB24
50.0000 mg | ORAL_TABLET | Freq: Every day | ORAL | Status: DC
  Administered 2022-12-07 – 2022-12-08 (×2): 50 mg via ORAL
  Filled 2022-12-07: qty 2
  Filled 2022-12-07: qty 1

## 2022-12-07 MED ORDER — IOHEXOL 350 MG/ML SOLN
INTRAVENOUS | Status: DC | PRN
  Administered 2022-12-07: 50 mL

## 2022-12-07 MED ORDER — SODIUM CHLORIDE 0.9 % IV SOLN: INTRAVENOUS | Status: AC

## 2022-12-07 MED ORDER — LACTATED RINGERS IV SOLN: INTRAVENOUS | Status: AC

## 2022-12-07 MED ORDER — HEPARIN (PORCINE) 25000 UT/250ML-% IV SOLN
1600.0000 [IU]/h | INTRAVENOUS | Status: DC
  Administered 2022-12-07: 1300 [IU]/h via INTRAVENOUS
  Filled 2022-12-07: qty 250

## 2022-12-07 MED ORDER — ACETAMINOPHEN 325 MG PO TABS: 650.0000 mg | ORAL_TABLET | ORAL | Status: DC | PRN

## 2022-12-07 MED ORDER — HEPARIN SODIUM (PORCINE) 1000 UNIT/ML IJ SOLN
INTRAMUSCULAR | Status: AC
  Filled 2022-12-07: qty 10

## 2022-12-07 MED ORDER — SODIUM CHLORIDE 0.9 % WEIGHT BASED INFUSION: 3.0000 mL/kg/h | INTRAVENOUS | Status: AC

## 2022-12-07 MED ORDER — SODIUM CHLORIDE 0.9 % WEIGHT BASED INFUSION: 1.0000 mL/kg/h | INTRAVENOUS | Status: DC

## 2022-12-07 MED ORDER — AMLODIPINE BESYLATE 5 MG PO TABS
5.0000 mg | ORAL_TABLET | Freq: Every day | ORAL | Status: DC
  Administered 2022-12-07 – 2022-12-08 (×2): 5 mg via ORAL
  Filled 2022-12-07 (×2): qty 1

## 2022-12-07 MED ORDER — LIDOCAINE HCL (PF) 1 % IJ SOLN
INTRAMUSCULAR | Status: DC | PRN
  Administered 2022-12-07: 10 mL
  Administered 2022-12-07: 2 mL

## 2022-12-07 MED ORDER — HEPARIN SODIUM (PORCINE) 5000 UNIT/ML IJ SOLN
5000.0000 [IU] | Freq: Three times a day (TID) | INTRAMUSCULAR | Status: DC
  Administered 2022-12-08: 5000 [IU] via SUBCUTANEOUS
  Filled 2022-12-07: qty 1

## 2022-12-07 MED ORDER — FENTANYL CITRATE (PF) 100 MCG/2ML IJ SOLN
INTRAMUSCULAR | Status: DC | PRN
  Administered 2022-12-07: 50 ug via INTRAVENOUS

## 2022-12-07 MED ORDER — VERAPAMIL HCL 2.5 MG/ML IV SOLN
INTRAVENOUS | Status: DC | PRN
  Administered 2022-12-07: 10 mL via INTRA_ARTERIAL

## 2022-12-07 MED ORDER — HEPARIN BOLUS VIA INFUSION
4000.0000 [IU] | Freq: Once | INTRAVENOUS | Status: AC
  Administered 2022-12-07: 4000 [IU] via INTRAVENOUS
  Filled 2022-12-07: qty 4000

## 2022-12-07 MED ORDER — SODIUM CHLORIDE 0.9 % IV SOLN: 250.0000 mL | INTRAVENOUS | Status: DC | PRN

## 2022-12-07 MED ORDER — FENTANYL CITRATE (PF) 100 MCG/2ML IJ SOLN
INTRAMUSCULAR | Status: AC
  Filled 2022-12-07: qty 2

## 2022-12-07 MED ORDER — LIDOCAINE HCL (PF) 1 % IJ SOLN
INTRAMUSCULAR | Status: AC
  Filled 2022-12-07: qty 30

## 2022-12-07 MED ORDER — MIDAZOLAM HCL 2 MG/2ML IJ SOLN
INTRAMUSCULAR | Status: AC
  Filled 2022-12-07: qty 2

## 2022-12-07 MED ORDER — VERAPAMIL HCL 2.5 MG/ML IV SOLN
INTRAVENOUS | Status: AC
  Filled 2022-12-07: qty 2

## 2022-12-07 MED ORDER — SODIUM CHLORIDE 0.9% FLUSH
3.0000 mL | Freq: Two times a day (BID) | INTRAVENOUS | Status: DC
  Administered 2022-12-07: 3 mL via INTRAVENOUS

## 2022-12-07 MED ORDER — MIDAZOLAM HCL 2 MG/2ML IJ SOLN
INTRAMUSCULAR | Status: DC | PRN
  Administered 2022-12-07: 1 mg via INTRAVENOUS

## 2022-12-07 MED ORDER — SODIUM CHLORIDE 0.9% FLUSH: 3.0000 mL | INTRAVENOUS | Status: DC | PRN

## 2022-12-07 MED ORDER — HEPARIN SODIUM (PORCINE) 1000 UNIT/ML IJ SOLN: INTRAMUSCULAR | Status: DC | PRN

## 2022-12-07 SURGICAL SUPPLY — 15 items
CATH 5FR JL3.5 JR4 ANG PIG MP (CATHETERS) IMPLANT
CATH INFINITI 5FR JL4 (CATHETERS) IMPLANT
CLOSURE MYNX CONTROL 5F (Vascular Products) IMPLANT
DEVICE RAD COMP TR BAND LRG (VASCULAR PRODUCTS) IMPLANT
GLIDESHEATH SLEND SS 6F .021 (SHEATH) IMPLANT
GUIDEWIRE INQWIRE 1.5J.035X260 (WIRE) IMPLANT
GUIDEWIRE SAFE TJ AMPLATZ EXST (WIRE) IMPLANT
INQWIRE 1.5J .035X260CM (WIRE) ×1
KIT HEART LEFT (KITS) ×2 IMPLANT
KIT MICROPUNCTURE NIT STIFF (SHEATH) IMPLANT
PACK CARDIAC CATHETERIZATION (CUSTOM PROCEDURE TRAY) ×2 IMPLANT
SHEATH PINNACLE 5F 10CM (SHEATH) IMPLANT
TRANSDUCER W/STOPCOCK (MISCELLANEOUS) ×2 IMPLANT
TUBING CIL FLEX 10 FLL-RA (TUBING) ×2 IMPLANT
WIRE HI TORQ VERSACORE-J 145CM (WIRE) IMPLANT

## 2022-12-07 NOTE — Assessment & Plan Note (Signed)
Pain onset at rest while laying down to sleep, chest and arm, sharp and severe.  No associated SOB, orthopnea.  Troponins ruled out ACS.

## 2022-12-07 NOTE — Hospital Course (Signed)
Jeanne [name] is an 87 y.o. F with CAD no prior PCI, DM, HTN, CKD IV baseline 1.5-1.8, OSA, and morbid obesity and MGUS who presented with atypical chest pain.    In the ER, troponins and ECG were unremarkable.  CXR with CM and interstitial opacities.  BNP elevated.  Admitted for ACS rule out.

## 2022-12-07 NOTE — Assessment & Plan Note (Signed)
CKD III ruled out

## 2022-12-07 NOTE — Interval H&P Note (Signed)
History and Physical Interval Note:  12/07/2022 1:38 PM  Jeanne [name]  has presented today for surgery, with the diagnosis of Chest pain.  The various methods of treatment have been discussed with the patient and family. After consideration of risks, benefits and other options for treatment, the patient has consented to  Procedure(s): LEFT HEART CATH AND CORONARY ANGIOGRAPHY (N/A) as a surgical intervention.  The patient's history has been reviewed, patient examined, no change in status, stable for surgery.  I have reviewed the patient's chart and labs.  Questions were answered to the patient's satisfaction.    2016 Appropriate Use Criteria for Coronary Revascularization in Patients With Acute Coronary Syndrome NSTEMI/Unstable angina, stabilized patient at Intermediate Risk (TIMI Score 3-4) Indication:  Revascularization by PCI or CABG of 1 or more arteries in a patient with NSTEMI or unstable angina with Stabilization after presentation Intermediate risk for clinical events A (7) Indication: 16; Score 7   [place]

## 2022-12-07 NOTE — Progress Notes (Signed)
ANTICOAGULATION CONSULT NOTE - Initial Consult  Pharmacy Consult for Heparin infusion Indication: chest pain/ACS  Allergies  Allergen Reactions   Amoxicillin Nausea Only    Has patient had a PCN reaction causing immediate rash, facial/tongue/throat swelling, SOB or lightheadedness with hypotension: "yes, i was dizzy" Has patient had a PCN reaction causing severe rash involving mucus membranes or skin necrosis: no Did a PCN reaction that required hospitalization : no, called the DR. Did PCN reaction occurring within the last 10 years: unk if all of the above answers are "NO", then may proceed with Cephalosporin use.    Enalapril Maleate     Other reaction(s): angioedema   Invokana [Canagliflozin] Nausea Only and Other (See Comments)    Dizziness    Liraglutide     Other reaction(s): Nausea and dizziness   Pioglitazone     Other reaction(s): macular edema   Tramadol Hcl     Other reaction(s): nausea, headache and dizziness    Patient Measurements: Height: 5' 2.5" (158.8 cm) Weight: (!) 137 kg (302 lb) IBW/kg (Calculated) : 51.25 Heparin Dosing Weight: 85 kg  Vital Signs: Temp: 97.9 F (36.6 C) (04/04 0700) BP: 152/69 (04/04 0700) Pulse Rate: 64 (04/04 0700)  Labs: Recent Labs    12/06/22 1724 12/06/22 1942 12/07/22 0404 12/07/22 0753  HGB 10.0*  --  10.0*  --   HCT 33.3*  --  32.2*  --   PLT 270  --  246  --   HEPARINUNFRC  --   --   --  0.14*  CREATININE 1.56*  --  1.44*  --   TROPONINIHS 16 16 15   --      Estimated Creatinine Clearance: 39.3 mL/min (A) (by C-G formula based on SCr of 1.44 mg/dL (H)).   Medical History: Past Medical History:  Diagnosis Date   Anemia    Arthritis    Carotid arterial disease    COVID-19    Diabetes mellitus    Hyperlipidemia    Hypertension    Migraine    Obesity    Reflux    Sleep apnea     Medications:  No current facility-administered medications on file prior to encounter.   Current Outpatient Medications  on File Prior to Encounter  Medication Sig Dispense Refill   allopurinol (ZYLOPRIM) 100 MG tablet Take 100 mg by mouth daily.     aspirin EC ([facility] ASPIRIN LOW DOSE) 81 MG tablet TAKE 1 TABLET (81 MG TOTAL) BY MOUTH DAILY. SWALLOW WHOLE. 90 tablet 0   atorvastatin (LIPITOR) 20 MG tablet Take 1 tablet (20 mg total) by mouth daily. 90 tablet 3   Cholecalciferol (VITAMIN D3) 50 MCG (2000 UT) TABS Take 2,000 Units by mouth daily.      dapagliflozin propanediol (FARXIGA) 10 MG TABS tablet 1 tablet     diltiazem (TIAZAC) 120 MG 24 hr capsule Take 240 mg by mouth daily.     escitalopram (LEXAPRO) 10 MG tablet Take 10 mg by mouth daily.     esomeprazole (NEXIUM) 20 MG capsule TAKE 1 CAPSULE (20 MG TOTAL) BY MOUTH DAILY AT 12 NOON. 90 capsule 1   ezetimibe (ZETIA) 10 MG tablet Take 10 mg by mouth daily.     ferrous sulfate 325 (65 FE) MG tablet Take 325 mg by mouth daily with breakfast.      furosemide (LASIX) 20 MG tablet TAKE 1 TABLET BY MOUTH EVERY DAY 90 tablet 1   hydrALAZINE (APRESOLINE) 25 MG tablet TAKE 1 TABLET BY MOUTH  THREE TIMES A DAY 270 tablet 3   insulin glargine (LANTUS) 100 UNIT/ML injection Inject 65 Units into the skin daily before breakfast.     isosorbide mononitrate (IMDUR) 60 MG 24 hr tablet Take 1 tablet (60 mg total) by mouth daily. 30 tablet 0   latanoprost (XALATAN) 0.005 % ophthalmic solution INSTILL 1 DROP IN LEFT EYE NIGHTLY 7.5 mL 12   losartan (COZAAR) 25 MG tablet TAKE 2 TABLETS BY MOUTH DAILY 180 tablet 0   meclizine (ANTIVERT) 25 MG tablet TAKE 1 TABLET BY MOUTH 3 TIMES DAILY AS NEEDED FOR DIZZINESS. 30 tablet 0   nitroGLYCERIN (NITROSTAT) 0.4 MG SL tablet PLACE 1 TABLET UNDER THE TONGUE AS NEEDED FOR CHEST PAIN. 25 tablet 3   oxybutynin (DITROPAN-XL) 10 MG 24 hr tablet Take 10 mg by mouth daily.     Polyvinyl Alcohol-Povidone (REFRESH OP) Place 1 drop into both eyes at bedtime.       Assessment: 87 y.o. female with history of non-obstructive CAD, HTN, HLD, T2DM, OSA,  IgG kappa monoclonal gammopathy presents to ED with chest pain. Pt was not taking any anticoagulation prior to admission. Pharmacy consulted to dose and manage heparin infusion per ACS protocol.  Heparin level, 0.14, subtherapeutic Current heparin infusion rate: 1300 units/hr  Hgb 10, Plt 246 No s/sx of bleeding, per RN report  Goal of Therapy:  Heparin level 0.3-0.7 units/ml Monitor platelets by anticoagulation protocol: Yes   Plan:  Rebolus heparin 2550 units IV x1, then  Increase heparin infusion rate to 1600 units/hr  Monitor daily CBC, heparin level, and for s/sx of bleeding F/u plan for anticoag s/p LHC this afternoon  Luisa Hart, PharmD, BCPS Clinical Pharmacist 12/07/2022 8:59 AM   Please refer to AMION for pharmacy phone number

## 2022-12-07 NOTE — Inpatient Diabetes Management (Signed)
Inpatient Diabetes Program Recommendations  AACE/ADA: New Consensus Statement on Inpatient Glycemic Control (2015)  Target Ranges:  Prepandial:   less than 140 mg/dL      Peak postprandial:   less than 180 mg/dL (1-2 hours)      Critically ill patients:  140 - 180 mg/dL   Lab Results  Component Value Date   GLUCAP 61 (L) 12/07/2022   HGBA1C 6.6 (H) 10/09/2015    Review of Glycemic Control  Latest Reference Range & Units 12/07/22 07:53  Glucose-Capillary 70 - 99 mg/dL 61 (L)  (L): Data is abnormally low Diabetes history: Type 2 DM Outpatient Diabetes medications: Farxiga 10 mg QD, Lantus 65 units QD Current orders for Inpatient glycemic control:Semglee 25 units QD, Novolog 0-9 units TID  Inpatient Diabetes Program Recommendations:    Noted hypoglycemia this AM of 61 mg/dL. Consider discontinuing Semglee at this time.   Thanks, Bronson Curb, MSN, RNC-OB Diabetes Coordinator [phone] (8a-5p)

## 2022-12-07 NOTE — Progress Notes (Addendum)
  Progress Note   Patient: Jeanne [name] [id number] DOB: 03/14/1936 DOA: 12/06/2022     0 DOS: the patient was seen and examined on 12/07/2022 at 1:45PM      Brief hospital course: Jeanne [name] is an 87 y.o. F with CAD no prior PCI, DM, HTN, CKD IV baseline 1.5-1.8, OSA, and morbid obesity and MGUS who presented with atypical chest pain.    In the ER, troponins and ECG were unremarkable.  CXR with CM and interstitial opacities.  BNP elevated.  Admitted for ACS rule out.     Assessment and Plan: * Chest pain Pain onset at rest while laying down to sleep, chest and arm, sharp and severe.  No associated SOB, orthopnea.  Troponins ruled out ACS.    CKD (chronic kidney disease), stage IV CKD III ruled out  Hypertension Cardiology have changed diltiazem to amlodipine and starting metoprolol        Subjective: Feels well     Physical Exam: BP (!) 169/99   Pulse (!) 0   Temp 97.9 F (36.6 C)   Resp (!) 23   Ht 5' 2.5" (1.588 m)   Wt (!) 137 kg   SpO2 95%   BMI 54.36 kg/m   Adult female, lying in bed, no acute distress RRR no murmurs, no LE edema Respiratory status normal, clear without rales   Data Reviewed: As above  Family Communication: Daughter    Disposition: Status is: Observation          Author: Edwin Dada, MD 12/07/2022 5:49 PM  For on call review [email or web address].

## 2022-12-07 NOTE — Progress Notes (Signed)
ANTICOAGULATION CONSULT NOTE - Initial Consult  Pharmacy Consult for Heparin Indication: chest pain/ACS  Allergies  Allergen Reactions   Amoxicillin Nausea Only    Has patient had a PCN reaction causing immediate rash, facial/tongue/throat swelling, SOB or lightheadedness with hypotension: "yes, i was dizzy" Has patient had a PCN reaction causing severe rash involving mucus membranes or skin necrosis: no Did a PCN reaction that required hospitalization : no, called the DR. Did PCN reaction occurring within the last 10 years: unk if all of the above answers are "NO", then may proceed with Cephalosporin use.    Enalapril Maleate     Other reaction(s): angioedema   Invokana [Canagliflozin] Nausea Only and Other (See Comments)    Dizziness    Liraglutide     Other reaction(s): Nausea and dizziness   Pioglitazone     Other reaction(s): macular edema   Tramadol Hcl     Other reaction(s): nausea, headache and dizziness    Patient Measurements: Height: 5' 2.5" (158.8 cm) Weight: (!) 137 kg (302 lb) IBW/kg (Calculated) : 51.25 Heparin Dosing Weight: 85 kg  Vital Signs: Temp: 98.5 F (36.9 C) (04/03 1656) BP: 151/90 (04/04 0000) Pulse Rate: 71 (04/04 0000)  Labs: Recent Labs    12/06/22 1724 12/06/22 1942  HGB 10.0*  --   HCT 33.3*  --   PLT 270  --   CREATININE 1.56*  --   TROPONINIHS 16 16    Estimated Creatinine Clearance: 36.3 mL/min (A) (by C-G formula based on SCr of 1.56 mg/dL (H)).   Medical History: Past Medical History:  Diagnosis Date   Anemia    Arthritis    Carotid arterial disease    COVID-19    Diabetes mellitus    Hyperlipidemia    Hypertension    Migraine    Obesity    Reflux    Sleep apnea     Medications:  No current facility-administered medications on file prior to encounter.   Current Outpatient Medications on File Prior to Encounter  Medication Sig Dispense Refill   allopurinol (ZYLOPRIM) 100 MG tablet Take 100 mg by mouth daily.      aspirin EC ([facility] ASPIRIN LOW DOSE) 81 MG tablet TAKE 1 TABLET (81 MG TOTAL) BY MOUTH DAILY. SWALLOW WHOLE. 90 tablet 0   atorvastatin (LIPITOR) 20 MG tablet Take 1 tablet (20 mg total) by mouth daily. 90 tablet 3   Cholecalciferol (VITAMIN D3) 50 MCG (2000 UT) TABS Take 2,000 Units by mouth daily.      dapagliflozin propanediol (FARXIGA) 10 MG TABS tablet 1 tablet     diltiazem (TIAZAC) 120 MG 24 hr capsule Take 240 mg by mouth daily.     escitalopram (LEXAPRO) 10 MG tablet Take 10 mg by mouth daily.     esomeprazole (NEXIUM) 20 MG capsule TAKE 1 CAPSULE (20 MG TOTAL) BY MOUTH DAILY AT 12 NOON. 90 capsule 1   ezetimibe (ZETIA) 10 MG tablet Take 10 mg by mouth daily.     ferrous sulfate 325 (65 FE) MG tablet Take 325 mg by mouth daily with breakfast.      furosemide (LASIX) 20 MG tablet TAKE 1 TABLET BY MOUTH EVERY DAY 90 tablet 1   hydrALAZINE (APRESOLINE) 25 MG tablet TAKE 1 TABLET BY MOUTH THREE TIMES A DAY 270 tablet 3   insulin glargine (LANTUS) 100 UNIT/ML injection Inject 65 Units into the skin daily before breakfast.     isosorbide mononitrate (IMDUR) 60 MG 24 hr tablet Take 1  tablet (60 mg total) by mouth daily. 30 tablet 0   latanoprost (XALATAN) 0.005 % ophthalmic solution INSTILL 1 DROP IN LEFT EYE NIGHTLY 7.5 mL 12   losartan (COZAAR) 25 MG tablet TAKE 2 TABLETS BY MOUTH DAILY 180 tablet 0   meclizine (ANTIVERT) 25 MG tablet TAKE 1 TABLET BY MOUTH 3 TIMES DAILY AS NEEDED FOR DIZZINESS. 30 tablet 0   nitroGLYCERIN (NITROSTAT) 0.4 MG SL tablet PLACE 1 TABLET UNDER THE TONGUE AS NEEDED FOR CHEST PAIN. 25 tablet 3   oxybutynin (DITROPAN-XL) 10 MG 24 hr tablet Take 10 mg by mouth daily.     Polyvinyl Alcohol-Povidone (REFRESH OP) Place 1 drop into both eyes at bedtime.       Assessment: 87 y.o. female with chest pain for heparin  Goal of Therapy:  Heparin level 0.3-0.7 units/ml Monitor platelets by anticoagulation protocol: Yes   Plan:  Heparin 4000 units IV bolus, then start  heparin 1300 units/hr  Check heparin level in 8 hours.   Caryl Pina 12/07/2022,12:38 AM

## 2022-12-07 NOTE — H&P (View-Only) (Signed)
CARDIOLOGY CONSULT NOTE  Patient ID: Jeanne [name] MRN: [id number] DOB/AGE: 85/85/1937 85 y.o.  Admit date: 12/06/2022 Referring Physician: [name] Reason for Consultation:  Chest pain  HPI:   [name] with nonobstructive CAD, hypertension, hyperlipidemia, type 2 DM, OSA,  IgG kappa monoclonal gammopathy, admitted with chest pain  In the last few days, patient has had recurrent chest pains and ER visits due to chest pain.  Pain is present at rest, retrosternal, + shortness of breath.  Pain improved with sublingual nitroglycerin.  Recently, her Imdur dose was increased to 60 mg.  However, patient has continued to have chest pains.  Patient presented to [facility], ER last night with sudden onset chest and arm pain.  This was not associated with any shortness of breath or diaphoresis.  High-sensitivity troponins were normal X2.  Blood pressure is elevated at this time.  Past Medical History:  Diagnosis Date   Anemia    Arthritis    Carotid arterial disease    COVID-19    Diabetes mellitus    Hyperlipidemia    Hypertension    Migraine    Obesity    Reflux    Sleep apnea      Past Surgical History:  Procedure Laterality Date   CARDIAC CATHETERIZATION N/A 10/12/2015   Procedure: Left Heart Cath and Coronary Angiography;  Surgeon: Belva Crome, MD;  Location: [place] [facility];  Service: Cardiovascular;  Laterality: N/A;   CHOLECYSTECTOMY  50 yrs ago   JOINT REPLACEMENT Right 2005   knee   WISDOM TOOTH EXTRACTION        Family History  Problem Relation Age of Onset   Heart attack Father    Heart attack Mother      Social History: Social History   Socioeconomic History   Marital status: Widowed    Spouse name: Not on file   Number of children: 2   Years of education: Not on file   Highest education level: Not on file  Occupational History   Not on file  Tobacco Use   Smoking status: Never   Smokeless tobacco: Never  Vaping Use    Vaping Use: Never used  Substance and Sexual Activity   Alcohol use: Never   Drug use: No   Sexual activity: Not on file  Other Topics Concern   Not on file  Social History Narrative   Not on file   Social Determinants of Health   Financial Resource Strain: Not on file  Food Insecurity: Not on file  Transportation Needs: Not on file  Physical Activity: Not on file  Stress: Not on file  Social Connections: Not on file  Intimate Partner Violence: Not on file     (Not in a hospital admission)   Review of Systems  Cardiovascular:  Positive for chest pain (Currently resolved). Negative for dyspnea on exertion, leg swelling, palpitations and syncope.      Physical Exam: Physical Exam Vitals and nursing note reviewed.  Constitutional:      General: She is not in acute distress.    Appearance: She is obese.  Neck:     Vascular: No JVD.  Cardiovascular:     Rate and Rhythm: Normal rate and regular rhythm.     Heart sounds: Normal heart sounds. No murmur heard. Pulmonary:     Effort: Pulmonary effort is normal.     Breath sounds: Normal breath sounds. No wheezing or rales.  Musculoskeletal:  Right lower leg: No edema.     Left lower leg: No edema.        Imaging/tests reviewed and independently interpreted: Lab Results: CBC, BMP, trop. BNP  Cardiac Studies:  Telemetry 12/07/2022: No significant arrhythmia  EKG 12/06/2022: Normal sinus rhythm Left axis deviation Low voltage QRS Cannot rule out Anterior infarct , age undetermined Abnormal ECG When compared with ECG of 22-  Echocardiogram 05/27/2021:  Poor echo window and foreshortened views due to patient body habitus.  Measurements may not be accurate.  Mildly depressed LV systolic function with EF 50%. Left ventricle cavity  is normal in size. Severe concentric hypertrophy of the left ventricle.  Normal global wall motion. Doppler evidence of grade II (pseudonormal)  diastolic dysfunction, elevated LAP.  Calculated EF 50%.  Left atrial cavity is moderately dilated.  Right atrial cavity is mildly dilated.  Right ventricle cavity is moderately dilated. Mildly reduced right  ventricular function.  Mild (Grade I) mitral regurgitation.  Moderate tricuspid regurgitation. Mild pulmonary hypertension. RVSP  measures 39 mmHg.  No significnat change since 06/18/2018.    Assessment & Recommendations:  [name] with nonobstructive CAD, hypertension, hyperlipidemia, type 2 DM, OSA,  IgG kappa monoclonal gammopathy, admitted with chest pain  Chest pain: Recurrent, nitrate responsive chest pain, albeit without any cardiac enzyme elevation. Known CAD with 50% LAD disease noted in 2017. Given patient's recurrent symptoms in spite of optimal antianginal therapy and recurrent ER visits for chest pain, recommend coronary angiography and possible intervention for definitive evaluation. Continue aspirin, heparin, statin, Zetia. Plan to perform coronary angiography later this afternoon.  Patient can eat breakfast. Risks, benefits, alternate options discussed with the patient in detail. Given her renal function has rather improved compared to prior, her risk of CIN is lower than in the past. Will check echocardiogram.  Hypertension: Blood pressure elevated today. On losartan 50 mg daily, hydralazine 25 mg 3 times daily, diltiazem 120 mg daily, Imdur 30 mg daily.  At home. Hold losartan with plans for cath today. Change diltiazem to amlodipine 5 mg daily, and add metoprolol succinate 50 mg daily.  Elevated BNP: Mild vascular congestion on chest x-ray.  Will offer gentle hydration with preparation for cath.  May also need to use Lasix in addition, should she have any breathing difficulty.  Discussed interpretation of tests and management recommendations with the primary team     Nigel Mormon, MD Pager: [phone] Office: [phone]

## 2022-12-07 NOTE — Consult Note (Addendum)
CARDIOLOGY CONSULT NOTE  Patient ID: MAUDENE ARSENAULT MRN: [id number] DOB/AGE: 12-01-1935 87 y.o.  Admit date: 12/06/2022 Referring Physician: [facility] Reason for Consultation:  Chest pain  HPI:   [name] with nonobstructive CAD, hypertension, hyperlipidemia, type 2 DM, OSA,  IgG kappa monoclonal gammopathy, admitted with chest pain  In the last few days, patient has had recurrent chest pains and ER visits due to chest pain.  Pain is present at rest, retrosternal, + shortness of breath.  Pain improved with sublingual nitroglycerin.  Recently, her Imdur dose was increased to 60 mg.  However, patient has continued to have chest pains.  Patient presented to [facility], ER last night with sudden onset chest and arm pain.  This was not associated with any shortness of breath or diaphoresis.  High-sensitivity troponins were normal X2.  Blood pressure is elevated at this time.  Past Medical History:  Diagnosis Date   Anemia    Arthritis    Carotid arterial disease    COVID-19    Diabetes mellitus    Hyperlipidemia    Hypertension    Migraine    Obesity    Reflux    Sleep apnea      Past Surgical History:  Procedure Laterality Date   CARDIAC CATHETERIZATION N/A 10/12/2015   Procedure: Left Heart Cath and Coronary Angiography;  Surgeon: Belva Crome, MD;  Location: [place] [facility];  Service: Cardiovascular;  Laterality: N/A;   CHOLECYSTECTOMY  50 yrs ago   JOINT REPLACEMENT Right 2005   knee   WISDOM TOOTH EXTRACTION        Family History  Problem Relation Age of Onset   Heart attack Father    Heart attack Mother      Social History: Social History   Socioeconomic History   Marital status: Widowed    Spouse name: Not on file   Number of children: 2   Years of education: Not on file   Highest education level: Not on file  Occupational History   Not on file  Tobacco Use   Smoking status: Never   Smokeless tobacco: Never  Vaping Use    Vaping Use: Never used  Substance and Sexual Activity   Alcohol use: Never   Drug use: No   Sexual activity: Not on file  Other Topics Concern   Not on file  Social History Narrative   Not on file   Social Determinants of Health   Financial Resource Strain: Not on file  Food Insecurity: Not on file  Transportation Needs: Not on file  Physical Activity: Not on file  Stress: Not on file  Social Connections: Not on file  Intimate Partner Violence: Not on file     (Not in a hospital admission)   Review of Systems  Cardiovascular:  Positive for chest pain (Currently resolved). Negative for dyspnea on exertion, leg swelling, palpitations and syncope.      Physical Exam: Physical Exam Vitals and nursing note reviewed.  Constitutional:      General: She is not in acute distress.    Appearance: She is obese.  Neck:     Vascular: No JVD.  Cardiovascular:     Rate and Rhythm: Normal rate and regular rhythm.     Heart sounds: Normal heart sounds. No murmur heard. Pulmonary:     Effort: Pulmonary effort is normal.     Breath sounds: Normal breath sounds. No wheezing or rales.  Musculoskeletal:  Right lower leg: No edema.     Left lower leg: No edema.        Imaging/tests reviewed and independently interpreted: Lab Results: CBC, BMP, trop. BNP  Cardiac Studies:  Telemetry 12/07/2022: No significant arrhythmia  EKG 12/06/2022: Normal sinus rhythm Left axis deviation Low voltage QRS Cannot rule out Anterior infarct , age undetermined Abnormal ECG When compared with ECG of 22-  Echocardiogram 05/27/2021:  Poor echo window and foreshortened views due to patient body habitus.  Measurements may not be accurate.  Mildly depressed LV systolic function with EF 50%. Left ventricle cavity  is normal in size. Severe concentric hypertrophy of the left ventricle.  Normal global wall motion. Doppler evidence of grade II (pseudonormal)  diastolic dysfunction, elevated LAP.  Calculated EF 50%.  Left atrial cavity is moderately dilated.  Right atrial cavity is mildly dilated.  Right ventricle cavity is moderately dilated. Mildly reduced right  ventricular function.  Mild (Grade I) mitral regurgitation.  Moderate tricuspid regurgitation. Mild pulmonary hypertension. RVSP  measures 39 mmHg.  No significnat change since 06/18/2018.    Assessment & Recommendations:  [name] with nonobstructive CAD, hypertension, hyperlipidemia, type 2 DM, OSA,  IgG kappa monoclonal gammopathy, admitted with chest pain  Chest pain: Recurrent, nitrate responsive chest pain, albeit without any cardiac enzyme elevation. Known CAD with 50% LAD disease noted in 2017. Given patient's recurrent symptoms in spite of optimal antianginal therapy and recurrent ER visits for chest pain, recommend coronary angiography and possible intervention for definitive evaluation. Continue aspirin, heparin, statin, Zetia. Plan to perform coronary angiography later this afternoon.  Patient can eat breakfast. Risks, benefits, alternate options discussed with the patient in detail. Given her renal function has rather improved compared to prior, her risk of CIN is lower than in the past. Will check echocardiogram.  Hypertension: Blood pressure elevated today. On losartan 50 mg daily, hydralazine 25 mg 3 times daily, diltiazem 120 mg daily, Imdur 30 mg daily.  At home. Hold losartan with plans for cath today. Change diltiazem to amlodipine 5 mg daily, and add metoprolol succinate 50 mg daily.  Elevated BNP: Mild vascular congestion on chest x-ray.  Will offer gentle hydration with preparation for cath.  May also need to use Lasix in addition, should she have any breathing difficulty.  Discussed interpretation of tests and management recommendations with the primary team     Nigel Mormon, MD Pager: [phone] Office: [phone]

## 2022-12-08 ENCOUNTER — Encounter (HOSPITAL_COMMUNITY): Payer: Self-pay | Admitting: Cardiology

## 2022-12-08 ENCOUNTER — Observation Stay (HOSPITAL_COMMUNITY): Payer: Medicare Other

## 2022-12-08 DIAGNOSIS — I5032 Chronic diastolic (congestive) heart failure: Secondary | ICD-10-CM | POA: Diagnosis not present

## 2022-12-08 DIAGNOSIS — R079 Chest pain, unspecified: Secondary | ICD-10-CM | POA: Diagnosis not present

## 2022-12-08 DIAGNOSIS — E119 Type 2 diabetes mellitus without complications: Secondary | ICD-10-CM | POA: Diagnosis not present

## 2022-12-08 DIAGNOSIS — D638 Anemia in other chronic diseases classified elsewhere: Secondary | ICD-10-CM | POA: Diagnosis not present

## 2022-12-08 LAB — BASIC METABOLIC PANEL
Anion gap: 14 (ref 5–15)
BUN: 12 mg/dL (ref 8–23)
CO2: 25 mmol/L (ref 22–32)
Calcium: 9.3 mg/dL (ref 8.9–10.3)
Chloride: 95 mmol/L — ABNORMAL LOW (ref 98–111)
Creatinine, Ser: 1.38 mg/dL — ABNORMAL HIGH (ref 0.44–1.00)
GFR, Estimated: 38 mL/min — ABNORMAL LOW (ref >60)
Glucose, Bld: 121 mg/dL — ABNORMAL HIGH (ref 70–99)
Potassium: 4.4 mmol/L (ref 3.5–5.1)
Sodium: 134 mmol/L — ABNORMAL LOW (ref 135–145)

## 2022-12-08 LAB — GLUCOSE, CAPILLARY: Glucose-Capillary: 129 mg/dL — ABNORMAL HIGH (ref 70–99)

## 2022-12-08 LAB — CBC
HCT: 37 % (ref 36.0–46.0)
Hemoglobin: 11.4 g/dL — ABNORMAL LOW (ref 12.0–15.0)
MCH: 26.7 pg (ref 26.0–34.0)
MCHC: 30.8 g/dL (ref 30.0–36.0)
MCV: 86.7 fL (ref 80.0–100.0)
Platelets: 263 10*3/uL (ref 150–400)
RBC: 4.27 MIL/uL (ref 3.87–5.11)
RDW: 14.7 % (ref 11.5–15.5)
WBC: 8.1 10*3/uL (ref 4.0–10.5)
nRBC: 0 % (ref 0.0–0.2)

## 2022-12-08 LAB — HEMOGLOBIN A1C
Hgb A1c MFr Bld: 6.3 % — ABNORMAL HIGH (ref 4.8–5.6)
Mean Plasma Glucose: 134 mg/dL

## 2022-12-08 MED ORDER — METOPROLOL SUCCINATE ER 50 MG PO TB24: 50.0000 mg | ORAL_TABLET | Freq: Every day | ORAL | 3 refills | Status: DC

## 2022-12-08 MED ORDER — AMLODIPINE BESYLATE 5 MG PO TABS: 5.0000 mg | ORAL_TABLET | Freq: Every day | ORAL | 3 refills | Status: DC

## 2022-12-08 MED FILL — Heparin Sodium (Porcine) Inj 1000 Unit/ML: INTRAMUSCULAR | Qty: 10 | Status: AC

## 2022-12-08 NOTE — Progress Notes (Signed)
Heart Failure Navigator Progress Note  Assessed for Heart & Vascular TOC clinic readiness.  Patient does not meet criteria due to [facility] cardiology patient.   Navigator will sign off at this time.    Gita Dilger, BSN, RN Heart Failure Nurse Navigator Secure Chat Only   

## 2022-12-08 NOTE — Discharge Summary (Signed)
Physician Discharge Summary   Patient: Jeanne [name] MRN: [id number] DOB: 1935-09-22  Admit date:     12/06/2022  Discharge date: 12/08/22  Discharge Physician: Alberteen Sam   PCP: Renford Dills, MD     Recommendations at discharge:  Follow up with Cardiology Dr. Rosemary Holms as directed, next Tuesday Dr. Nehemiah Settle:  Please note stopped diltiazem, new amlodipine and metoprolol     Discharge Diagnoses: Principal Problem:   Chest pain Active Problems:   Morbid obesity   Obstructive sleep apnea   Anemia of chronic disease   DM2 (diabetes mellitus, type 2)   Essential hypertension   CKD (chronic kidney disease), stage IV   Hyperlipidemia   Chronic diastolic CHF (congestive heart failure)   GERD (gastroesophageal reflux disease)   Mixed hyperlipidemia      Hospital Course: Jeanne [name] is an 87 y.o. F with CAD no prior PCI, DM, HTN, CKD IV baseline 1.5-1.8, OSA, and morbid obesity and MGUS who presented with atypical chest pain.       * Chest pain Pain onset at rest while laying down to sleep, chest and arm, sharp and severe.  No associated SOB, orthopnea.   Troponins and ECG were unremarkable.  CXR with CM and interstitial opacities.  BNP elevated.   Cardiology consulted and recommended cardiac catheterization which showed no significant change from her nonobstructive disease noted on prior cath.    Chronic diastolic CHF Hypertension Cardiology changed diltiazem to amlodipine and added metoprolol.  They have arranged close follow up.    CKD (chronic kidney disease), stage IV CKD III ruled out Cr stable post procedure.            The [facility] Controlled Substances Registry was reviewed for this patient prior to discharge.   Consultants: Cardiology, Dr. Rosemary Holms Procedures performed:  LHC   Disposition: Home Diet recommendation:  Discharge Diet Orders (From admission, onward)     Start     Ordered   12/08/22 0000  Diet - low  sodium heart healthy        12/08/22 0956             DISCHARGE MEDICATION: Allergies as of 12/08/2022       Reactions   Amoxicillin Nausea Only   Has patient had a PCN reaction causing immediate rash, facial/tongue/throat swelling, SOB or lightheadedness with hypotension: "yes, i was dizzy" Has patient had a PCN reaction causing severe rash involving mucus membranes or skin necrosis: no Did a PCN reaction that required hospitalization : no, called the DR. Did PCN reaction occurring within the last 10 years: unk if all of the above answers are "NO", then may proceed with Cephalosporin use.   Enalapril Maleate    Other reaction(s): angioedema   Invokana [canagliflozin] Nausea Only, Other (See Comments)   Dizziness   Liraglutide    Other reaction(s): Nausea and dizziness   Pioglitazone    Other reaction(s): macular edema   Tramadol Hcl    Other reaction(s): nausea, headache and dizziness        Medication List     STOP taking these medications    diltiazem 120 MG 24 hr capsule Commonly known as: TIAZAC       TAKE these medications    allopurinol 100 MG tablet Commonly known as: ZYLOPRIM Take 100 mg by mouth daily.   amLODipine 5 MG tablet Commonly known as: NORVASC Take 1 tablet (5 mg total) by mouth daily.   atorvastatin 20 MG tablet Commonly known  as: LIPITOR Take 1 tablet (20 mg total) by mouth daily.   [employer] Aspirin Low Dose 81 MG tablet Generic drug: aspirin EC TAKE 1 TABLET (81 MG TOTAL) BY MOUTH DAILY. SWALLOW WHOLE. What changed:  when to take this additional instructions   escitalopram 10 MG tablet Commonly known as: LEXAPRO Take 10 mg by mouth daily.   esomeprazole 20 MG capsule Commonly known as: NEXIUM TAKE 1 CAPSULE (20 MG TOTAL) BY MOUTH DAILY AT 12 NOON.   ezetimibe 10 MG tablet Commonly known as: ZETIA Take 10 mg by mouth daily.   ferrous sulfate 325 (65 FE) MG tablet Take 325 mg by mouth daily with breakfast.   furosemide 20  MG tablet Commonly known as: LASIX TAKE 1 TABLET BY MOUTH EVERY DAY   hydrALAZINE 25 MG tablet Commonly known as: APRESOLINE TAKE 1 TABLET BY MOUTH THREE TIMES A DAY   insulin glargine 100 UNIT/ML injection Commonly known as: LANTUS Inject 65 Units into the skin daily before breakfast.   isosorbide mononitrate 60 MG 24 hr tablet Commonly known as: IMDUR Take 1 tablet (60 mg total) by mouth daily.   latanoprost 0.005 % ophthalmic solution Commonly known as: XALATAN INSTILL 1 DROP IN LEFT EYE NIGHTLY What changed: See the new instructions.   losartan 25 MG tablet Commonly known as: COZAAR TAKE 2 TABLETS BY MOUTH DAILY What changed:  how much to take when to take this   meclizine 12.5 MG tablet Commonly known as: ANTIVERT Take 12.5 mg by mouth 3 (three) times daily as needed for dizziness.   meclizine 25 MG tablet Commonly known as: ANTIVERT TAKE 1 TABLET BY MOUTH 3 TIMES DAILY AS NEEDED FOR DIZZINESS.   metoprolol succinate 50 MG 24 hr tablet Commonly known as: TOPROL-XL Take 1 tablet (50 mg total) by mouth daily. Take with or immediately following a meal.   nitroGLYCERIN 0.4 MG SL tablet Commonly known as: NITROSTAT PLACE 1 TABLET UNDER THE TONGUE AS NEEDED FOR CHEST PAIN. What changed: See the new instructions.   oxybutynin 10 MG 24 hr tablet Commonly known as: DITROPAN-XL Take 10 mg by mouth daily.   REFRESH OP Place 1 drop into both eyes at bedtime.   Vitamin D3 50 MCG (2000 UT) Tabs Generic drug: Cholecalciferol Take 2,000 Units by mouth daily.        Follow-up Information     Renford Dills, MD. Schedule an appointment as soon as possible for a visit in 1 week(s).   Specialty: Internal Medicine Contact information: [address] [place] [address] [phone]                 Discharge Instructions     Diet - low sodium heart healthy   Complete by: As directed    Discharge instructions   Complete by: As directed     **IMPORTANT DISCHARGE INSTRUCTIONS**   From Dr. Maryfrances Bunnell: You were admitted for chest pain  Here, we did heart enzymes (troponins), an electrocardiogram (electrical activity of the heart), a chest x-ray and a heart catheterization, and all of these were reassuring that your symptoms were not actively related to your heart.  I suspect this was a little spasm or possibly something related to heart burn or gas pains.   Dr. Rosemary Holms recommends two changes to your medicines: STOP diltiazem START amlodipine in it's place (These are both blood pressure lowering medicines)  ALSO, start metoprolol XL once daily  Continue your other medicines Go see Dr. Rosemary Holms next  Tuesday as directed   Increase activity slowly   Complete by: As directed        Discharge Exam: Filed Weights   12/06/22 1728 12/08/22 0440  Weight: (!) 137 kg 135.6 kg    General: Pt is alert, awake, not in acute distress Cardiovascular: RRR, nl S1-S2, no murmurs appreciated.   No LE edema.   Respiratory: Normal respiratory rate and rhythm.  CTAB without rales or wheezes. Abdominal: Abdomen soft and non-tender.  No distension or HSM.  Right groin site normal without mass or tenderness Neuro/Psych: Strength symmetric in upper and lower extremities.  Judgment and insight appear normal.   Condition at discharge: good  The results of significant diagnostics from this hospitalization (including imaging, microbiology, ancillary and laboratory) are listed below for reference.   Imaging Studies: CARDIAC CATHETERIZATION  Result Date: 12/07/2022 Images from the original result were not included. LM: Normal LAD: Mid 40% disease         Ostial diag 2 70% stenosis Lcx: No significant disease RCA: Diffuse prox-mid 30% disease LVEDP normal Moderate nonobstructive coronary artery disease Continue medical management Elder NegusManish J Patwardhan, MD Pager: [phone] Office: [phone]  DG Chest 2 View  Result Date:  12/06/2022 CLINICAL DATA:  Chest pain EXAM: CHEST - 2 VIEW COMPARISON:  X-ray 11/24/2022 and older FINDINGS: Enlarged cardiopericardial silhouette with some vascular congestion. No consolidation, pneumothorax or effusion. No edema. Films are under penetrated. Degenerative changes of the spine. Motion on lateral view. IMPRESSION: Enlarged cardiopericardial silhouette with vascular congestion. Electronically Signed   By: Karen KaysAshok  Gupta M.D.   On: 12/06/2022 17:50   DG Chest 2 View  Result Date: 11/24/2022 CLINICAL DATA:  Chest pain. EXAM: CHEST - 2 VIEW COMPARISON:  05/11/2022. FINDINGS: Unchanged mild central pulmonary venous congestion and cardiomegaly. No focal airspace opacity. Stable mediastinal contours with atherosclerotic calcifications and tortuosity of the aorta. No pleural effusion or pneumothorax. Visualized bones and upper abdomen are unremarkable. IMPRESSION: 1. Unchanged mild central pulmonary venous congestion and cardiomegaly. No focal airspace opacity. 2.  Aortic Atherosclerosis (ICD10-I70.0). Electronically Signed   By: Orvan FalconerWalter  Wiggins M.D.   On: 11/24/2022 09:46    Microbiology: Results for orders placed or performed during the hospital encounter of 10/10/19  Respiratory Panel by RT PCR (Flu A&B, Covid) - Nasopharyngeal Swab     Status: None   Collection Time: 10/11/19  1:34 AM   Specimen: Nasopharyngeal Swab  Result Value Ref Range Status   SARS Coronavirus 2 by RT PCR NEGATIVE NEGATIVE Final    Comment: (NOTE) SARS-CoV-2 target nucleic acids are NOT DETECTED. The SARS-CoV-2 RNA is generally detectable in upper respiratoy specimens during the acute phase of infection. The lowest concentration of SARS-CoV-2 viral copies this assay can detect is 131 copies/mL. A negative result does not preclude SARS-Cov-2 infection and should not be used as the sole basis for treatment or other patient management decisions. A negative result may occur with  improper specimen collection/handling,  submission of specimen other than nasopharyngeal swab, presence of viral mutation(s) within the areas targeted by this assay, and inadequate number of viral copies (<131 copies/mL). A negative result must be combined with clinical observations, patient history, and epidemiological information. The expected result is Negative. Fact Sheet for Patients:  https://www.moore.com/https://www.fda.gov/media/142436/download Fact Sheet for Healthcare Providers:  https://www.young.biz/https://www.fda.gov/media/142435/download This test is not yet ap proved or cleared by the [place] FDA and  has been authorized for detection and/or diagnosis of SARS-CoV-2 by FDA under an Emergency Use Authorization (EUA). This EUA  will remain  in effect (meaning this test can be used) for the duration of the COVID-19 declaration under Section 564(b)(1) of the Act, 21 U.S.C. section 360bbb-3(b)(1), unless the authorization is terminated or revoked sooner.    Influenza A by PCR NEGATIVE NEGATIVE Final   Influenza B by PCR NEGATIVE NEGATIVE Final    Comment: (NOTE) The Xpert Xpress SARS-CoV-2/FLU/RSV assay is intended as an aid in  the diagnosis of influenza from Nasopharyngeal swab specimens and  should not be used as a sole basis for treatment. Nasal washings and  aspirates are unacceptable for Xpert Xpress SARS-CoV-2/FLU/RSV  testing. Fact Sheet for Patients: https://www.moore.com/ Fact Sheet for Healthcare Providers: https://www.young.biz/ This test is not yet approved or cleared by the [place] FDA and  has been authorized for detection and/or diagnosis of SARS-CoV-2 by  FDA under an Emergency Use Authorization (EUA). This EUA will remain  in effect (meaning this test can be used) for the duration of the  Covid-19 declaration under Section 564(b)(1) of the Act, 21  U.S.C. section 360bbb-3(b)(1), unless the authorization is  terminated or revoked. Performed at [facility], [address]  [place], [place] [address]   Blood Culture (routine x 2)     Status: Abnormal   Collection Time: 10/11/19  2:00 AM   Specimen: BLOOD  Result Value Ref Range Status   Specimen Description BLOOD LEFT ANTECUBITAL  Final   Special Requests   Final    BOTTLES DRAWN AEROBIC AND ANAEROBIC Blood Culture adequate volume   Culture  Setup Time   Final    GRAM POSITIVE RODS ANAEROBIC BOTTLE ONLY CRITICAL RESULT CALLED TO, READ BACK BY AND VERIFIED WITH: PHARMD C. AMEND [id number] FCP    Culture (A)  Final    PROPIONIBACTERIUM ACNES Standardized susceptibility testing for this organism is not available. Performed at [facility], [address], [place], [place] [address]    Report Status 10/17/2019 FINAL  Final  Blood Culture (routine x 2)     Status: None   Collection Time: 10/11/19  2:00 AM   Specimen: BLOOD  Result Value Ref Range Status   Specimen Description BLOOD RIGHT ANTECUBITAL  Final   Special Requests   Final    BOTTLES DRAWN AEROBIC AND ANAEROBIC Blood Culture adequate volume   Culture   Final    NO GROWTH 5 DAYS Performed at [facility], [address] [place], [place] [address]    Report Status 10/16/2019 FINAL  Final  Culture, blood (routine x 2)     Status: None   Collection Time: 10/11/19  4:10 PM   Specimen: BLOOD LEFT HAND  Result Value Ref Range Status   Specimen Description BLOOD LEFT HAND  Final   Special Requests   Final    BOTTLES DRAWN AEROBIC ONLY Blood Culture results may not be optimal due to an inadequate volume of blood received in culture bottles   Culture   Final    NO GROWTH 5 DAYS Performed at [facility], [address] [place], [place] [address]    Report Status 10/16/2019 FINAL  Final  Culture, blood (routine x 2)     Status: None   Collection Time: 10/11/19  4:11 PM   Specimen: BLOOD LEFT HAND  Result Value Ref Range Status   Specimen Description BLOOD LEFT HAND  Final   Special Requests   Final    BOTTLES DRAWN AEROBIC  ONLY Blood Culture results may  not be optimal due to an inadequate volume of blood received in culture bottles   Culture   Final    NO GROWTH 5 DAYS Performed at [facility], [address] [place], [place] [address]    Report Status 10/16/2019 FINAL  Final  SARS CORONAVIRUS 2 (TAT 6-24 HRS) Nasopharyngeal Nasopharyngeal Swab     Status: Abnormal   Collection Time: 10/13/19 11:50 AM   Specimen: Nasopharyngeal Swab  Result Value Ref Range Status   SARS Coronavirus 2 POSITIVE (A) NEGATIVE Final    Comment: RESULT CALLED TO, READ BACK BY AND VERIFIED WITH: N.GOMEZ RN 1716 10/13/19 MCCORMICK K (NOTE) SARS-CoV-2 target nucleic acids are DETECTED. The SARS-CoV-2 RNA is generally detectable in upper and lower respiratory specimens during the acute phase of infection. Positive results are indicative of the presence of SARS-CoV-2 RNA. Clinical correlation with patient history and other diagnostic information is  necessary to determine patient infection status. Positive results do not rule out bacterial infection or co-infection with other viruses.  The expected result is Negative. Fact Sheet for Patients: HairSlick.nohttps://www.fda.gov/media/138098/download Fact Sheet for Healthcare Providers: quierodirigir.comhttps://www.fda.gov/media/138095/download This test is not yet approved or cleared by the [place] FDA and  has been authorized for detection and/or diagnosis of SARS-CoV-2 by FDA under an Emergency Use Authorization (EUA). This EUA will remain  in effect (meaning this test can be used) for th e duration of the COVID-19 declaration under Section 564(b)(1) of the Act, 21 U.S.C. section 360bbb-3(b)(1), unless the authorization is terminated or revoked sooner. Performed at [facility], [address] [place], [place] [address]     Labs: CBC: Recent Labs  Lab 12/06/22 1724 12/07/22 0404 12/07/22 1834 12/08/22 0136  WBC 6.3 6.6 6.2 8.1  NEUTROABS  --  3.4  --   --   HGB 10.0* 10.0*  10.6* 11.4*  HCT 33.3* 32.2* 33.6* 37.0  MCV 88.1 86.3 85.7 86.7  PLT 270 246 240 263   Basic Metabolic Panel: Recent Labs  Lab 12/06/22 1724 12/07/22 0404 12/07/22 1834 12/08/22 0136  NA 139 138  --  134*  K 3.7 3.5  --  4.4  CL 100 99  --  95*  CO2 27 27  --  25  GLUCOSE 91 69*  --  121*  BUN 12 12  --  12  CREATININE 1.56* 1.44* 1.37* 1.38*  CALCIUM 9.2 9.0  --  9.3  MG  --  2.0 1.8  --    Liver Function Tests: Recent Labs  Lab 12/06/22 1807 12/07/22 0404  AST 15 14*  ALT 9 6  ALKPHOS 74 72  BILITOT 0.5 0.6  PROT 6.6 6.3*  ALBUMIN 2.9* 2.7*   CBG: Recent Labs  Lab 12/07/22 0753 12/07/22 1158 12/07/22 2126 12/08/22 0802  GLUCAP 61* 97 102* 129*    Discharge time spent: approximately 25 minutes spent on discharge counseling, evaluation of patient on day of discharge, and coordination of discharge planning with nursing, social work, pharmacy and case management  Signed: Alberteen Samhristopher P Huston Stonehocker, MD [facility] 12/08/2022

## 2022-12-09 LAB — LIPOPROTEIN A (LPA): Lipoprotein (a): 142.8 nmol/L — ABNORMAL HIGH (ref <75.0)

## 2022-12-11 ENCOUNTER — Ambulatory Visit: Payer: Medicare Other | Admitting: Neurology

## 2022-12-11 ENCOUNTER — Encounter: Payer: Self-pay | Admitting: Neurology

## 2022-12-11 VITALS — BP 116/67 | HR 127 | Ht 63.0 in | Wt 298.9 lb

## 2022-12-11 DIAGNOSIS — R42 Dizziness and giddiness: Secondary | ICD-10-CM | POA: Diagnosis not present

## 2022-12-11 DIAGNOSIS — F09 Unspecified mental disorder due to known physiological condition: Secondary | ICD-10-CM

## 2022-12-11 MED ORDER — ESCITALOPRAM OXALATE 10 MG PO TABS: 20.0000 mg | ORAL_TABLET | Freq: Every day | ORAL | 2 refills | Status: DC

## 2022-12-11 NOTE — Progress Notes (Unsigned)
[facility]  PATIENT: Jeanne [name] DOB: May 18, 1936  REQUESTING CLINICIAN: Renford Dills, MD HISTORY FROM: Patient and daughter REASON FOR VISIT: Dizziness/Cognitive impairment    HISTORICAL  CHIEF COMPLAINT:  Chief Complaint  Patient presents with   Follow-up    Rm 15, daughter present  Was admitted to [facility] on 12/06/22 discharged on4/5/24 chest pain. Pt stated that they are in active chest pain (4/100 Dizziness/vertigo (ongoing or several months), last fall was 2 months ago was coming here for therapy (2 months completed then stopped per pt's choice)    INTERVAL HISTORY 12/11/2022:  Patient presents today for follow-up, she is accompanied by her daughter.  Last visit was in November.  At that time we obtained a brain MRI which showed no acute abnormality.  She did started physical therapy but self discontinued.  Daughter stated that since patient husband died, she has been down. Patient recognized that sometimes, she does not want to do anything. She was recently admitted to the hospital for chest pain.  She does report that she is still experiencing dizziness on standing and with walking.  She is using assistive device.  INTERVAL HISTORY 07/19/2022 Patient presents today for follow-up, she is accompanied by her daughter.  Since last visit a month ago, she has been complaining of dizziness.  Patient reports the dizziness is constant but varies in intensity.  Dizziness is worse with head movement and also worse upon standing and walking.  She denies any falls, denies any headaches.  She describes dizziness as woozy feeling, lightheadedness.  Whenever she had these events, she has to sit and wait for the episode to pass.  She reports that meclizine has been helping if she takes it.   INTERVAL HISTORY 06/21/22: Patient presents today for follow-up, she is accompanied by her daughter.  Last visit was in August 10.  At that time I did start her for on Aricept for  mild cognitive impairment.  She could not tolerate the medication due to vivid dreams therefore it was discontinued.  She still complains of dizziness that she describes as wooziness with position change.  There is also increased anxiety reported by patient and family.  She does not want to be alone.  Other than that there has not been any worsening of her mental status or cognition.  Overall she is stable.   HISTORY OF PRESENT ILLNESS:  This is a 87 year old woman past medical history of obesity, hypertension, hyperlipidemia, diabetes, glaucoma, CAD and CKD who is presenting for dizziness and daughter also reports cognitive impairment.  In terms of her dizziness, patient reports on July 20 while taking a shower she bent down to pick up the soap and when she got up she felt extreme dizziness that she have to sit down.  Her symptoms did not improve therefore she presented to the ED.  In the ED she had a head CT which was negative for any acute stroke and patient was discharged home with dizziness and Meclizine.  She reports dizziness at that time was positional, worse with head movement.  She has been on meclizine 3 times daily since July 20, reports improvement of her symptoms but stated that she will feel woozy when moving her head.  Denies any fall, denies any hearing loss denies any worsening tinnitus.    Her daughter who is present on the visit has concerns about her memory.  She reports that patient is forgetful, she forgets about recent conversation, but she cannot provide good  recall of her childhood memory and she also noted that she is repetitive.  Patient is not driving because she got lost while driving and since then she has decided not to drive.  Patient currently she is living with both of her daughters, she is not cooking or cleaning but states that she will walk to one room and forget the reason why she was in the room in the first place.  Daughters are handling her bills but she is able to  take care of herself, she is able to bathe herself, dress herself without any help.  She does walk with the cane.   She does complaints of changes in her vision but has an appointment with ophthalmologist in a couple months    OTHER MEDICAL CONDITIONS: Diabetes, Hypertension, Hypertension, Glaucoma, CKD, CAD    REVIEW OF SYSTEMS: Full 14 system review of systems performed and negative with exception of: as noted in the HPI   ALLERGIES: Allergies  Allergen Reactions   Amoxicillin Nausea Only    Has patient had a PCN reaction causing immediate rash, facial/tongue/throat swelling, SOB or lightheadedness with hypotension: "yes, i was dizzy" Has patient had a PCN reaction causing severe rash involving mucus membranes or skin necrosis: no Did a PCN reaction that required hospitalization : no, called the DR. Did PCN reaction occurring within the last 10 years: unk if all of the above answers are "NO", then may proceed with Cephalosporin use.    Enalapril Maleate     Other reaction(s): angioedema   Invokana [Canagliflozin] Nausea Only and Other (See Comments)    Dizziness    Liraglutide     Other reaction(s): Nausea and dizziness   Pioglitazone     Other reaction(s): macular edema   Tramadol Hcl     Other reaction(s): nausea, headache and dizziness    HOME MEDICATIONS: Outpatient Medications Prior to Visit  Medication Sig Dispense Refill   allopurinol (ZYLOPRIM) 100 MG tablet Take 100 mg by mouth daily.     amLODipine (NORVASC) 5 MG tablet Take 1 tablet (5 mg total) by mouth daily. 30 tablet 3   aspirin EC ([employer] ASPIRIN LOW DOSE) 81 MG tablet TAKE 1 TABLET (81 MG TOTAL) BY MOUTH DAILY. SWALLOW WHOLE. (Patient taking differently: Take 81 mg by mouth daily.) 90 tablet 0   atorvastatin (LIPITOR) 20 MG tablet Take 1 tablet (20 mg total) by mouth daily. 90 tablet 3   esomeprazole (NEXIUM) 20 MG capsule TAKE 1 CAPSULE (20 MG TOTAL) BY MOUTH DAILY AT 12 NOON. 90 capsule 1   ezetimibe (ZETIA)  10 MG tablet Take 10 mg by mouth daily.     ferrous sulfate 325 (65 FE) MG tablet Take 325 mg by mouth daily with breakfast.      furosemide (LASIX) 20 MG tablet TAKE 1 TABLET BY MOUTH EVERY DAY (Patient taking differently: Take 20 mg by mouth daily.) 90 tablet 1   hydrALAZINE (APRESOLINE) 25 MG tablet TAKE 1 TABLET BY MOUTH THREE TIMES A DAY (Patient taking differently: Take 25 mg by mouth 3 (three) times daily.) 270 tablet 3   insulin glargine (LANTUS) 100 UNIT/ML injection Inject 65 Units into the skin daily before breakfast.     isosorbide mononitrate (IMDUR) 60 MG 24 hr tablet Take 1 tablet (60 mg total) by mouth daily. 30 tablet 0   latanoprost (XALATAN) 0.005 % ophthalmic solution INSTILL 1 DROP IN LEFT EYE NIGHTLY (Patient taking differently: Place 1 drop into the left eye at bedtime.) 7.5 mL  12   losartan (COZAAR) 25 MG tablet TAKE 2 TABLETS BY MOUTH DAILY (Patient taking differently: Take 25 mg by mouth 2 (two) times daily.) 180 tablet 0   meclizine (ANTIVERT) 12.5 MG tablet Take 12.5 mg by mouth 3 (three) times daily as needed for dizziness.     meclizine (ANTIVERT) 25 MG tablet TAKE 1 TABLET BY MOUTH 3 TIMES DAILY AS NEEDED FOR DIZZINESS. 30 tablet 0   metoprolol succinate (TOPROL-XL) 50 MG 24 hr tablet Take 1 tablet (50 mg total) by mouth daily. Take with or immediately following a meal. 30 tablet 3   nitroGLYCERIN (NITROSTAT) 0.4 MG SL tablet PLACE 1 TABLET UNDER THE TONGUE AS NEEDED FOR CHEST PAIN. (Patient taking differently: Place 0.4 mg under the tongue every 5 (five) minutes as needed for chest pain.) 25 tablet 3   oxybutynin (DITROPAN-XL) 10 MG 24 hr tablet Take 10 mg by mouth daily.     Polyvinyl Alcohol-Povidone (REFRESH OP) Place 1 drop into both eyes at bedtime.     escitalopram (LEXAPRO) 10 MG tablet Take 10 mg by mouth daily.     Cholecalciferol (VITAMIN D3) 50 MCG (2000 UT) TABS Take 2,000 Units by mouth daily.      No facility-administered medications prior to visit.     PAST MEDICAL HISTORY: Past Medical History:  Diagnosis Date   Anemia    Arthritis    Carotid arterial disease    COVID-19    Diabetes mellitus    Hyperlipidemia    Hypertension    Migraine    Obesity    Reflux    Sleep apnea     PAST SURGICAL HISTORY: Past Surgical History:  Procedure Laterality Date   CARDIAC CATHETERIZATION N/A 10/12/2015   Procedure: Left Heart Cath and Coronary Angiography;  Surgeon: Lyn Records, MD;  Location: [facility] INVASIVE CV LAB;  Service: Cardiovascular;  Laterality: N/A;   CHOLECYSTECTOMY  50 yrs ago   JOINT REPLACEMENT Right 2005   knee   LEFT HEART CATH AND CORONARY ANGIOGRAPHY N/A 12/07/2022   Procedure: LEFT HEART CATH AND CORONARY ANGIOGRAPHY;  Surgeon: Elder Negus, MD;  Location: [facility] INVASIVE CV LAB;  Service: Cardiovascular;  Laterality: N/A;   WISDOM TOOTH EXTRACTION      FAMILY HISTORY: Family History  Problem Relation Age of Onset   Heart attack Father    Heart attack Mother     SOCIAL HISTORY: Social History   Socioeconomic History   Marital status: Widowed    Spouse name: Not on file   Number of children: 2   Years of education: Not on file   Highest education level: Not on file  Occupational History   Not on file  Tobacco Use   Smoking status: Never   Smokeless tobacco: Never  Vaping Use   Vaping Use: Never used  Substance and Sexual Activity   Alcohol use: Never   Drug use: No   Sexual activity: Not on file  Other Topics Concern   Not on file  Social History Narrative   Not on file   Social Determinants of Health   Financial Resource Strain: Not on file  Food Insecurity: Not on file  Transportation Needs: Not on file  Physical Activity: Not on file  Stress: Not on file  Social Connections: Not on file  Intimate Partner Violence: Not on file    PHYSICAL EXAM  GENERAL EXAM/CONSTITUTIONAL: Vitals:  Vitals:   12/11/22 1322  BP: 116/67  Pulse: (!) 127  Weight: 298  lb 15.1 oz (135.6 kg)   Height: 5\' 3"  (1.6 m)    Body mass index is 52.96 kg/m. Wt Readings from Last 3 Encounters:  12/11/22 298 lb 15.1 oz (135.6 kg)  12/08/22 299 lb (135.6 kg)  10/27/22 (!) 310 lb (140.6 kg)   Patient is in no distress; well developed, nourished and groomed; neck is supple.   EYES: Visual fields full to confrontation, Extraocular movements intacts,   MUSCULOSKELETAL: Gait, strength, tone, movements noted in Neurologic exam below  NEUROLOGIC: MENTAL STATUS:      No data to display            04/13/2022   11:21 AM  Montreal Cognitive Assessment   Visuospatial/ Executive (0/5) 3  Naming (0/3) 2  Attention: Read list of digits (0/2) 1  Attention: Read list of letters (0/1) 1  Attention: Serial 7 subtraction starting at 100 (0/3) 3  Language: Repeat phrase (0/2) 2  Language : Fluency (0/1) 1  Abstraction (0/2) 2  Delayed Recall (0/5) 0  Orientation (0/6) 6  Total 21  Adjusted Score (based on education) 21    CRANIAL NERVE:  2nd, 3rd, 4th, 6th - visual fields full to confrontation, extraocular muscles intact, no nystagmus 5th - facial sensation symmetric 7th - facial strength symmetric 8th - hearing intact 9th - palate elevates symmetrically, uvula midline 11th - shoulder shrug symmetric 12th - tongue protrusion midline  MOTOR:  normal bulk and tone, full strength in the BUE, BLE  SENSORY:  normal and symmetric to light touch  COORDINATION:  finger-nose-finger, fine finger movements normal  GAIT/STATION:  In a wheelchair    DIAGNOSTIC DATA (LABS, IMAGING, TESTING) - I reviewed patient records, labs, notes, testing and imaging myself where available.  Lab Results  Component Value Date   WBC 8.1 12/08/2022   HGB 11.4 (L) 12/08/2022   HCT 37.0 12/08/2022   MCV 86.7 12/08/2022   PLT 263 12/08/2022      Component Value Date/Time   NA 134 (L) 12/08/2022 0136   K 4.4 12/08/2022 0136   CL 95 (L) 12/08/2022 0136   CO2 25 12/08/2022 0136   GLUCOSE 121  (H) 12/08/2022 0136   BUN 12 12/08/2022 0136   CREATININE 1.38 (H) 12/08/2022 0136   CREATININE 1.63 (H) 01/27/2020 1044   CALCIUM 9.3 12/08/2022 0136   PROT 6.3 (L) 12/07/2022 0404   ALBUMIN 2.7 (L) 12/07/2022 0404   AST 14 (L) 12/07/2022 0404   AST 12 (L) 01/27/2020 1044   ALT 6 12/07/2022 0404   ALT 9 01/27/2020 1044   ALKPHOS 72 12/07/2022 0404   BILITOT 0.6 12/07/2022 0404   BILITOT 0.3 01/27/2020 1044   GFRNONAA 38 (L) 12/08/2022 0136   GFRNONAA 29 (L) 01/27/2020 1044   GFRAA 34 (L) 01/27/2020 1044   Lab Results  Component Value Date   CHOL 116 12/07/2022   HDL 39 (L) 12/07/2022   LDLCALC 62 12/07/2022   TRIG 73 12/07/2022   CHOLHDL 3.0 12/07/2022   Lab Results  Component Value Date   HGBA1C 6.3 (H) 12/07/2022   Lab Results  Component Value Date   VITAMINB12 590 04/13/2022   Lab Results  Component Value Date   TSH 2.400 12/07/2022    Head CT 03/23/22 No acute intracranial findings are seen in noncontrast CT brain. Atrophy. Small-vessel disease   MRI Brain 08/06/2022 Multiple single and confluent T2/FLAIR hyperintense foci in the cerebral hemispheres in a pattern most consistent with moderate chronic microvascular ischemic changes.  A focus is also noted in the pons.  There has been some progression compared to the 2017 MRI. Mild generalized cortical atrophy, progressed since the 2017 MRI and stable compared to the 2023 CT scan. Single chronic microhemorrhage in the right hemisphere is stable compared to the 2017 MRI. Mild chronic sphenoid sinusitis The internal auditory canals appeared normal on this noncontrast study.   The pituitary gland has a reduced height within an enlarged sella turcica, unchanged compared to the 2017 MRI.  A partially empty sella is nonspecific and could be incidental or due to elevated intracranial pressure.   No acute finding   ASSESSMENT AND PLAN  87 y.o. year old female with history of obesity, hypertension, hyperlipidemia,  diabetes, glaucoma, CAD and CKD and MCI who is presenting for follow up for her dizziness.  She started physical therapy at the [facility] but self discontinued.  She was recently admitted to the hospital for chest pain, she did have changes in the medication, her diltiazem was discontinued patient was started on metoprolol and amlodipine.  On today's evaluation her heart rate is elevated.  She has an appointment with cardiologist this week.  When it comes to the dizziness, she is on meclizine as needed.  Recent MRI did not show any acute  abnormality that can explain her dizziness. Advised patient to take the meclizine only when symptomatic.  Also encouraged her to restart physical therapy.  When it comes to patient feeling down, not having any energy, she is already on Lexapro 10 mg daily, will increase to 20 mg.  Continue to follow with PCP and return as needed.   1. Dizziness   2. Mild cognitive disorder     Patient Instructions  Continue current medications Restart physical therapy Use meclizine when symptomatic Continue to follow with PCP return as needed  No orders of the defined types were placed in this encounter.   Meds ordered this encounter  Medications   escitalopram (LEXAPRO) 10 MG tablet    Sig: Take 2 tablets (20 mg total) by mouth daily.    Dispense:  180 tablet    Refill:  2    Return if symptoms worsen or fail to improve.  I have spent a total of 30 minutes dedicated to this patient today, preparing to see patient, performing a medically appropriate examination and evaluation, ordering tests and/or medications and procedures, and counseling and educating the patient/family/caregiver; independently interpreting result and communicating results to the family/patient/caregiver; and documenting clinical information in the electronic medical record.   Windell Norfolk, MD 12/12/2022, 10:18 AM  [facility] [address] [place], [place] [address] [phone]

## 2022-12-12 ENCOUNTER — Ambulatory Visit: Payer: Medicare Other | Admitting: Cardiology

## 2022-12-12 ENCOUNTER — Encounter: Payer: Self-pay | Admitting: Neurology

## 2022-12-12 ENCOUNTER — Encounter: Payer: Self-pay | Admitting: Cardiology

## 2022-12-12 VITALS — BP 130/60 | HR 74 | Resp 16 | Ht 63.0 in | Wt 303.0 lb

## 2022-12-12 DIAGNOSIS — I25118 Atherosclerotic heart disease of native coronary artery with other forms of angina pectoris: Secondary | ICD-10-CM

## 2022-12-12 DIAGNOSIS — E782 Mixed hyperlipidemia: Secondary | ICD-10-CM

## 2022-12-12 NOTE — Progress Notes (Signed)
Follow up visit  Subjective:   Jeanne [name], female    DOB: 04-25-1936, 87 y.o.   MRN: [id number]     HPI   Chief Complaint  Patient presents with   Coronary Artery Disease   Chest Pain   Follow-up    1-72 week    87 year old African-American female with nonobstructive CAD, hypertension, hyperlipidemia, type 2 DM, OSA,  IgG kappa monoclonal gammopathy   Patient continues to have constant chest pain symptoms. She has had a lot of anxiety related to deaths in family. Separately, she reports that her "stool is black and urine is golden yellow".   Current Outpatient Medications:    allopurinol (ZYLOPRIM) 100 MG tablet, Take 100 mg by mouth daily., Disp: , Rfl:    amLODipine (NORVASC) 5 MG tablet, Take 1 tablet (5 mg total) by mouth daily., Disp: 30 tablet, Rfl: 3   aspirin EC ([employer] ASPIRIN LOW DOSE) 81 MG tablet, TAKE 1 TABLET (81 MG TOTAL) BY MOUTH DAILY. SWALLOW WHOLE. (Patient taking differently: Take 81 mg by mouth daily.), Disp: 90 tablet, Rfl: 0   atorvastatin (LIPITOR) 20 MG tablet, Take 1 tablet (20 mg total) by mouth daily., Disp: 90 tablet, Rfl: 3   escitalopram (LEXAPRO) 10 MG tablet, Take 2 tablets (20 mg total) by mouth daily., Disp: 180 tablet, Rfl: 2   esomeprazole (NEXIUM) 20 MG capsule, TAKE 1 CAPSULE (20 MG TOTAL) BY MOUTH DAILY AT 12 NOON., Disp: 90 capsule, Rfl: 1   ezetimibe (ZETIA) 10 MG tablet, Take 10 mg by mouth daily., Disp: , Rfl:    ferrous sulfate 325 (65 FE) MG tablet, Take 325 mg by mouth daily with breakfast. , Disp: , Rfl:    furosemide (LASIX) 20 MG tablet, TAKE 1 TABLET BY MOUTH EVERY DAY (Patient taking differently: Take 20 mg by mouth daily.), Disp: 90 tablet, Rfl: 1   hydrALAZINE (APRESOLINE) 25 MG tablet, TAKE 1 TABLET BY MOUTH THREE TIMES A DAY (Patient taking differently: Take 25 mg by mouth 3 (three) times daily.), Disp: 270 tablet, Rfl: 3   insulin glargine (LANTUS) 100 UNIT/ML injection, Inject 65 Units into the skin daily before  breakfast., Disp: , Rfl:    isosorbide mononitrate (IMDUR) 60 MG 24 hr tablet, Take 1 tablet (60 mg total) by mouth daily., Disp: 30 tablet, Rfl: 0   latanoprost (XALATAN) 0.005 % ophthalmic solution, INSTILL 1 DROP IN LEFT EYE NIGHTLY (Patient taking differently: Place 1 drop into the left eye at bedtime.), Disp: 7.5 mL, Rfl: 12   losartan (COZAAR) 25 MG tablet, TAKE 2 TABLETS BY MOUTH DAILY (Patient taking differently: Take 25 mg by mouth 2 (two) times daily.), Disp: 180 tablet, Rfl: 0   meclizine (ANTIVERT) 12.5 MG tablet, Take 12.5 mg by mouth 3 (three) times daily as needed for dizziness., Disp: , Rfl:    meclizine (ANTIVERT) 25 MG tablet, TAKE 1 TABLET BY MOUTH 3 TIMES DAILY AS NEEDED FOR DIZZINESS., Disp: 30 tablet, Rfl: 0   metoprolol succinate (TOPROL-XL) 50 MG 24 hr tablet, Take 1 tablet (50 mg total) by mouth daily. Take with or immediately following a meal., Disp: 30 tablet, Rfl: 3   nitroGLYCERIN (NITROSTAT) 0.4 MG SL tablet, PLACE 1 TABLET UNDER THE TONGUE AS NEEDED FOR CHEST PAIN. (Patient taking differently: Place 0.4 mg under the tongue every 5 (five) minutes as needed for chest pain.), Disp: 25 tablet, Rfl: 3   oxybutynin (DITROPAN-XL) 10 MG 24 hr tablet, Take 10 mg by mouth daily., Disp: ,  Rfl:    Polyvinyl Alcohol-Povidone (REFRESH OP), Place 1 drop into both eyes at bedtime., Disp: , Rfl:     Cardiovascular & other pertient studies:  EKG 10/27/2022: Sinus rhythm 78 bpm LAFB Occasional ectopic ventricular beat   Nonspecific T-abnormality  Coronary angiography 12/07/2022: LM: Normal LAD: Mid 40% disease         Ostial diag 2 70% stenosis Lcx: No significant disease RCA: Diffuse prox-mid 30% disease   LVEDP normal   Moderate nonobstructive coronary artery disease Continue medical management   Echocardiogram 05/27/2021:  Poor echo window and foreshortened views due to patient body habitus.  Measurements may not be accurate.  Mildly depressed LV systolic function with  EF 50%. Left ventricle cavity  is normal in size. Severe concentric hypertrophy of the left ventricle.  Normal global wall motion. Doppler evidence of grade II (pseudonormal)  diastolic dysfunction, elevated LAP. Calculated EF 50%.  Left atrial cavity is moderately dilated.  Right atrial cavity is mildly dilated.  Right ventricle cavity is moderately dilated. Mildly reduced right  ventricular function.  Mild (Grade I) mitral regurgitation.  Moderate tricuspid regurgitation. Mild pulmonary hypertension. RVSP  measures 39 mmHg.  No significnat change since 06/18/2018.  CT Chest 06/09/2020: No evidence of parenchymal nodule to correspond with the findings of recent chest x-ray. The finding seen on recent chest x-ray are related to tortuosity of the vasculature as well exuberant degenerative changes in the thoracic spine and ribcage.   Small sliding-type hiatal hernia.   Mild thickening of the pericardium greater than that seen on the prior exam which may represent a small effusion. The need for further follow-up can be determined on a clinical basis.   Aortic Atherosclerosis (ICD10-I70.0).  EKG 06/03/2020: Sinus rhythm 82 bpm  Left anterior fascicular block Nonspecific T-abnormality  Recent labs: 12/08/2022: Glucose 121, BUN/Cr 12/1.38. EGFR 38. Na/K 134/4.4. Rest of the CMP normal H/H 11/37. MCV 86. Platelets 263 HbA1C 6.3% Chol 116, TG 73, HDL 39, LDL 62 Lipoprotein (a) 142 TSH 2.4 normal  10/04/2022: Glucose 174, BUN/Cr 21/1.66. EGFR 30. Na/K 140/4.3. Rest of the CMP normal H/H 10/32. MCV 85. Platelets 257 HbA1C 6.1% Chol 210, TG 126, HDL 42, LDL 145   Review of Systems  Cardiovascular:  Negative for chest pain, dyspnea on exertion, leg swelling, palpitations and syncope.  Neurological:        Burning sensation in feet         Vitals:   12/12/22 1329  BP: 130/60  Pulse: 74  Resp: 16  SpO2: 93%     Body mass index is 53.67 kg/m. Filed Weights   12/12/22  1329  Weight: (!) 303 lb (137.4 kg)     Objective:   Physical Exam Vitals and nursing note reviewed.  Constitutional:      General: She is not in acute distress.    Appearance: She is obese.  Neck:     Vascular: No JVD.  Cardiovascular:     Rate and Rhythm: Normal rate and regular rhythm.     Pulses:          Dorsalis pedis pulses are 1+ on the right side and 1+ on the left side.       Posterior tibial pulses are 1+ on the right side and 1+ on the left side.     Heart sounds: Normal heart sounds. No murmur heard. Pulmonary:     Effort: Pulmonary effort is normal.     Breath sounds: Normal breath sounds. No  wheezing or rales.  Musculoskeletal:     Right lower leg: No edema.     Left lower leg: No edema.           Assessment & Recommendations:   87 year old African-American female with nonobstructive CAD, hypertension, hyperlipidemia, type 2 DM, OSA,  IgG kappa monoclonal gammopathy   CAD: Mild to moderate, nonobstructive (cath 12/2022). Her constant chest pressure is unlikely to be angina. I reckon stress is playing a role in her symptoms. Continue aspirin, statin, amlodipine 5 mg daily, Imdur 60 mg daily, metoprolol succinate 50 mg daily.  Recent black discoloration of stools. Hb was stable during recent hospitalization. Recommend following up with PCP in case she needs GI workup. Okay to hold Aspirin if and when needed for GI procedure.  Mixed hyperlipidemia: Chol 116, TG 73, HDL 39, LDL 62 (4.2024) Lipoprotein (a) 142 Continue Lipitor 20 mg, Zetia 10 mg daily.  Hypertension: Controlled  I encouraged the patient to discuss with the PCP re: anxiety medications  F/u in 6 months   Elder Negus, MD Pager: [phone] Office: [phone]

## 2022-12-12 NOTE — Patient Instructions (Signed)
Continue current medications Restart physical therapy Use meclizine when symptomatic Continue to follow with PCP return as needed

## 2022-12-15 ENCOUNTER — Ambulatory Visit: Payer: Medicare Other | Admitting: Cardiology

## 2023-01-26 ENCOUNTER — Ambulatory Visit: Payer: Medicare Other | Admitting: Cardiology

## 2023-02-13 ENCOUNTER — Other Ambulatory Visit: Payer: Self-pay | Admitting: Cardiology

## 2023-02-13 DIAGNOSIS — I25118 Atherosclerotic heart disease of native coronary artery with other forms of angina pectoris: Secondary | ICD-10-CM

## 2023-02-17 ENCOUNTER — Other Ambulatory Visit: Payer: Self-pay | Admitting: Cardiology

## 2023-02-21 ENCOUNTER — Other Ambulatory Visit: Payer: Self-pay

## 2023-02-21 MED ORDER — LOSARTAN POTASSIUM 25 MG PO TABS: 25.0000 mg | ORAL_TABLET | Freq: Two times a day (BID) | ORAL | 1 refills | Status: DC

## 2023-03-04 ENCOUNTER — Other Ambulatory Visit: Payer: Self-pay | Admitting: Cardiology

## 2023-03-04 DIAGNOSIS — I519 Heart disease, unspecified: Secondary | ICD-10-CM

## 2023-03-04 DIAGNOSIS — I5032 Chronic diastolic (congestive) heart failure: Secondary | ICD-10-CM

## 2023-03-05 ENCOUNTER — Other Ambulatory Visit: Payer: Self-pay

## 2023-03-05 DIAGNOSIS — R0789 Other chest pain: Secondary | ICD-10-CM

## 2023-03-05 MED ORDER — AMLODIPINE BESYLATE 5 MG PO TABS: 5.0000 mg | ORAL_TABLET | Freq: Every day | ORAL | 3 refills | Status: DC

## 2023-03-05 MED ORDER — METOPROLOL SUCCINATE ER 50 MG PO TB24: 50.0000 mg | ORAL_TABLET | Freq: Every day | ORAL | 3 refills | Status: DC

## 2023-03-05 MED ORDER — ESOMEPRAZOLE MAGNESIUM 20 MG PO CPDR
20.0000 mg | DELAYED_RELEASE_CAPSULE | Freq: Every day | ORAL | 3 refills | Status: DC
Start: 2023-03-05 — End: 2024-04-04

## 2023-03-15 ENCOUNTER — Other Ambulatory Visit: Payer: Self-pay

## 2023-03-15 ENCOUNTER — Emergency Department (HOSPITAL_COMMUNITY)
Admission: EM | Admit: 2023-03-15 | Discharge: 2023-03-15 | Disposition: A | Discharge status: Home / Self Care | Payer: Medicare Other | Attending: Emergency Medicine | Admitting: Emergency Medicine

## 2023-03-15 ENCOUNTER — Emergency Department (HOSPITAL_COMMUNITY): Payer: Medicare Other

## 2023-03-15 DIAGNOSIS — Z7982 Long term (current) use of aspirin: Secondary | ICD-10-CM | POA: Diagnosis not present

## 2023-03-15 DIAGNOSIS — Z794 Long term (current) use of insulin: Secondary | ICD-10-CM | POA: Diagnosis not present

## 2023-03-15 DIAGNOSIS — I110 Hypertensive heart disease with heart failure: Secondary | ICD-10-CM | POA: Insufficient documentation

## 2023-03-15 DIAGNOSIS — R42 Dizziness and giddiness: Secondary | ICD-10-CM | POA: Diagnosis present

## 2023-03-15 DIAGNOSIS — R0789 Other chest pain: Secondary | ICD-10-CM

## 2023-03-15 DIAGNOSIS — E119 Type 2 diabetes mellitus without complications: Secondary | ICD-10-CM | POA: Diagnosis not present

## 2023-03-15 DIAGNOSIS — N179 Acute kidney failure, unspecified: Secondary | ICD-10-CM

## 2023-03-15 DIAGNOSIS — I509 Heart failure, unspecified: Secondary | ICD-10-CM | POA: Diagnosis not present

## 2023-03-15 DIAGNOSIS — D649 Anemia, unspecified: Secondary | ICD-10-CM | POA: Insufficient documentation

## 2023-03-15 DIAGNOSIS — Z79899 Other long term (current) drug therapy: Secondary | ICD-10-CM | POA: Insufficient documentation

## 2023-03-15 DIAGNOSIS — I2510 Atherosclerotic heart disease of native coronary artery without angina pectoris: Secondary | ICD-10-CM | POA: Diagnosis not present

## 2023-03-15 LAB — BASIC METABOLIC PANEL
Anion gap: 12 (ref 5–15)
BUN: 27 mg/dL — ABNORMAL HIGH (ref 8–23)
CO2: 28 mmol/L (ref 22–32)
Calcium: 9.2 mg/dL (ref 8.9–10.3)
Chloride: 98 mmol/L (ref 98–111)
Creatinine, Ser: 1.91 mg/dL — ABNORMAL HIGH (ref 0.44–1.00)
GFR, Estimated: 25 mL/min — ABNORMAL LOW (ref >60)
Glucose, Bld: 98 mg/dL (ref 70–99)
Potassium: 4.1 mmol/L (ref 3.5–5.1)
Sodium: 138 mmol/L (ref 135–145)

## 2023-03-15 LAB — CBC
HCT: 33.5 % — ABNORMAL LOW (ref 36.0–46.0)
Hemoglobin: 10.2 g/dL — ABNORMAL LOW (ref 12.0–15.0)
MCH: 26.2 pg (ref 26.0–34.0)
MCHC: 30.4 g/dL (ref 30.0–36.0)
MCV: 86.1 fL (ref 80.0–100.0)
Platelets: 232 10*3/uL (ref 150–400)
RBC: 3.89 MIL/uL (ref 3.87–5.11)
RDW: 14.3 % (ref 11.5–15.5)
WBC: 5.6 10*3/uL (ref 4.0–10.5)
nRBC: 0 % (ref 0.0–0.2)

## 2023-03-15 LAB — TROPONIN I (HIGH SENSITIVITY)
Troponin I (High Sensitivity): 13 ng/L (ref <18)
Troponin I (High Sensitivity): 12 ng/L (ref <18)

## 2023-03-15 MED ORDER — SODIUM CHLORIDE 0.9 % IV BOLUS
500.0000 mL | Freq: Once | INTRAVENOUS | Status: AC
  Administered 2023-03-15: 500 mL via INTRAVENOUS

## 2023-03-15 NOTE — ED Notes (Signed)
Patient transported to CT 

## 2023-03-15 NOTE — ED Provider Notes (Signed)
[place] EMERGENCY DEPARTMENT AT [facility] Provider Note   CSN: [id number] Arrival date & time: 03/15/23  1039     History Chief Complaint  Patient presents with   Dizziness   left arm pain    Jeanne [name] is a 87 y.o. female with history of vertigo, diabetes, hypertension, CHF, CAD presents the emergency room today for evaluation of dizziness/lightheadedness with some left arm and left upper chest pain.  She presents to the emerged from today with her daughter.  Daughter reports that she is also recently been diagnosed with mild cognitive dementia.  Patient reports that the symptoms started yesterday.  Denies any room spinning sensation.  Lightheadedness is worse with bending forward and with head movements.  She describes it as a "woozy sensation".  She reports that this feeling is consistent with her vertigo that she has quite often.  Additionally, she reports that with the lightheadedness she had some left arm/shoulder pain.  Additionally, she mentions she has had some chest pain off and on for the past week has been relieved with nitro.  No significant shortness of breath, fevers, chills, abdominal pain, nausea, vomiting.  She walks with a cane at baseline.  No additional trouble walking or talking.  No vision changes or any headaches.  No syncopal episodes.  She has not tried her at home meclizine for the symptoms.  Daughter reports that she has been under some emotional stress recently due to a death in the family right before her symptoms started.   Dizziness Associated symptoms: chest pain   Associated symptoms: no diarrhea, no headaches, no nausea, no shortness of breath and no vomiting        Home Medications Prior to Admission medications   Medication Sig Start Date End Date Taking? Authorizing Provider  allopurinol (ZYLOPRIM) 100 MG tablet Take 100 mg by mouth daily. 05/27/20   [provider]  amLODipine (NORVASC) 5 MG tablet Take 1 tablet (5 mg  total) by mouth daily. 03/05/23   Patwardhan, Anabel Bene, MD  aspirin EC (ASPIRIN LOW DOSE) 81 MG tablet Take 1 tablet (81 mg total) by mouth daily. 02/13/23   Patwardhan, Anabel Bene, MD  atorvastatin (LIPITOR) 20 MG tablet Take 1 tablet (20 mg total) by mouth daily. 10/27/22 10/22/23  Patwardhan, Anabel Bene, MD  escitalopram (LEXAPRO) 10 MG tablet Take 2 tablets (20 mg total) by mouth daily. 12/11/22 09/07/23  Windell Norfolk, MD  esomeprazole (NEXIUM) 20 MG capsule Take 1 capsule (20 mg total) by mouth daily at 12 noon. 03/05/23   Patwardhan, Anabel Bene, MD  ezetimibe (ZETIA) 10 MG tablet Take 10 mg by mouth daily.    [provider]  ferrous sulfate 325 (65 FE) MG tablet Take 325 mg by mouth daily with breakfast.  07/30/19   [provider]  furosemide (LASIX) 20 MG tablet TAKE 1 TABLET BY MOUTH EVERY DAY 03/05/23   Patwardhan, Manish J, MD  hydrALAZINE (APRESOLINE) 25 MG tablet TAKE 1 TABLET BY MOUTH THREE TIMES A DAY Patient taking differently: Take 25 mg by mouth 3 (three) times daily. 03/27/22   Patwardhan, Manish J, MD  insulin glargine (LANTUS) 100 UNIT/ML injection Inject 65 Units into the skin daily before breakfast.    [provider]  isosorbide mononitrate (IMDUR) 60 MG 24 hr tablet Take 1 tablet (60 mg total) by mouth daily. Patient taking differently: Take 30 mg by mouth daily. 11/24/22 12/24/22  Pricilla Loveless, MD  latanoprost (XALATAN) 0.005 % ophthalmic solution  INSTILL 1 DROP IN LEFT EYE NIGHTLY Patient taking differently: Place 1 drop into the left eye at bedtime. 12/05/21   Rankin, Alford Highland, MD  losartan (COZAAR) 25 MG tablet TAKE 2 TABLETS BY MOUTH EVERY DAY 02/21/23   Patwardhan, Anabel Bene, MD  losartan (COZAAR) 25 MG tablet Take 1 tablet (25 mg total) by mouth 2 (two) times daily. 02/21/23   Patwardhan, Anabel Bene, MD  meclizine (ANTIVERT) 12.5 MG tablet Take 12.5 mg by mouth 3 (three) times daily as needed for dizziness.    [provider]  metoprolol succinate  (TOPROL-XL) 50 MG 24 hr tablet Take 1 tablet (50 mg total) by mouth daily. Take with or immediately following a meal. 03/05/23   Patwardhan, Manish J, MD  nitroGLYCERIN (NITROSTAT) 0.4 MG SL tablet PLACE 1 TABLET UNDER THE TONGUE AS NEEDED FOR CHEST PAIN. Patient taking differently: Place 0.4 mg under the tongue every 5 (five) minutes as needed for chest pain. 08/21/22   Patwardhan, Anabel Bene, MD  oxybutynin (DITROPAN-XL) 10 MG 24 hr tablet Take 10 mg by mouth daily.    [provider]  Polyvinyl Alcohol-Povidone (REFRESH OP) Place 1 drop into both eyes at bedtime.    [provider]      Allergies    Amoxicillin, Enalapril maleate, Invokana [canagliflozin], Liraglutide, Pioglitazone, and Tramadol hcl    Review of Systems   Review of Systems  Constitutional:  Negative for chills and fever.  HENT:  Negative for congestion and rhinorrhea.   Respiratory:  Negative for cough and shortness of breath.   Cardiovascular:  Positive for chest pain.  Gastrointestinal:  Negative for abdominal pain, constipation, diarrhea, nausea and vomiting.  Genitourinary:  Negative for dysuria and hematuria.  Musculoskeletal:  Positive for arthralgias. Negative for neck pain and neck stiffness.  Neurological:  Positive for dizziness and light-headedness. Negative for syncope, speech difficulty and headaches.    Physical Exam Updated Vital Signs BP (!) 168/80   Pulse 74   Temp 98.9 F (37.2 C) (Oral)   Resp 16   Ht 5\' 2"  (1.575 m)   Wt (!) 139.3 kg   SpO2 99%   BMI 56.15 kg/m  Physical Exam Vitals and nursing note reviewed.  Constitutional:      General: She is not in acute distress.    Appearance: She is not ill-appearing or toxic-appearing.  HENT:     Right Ear: Tympanic membrane, ear canal and external ear normal.     Left Ear: Tympanic membrane, ear canal and external ear normal.     Nose: Nose normal.     Mouth/Throat:     Mouth: Mucous membranes are moist.  Eyes:     General:  No scleral icterus.    Extraocular Movements: Extraocular movements intact.     Pupils: Pupils are equal, round, and reactive to light.  Cardiovascular:     Rate and Rhythm: Normal rate.  Pulmonary:     Effort: Pulmonary effort is normal. No respiratory distress.     Breath sounds: Normal breath sounds.  Chest:     Comments: Chest wall tender to palpation in the left upper area and going into the shoulder.  No crepitus, rash, overlying erythema or warmth.  No induration or fluctuance palpated. Abdominal:     Tenderness: There is no abdominal tenderness. There is no guarding or rebound.  Musculoskeletal:     Cervical back: Normal range of motion and neck supple. No rigidity.     Comments: Trace edema to  bilateral lower extremities.  Compartments are soft.  Skin:    General: Skin is warm and dry.  Neurological:     Mental Status: She is alert.     GCS: GCS eye subscore is 4. GCS verbal subscore is 5. GCS motor subscore is 6.     Cranial Nerves: No cranial nerve deficit, dysarthria or facial asymmetry.     Sensory: No sensory deficit.     Motor: No weakness or pronator drift.     Coordination: Finger-Nose-Finger Test normal.     Comments: GCS 15.  No facial asymmetry noted.  Cranial nerves II to XII intact.  She is answering questions appropriately with appropriate speech.  Patient reportedly intact and symmetric per patient.  Strength is intact in upper and lower bilateral extremities.  No pronator drift.  Normal finger-nose-finger with bilateral hands.     ED Results / Procedures / Treatments   Labs (all labs ordered are listed, but only abnormal results are displayed) Labs Reviewed  BASIC METABOLIC PANEL - Abnormal; Notable for the following components:      Result Value   BUN 27 (*)    Creatinine, Ser 1.91 (*)    GFR, Estimated 25 (*)    All other components within normal limits  CBC - Abnormal; Notable for the following components:   Hemoglobin 10.2 (*)    HCT 33.5 (*)     All other components within normal limits  URINALYSIS, ROUTINE W REFLEX MICROSCOPIC  CBG MONITORING, ED  TROPONIN I (HIGH SENSITIVITY)  TROPONIN I (HIGH SENSITIVITY)    EKG EKG Interpretation Date/Time:  Thursday March 15 2023 10:49:35 EDT Ventricular Rate:  58 PR Interval:    QRS Duration:  92 QT Interval:  478 QTC Calculation: 469 R Axis:   -72  Text Interpretation: Normal sinus rhythm Left axis deviation Cannot rule out Anterior infarct , age undetermined Abnormal ECG When compared with ECG of 06-Dec-2022 16:38, No significant change since last tracing Confirmed by Meridee Score [phone]) on 03/15/2023 11:45:08 AM  Radiology CT Head Wo Contrast  Result Date: 03/15/2023 CLINICAL DATA:  Vertigo, peripheral. EXAM: CT HEAD WITHOUT CONTRAST TECHNIQUE: Contiguous axial images were obtained from the base of the skull through the vertex without intravenous contrast. RADIATION DOSE REDUCTION: This exam was performed according to the departmental dose-optimization program which includes automated exposure control, adjustment of the mA and/or kV according to patient size and/or use of iterative reconstruction technique. COMPARISON:  Head CT 05/11/2022.  MRI brain 08/05/2022. FINDINGS: Brain: No acute hemorrhage. Unchanged moderate chronic small-vessel disease. Cortical gray-white differentiation is otherwise preserved. Prominence of the ventricles and sulci within expected range for age. No hydrocephalus or extra-axial collection. No mass effect or midline shift. Vascular: No hyperdense vessel or unexpected calcification. Skull: No calvarial fracture or suspicious bone lesion. Skull base is unremarkable. Sinuses/Orbits: Unremarkable. Other: None. IMPRESSION: 1. No acute intracranial abnormality. 2. Unchanged moderate chronic small-vessel disease. Electronically Signed   By: Orvan Falconer M.D.   On: 03/15/2023 15:18   DG Chest 2 View  Result Date: 03/15/2023 CLINICAL DATA:  Chest pain and dizziness  EXAM: CHEST - 2 VIEW COMPARISON:  Chest radiograph dated 12/06/2022 FINDINGS: Low lung volumes. Right hilar and bibasilar patchy opacities. No pleural effusion or pneumothorax. Enlarged cardiomediastinal silhouette. No acute osseous abnormality. IMPRESSION: 1. Right hilar and bibasilar patchy opacities, which may represent pulmonary edema, atelectasis, or infection. 2. Similar cardiomegaly. Electronically Signed   By: Agustin Cree M.D.   On: 03/15/2023 13:09  Procedures Procedures   Medications Ordered in ED Medications  sodium chloride 0.9 % bolus 500 mL (0 mLs Intravenous Stopped 03/15/23 1606)    ED Course/ Medical Decision Making/ A&P Clinical Course as of 03/15/23 1655  Thu Mar 15, 2023  3238 87 year old female with longstanding dizziness/vertigo here with same plus new pain in her neck into her right shoulder and chest that is been going on for few days.  No known trauma.  No fever.  Getting screening labs EKG chest x-ray head CT.  Disposition per results of testing. [MB]    Clinical Course User Index [MB] Terrilee Files, MD                           Medical Decision Making Amount and/or Complexity of Data Reviewed Labs: ordered. Radiology: ordered.   87 y.o. female presents to the ER for evaluation of dizziness and chest pain. Differential diagnosis includes but is not limited to BPPV, vestibular migraine, head trauma, AVM, intracranial tumor, multiple sclerosis, drug-related, CVA, vasovagal syncope, orthostatic hypotension, sepsis, hypoglycemia, electrolyte disturbance, anemia, anxiety/panic attack, ACS, pericarditis, myocarditis, aortic dissection, PE, pneumothorax, esophageal spasm or rupture, chronic angina, pneumonia, bronchitis, GERD, reflux/PUD, biliary disease, pancreatitis, costochondritis, anxiety. Vital signs show mildly increased blood pressure otherwise unremarkable. Physical exam as noted above.   Will order head CT for patient's symptoms.  I do not think she needs a  CT angio or MRI at this time.  She has no focal deficit.  Attending agrees with the plan.  I independently reviewed and interpreted the patient's labs.  Troponin at 13 with repeat at 12.  CBC does show slight anemia 10.2 but no leukocytosis.  Anemia appears to be around chronic baseline.  BMP does showed increasing creatinine at 1.91 from 1.38.  No other electrolyte abnormality.  Patient was unable to give a urine sample while here however she did urinate.  CT imaging shows 1. No acute intracranial abnormality. 2. Unchanged moderate chronic small-vessel disease.   Chest x-ray shows  1. Right hilar and bibasilar patchy opacities, which may represent pulmonary edema, atelectasis, or infection. 2. Similar cardiomegaly.  Given patient's slightly increasing creatinine, she was given a small bolus of fluids.  Some dehydration could play into why she is having some worsening vertigo.  She was able to ambulate to the bathroom with nurse present with assistance with a cane which is at her baseline.  She reports that she feels "a little dizzy" but that is her baseline with her consistent vertigo.  Given that her chest pain is reproducible upon palpation.  This is likely musculoskeletal in nature.  She has palpable radial pulses bilaterally that are symmetric.  Compartments are soft in the upper extremities as well as sensations intact.  She denies any traumas any falls do not think any other x-rays are needed.  I have a lower suspicion for any ACS.  The patient has a well-known history of vertigo, she reports that this feels like her vertigo.  She has a unremarkable neurologic exam.  Is feeling better after the fluids.  She is eating and drinking at bedside with good coordination.  Could be some stress or dehydration causing her symptoms.  Recommended trying the meclizine at home and staying well-hydrated.  After they consult her cardiologist about how much fluids that she should be consuming a day.  I do not think  that she is having a stroke given that she has no focal deficit.  Will ask that she follow-up with her primary care doctor for further reevaluation.  Patient already has a prescription of meclizine.  We discussed the results of the labs/imaging. The plan is follow-up with PCP as needed, stay well-hydrated, take meclizine as prescribed.. We discussed strict return precautions and red flag symptoms. The patient verbalized their understanding and agrees to the plan. The patient is stable and being discharged home in good condition.  Portions of this report may have been transcribed using voice recognition software. Every effort was made to ensure accuracy; however, inadvertent computerized transcription errors may be present.   I discussed this case with my attending physician who cosigned this note including patient's presenting symptoms, physical exam, and planned diagnostics and interventions. Attending physician stated agreement with plan or made changes to plan which were implemented.   Final Clinical Impression(s) / ED Diagnoses Final diagnoses:  Vertigo  AKI (acute kidney injury) (HCC)  Chest wall pain    Rx / DC Orders ED Discharge Orders     None         Achille Rich, Cordelia Poche 03/16/23 2025    Terrilee Files, MD 03/16/23 2131

## 2023-03-15 NOTE — Discharge Instructions (Addendum)
You present to the ER today for evaluation of your chest pain, neck pain, and dizziness.  Your labs show that your kidney function was mildly elevated.  We have given you some fluids.  I would consult your cardiologist to see how much fluid uses be drinking in the day.  I recommend continuing using her meclizine as needed.  Your CT scans otherwise were unchanged.  For your chest pain, I likely this is musculoskeletal given its reproducible upon palpation.  You can try topical pain relief or lidocaine patches as well as Tylenol.  Please make sure you follow-up your primary care doctor for reevaluation of this.  If you have any concerns, new or worsening symptoms, please return to your nearest emergency department for evaluation.  Contact a doctor if: Your medicine does not help your vertigo. Your problems get worse or you have new symptoms. You have a fever. You feel like you may vomit (nauseous), or this feeling gets worse. You start to vomit. Your family or friends see changes in how you act. You lose feeling (have numbness) in part of your body. You feel prickling and tingling in a part of your body. Get help right away if: You are always dizzy. You faint. You get very bad headaches. You get a stiff neck. Bright light starts to bother you. You have trouble moving or talking. You feel weak in your hands, arms, or legs. You have changes in your hearing or in how you see (vision). These symptoms may be an emergency. Get help right away. Call your local emergency services (911 in the [place] Do not wait to see if the symptoms will go away. Do not drive yourself to the hospital.

## 2023-03-15 NOTE — ED Notes (Signed)
Daughter stated, she has some cognitive detrioation

## 2023-03-15 NOTE — ED Notes (Signed)
Pt ambulatory to RR w assistance of this staff member. Expressed "a little" dizziness, but steady gait utilizing cane

## 2023-03-15 NOTE — ED Triage Notes (Signed)
Pt. Stated, Jeanne [name] had vertigo for about a year and Im having some pain my left arm, off and on for a week.

## 2023-03-22 ENCOUNTER — Other Ambulatory Visit: Payer: Self-pay | Admitting: Cardiology

## 2023-03-22 DIAGNOSIS — I119 Hypertensive heart disease without heart failure: Secondary | ICD-10-CM

## 2023-04-18 ENCOUNTER — Encounter: Payer: Self-pay | Admitting: Neurology

## 2023-04-18 ENCOUNTER — Ambulatory Visit: Payer: Medicare Other | Admitting: Neurology

## 2023-04-18 VITALS — BP 141/80 | HR 81 | Resp 17 | Ht 63.0 in

## 2023-04-18 DIAGNOSIS — R42 Dizziness and giddiness: Secondary | ICD-10-CM | POA: Diagnosis not present

## 2023-04-18 DIAGNOSIS — F09 Unspecified mental disorder due to known physiological condition: Secondary | ICD-10-CM

## 2023-04-18 NOTE — Patient Instructions (Signed)
Increase fluid intake Increase physical activity, encouraged her to restart exercise at [facility] Continue your other medications Continue to follow with PCP Follow-up as needed.

## 2023-04-18 NOTE — Progress Notes (Signed)
[facility]  PATIENT: Jeanne [name] DOB: 04-30-1936  REQUESTING CLINICIAN: Renford Dills, MD HISTORY FROM: Patient and daughter REASON FOR VISIT: Dizziness/Cognitive impairment    HISTORICAL  CHIEF COMPLAINT:  Chief Complaint  Patient presents with   Memory Loss    RM17, DAUGHTER CHARLOTTE PRESENT  VERTIGO: LIGHTNEADNESS, NAUSEOUS MEMORY LOSS: MOCA SCORE WAS 19   INTERVAL HISTORY 04/18/2023:  Patient presents today for follow-up, last visit was in April and since then she had a recent visit to the ED on July 11 for episode of vertigo and chest pain.  At that time workup including head CT was negative for any acute abnormality.  Since then she reports she is back to her normal self, she does have occasional vertigo and lightheadedness but not to the point of fall or difficulty walking or vomiting.  She is compliant with all of her medications, but daughter reported patient is not drinking enough fluid. She does report walking in her house using her rolling walker In terms of the memory they feel like he is stable.   INTERVAL HISTORY 12/11/2022:  Patient presents today for follow-up, she is accompanied by her daughter.  Last visit was in November.  At that time we obtained a brain MRI which showed no acute abnormality.  She did started physical therapy but self discontinued.  Daughter stated that since patient husband died, she has been down. Patient recognized that sometimes, she does not want to do anything. She was recently admitted to the hospital for chest pain.  She does report that she is still experiencing dizziness on standing and with walking.  She is using assistive device.  INTERVAL HISTORY 07/19/2022 Patient presents today for follow-up, she is accompanied by her daughter.  Since last visit a month ago, she has been complaining of dizziness.  Patient reports the dizziness is constant but varies in intensity.  Dizziness is worse with head movement and  also worse upon standing and walking.  She denies any falls, denies any headaches.  She describes dizziness as woozy feeling, lightheadedness.  Whenever she had these events, she has to sit and wait for the episode to pass.  She reports that meclizine has been helping if she takes it.   INTERVAL HISTORY 06/21/22: Patient presents today for follow-up, she is accompanied by her daughter.  Last visit was in August 10.  At that time I did start her for on Aricept for mild cognitive impairment.  She could not tolerate the medication due to vivid dreams therefore it was discontinued.  She still complains of dizziness that she describes as wooziness with position change.  There is also increased anxiety reported by patient and family.  She does not want to be alone.  Other than that there has not been any worsening of her mental status or cognition.  Overall she is stable.   HISTORY OF PRESENT ILLNESS:  This is a 87 year old woman past medical history of obesity, hypertension, hyperlipidemia, diabetes, glaucoma, CAD and CKD who is presenting for dizziness and daughter also reports cognitive impairment.  In terms of her dizziness, patient reports on July 20 while taking a shower she bent down to pick up the soap and when she got up she felt extreme dizziness that she have to sit down.  Her symptoms did not improve therefore she presented to the ED.  In the ED she had a head CT which was negative for any acute stroke and patient was discharged home with dizziness and Meclizine.  She reports dizziness at that time was positional, worse with head movement.  She has been on meclizine 3 times daily since July 20, reports improvement of her symptoms but stated that she will feel woozy when moving her head.  Denies any fall, denies any hearing loss denies any worsening tinnitus.    Her daughter who is present on the visit has concerns about her memory.  She reports that patient is forgetful, she forgets about recent  conversation, but she cannot provide good recall of her childhood memory and she also noted that she is repetitive.  Patient is not driving because she got lost while driving and since then she has decided not to drive.  Patient currently she is living with both of her daughters, she is not cooking or cleaning but states that she will walk to one room and forget the reason why she was in the room in the first place.  Daughters are handling her bills but she is able to take care of herself, she is able to bathe herself, dress herself without any help.  She does walk with the cane.   She does complaints of changes in her vision but has an appointment with ophthalmologist in a couple months    OTHER MEDICAL CONDITIONS: Diabetes, Hypertension, Hypertension, Glaucoma, CKD, CAD    REVIEW OF SYSTEMS: Full 14 system review of systems performed and negative with exception of: as noted in the HPI   ALLERGIES: Allergies  Allergen Reactions   Amoxicillin Nausea Only    Has patient had a PCN reaction causing immediate rash, facial/tongue/throat swelling, SOB or lightheadedness with hypotension: "yes, i was dizzy" Has patient had a PCN reaction causing severe rash involving mucus membranes or skin necrosis: no Did a PCN reaction that required hospitalization : no, called the DR. Did PCN reaction occurring within the last 10 years: unk if all of the above answers are "NO", then may proceed with Cephalosporin use.    Enalapril Maleate     Other reaction(s): angioedema   Invokana [Canagliflozin] Nausea Only and Other (See Comments)    Dizziness    Liraglutide     Other reaction(s): Nausea and dizziness   Pioglitazone     Other reaction(s): macular edema   Tramadol Hcl     Other reaction(s): nausea, headache and dizziness    HOME MEDICATIONS: Outpatient Medications Prior to Visit  Medication Sig Dispense Refill   allopurinol (ZYLOPRIM) 100 MG tablet Take 100 mg by mouth daily.     amLODipine  (NORVASC) 5 MG tablet Take 1 tablet (5 mg total) by mouth daily. 90 tablet 3   aspirin EC (ASPIRIN LOW DOSE) 81 MG tablet Take 1 tablet (81 mg total) by mouth daily. 90 tablet 0   atorvastatin (LIPITOR) 20 MG tablet Take 1 tablet (20 mg total) by mouth daily. 90 tablet 3   escitalopram (LEXAPRO) 10 MG tablet Take 2 tablets (20 mg total) by mouth daily. 180 tablet 2   esomeprazole (NEXIUM) 20 MG capsule Take 1 capsule (20 mg total) by mouth daily at 12 noon. 90 capsule 3   ezetimibe (ZETIA) 10 MG tablet Take 10 mg by mouth daily.     ferrous sulfate 325 (65 FE) MG tablet Take 325 mg by mouth daily with breakfast.      furosemide (LASIX) 20 MG tablet TAKE 1 TABLET BY MOUTH EVERY DAY 90 tablet 1   hydrALAZINE (APRESOLINE) 25 MG tablet Take 1 tablet (25 mg total) by mouth 3 (three) times  daily. 270 tablet 3   insulin glargine (LANTUS) 100 UNIT/ML injection Inject 65 Units into the skin daily before breakfast.     latanoprost (XALATAN) 0.005 % ophthalmic solution INSTILL 1 DROP IN LEFT EYE NIGHTLY (Patient taking differently: Place 1 drop into the left eye at bedtime.) 7.5 mL 12   losartan (COZAAR) 25 MG tablet TAKE 2 TABLETS BY MOUTH EVERY DAY 180 tablet 0   losartan (COZAAR) 25 MG tablet Take 1 tablet (25 mg total) by mouth 2 (two) times daily. 180 tablet 1   meclizine (ANTIVERT) 12.5 MG tablet Take 12.5 mg by mouth 3 (three) times daily as needed for dizziness.     metoprolol succinate (TOPROL-XL) 50 MG 24 hr tablet Take 1 tablet (50 mg total) by mouth daily. Take with or immediately following a meal. 90 tablet 3   nitroGLYCERIN (NITROSTAT) 0.4 MG SL tablet PLACE 1 TABLET UNDER THE TONGUE AS NEEDED FOR CHEST PAIN. (Patient taking differently: Place 0.4 mg under the tongue every 5 (five) minutes as needed for chest pain.) 25 tablet 3   oxybutynin (DITROPAN-XL) 10 MG 24 hr tablet Take 10 mg by mouth daily.     Polyvinyl Alcohol-Povidone (REFRESH OP) Place 1 drop into both eyes at bedtime.      isosorbide mononitrate (IMDUR) 60 MG 24 hr tablet Take 1 tablet (60 mg total) by mouth daily. (Patient taking differently: Take 30 mg by mouth daily.) 30 tablet 0   No facility-administered medications prior to visit.    PAST MEDICAL HISTORY: Past Medical History:  Diagnosis Date   Anemia    Arthritis    Carotid arterial disease (HCC)    COVID-19    Diabetes mellitus    Hyperlipidemia    Hypertension    Migraine    Obesity    Reflux    Sleep apnea     PAST SURGICAL HISTORY: Past Surgical History:  Procedure Laterality Date   CARDIAC CATHETERIZATION N/A 10/12/2015   Procedure: Left Heart Cath and Coronary Angiography;  Surgeon: Lyn Records, MD;  Location: [facility] INVASIVE CV LAB;  Service: Cardiovascular;  Laterality: N/A;   CHOLECYSTECTOMY  50 yrs ago   JOINT REPLACEMENT Right 2005   knee   LEFT HEART CATH AND CORONARY ANGIOGRAPHY N/A 12/07/2022   Procedure: LEFT HEART CATH AND CORONARY ANGIOGRAPHY;  Surgeon: Elder Negus, MD;  Location: [facility] INVASIVE CV LAB;  Service: Cardiovascular;  Laterality: N/A;   WISDOM TOOTH EXTRACTION      FAMILY HISTORY: Family History  Problem Relation Age of Onset   Heart attack Father    Heart attack Mother     SOCIAL HISTORY: Social History   Socioeconomic History   Marital status: Widowed    Spouse name: Not on file   Number of children: 2   Years of education: Not on file   Highest education level: Not on file  Occupational History   Not on file  Tobacco Use   Smoking status: Never   Smokeless tobacco: Never  Vaping Use   Vaping status: Never Used  Substance and Sexual Activity   Alcohol use: Never   Drug use: No   Sexual activity: Not on file  Other Topics Concern   Not on file  Social History Narrative   Not on file   Social Determinants of Health   Financial Resource Strain: Not on file  Food Insecurity: Not on file  Transportation Needs: Not on file  Physical Activity: Not on file  Stress: Not on  file   Social Connections: Not on file  Intimate Partner Violence: Not on file    PHYSICAL EXAM  GENERAL EXAM/CONSTITUTIONAL: Vitals:  Vitals:   04/18/23 1002 04/18/23 1012  BP: (!) 170/96 (!) 141/80  Pulse: 81   Resp: 17   Height: 5\' 3"  (1.6 m)     Body mass index is 54.38 kg/m. Wt Readings from Last 3 Encounters:  03/15/23 (!) 307 lb (139.3 kg)  12/12/22 (!) 303 lb (137.4 kg)  12/11/22 298 lb 15.1 oz (135.6 kg)   Patient is in no distress; well developed, nourished and groomed; neck is supple.   MUSCULOSKELETAL: Gait, strength, tone, movements noted in Neurologic exam below  NEUROLOGIC: MENTAL STATUS:      No data to display            04/18/2023   10:12 AM 04/13/2022   11:21 AM  Montreal Cognitive Assessment   Visuospatial/ Executive (0/5) 3 3  Naming (0/3) 3 2  Attention: Read list of digits (0/2) 1 1  Attention: Read list of letters (0/1) 1 1  Attention: Serial 7 subtraction starting at 100 (0/3) 2 3  Language: Repeat phrase (0/2) 1 2  Language : Fluency (0/1) 1 1  Abstraction (0/2) 2 2  Delayed Recall (0/5) 0 0  Orientation (0/6) 5 6  Total 19 21  Adjusted Score (based on education) 19 21    CRANIAL NERVE:  2nd, 3rd, 4th, 6th - visual fields full to confrontation, extraocular muscles intact, no nystagmus 5th - facial sensation symmetric 7th - facial strength symmetric 8th - hearing intact 9th - palate elevates symmetrically, uvula midline 11th - shoulder shrug symmetric 12th - tongue protrusion midline  MOTOR:  normal bulk and tone, full strength in the BUE, BLE  SENSORY:  normal and symmetric to light touch  COORDINATION:  finger-nose-finger, fine finger movements normal  GAIT/STATION:  Able to walk with a cane    DIAGNOSTIC DATA (LABS, IMAGING, TESTING) - I reviewed patient records, labs, notes, testing and imaging myself where available.  Lab Results  Component Value Date   WBC 5.6 03/15/2023   HGB 10.2 (L) 03/15/2023   HCT 33.5  (L) 03/15/2023   MCV 86.1 03/15/2023   PLT 232 03/15/2023      Component Value Date/Time   NA 138 03/15/2023 1108   K 4.1 03/15/2023 1108   CL 98 03/15/2023 1108   CO2 28 03/15/2023 1108   GLUCOSE 98 03/15/2023 1108   BUN 27 (H) 03/15/2023 1108   CREATININE 1.91 (H) 03/15/2023 1108   CREATININE 1.63 (H) 01/27/2020 1044   CALCIUM 9.2 03/15/2023 1108   PROT 6.3 (L) 12/07/2022 0404   ALBUMIN 2.7 (L) 12/07/2022 0404   AST 14 (L) 12/07/2022 0404   AST 12 (L) 01/27/2020 1044   ALT 6 12/07/2022 0404   ALT 9 01/27/2020 1044   ALKPHOS 72 12/07/2022 0404   BILITOT 0.6 12/07/2022 0404   BILITOT 0.3 01/27/2020 1044   GFRNONAA 25 (L) 03/15/2023 1108   GFRNONAA 29 (L) 01/27/2020 1044   GFRAA 34 (L) 01/27/2020 1044   Lab Results  Component Value Date   CHOL 116 12/07/2022   HDL 39 (L) 12/07/2022   LDLCALC 62 12/07/2022   TRIG 73 12/07/2022   CHOLHDL 3.0 12/07/2022   Lab Results  Component Value Date   HGBA1C 6.3 (H) 12/07/2022   Lab Results  Component Value Date   VITAMINB12 590 04/13/2022   Lab Results  Component Value Date  TSH 2.400 12/07/2022    Head CT 03/23/22 No acute intracranial findings are seen in noncontrast CT brain. Atrophy. Small-vessel disease   MRI Brain 08/06/2022 Multiple single and confluent T2/FLAIR hyperintense foci in the cerebral hemispheres in a pattern most consistent with moderate chronic microvascular ischemic changes.  A focus is also noted in the pons.  There has been some progression compared to the 2017 MRI. Mild generalized cortical atrophy, progressed since the 2017 MRI and stable compared to the 2023 CT scan. Single chronic microhemorrhage in the right hemisphere is stable compared to the 2017 MRI. Mild chronic sphenoid sinusitis The internal auditory canals appeared normal on this noncontrast study.   The pituitary gland has a reduced height within an enlarged sella turcica, unchanged compared to the 2017 MRI.  A partially empty sella is  nonspecific and could be incidental or due to elevated intracranial pressure.   No acute finding   Head CT 03/15/2023 1. No acute intracranial abnormality. 2. Unchanged moderate chronic small-vessel disease.   ASSESSMENT AND PLAN  87 y.o. year old female with history of obesity, hypertension, hyperlipidemia, diabetes, glaucoma, CAD and CKD and MCI who is presenting for follow up for her dizziness.  She is has a prescription for meclizine, I did advise her to use the meclizine as needed.  I also added encouraged her to increase her fluid intake and to start physical therapy.  She completed physical therapy in the past and now has plan to go to the [facility].  Again explained to patient that her dizziness is chronic, that will try to manage her symptoms with medication but she also need to increase her fluid intake and increase her physical activity because deconditioning can contribute to her symptoms. In terms of the cognitive impairment, her symptoms are stable, we will not start her on any medication.  Continue to follow with PCP return as needed.    1. Dizziness   2. Mild cognitive disorder      Patient Instructions  Increase fluid intake Increase physical activity, encouraged her to restart exercise at [facility] Continue your other medications Continue to follow with PCP Follow-up as needed.  No orders of the defined types were placed in this encounter.   No orders of the defined types were placed in this encounter.   Return if symptoms worsen or fail to improve.  I have spent a total of 30 minutes dedicated to this patient today, preparing to see patient, performing a medically appropriate examination and evaluation, ordering tests and/or medications and procedures, and counseling and educating the patient/family/caregiver; independently interpreting result and communicating results to the family/patient/caregiver; and documenting clinical information in the electronic medical  record.   Windell Norfolk, MD 04/18/2023, 1:02 PM  [facility] [address] [place], [place] [address] [phone]

## 2023-05-03 ENCOUNTER — Encounter (INDEPENDENT_AMBULATORY_CARE_PROVIDER_SITE_OTHER): Payer: Medicare Other | Admitting: Ophthalmology

## 2023-05-28 ENCOUNTER — Encounter: Payer: Self-pay | Admitting: Cardiology

## 2023-06-14 ENCOUNTER — Ambulatory Visit: Payer: Self-pay | Admitting: Cardiology

## 2023-06-16 ENCOUNTER — Other Ambulatory Visit: Payer: Self-pay | Admitting: Neurology

## 2023-07-31 ENCOUNTER — Ambulatory Visit: Payer: Medicare Other | Attending: Cardiology | Admitting: Cardiology

## 2023-07-31 ENCOUNTER — Encounter: Payer: Self-pay | Admitting: Cardiology

## 2023-07-31 VITALS — BP 138/70 | HR 71 | Resp 16 | Ht 63.0 in | Wt 308.0 lb

## 2023-07-31 DIAGNOSIS — E782 Mixed hyperlipidemia: Secondary | ICD-10-CM | POA: Diagnosis not present

## 2023-07-31 DIAGNOSIS — I10 Essential (primary) hypertension: Secondary | ICD-10-CM | POA: Diagnosis not present

## 2023-07-31 DIAGNOSIS — I2510 Atherosclerotic heart disease of native coronary artery without angina pectoris: Secondary | ICD-10-CM | POA: Diagnosis not present

## 2023-07-31 NOTE — Patient Instructions (Signed)
Medication Instructions:   Your physician recommends that you continue on your current medications as directed. Please refer to the Current Medication list given to you today.  *If you need a refill on your cardiac medications before your next appointment, please call your pharmacy*    Follow-Up: At [facility], you and your health needs are our priority.  As part of our continuing mission to provide you with exceptional heart care, we have created designated Provider Care Teams.  These Care Teams include your primary Cardiologist (physician) and Advanced Practice Providers (APPs -  Physician Assistants and Nurse Practitioners) who all work together to provide you with the care you need, when you need it.  We recommend signing up for the patient portal called "MyChart".  Sign up information is provided on this After Visit Summary.  MyChart is used to connect with patients for Virtual Visits (Telemedicine).  Patients are able to view lab/test results, encounter notes, upcoming appointments, etc.  Non-urgent messages can be sent to your provider as well.   To learn more about what you can do with MyChart, go to [email or web address].    Your next appointment:   1 year(s)  Provider:   Dr. Rosemary Holms

## 2023-07-31 NOTE — Progress Notes (Signed)
  Cardiology Office Note:  .   Date:  07/31/2023  ID:  Samul Dada, DOB Sep 13, 1935, MRN [id number] PCP: Renford Dills, MD  [facility] Providers Cardiologist:  Truett Mainland, MD PCP: Renford Dills, MD  Chief Complaint  Patient presents with   Coronary artery disease of native artery of native heart wi   Follow-up    6 month      History of Present Illness: .    Jeanne [name] is a 87 y.o. female with hypertension, hyperlipidemia, type 2 DM, nonobstructive CAD, OSA, IgG kappa monoclonal gammopathy, CKD IV  Patient is here with her daughter today.  Patient has good days and bad days, more to do with musculoskeletal pains in her back and her neck.  She denies any chest pain, course unusual exertional dyspnea, leg edema.  Blood pressure is well-controlled.  Her atorvastatin was changed to pravastatin by her PCP.  Reviewed recently done results with the patient, details below.  Vitals:   07/31/23 0956  BP: 138/70  Pulse: 71  Resp: 16  SpO2: 99%     ROS:  Review of Systems  Cardiovascular:  Negative for chest pain, dyspnea on exertion, leg swelling, palpitations and syncope.  Musculoskeletal:  Positive for arthritis and back pain.     Studies Reviewed: Marland Kitchen        Independently interpreted Labs 04/04/2023; Chol 104, TG 112, HDL 36, LDL 47 HbA1C 7.0%    Physical Exam:   Physical Exam Vitals and nursing note reviewed.  Constitutional:      General: She is not in acute distress.    Appearance: She is obese.  Neck:     Vascular: No JVD.  Cardiovascular:     Rate and Rhythm: Normal rate and regular rhythm.     Heart sounds: Normal heart sounds. No murmur heard. Pulmonary:     Effort: Pulmonary effort is normal.     Breath sounds: Normal breath sounds. No wheezing or rales.  Musculoskeletal:     Right lower leg: No edema.     Left lower leg: No edema.      VISIT DIAGNOSES:   ICD-10-CM   1. Coronary artery disease involving native coronary  artery of native heart without angina pectoris  I25.10     2. Mixed hyperlipidemia  E78.2     3. Essential hypertension  I10        ASSESSMENT AND PLAN: .    Jeanne [name] is a 87 y.o. female with hypertension, hyperlipidemia, type 2 DM, nonobstructive CAD, OSA, IgG kappa monoclonal gammopathy, CKD IV   CAD: Mild to moderate, nonobstructive (cath 12/2022). Continue aspirin, statin, amlodipine 5 mg daily, Imdur 60 mg daily, metoprolol succinate 50 mg daily. Lipids well-controlled, on pravastatin and Zetia.   Hypertension: Controlled        F/u in 1 year  Signed, Elder Negus, MD

## 2023-08-06 ENCOUNTER — Telehealth: Payer: Self-pay | Admitting: Neurology

## 2023-08-06 NOTE — Telephone Encounter (Signed)
Called and relayed note to patient who states they have follow up with PCP tomorrow and will talk about the UTI symptoms and let us know if we are needed.

## 2023-08-06 NOTE — Telephone Encounter (Signed)
Pt's daughter is asking for a call to discuss concerning symptoms she has about pt being dizzy, having vertigo, vivid dreams, super sensitive to sound and noises and pain in feet.  Phone rep explained to daughter that Dr Teresa Coombs can only address what was in referral sent over.  Phone rep asked if she has mentioned any of these symptoms to PCP , she states he attributes these symptoms to pt getting older and that vertigo will go away. Pt's daughter would like a call from RN re: Dr Teresa Coombs does see pt for.

## 2023-08-06 NOTE — Telephone Encounter (Signed)
Called and spoke to [place] per DPR,  She reports her mother is complaining about dizziness when she bends down and stands up, she feels like she her head is spinning, she reports no changes in meds that she is aware of but her sister manages her medications. She also reports that she now has been hearing things and having hallucinations in the middle of night. This morning the patient asked if daughter came in her room last night because she saw her there 3 times when she woke up. Daughter also wants to note pain in knees and feet, doesn't know if it is nerve or arthritis related. She just saw the cardiologist who told her it was likely joint pain. Daughter would like recommendations from the provider on next steps.

## 2023-08-06 NOTE — Telephone Encounter (Signed)
They need to follow up with PCP or urgent care to rule out infection (mainly urinary track infection).

## 2023-08-09 ENCOUNTER — Other Ambulatory Visit: Payer: Self-pay | Admitting: Cardiology

## 2023-08-09 DIAGNOSIS — I2510 Atherosclerotic heart disease of native coronary artery without angina pectoris: Secondary | ICD-10-CM

## 2023-08-14 ENCOUNTER — Other Ambulatory Visit: Payer: Self-pay | Admitting: *Deleted

## 2023-08-14 MED ORDER — PRAVASTATIN SODIUM 40 MG PO TABS: 40.0000 mg | ORAL_TABLET | Freq: Every day | ORAL | 2 refills | Status: DC

## 2023-08-14 NOTE — Progress Notes (Signed)
Refill request for pravastatin faxed to our office by her pharmacy [facility].   Refill for pravastatin 40 mg po daily was sent to [facility] pharmacy, as requested.

## 2023-08-30 ENCOUNTER — Inpatient Hospital Stay (HOSPITAL_COMMUNITY)
Admission: EM | Admit: 2023-08-30 | Discharge: 2023-09-04 | DRG: 481 | Disposition: A | Discharge status: Skilled Nursing Facility | Payer: Medicare Other | Attending: Internal Medicine | Admitting: Internal Medicine

## 2023-08-30 ENCOUNTER — Other Ambulatory Visit: Payer: Self-pay | Admitting: Cardiology

## 2023-08-30 ENCOUNTER — Emergency Department (HOSPITAL_COMMUNITY): Payer: Medicare Other

## 2023-08-30 ENCOUNTER — Other Ambulatory Visit: Payer: Self-pay

## 2023-08-30 DIAGNOSIS — N1832 Chronic kidney disease, stage 3b: Secondary | ICD-10-CM | POA: Diagnosis present

## 2023-08-30 DIAGNOSIS — M1990 Unspecified osteoarthritis, unspecified site: Secondary | ICD-10-CM | POA: Diagnosis present

## 2023-08-30 DIAGNOSIS — Z7982 Long term (current) use of aspirin: Secondary | ICD-10-CM

## 2023-08-30 DIAGNOSIS — I5032 Chronic diastolic (congestive) heart failure: Secondary | ICD-10-CM

## 2023-08-30 DIAGNOSIS — Z888 Allergy status to other drugs, medicaments and biological substances status: Secondary | ICD-10-CM

## 2023-08-30 DIAGNOSIS — Z79899 Other long term (current) drug therapy: Secondary | ICD-10-CM

## 2023-08-30 DIAGNOSIS — Y92015 Private garage of single-family (private) house as the place of occurrence of the external cause: Secondary | ICD-10-CM

## 2023-08-30 DIAGNOSIS — S72009A Fracture of unspecified part of neck of unspecified femur, initial encounter for closed fracture: Secondary | ICD-10-CM | POA: Diagnosis present

## 2023-08-30 DIAGNOSIS — W010XXA Fall on same level from slipping, tripping and stumbling without subsequent striking against object, initial encounter: Secondary | ICD-10-CM | POA: Diagnosis present

## 2023-08-30 DIAGNOSIS — Z794 Long term (current) use of insulin: Secondary | ICD-10-CM

## 2023-08-30 DIAGNOSIS — Z8249 Family history of ischemic heart disease and other diseases of the circulatory system: Secondary | ICD-10-CM

## 2023-08-30 DIAGNOSIS — I519 Heart disease, unspecified: Secondary | ICD-10-CM

## 2023-08-30 DIAGNOSIS — M25551 Pain in right hip: Secondary | ICD-10-CM | POA: Diagnosis not present

## 2023-08-30 DIAGNOSIS — Z885 Allergy status to narcotic agent status: Secondary | ICD-10-CM

## 2023-08-30 DIAGNOSIS — G43909 Migraine, unspecified, not intractable, without status migrainosus: Secondary | ICD-10-CM | POA: Diagnosis present

## 2023-08-30 DIAGNOSIS — S72364A Nondisplaced segmental fracture of shaft of right femur, initial encounter for closed fracture: Secondary | ICD-10-CM

## 2023-08-30 DIAGNOSIS — K219 Gastro-esophageal reflux disease without esophagitis: Secondary | ICD-10-CM | POA: Diagnosis present

## 2023-08-30 DIAGNOSIS — E1122 Type 2 diabetes mellitus with diabetic chronic kidney disease: Secondary | ICD-10-CM | POA: Diagnosis present

## 2023-08-30 DIAGNOSIS — I130 Hypertensive heart and chronic kidney disease with heart failure and stage 1 through stage 4 chronic kidney disease, or unspecified chronic kidney disease: Secondary | ICD-10-CM | POA: Diagnosis present

## 2023-08-30 DIAGNOSIS — D62 Acute posthemorrhagic anemia: Secondary | ICD-10-CM | POA: Diagnosis not present

## 2023-08-30 DIAGNOSIS — G4733 Obstructive sleep apnea (adult) (pediatric): Secondary | ICD-10-CM | POA: Diagnosis present

## 2023-08-30 DIAGNOSIS — I2510 Atherosclerotic heart disease of native coronary artery without angina pectoris: Secondary | ICD-10-CM | POA: Diagnosis present

## 2023-08-30 DIAGNOSIS — S72451A Displaced supracondylar fracture without intracondylar extension of lower end of right femur, initial encounter for closed fracture: Secondary | ICD-10-CM | POA: Diagnosis not present

## 2023-08-30 DIAGNOSIS — Z880 Allergy status to penicillin: Secondary | ICD-10-CM

## 2023-08-30 DIAGNOSIS — E785 Hyperlipidemia, unspecified: Secondary | ICD-10-CM | POA: Diagnosis present

## 2023-08-30 DIAGNOSIS — M9711XA Periprosthetic fracture around internal prosthetic right knee joint, initial encounter: Secondary | ICD-10-CM | POA: Diagnosis present

## 2023-08-30 DIAGNOSIS — Z6841 Body Mass Index (BMI) 40.0 and over, adult: Secondary | ICD-10-CM

## 2023-08-30 DIAGNOSIS — S72141A Displaced intertrochanteric fracture of right femur, initial encounter for closed fracture: Principal | ICD-10-CM

## 2023-08-30 DIAGNOSIS — E1165 Type 2 diabetes mellitus with hyperglycemia: Secondary | ICD-10-CM | POA: Diagnosis present

## 2023-08-30 LAB — BASIC METABOLIC PANEL
Anion gap: 11 (ref 5–15)
BUN: 19 mg/dL (ref 8–23)
CO2: 27 mmol/L (ref 22–32)
Calcium: 9.1 mg/dL (ref 8.9–10.3)
Chloride: 97 mmol/L — ABNORMAL LOW (ref 98–111)
Creatinine, Ser: 1.49 mg/dL — ABNORMAL HIGH (ref 0.44–1.00)
GFR, Estimated: 34 mL/min — ABNORMAL LOW (ref >60)
Glucose, Bld: 130 mg/dL — ABNORMAL HIGH (ref 70–99)
Potassium: 4.3 mmol/L (ref 3.5–5.1)
Sodium: 135 mmol/L (ref 135–145)

## 2023-08-30 LAB — CBC WITH DIFFERENTIAL/PLATELET
Abs Immature Granulocytes: 0.04 10*3/uL (ref 0.00–0.07)
Basophils Absolute: 0 10*3/uL (ref 0.0–0.1)
Basophils Relative: 1 %
Eosinophils Absolute: 0.2 10*3/uL (ref 0.0–0.5)
Eosinophils Relative: 3 %
HCT: 38.1 % (ref 36.0–46.0)
Hemoglobin: 11.6 g/dL — ABNORMAL LOW (ref 12.0–15.0)
Immature Granulocytes: 1 %
Lymphocytes Relative: 22 %
Lymphs Abs: 1.4 10*3/uL (ref 0.7–4.0)
MCH: 26.4 pg (ref 26.0–34.0)
MCHC: 30.4 g/dL (ref 30.0–36.0)
MCV: 86.8 fL (ref 80.0–100.0)
Monocytes Absolute: 0.6 10*3/uL (ref 0.1–1.0)
Monocytes Relative: 10 %
Neutro Abs: 4 10*3/uL (ref 1.7–7.7)
Neutrophils Relative %: 63 %
Platelets: 208 10*3/uL (ref 150–400)
RBC: 4.39 MIL/uL (ref 3.87–5.11)
RDW: 13.7 % (ref 11.5–15.5)
WBC: 6.3 10*3/uL (ref 4.0–10.5)
nRBC: 0 % (ref 0.0–0.2)

## 2023-08-30 MED ORDER — IOHEXOL 350 MG/ML SOLN
80.0000 mL | Freq: Once | INTRAVENOUS | Status: AC | PRN
  Administered 2023-08-30: 80 mL via INTRAVENOUS

## 2023-08-30 MED ORDER — SODIUM CHLORIDE 0.9 % IV SOLN: 3.0000 g | INTRAVENOUS | Status: DC

## 2023-08-30 MED ORDER — CLINDAMYCIN PHOSPHATE 900 MG/50ML IV SOLN
900.0000 mg | INTRAVENOUS | Status: AC
  Administered 2023-08-31: 900 mg via INTRAVENOUS
  Filled 2023-08-30: qty 50

## 2023-08-30 MED ORDER — OXYCODONE HCL 5 MG PO TABS
5.0000 mg | ORAL_TABLET | Freq: Once | ORAL | Status: AC
  Administered 2023-08-30: 5 mg via ORAL
  Filled 2023-08-30: qty 1

## 2023-08-30 MED ORDER — CEFAZOLIN SODIUM-DEXTROSE 2-4 GM/100ML-% IV SOLN: 2.0000 g | INTRAVENOUS | Status: DC

## 2023-08-30 MED ORDER — ONDANSETRON HCL 4 MG/2ML IJ SOLN
4.0000 mg | Freq: Once | INTRAMUSCULAR | Status: AC
  Administered 2023-08-30: 4 mg via INTRAVENOUS
  Filled 2023-08-30: qty 2

## 2023-08-30 MED ORDER — TRANEXAMIC ACID-NACL 1000-0.7 MG/100ML-% IV SOLN
1000.0000 mg | INTRAVENOUS | Status: AC
  Administered 2023-08-30: 1000 mg via INTRAVENOUS
  Filled 2023-08-30: qty 100

## 2023-08-30 MED ORDER — OXYCODONE-ACETAMINOPHEN 5-325 MG PO TABS
1.0000 | ORAL_TABLET | Freq: Once | ORAL | Status: AC
  Administered 2023-08-30: 1 via ORAL
  Filled 2023-08-30: qty 1

## 2023-08-30 NOTE — ED Triage Notes (Signed)
Pt to the ed from home with a CC of trip and fall while getting into car. Pt relay relays sever right knee pain and back of the head pain. Pt felt right knee pop when she fell. Pt is not on blood thinners. Pt did not have LOC.

## 2023-08-30 NOTE — Consult Note (Signed)
Orthopedic Surgery Consult Note  Assessment: Patient is a 87 y.o. female with right periprosthetic femur fracture   Plan: -Will need operative fixation -Diet: NPO at midnight -Ancef and TXA on call to OR -Weight bearing status: NWB RLE -PT evaluate and treat post-operatively -Pain control -Dispo: pending completion of operative plans -Will discuss surgery with my traumatology colleagues in the morning  ___________________________________________________________________________   Reason for consult: right knee pain  History:  Patient is a 87 y.o. female who had a fall in her garage earlier tonight. She fell onto her right knee. She noted immediate onset of right knee pain. Was unable to ambulate. Was brought to [facility] ER. Is reporting right knee pain. No pain elsewhere.   Of note, she said she had knee replacement several years ago in a different state. She believes she only had one surgery on that knee.   Review of systems: General: denies fevers and chills, myalgias Neurologic: denies recent changes in vision, slurred speech Abdomen: denies nausea, vomiting, hematemesis Respiratory: denies cough, shortness of breath  Past Medical History:  Diagnosis Date   Anemia    Arthritis    Carotid arterial disease (HCC)    COVID-19    Diabetes mellitus    Hyperlipidemia    Hypertension    Migraine    Obesity    Reflux    Sleep apnea     Allergies  Allergen Reactions   Amoxicillin Nausea Only    Has patient had a PCN reaction causing immediate rash, facial/tongue/throat swelling, SOB or lightheadedness with hypotension: "yes, i was dizzy" Has patient had a PCN reaction causing severe rash involving mucus membranes or skin necrosis: no Did a PCN reaction that required hospitalization : no, called the DR. Did PCN reaction occurring within the last 10 years: unk if all of the above answers are "NO", then may proceed with Cephalosporin use.    Enalapril Maleate     Other  reaction(s): angioedema   Invokana [Canagliflozin] Nausea Only and Other (See Comments)    Dizziness    Liraglutide     Other reaction(s): Nausea and dizziness   Pioglitazone     Other reaction(s): macular edema   Tramadol Hcl     Other reaction(s): nausea, headache and dizziness     Past Surgical History:  Procedure Laterality Date   CARDIAC CATHETERIZATION N/A 10/12/2015   Procedure: Left Heart Cath and Coronary Angiography;  Surgeon: Lyn Records, MD;  Location: [facility] INVASIVE CV LAB;  Service: Cardiovascular;  Laterality: N/A;   CHOLECYSTECTOMY  50 yrs ago   JOINT REPLACEMENT Right 2005   knee   LEFT HEART CATH AND CORONARY ANGIOGRAPHY N/A 12/07/2022   Procedure: LEFT HEART CATH AND CORONARY ANGIOGRAPHY;  Surgeon: Elder Negus, MD;  Location: [facility] INVASIVE CV LAB;  Service: Cardiovascular;  Laterality: N/A;   WISDOM TOOTH EXTRACTION        Social History   Tobacco Use   Smoking status: Never   Smokeless tobacco: Never  Substance Use Topics   Alcohol use: Never    Family history: -reviewed and not pertinent to periprosthetic femur fracture   Physical Exam:  General: no acute distress, appears stated age Neurologic: alert, answering questions appropriately, following commands Cardiovascular: regular rate, no cyanosis Respiratory: unlabored breathing on room air, symmetric chest rise Psychiatric: appropriate affect, normal cadence to speech  MSK:   -Bilateral upper extremities  No tenderness to palpation over extremity, no gross deformity, no open wounds Fires deltoid, biceps, triceps, wrist  extensors, wrist flexors, finger extensors, finger flexors  AIN/PIN/IO intact  Palpable radial pulse  Sensation intact to light touch in median/ulnar/radial/axillary nerve distributions  Hand warm and well perfused  -Right lower extremity  No tenderness to palpation over extremity, except over the knee. Gross deformity at the knee. No open wounds.  EHL/TA/GSC  intact Plantarflexes and dorsiflexes toes Sensation intact to light touch in sural, saphenous, tibial, deep peroneal, and superficial peroneal nerve distributions Foot warm and well perfused  -Left lower extremity  No tenderness to palpation over extremity, no gross deformity, no pain with log roll, no open wounds Fires hip flexors, quadriceps, hamstrings, tibialis anterior, gastrocnemius and soleus, extensor hallucis longus Plantarflexes and dorsiflexes toes Sensation intact to light touch in sural, saphenous, tibial, deep peroneal, and superficial peroneal nerve distributions Foot warm and well perfused  Imaging: XR of the right knee from 08/30/2023 was independently reviewed and interpreted, showing a right periprosthetic femur fracture   Patient name: Jeanne [name] Patient MRN: [id number] Date: 08/30/23

## 2023-08-30 NOTE — Progress Notes (Signed)
Orthopedic Tech Progress Note Patient Details:  Jeanne [name] 11/29/1935 [id number]   RLE Applied to patient  Ortho Devices Type of Ortho Device: Knee Immobilizer Ortho Device/Splint Location: RLE Ortho Device/Splint Interventions: Ordered, Application, Adjustment   Post Interventions Patient Tolerated: Well Instructions Provided: Adjustment of device, Care of device  Diannia Ruder 08/30/2023, 11:30 PM

## 2023-08-30 NOTE — ED Provider Notes (Signed)
[place] EMERGENCY DEPARTMENT AT [facility] Provider Note  CSN: [id number] Arrival date & time: 08/30/23 1652  Chief Complaint(s) Fall  HPI Jeanne [name] is a 87 y.o. female here today after she had a trip and fall while getting into a car.  Patient says that she felt as though her right knee gave out, and she fell backward and landed on her back striking her head.  She is endorsing head pain.  She is not on blood thinners.  Patient reports that she has been having intermittent episodes of vertigo and chest pain for the last several years.   Past Medical History Past Medical History:  Diagnosis Date   Anemia    Arthritis    Carotid arterial disease (HCC)    COVID-19    Diabetes mellitus    Hyperlipidemia    Hypertension    Migraine    Obesity    Reflux    Sleep apnea    Patient Active Problem List   Diagnosis Date Noted   Chest pain 12/06/2022   Mixed hyperlipidemia 10/27/2022   Pseudophakia, both eyes 06/05/2022   Dizziness 05/17/2022   Excessive cerumen in both ear canals 05/17/2022   GERD (gastroesophageal reflux disease) 05/02/2021   Chronic diastolic CHF (congestive heart failure) (HCC) 10/07/2020   Moderate nonproliferative diabetic retinopathy of both eyes (HCC) 05/25/2020   Chorioretinal scar 05/25/2020   Left epiretinal membrane 05/25/2020   Posterior vitreous detachment of both eyes 05/25/2020   Acute respiratory failure with hypoxia (HCC) 10/11/2019   CAP (community acquired pneumonia) 10/11/2019   Diastolic dysfunction, left ventricle 04/05/2019   Coronary artery disease involving native coronary artery of native heart without angina pectoris 11/23/2018   Hyperlipidemia 11/23/2018   Unstable angina (HCC) 10/12/2015   Abnormal myocardial perfusion study 10/12/2015   NSVT (nonsustained ventricular tachycardia) (HCC) 10/11/2015   Essential hypertension 10/11/2015   CKD (chronic kidney disease), stage IV (HCC) 10/11/2015   Atypical chest  pain    Angina pectoris (HCC) 10/08/2015   DM2 (diabetes mellitus, type 2) (HCC) 10/08/2015   AKI (acute kidney injury) (HCC)    Abscess of left arm 01/01/2013   Migraine    Morbid obesity (HCC)    Obstructive sleep apnea    Stenosis of left carotid artery    Reflux    Anemia of chronic disease    Home Medication(s) Prior to Admission medications   Medication Sig Start Date End Date Taking? Authorizing Provider  allopurinol (ZYLOPRIM) 100 MG tablet Take 100 mg by mouth daily. 05/27/20   [provider]  amLODipine (NORVASC) 5 MG tablet Take 1 tablet (5 mg total) by mouth daily. 03/05/23   Patwardhan, Anabel Bene, MD  aspirin EC (ASPIRIN LOW DOSE) 81 MG tablet Take 1 tablet (81 mg total) by mouth daily. 02/13/23   Patwardhan, Anabel Bene, MD  Cholecalciferol 50 MCG (2000 UT) TABS Take by mouth. 07/30/19   [provider]  escitalopram (LEXAPRO) 10 MG tablet TAKE 2 TABLETS BY MOUTH EVERY DAY 06/18/23   Windell Norfolk, MD  esomeprazole (NEXIUM) 20 MG capsule Take 1 capsule (20 mg total) by mouth daily at 12 noon. 03/05/23   Patwardhan, Anabel Bene, MD  ezetimibe (ZETIA) 10 MG tablet Take 10 mg by mouth daily.    [provider]  ferrous sulfate 325 (65 FE) MG tablet Take 325 mg by mouth daily with breakfast.  07/30/19   [provider]  furosemide (LASIX) 20 MG tablet TAKE 1 TABLET BY MOUTH  EVERY DAY 08/30/23   Patwardhan, Anabel Bene, MD  hydrALAZINE (APRESOLINE) 25 MG tablet Take 1 tablet (25 mg total) by mouth 3 (three) times daily. 03/22/23   Patwardhan, Manish J, MD  insulin glargine (LANTUS) 100 UNIT/ML injection Inject 65 Units into the skin daily before breakfast.    [provider]  isosorbide mononitrate (IMDUR) 60 MG 24 hr tablet Take 1 tablet (60 mg total) by mouth daily. Patient taking differently: Take 30 mg by mouth daily. 11/24/22 07/31/23  Pricilla Loveless, MD  latanoprost (XALATAN) 0.005 % ophthalmic solution INSTILL 1 DROP IN LEFT EYE  NIGHTLY Patient taking differently: Place 1 drop into the left eye at bedtime. 12/05/21   Rankin, Alford Highland, MD  losartan (COZAAR) 25 MG tablet TAKE 2 TABLETS BY MOUTH EVERY DAY 02/21/23   Patwardhan, Anabel Bene, MD  losartan (COZAAR) 25 MG tablet Take 1 tablet (25 mg total) by mouth 2 (two) times daily. 02/21/23   Patwardhan, Anabel Bene, MD  meclizine (ANTIVERT) 12.5 MG tablet Take 12.5 mg by mouth 3 (three) times daily as needed for dizziness.    [provider]  metoprolol succinate (TOPROL-XL) 50 MG 24 hr tablet Take 1 tablet (50 mg total) by mouth daily. Take with or immediately following a meal. 03/05/23   Patwardhan, Manish J, MD  nitroGLYCERIN (NITROSTAT) 0.4 MG SL tablet PLACE 1 TABLET UNDER THE TONGUE AS NEEDED FOR CHEST PAIN. Patient taking differently: Place 0.4 mg under the tongue every 5 (five) minutes as needed for chest pain. 08/21/22   Patwardhan, Anabel Bene, MD  oxybutynin (DITROPAN-XL) 10 MG 24 hr tablet Take 10 mg by mouth daily.    [provider]  Polyvinyl Alcohol-Povidone (REFRESH OP) Place 1 drop into both eyes at bedtime.    [provider]  pravastatin (PRAVACHOL) 40 MG tablet Take 1 tablet (40 mg total) by mouth daily. 08/14/23   Elder Negus, MD                                                                                                                                    Past Surgical History Past Surgical History:  Procedure Laterality Date   CARDIAC CATHETERIZATION N/A 10/12/2015   Procedure: Left Heart Cath and Coronary Angiography;  Surgeon: Lyn Records, MD;  Location: [facility] INVASIVE CV LAB;  Service: Cardiovascular;  Laterality: N/A;   CHOLECYSTECTOMY  50 yrs ago   JOINT REPLACEMENT Right 2005   knee   LEFT HEART CATH AND CORONARY ANGIOGRAPHY N/A 12/07/2022   Procedure: LEFT HEART CATH AND CORONARY ANGIOGRAPHY;  Surgeon: Elder Negus, MD;  Location: [facility] INVASIVE CV LAB;  Service: Cardiovascular;  Laterality: N/A;   WISDOM TOOTH  EXTRACTION     Family History Family History  Problem Relation Age of Onset   Heart attack Father    Heart attack Mother     Social History Social History   Tobacco Use  Smoking status: Never   Smokeless tobacco: Never  Vaping Use   Vaping status: Never Used  Substance Use Topics   Alcohol use: Never   Drug use: No   Allergies Amoxicillin, Enalapril maleate, Invokana [canagliflozin], Liraglutide, Pioglitazone, and Tramadol hcl  Review of Systems Review of Systems  Physical Exam Vital Signs  I have reviewed the triage vital signs BP (!) 151/87 (BP Location: Right Wrist)   Pulse 74   Temp 98.1 F (36.7 C) (Oral)   Resp 18   Ht 5\' 3"  (1.6 m)   Wt (!) 139 kg   SpO2 97%   BMI 54.28 kg/m   Physical Exam Vitals reviewed.  HENT:     Head: Normocephalic and atraumatic.     Nose: Nose normal.  Eyes:     Pupils: Pupils are equal, round, and reactive to light.  Cardiovascular:     Rate and Rhythm: Normal rate.     Pulses: Normal pulses.  Pulmonary:     Effort: Pulmonary effort is normal.  Abdominal:     Palpations: Abdomen is soft.  Musculoskeletal:        General: Normal range of motion.     Cervical back: Normal range of motion and neck supple. No rigidity.  Skin:    General: Skin is warm.  Neurological:     General: No focal deficit present.     Mental Status: She is alert.     Cranial Nerves: No cranial nerve deficit.     Motor: No weakness.     ED Results and Treatments Labs (all labs ordered are listed, but only abnormal results are displayed) Labs Reviewed  BASIC METABOLIC PANEL - Abnormal; Notable for the following components:      Result Value   Chloride 97 (*)    Glucose, Bld 130 (*)    Creatinine, Ser 1.49 (*)    GFR, Estimated 34 (*)    All other components within normal limits  CBC WITH DIFFERENTIAL/PLATELET - Abnormal; Notable for the following components:   Hemoglobin 11.6 (*)    All other components within normal limits                                                                                                                           Radiology CT ANGIO LOWER EXT BILAT W &/OR WO CONTRAST Result Date: 08/30/2023 CLINICAL DATA:  Recent fall with right knee pain EXAM: CT ANGIOGRAPHY OF ABDOMINAL AORTA WITH ILIOFEMORAL RUNOFF TECHNIQUE: Multidetector CT imaging of the abdomen, pelvis and lower extremities was performed using the standard protocol during bolus administration of intravenous contrast. Multiplanar CT image reconstructions and MIPs were obtained to evaluate the vascular anatomy. RADIATION DOSE REDUCTION: This exam was performed according to the departmental dose-optimization program which includes automated exposure control, adjustment of the mA and/or kV according to patient size and/or use of iterative reconstruction technique. CONTRAST:  80mL OMNIPAQUE IOHEXOL 350 MG/ML SOLN COMPARISON:  None Available. FINDINGS: VASCULAR Aorta: Abdominal  aorta demonstrates atherosclerotic calcifications without aneurysmal dilatation or dissection. Celiac: Patent without evidence of aneurysm, dissection, vasculitis or significant stenosis. SMA: Patent without evidence of aneurysm, dissection, vasculitis or significant stenosis. Renals: Mild atherosclerotic changes are noted. Mild stenosis is noted proximally on the left. IMA: Patent without evidence of aneurysm, dissection, vasculitis or significant stenosis. RIGHT Lower Extremity Inflow: Atherosclerotic calcifications are noted in the iliac artery. No focal stenosis is noted. Runoff: Common femoral and femoral bifurcation on the right are widely patent. The superficial femoral artery is also widely patent as is the popliteal artery. Popliteal trifurcation is patent with three-vessel runoff to the level of the right ankle. Anterior and posterior tibial arteries continue into the foot. LEFT Lower Extremity Inflow: Atherosclerotic calcifications of the iliac vessels are seen without focal  stenosis. Runoff: Common femoral artery and femoral bifurcation are patent. Superficial femoral artery and popliteal artery are widely patent as well. Popliteal trifurcation is patent with three-vessel runoff to the left ankle. Veins: No specific venous abnormality is noted. Review of the MIP images confirms the above findings. NON-VASCULAR Lower chest: Lung bases are free of acute infiltrate or sizable effusion. Hepatobiliary: No focal liver abnormality is seen. Status post cholecystectomy. No biliary dilatation. Pancreas: Unremarkable. No pancreatic ductal dilatation or surrounding inflammatory changes. Spleen: Normal in size without focal abnormality. Adrenals/Urinary Tract: Adrenal glands are within normal limits. Kidneys demonstrate multiple renal cysts bilaterally some of which are hyperdense in nature. These appear stable in appearance from a prior MRI from 2021. Given their long-term stability no further follow-up is recommended. No obstructive changes are seen. The bladder is well distended. Stomach/Bowel: Scattered fecal material is noted throughout the colon. A knuckle of transverse colon extends into an umbilical fat containing hernia without complicating factors. The appendix is within normal limits. Small bowel and stomach are unremarkable. Lymphatic: No enlarged abdominal or pelvic lymph nodes. Reproductive: Calcified uterine fibroids are noted. No adnexal mass is noted. Other: No free fluid is seen. Umbilical fat containing hernia is noted as previously described. Musculoskeletal: Degenerative changes of lumbar spine are noted. Right knee prosthesis is seen. Just above the knee prosthesis there is a comminuted fracture of the distal femoral metaphysis without significant displacement. No other acute bony abnormality is noted. No patellar dislocation is noted. IMPRESSION: VASCULAR Scattered atherosclerotic calcifications No significant vascular abnormality is noted to correspond with the given  clinical history. NON-VASCULAR Comminuted distal right femoral fracture involving the metaphysis just above the knee prosthesis. Multiple renal cysts are noted bilaterally relatively stable from a prior MRI in 2021. No further follow-up is recommended. Fat containing umbilical hernia with a knuckle of transverse colon within. No obstructive changes are noted. Uterine fibroid change. Electronically Signed   By: Alcide Clever M.D.   On: 08/30/2023 22:10   CT Cervical Spine Wo Contrast Result Date: 08/30/2023 CLINICAL DATA:  Status post fall. EXAM: CT CERVICAL SPINE WITHOUT CONTRAST TECHNIQUE: Multidetector CT imaging of the cervical spine was performed without intravenous contrast. Multiplanar CT image reconstructions were also generated. RADIATION DOSE REDUCTION: This exam was performed according to the departmental dose-optimization program which includes automated exposure control, adjustment of the mA and/or kV according to patient size and/or use of iterative reconstruction technique. COMPARISON:  December 12, 2020 FINDINGS: Alignment: Normal. Skull base and vertebrae: No acute fracture. No primary bone lesion or focal pathologic process. Soft tissues and spinal canal: No prevertebral fluid or swelling. No visible canal hematoma. Disc levels: Mild endplate sclerosis, moderate severity anterior osteophyte formation and  moderate severity posterior bony spurring are seen at the levels of C3-C4, C4-C5, C5-C6 and C6-C7. Moderate severity intervertebral disc space narrowing is seen at C5-C6 and C6-C7, with mild intervertebral disc space narrowing present throughout the remainder of the cervical spine. Bilateral marked severity multilevel facet joint hypertrophy is noted. Upper chest: Negative. Other: None. IMPRESSION: 1. No acute fracture or subluxation in the cervical spine. 2. Moderate severity multilevel degenerative changes, as described above. Electronically Signed   By: Aram Candela M.D.   On: 08/30/2023  19:53   CT Head Wo Contrast Result Date: 08/30/2023 CLINICAL DATA:  Status post fall. EXAM: CT HEAD WITHOUT CONTRAST TECHNIQUE: Contiguous axial images were obtained from the base of the skull through the vertex without intravenous contrast. RADIATION DOSE REDUCTION: This exam was performed according to the departmental dose-optimization program which includes automated exposure control, adjustment of the mA and/or kV according to patient size and/or use of iterative reconstruction technique. COMPARISON:  March 15, 2023 FINDINGS: Brain: There is mild cerebral atrophy with widening of the extra-axial spaces and ventricular dilatation. There are areas of decreased attenuation within the white matter tracts of the supratentorial brain, consistent with microvascular disease changes. Vascular: Marked severity bilateral cavernous carotid artery calcification is noted. Skull: Normal. Negative for fracture or focal lesion. Sinuses/Orbits: No acute finding. Other: It should be noted that the study is markedly limited secondary to patient motion. IMPRESSION: 1. Markedly limited study secondary to patient motion. 2. Generalized cerebral atrophy with chronic white matter small vessel ischemic changes. 3. No acute intracranial abnormality. Electronically Signed   By: Aram Candela M.D.   On: 08/30/2023 19:50    Pertinent labs & imaging results that were available during my care of the patient were reviewed by me and considered in my medical decision making (see MDM for details).  Medications Ordered in ED Medications  oxyCODONE-acetaminophen (PERCOCET/ROXICET) 5-325 MG per tablet 1 tablet (1 tablet Oral Given 08/30/23 1718)  iohexol (OMNIPAQUE) 350 MG/ML injection 80 mL (80 mLs Intravenous Contrast Given 08/30/23 2155)  oxyCODONE (Oxy IR/ROXICODONE) immediate release tablet 5 mg (5 mg Oral Given 08/30/23 2236)  ondansetron (ZOFRAN) injection 4 mg (4 mg Intravenous Given 08/30/23 2236)                                                                                                                                      Procedures Procedures  (including critical care time)  Medical Decision Making / ED Course   This patient presents to the ED for concern of fall, this involves an extensive number of treatment options, and is a complaint that carries with it a high risk of complications and morbidity.  The differential diagnosis includes Head contusion, intracranial hemorrhage, popliteal injury, knee dislocation, knee fracture.  MDM: Will obtain CT imaging the patient's head and neck.  Will obtain imaging of the patient's right extremity.  With the report of feeling her  knee pop, do of concern for which he will injury or knee dislocation given the patient's elevated BMI.  Reassessment 10 PM-patient with a fracture of the right femur.  Plain films have been ordered.  No evidence of arterial injury in the right leg.  Did speak with orthopedic surgery.  They recommend knee immobilizer, hospitalist admission.  Will admit patient to hospitalist.   Additional history obtained: -Additional history obtained from daughter at bedside -External records from outside source obtained and reviewed including: Chart review including previous notes, labs, imaging, consultation notes   Lab Tests: -I ordered, reviewed, and interpreted labs.   The pertinent results include:   Labs Reviewed  BASIC METABOLIC PANEL - Abnormal; Notable for the following components:      Result Value   Chloride 97 (*)    Glucose, Bld 130 (*)    Creatinine, Ser 1.49 (*)    GFR, Estimated 34 (*)    All other components within normal limits  CBC WITH DIFFERENTIAL/PLATELET - Abnormal; Notable for the following components:   Hemoglobin 11.6 (*)    All other components within normal limits      EKG   EKG Interpretation Date/Time:    Ventricular Rate:    PR Interval:    QRS Duration:    QT Interval:    QTC Calculation:   R  Axis:      Text Interpretation:           Imaging Studies ordered: I ordered imaging studies including CTA of the lower extremity, plain films of the knee and femur I independently visualized and interpreted imaging. I agree with the radiologist interpretation   Medicines ordered and prescription drug management: Meds ordered this encounter  Medications   oxyCODONE-acetaminophen (PERCOCET/ROXICET) 5-325 MG per tablet 1 tablet    Refill:  0   iohexol (OMNIPAQUE) 350 MG/ML injection 80 mL   oxyCODONE (Oxy IR/ROXICODONE) immediate release tablet 5 mg    Refill:  0   ondansetron (ZOFRAN) injection 4 mg    -I have reviewed the patients home medicines and have made adjustments as needed   Consultations Obtained: I requested consultation with the orthopedic surgery,  and discussed lab and imaging findings as well as pertinent plan - they recommend: Admission   Cardiac Monitoring: The patient was maintained on a cardiac monitor.  I personally viewed and interpreted the cardiac monitored which showed an underlying rhythm of: Sinus rhythm  Social Determinants of Health:  Factors impacting patients care include:    Reevaluation: After the interventions noted above, I reevaluated the patient and found that they have :improved  Co morbidities that complicate the patient evaluation  Past Medical History:  Diagnosis Date   Anemia    Arthritis    Carotid arterial disease (HCC)    COVID-19    Diabetes mellitus    Hyperlipidemia    Hypertension    Migraine    Obesity    Reflux    Sleep apnea       Dispostion: Admission to hospitalist service, orthopedic consultation.     Final Clinical Impression(s) / ED Diagnoses Final diagnoses:  Closed comminuted intertrochanteric fracture of right femur, initial encounter (HCC)     @PCDICTATION @    Anders Simmonds T, DO 08/30/23 2240

## 2023-08-31 ENCOUNTER — Encounter (HOSPITAL_COMMUNITY)
Admission: EM | Disposition: A | Discharge status: Skilled Nursing Facility | Payer: Self-pay | Source: Home / Self Care | Attending: Internal Medicine

## 2023-08-31 ENCOUNTER — Inpatient Hospital Stay (HOSPITAL_COMMUNITY): Payer: Medicare Other | Admitting: Certified Registered Nurse Anesthetist

## 2023-08-31 ENCOUNTER — Inpatient Hospital Stay (HOSPITAL_COMMUNITY): Payer: Medicare Other

## 2023-08-31 ENCOUNTER — Other Ambulatory Visit: Payer: Self-pay

## 2023-08-31 ENCOUNTER — Encounter (HOSPITAL_COMMUNITY): Payer: Self-pay | Admitting: Internal Medicine

## 2023-08-31 ENCOUNTER — Emergency Department (HOSPITAL_COMMUNITY): Payer: Medicare Other

## 2023-08-31 DIAGNOSIS — I130 Hypertensive heart and chronic kidney disease with heart failure and stage 1 through stage 4 chronic kidney disease, or unspecified chronic kidney disease: Secondary | ICD-10-CM | POA: Diagnosis present

## 2023-08-31 DIAGNOSIS — Z6841 Body Mass Index (BMI) 40.0 and over, adult: Secondary | ICD-10-CM | POA: Diagnosis not present

## 2023-08-31 DIAGNOSIS — I509 Heart failure, unspecified: Secondary | ICD-10-CM | POA: Diagnosis not present

## 2023-08-31 DIAGNOSIS — S72401A Unspecified fracture of lower end of right femur, initial encounter for closed fracture: Secondary | ICD-10-CM | POA: Diagnosis not present

## 2023-08-31 DIAGNOSIS — E1169 Type 2 diabetes mellitus with other specified complication: Secondary | ICD-10-CM

## 2023-08-31 DIAGNOSIS — S72001A Fracture of unspecified part of neck of right femur, initial encounter for closed fracture: Secondary | ICD-10-CM

## 2023-08-31 DIAGNOSIS — K219 Gastro-esophageal reflux disease without esophagitis: Secondary | ICD-10-CM | POA: Diagnosis present

## 2023-08-31 DIAGNOSIS — Y92015 Private garage of single-family (private) house as the place of occurrence of the external cause: Secondary | ICD-10-CM | POA: Diagnosis not present

## 2023-08-31 DIAGNOSIS — S728X1A Other fracture of right femur, initial encounter for closed fracture: Secondary | ICD-10-CM | POA: Diagnosis not present

## 2023-08-31 DIAGNOSIS — S72451A Displaced supracondylar fracture without intracondylar extension of lower end of right femur, initial encounter for closed fracture: Secondary | ICD-10-CM | POA: Diagnosis present

## 2023-08-31 DIAGNOSIS — Z888 Allergy status to other drugs, medicaments and biological substances status: Secondary | ICD-10-CM | POA: Diagnosis not present

## 2023-08-31 DIAGNOSIS — Z794 Long term (current) use of insulin: Secondary | ICD-10-CM | POA: Diagnosis not present

## 2023-08-31 DIAGNOSIS — S72031S Displaced midcervical fracture of right femur, sequela: Secondary | ICD-10-CM | POA: Diagnosis not present

## 2023-08-31 DIAGNOSIS — E1165 Type 2 diabetes mellitus with hyperglycemia: Secondary | ICD-10-CM | POA: Diagnosis present

## 2023-08-31 DIAGNOSIS — I2510 Atherosclerotic heart disease of native coronary artery without angina pectoris: Secondary | ICD-10-CM | POA: Diagnosis present

## 2023-08-31 DIAGNOSIS — I25118 Atherosclerotic heart disease of native coronary artery with other forms of angina pectoris: Secondary | ICD-10-CM

## 2023-08-31 DIAGNOSIS — Z880 Allergy status to penicillin: Secondary | ICD-10-CM | POA: Diagnosis not present

## 2023-08-31 DIAGNOSIS — E1122 Type 2 diabetes mellitus with diabetic chronic kidney disease: Secondary | ICD-10-CM

## 2023-08-31 DIAGNOSIS — W010XXA Fall on same level from slipping, tripping and stumbling without subsequent striking against object, initial encounter: Secondary | ICD-10-CM | POA: Diagnosis present

## 2023-08-31 DIAGNOSIS — I5032 Chronic diastolic (congestive) heart failure: Secondary | ICD-10-CM | POA: Diagnosis present

## 2023-08-31 DIAGNOSIS — G4733 Obstructive sleep apnea (adult) (pediatric): Secondary | ICD-10-CM | POA: Diagnosis present

## 2023-08-31 DIAGNOSIS — M25551 Pain in right hip: Secondary | ICD-10-CM | POA: Diagnosis present

## 2023-08-31 DIAGNOSIS — D62 Acute posthemorrhagic anemia: Secondary | ICD-10-CM | POA: Diagnosis not present

## 2023-08-31 DIAGNOSIS — Z7982 Long term (current) use of aspirin: Secondary | ICD-10-CM | POA: Diagnosis not present

## 2023-08-31 DIAGNOSIS — N1832 Chronic kidney disease, stage 3b: Secondary | ICD-10-CM | POA: Diagnosis present

## 2023-08-31 DIAGNOSIS — G43909 Migraine, unspecified, not intractable, without status migrainosus: Secondary | ICD-10-CM | POA: Diagnosis present

## 2023-08-31 DIAGNOSIS — Z79899 Other long term (current) drug therapy: Secondary | ICD-10-CM | POA: Diagnosis not present

## 2023-08-31 DIAGNOSIS — E785 Hyperlipidemia, unspecified: Secondary | ICD-10-CM | POA: Diagnosis present

## 2023-08-31 DIAGNOSIS — Z885 Allergy status to narcotic agent status: Secondary | ICD-10-CM | POA: Diagnosis not present

## 2023-08-31 DIAGNOSIS — M1990 Unspecified osteoarthritis, unspecified site: Secondary | ICD-10-CM | POA: Diagnosis present

## 2023-08-31 DIAGNOSIS — Z8249 Family history of ischemic heart disease and other diseases of the circulatory system: Secondary | ICD-10-CM | POA: Diagnosis not present

## 2023-08-31 DIAGNOSIS — M9711XA Periprosthetic fracture around internal prosthetic right knee joint, initial encounter: Secondary | ICD-10-CM | POA: Diagnosis present

## 2023-08-31 DIAGNOSIS — S72364A Nondisplaced segmental fracture of shaft of right femur, initial encounter for closed fracture: Secondary | ICD-10-CM

## 2023-08-31 DIAGNOSIS — S72031A Displaced midcervical fracture of right femur, initial encounter for closed fracture: Secondary | ICD-10-CM | POA: Diagnosis not present

## 2023-08-31 DIAGNOSIS — S72009A Fracture of unspecified part of neck of unspecified femur, initial encounter for closed fracture: Secondary | ICD-10-CM | POA: Diagnosis present

## 2023-08-31 HISTORY — PX: ORIF FEMUR FRACTURE: SHX2119

## 2023-08-31 LAB — GLUCOSE, CAPILLARY
Glucose-Capillary: 187 mg/dL — ABNORMAL HIGH (ref 70–99)
Glucose-Capillary: 148 mg/dL — ABNORMAL HIGH (ref 70–99)
Glucose-Capillary: 163 mg/dL — ABNORMAL HIGH (ref 70–99)
Glucose-Capillary: 169 mg/dL — ABNORMAL HIGH (ref 70–99)
Glucose-Capillary: 184 mg/dL — ABNORMAL HIGH (ref 70–99)

## 2023-08-31 LAB — SURGICAL PCR SCREEN
MRSA, PCR: NEGATIVE
Staphylococcus aureus: POSITIVE — AB

## 2023-08-31 LAB — BASIC METABOLIC PANEL
Anion gap: 10 (ref 5–15)
BUN: 18 mg/dL (ref 8–23)
CO2: 29 mmol/L (ref 22–32)
Calcium: 9 mg/dL (ref 8.9–10.3)
Chloride: 95 mmol/L — ABNORMAL LOW (ref 98–111)
Creatinine, Ser: 1.7 mg/dL — ABNORMAL HIGH (ref 0.44–1.00)
GFR, Estimated: 29 mL/min — ABNORMAL LOW (ref >60)
Glucose, Bld: 213 mg/dL — ABNORMAL HIGH (ref 70–99)
Potassium: 4.5 mmol/L (ref 3.5–5.1)
Sodium: 134 mmol/L — ABNORMAL LOW (ref 135–145)

## 2023-08-31 LAB — TYPE AND SCREEN
ABO/RH(D): B POS
Antibody Screen: NEGATIVE

## 2023-08-31 LAB — CBC
HCT: 35 % — ABNORMAL LOW (ref 36.0–46.0)
Hemoglobin: 11.1 g/dL — ABNORMAL LOW (ref 12.0–15.0)
MCH: 26.8 pg (ref 26.0–34.0)
MCHC: 31.7 g/dL (ref 30.0–36.0)
MCV: 84.5 fL (ref 80.0–100.0)
Platelets: 191 10*3/uL (ref 150–400)
RBC: 4.14 MIL/uL (ref 3.87–5.11)
RDW: 14 % (ref 11.5–15.5)
WBC: 8.2 10*3/uL (ref 4.0–10.5)
nRBC: 0 % (ref 0.0–0.2)

## 2023-08-31 LAB — ABO/RH: ABO/RH(D): B POS

## 2023-08-31 LAB — CBG MONITORING, ED: Glucose-Capillary: 204 mg/dL — ABNORMAL HIGH (ref 70–99)

## 2023-08-31 SURGERY — OPEN REDUCTION INTERNAL FIXATION (ORIF) DISTAL FEMUR FRACTURE
Anesthesia: General | Laterality: Right

## 2023-08-31 MED ORDER — DOCUSATE SODIUM 100 MG PO CAPS
100.0000 mg | ORAL_CAPSULE | Freq: Two times a day (BID) | ORAL | Status: DC
Start: 2023-08-31 — End: 2023-09-04
  Administered 2023-08-31 – 2023-09-04 (×9): 100 mg via ORAL
  Filled 2023-08-31 (×9): qty 1

## 2023-08-31 MED ORDER — ROCURONIUM BROMIDE 10 MG/ML (PF) SYRINGE
PREFILLED_SYRINGE | INTRAVENOUS | Status: DC | PRN
  Administered 2023-08-31: 60 mg via INTRAVENOUS

## 2023-08-31 MED ORDER — CHLORHEXIDINE GLUCONATE 0.12 % MT SOLN
OROMUCOSAL | Status: AC
  Administered 2023-08-31: 15 mL via OROMUCOSAL
  Filled 2023-08-31: qty 15

## 2023-08-31 MED ORDER — ACETAMINOPHEN 325 MG PO TABS
650.0000 mg | ORAL_TABLET | Freq: Four times a day (QID) | ORAL | Status: AC
  Administered 2023-08-31 – 2023-09-01 (×3): 650 mg via ORAL
  Filled 2023-08-31 (×4): qty 2

## 2023-08-31 MED ORDER — HYDROCODONE-ACETAMINOPHEN 5-325 MG PO TABS: 1.0000 | ORAL_TABLET | Freq: Four times a day (QID) | ORAL | Status: DC | PRN

## 2023-08-31 MED ORDER — POLYETHYLENE GLYCOL 3350 17 G PO PACK
17.0000 g | PACK | Freq: Every day | ORAL | Status: DC | PRN
  Administered 2023-09-04: 17 g via ORAL
  Filled 2023-08-31: qty 1

## 2023-08-31 MED ORDER — DEXAMETHASONE SODIUM PHOSPHATE 10 MG/ML IJ SOLN
INTRAMUSCULAR | Status: DC | PRN
  Administered 2023-08-31: 5 mg via INTRAVENOUS

## 2023-08-31 MED ORDER — HYDRALAZINE HCL 25 MG PO TABS
25.0000 mg | ORAL_TABLET | Freq: Three times a day (TID) | ORAL | Status: DC
  Administered 2023-08-31 – 2023-09-01 (×3): 25 mg via ORAL
  Filled 2023-08-31 (×3): qty 1

## 2023-08-31 MED ORDER — MUPIROCIN 2 % EX OINT: 1.0000 | TOPICAL_OINTMENT | Freq: Two times a day (BID) | CUTANEOUS | 0 refills | Status: AC

## 2023-08-31 MED ORDER — FENTANYL CITRATE (PF) 250 MCG/5ML IJ SOLN
INTRAMUSCULAR | Status: DC | PRN
  Administered 2023-08-31 (×3): 50 ug via INTRAVENOUS

## 2023-08-31 MED ORDER — INSULIN ASPART 100 UNIT/ML IJ SOLN
INTRAMUSCULAR | Status: AC
  Administered 2023-08-31: 2 [IU] via SUBCUTANEOUS
  Filled 2023-08-31: qty 1

## 2023-08-31 MED ORDER — 0.9 % SODIUM CHLORIDE (POUR BTL) OPTIME
TOPICAL | Status: DC | PRN
  Administered 2023-08-31: 1000 mL

## 2023-08-31 MED ORDER — PANTOPRAZOLE SODIUM 40 MG IV SOLR
40.0000 mg | Freq: Every day | INTRAVENOUS | Status: DC
  Administered 2023-09-01: 40 mg via INTRAVENOUS
  Filled 2023-08-31: qty 10

## 2023-08-31 MED ORDER — INSULIN GLARGINE-YFGN 100 UNIT/ML ~~LOC~~ SOLN
35.0000 [IU] | Freq: Every day | SUBCUTANEOUS | Status: DC
  Administered 2023-09-01 – 2023-09-04 (×4): 35 [IU] via SUBCUTANEOUS
  Filled 2023-08-31 (×5): qty 0.35

## 2023-08-31 MED ORDER — FENTANYL CITRATE (PF) 250 MCG/5ML IJ SOLN
INTRAMUSCULAR | Status: AC
  Filled 2023-08-31: qty 5

## 2023-08-31 MED ORDER — SODIUM CHLORIDE 0.9 % IV SOLN
INTRAVENOUS | Status: AC
  Filled 2023-08-31: qty 3

## 2023-08-31 MED ORDER — PROPOFOL 10 MG/ML IV BOLUS
INTRAVENOUS | Status: DC | PRN
  Administered 2023-08-31: 200 mg via INTRAVENOUS

## 2023-08-31 MED ORDER — ESCITALOPRAM OXALATE 10 MG PO TABS
20.0000 mg | ORAL_TABLET | Freq: Every day | ORAL | Status: DC
Start: 2023-08-31 — End: 2023-09-04
  Administered 2023-08-31 – 2023-09-04 (×5): 20 mg via ORAL
  Filled 2023-08-31 (×5): qty 2

## 2023-08-31 MED ORDER — ALLOPURINOL 100 MG PO TABS
100.0000 mg | ORAL_TABLET | Freq: Every day | ORAL | Status: DC
  Administered 2023-09-01 – 2023-09-04 (×4): 100 mg via ORAL
  Filled 2023-08-31 (×4): qty 1

## 2023-08-31 MED ORDER — ONDANSETRON HCL 4 MG/2ML IJ SOLN
INTRAMUSCULAR | Status: DC | PRN
  Administered 2023-08-31: 4 mg via INTRAVENOUS

## 2023-08-31 MED ORDER — EZETIMIBE 10 MG PO TABS
10.0000 mg | ORAL_TABLET | Freq: Every day | ORAL | Status: DC
  Administered 2023-08-31 – 2023-09-04 (×5): 10 mg via ORAL
  Filled 2023-08-31 (×5): qty 1

## 2023-08-31 MED ORDER — CHLORHEXIDINE GLUCONATE 0.12 % MT SOLN: 15.0000 mL | Freq: Once | OROMUCOSAL | Status: AC

## 2023-08-31 MED ORDER — TRANEXAMIC ACID-NACL 1000-0.7 MG/100ML-% IV SOLN
1000.0000 mg | Freq: Once | INTRAVENOUS | Status: AC
  Administered 2023-08-31: 1000 mg via INTRAVENOUS
  Filled 2023-08-31: qty 100

## 2023-08-31 MED ORDER — CEFAZOLIN SODIUM-DEXTROSE 2-4 GM/100ML-% IV SOLN
2.0000 g | Freq: Three times a day (TID) | INTRAVENOUS | Status: AC
  Administered 2023-08-31 – 2023-09-01 (×3): 2 g via INTRAVENOUS
  Filled 2023-08-31 (×3): qty 100

## 2023-08-31 MED ORDER — ENOXAPARIN SODIUM 40 MG/0.4ML IJ SOSY
40.0000 mg | PREFILLED_SYRINGE | INTRAMUSCULAR | Status: DC
  Administered 2023-09-01: 40 mg via SUBCUTANEOUS
  Filled 2023-08-31: qty 0.4

## 2023-08-31 MED ORDER — METHOCARBAMOL 500 MG PO TABS
500.0000 mg | ORAL_TABLET | Freq: Four times a day (QID) | ORAL | Status: DC | PRN
  Administered 2023-08-31 – 2023-09-03 (×3): 500 mg via ORAL
  Filled 2023-08-31 (×3): qty 1

## 2023-08-31 MED ORDER — CHLORHEXIDINE GLUCONATE 4 % EX SOLN: 1.0000 | CUTANEOUS | 1 refills | Status: DC

## 2023-08-31 MED ORDER — PHENYLEPHRINE HCL-NACL 20-0.9 MG/250ML-% IV SOLN
INTRAVENOUS | Status: DC | PRN
  Administered 2023-08-31: 25 ug/min via INTRAVENOUS

## 2023-08-31 MED ORDER — METOCLOPRAMIDE HCL 5 MG PO TABS
5.0000 mg | ORAL_TABLET | Freq: Three times a day (TID) | ORAL | Status: DC | PRN
Start: 2023-08-31 — End: 2023-09-04

## 2023-08-31 MED ORDER — ORAL CARE MOUTH RINSE: 15.0000 mL | Freq: Once | OROMUCOSAL | Status: AC

## 2023-08-31 MED ORDER — METHOCARBAMOL 1000 MG/10ML IJ SOLN: 500.0000 mg | Freq: Four times a day (QID) | INTRAMUSCULAR | Status: DC | PRN

## 2023-08-31 MED ORDER — HYDROMORPHONE HCL 1 MG/ML IJ SOLN
0.2500 mg | INTRAMUSCULAR | Status: DC | PRN
  Administered 2023-08-31 (×2): 0.5 mg via INTRAVENOUS

## 2023-08-31 MED ORDER — SODIUM CHLORIDE 0.9 % IV SOLN
3.0000 g | Freq: Once | INTRAVENOUS | Status: AC
  Administered 2023-08-31: 3 g via INTRAVENOUS

## 2023-08-31 MED ORDER — OXYCODONE HCL 5 MG PO TABS
5.0000 mg | ORAL_TABLET | ORAL | Status: DC | PRN
  Administered 2023-08-31 – 2023-09-03 (×3): 5 mg via ORAL
  Filled 2023-08-31 (×3): qty 1

## 2023-08-31 MED ORDER — ACETAMINOPHEN 10 MG/ML IV SOLN
INTRAVENOUS | Status: AC
  Filled 2023-08-31: qty 100

## 2023-08-31 MED ORDER — NITROGLYCERIN 0.4 MG SL SUBL: 0.4000 mg | SUBLINGUAL_TABLET | SUBLINGUAL | Status: DC | PRN

## 2023-08-31 MED ORDER — INSULIN ASPART 100 UNIT/ML IJ SOLN: 0.0000 [IU] | INTRAMUSCULAR | Status: DC | PRN

## 2023-08-31 MED ORDER — ADULT MULTIVITAMIN W/MINERALS CH
1.0000 | ORAL_TABLET | Freq: Every day | ORAL | Status: DC
Start: 2023-08-31 — End: 2023-09-04
  Administered 2023-08-31 – 2023-09-04 (×5): 1 via ORAL
  Filled 2023-08-31 (×5): qty 1

## 2023-08-31 MED ORDER — PROPOFOL 10 MG/ML IV BOLUS
INTRAVENOUS | Status: AC
  Filled 2023-08-31: qty 20

## 2023-08-31 MED ORDER — METOPROLOL SUCCINATE ER 50 MG PO TB24
50.0000 mg | ORAL_TABLET | Freq: Every day | ORAL | Status: DC
  Administered 2023-08-31 – 2023-09-03 (×4): 50 mg via ORAL
  Filled 2023-08-31 (×4): qty 1

## 2023-08-31 MED ORDER — ONDANSETRON HCL 4 MG PO TABS: 4.0000 mg | ORAL_TABLET | Freq: Four times a day (QID) | ORAL | Status: DC | PRN

## 2023-08-31 MED ORDER — ONDANSETRON HCL 4 MG/2ML IJ SOLN: 4.0000 mg | Freq: Four times a day (QID) | INTRAMUSCULAR | Status: DC | PRN

## 2023-08-31 MED ORDER — DEXTROSE 50 % IV SOLN: 12.5000 g | INTRAVENOUS | Status: AC

## 2023-08-31 MED ORDER — FENTANYL CITRATE PF 50 MCG/ML IJ SOSY
50.0000 ug | PREFILLED_SYRINGE | Freq: Once | INTRAMUSCULAR | Status: AC
  Administered 2023-08-31: 50 ug via INTRAVENOUS
  Filled 2023-08-31: qty 1

## 2023-08-31 MED ORDER — PHENYLEPHRINE 80 MCG/ML (10ML) SYRINGE FOR IV PUSH (FOR BLOOD PRESSURE SUPPORT)
PREFILLED_SYRINGE | INTRAVENOUS | Status: DC | PRN
  Administered 2023-08-31 (×3): 80 ug via INTRAVENOUS
  Administered 2023-08-31: 160 ug via INTRAVENOUS

## 2023-08-31 MED ORDER — AMLODIPINE BESYLATE 5 MG PO TABS
5.0000 mg | ORAL_TABLET | Freq: Every day | ORAL | Status: DC
Start: 2023-08-31 — End: 2023-09-04
  Administered 2023-08-31 – 2023-09-04 (×5): 5 mg via ORAL
  Filled 2023-08-31 (×5): qty 1

## 2023-08-31 MED ORDER — LACTATED RINGERS IV SOLN: INTRAVENOUS | Status: DC

## 2023-08-31 MED ORDER — MORPHINE SULFATE (PF) 2 MG/ML IV SOLN
0.5000 mg | INTRAVENOUS | Status: DC | PRN
  Administered 2023-08-31 – 2023-09-01 (×3): 0.5 mg via INTRAVENOUS
  Filled 2023-08-31 (×3): qty 1

## 2023-08-31 MED ORDER — METOCLOPRAMIDE HCL 5 MG/ML IJ SOLN: 5.0000 mg | Freq: Three times a day (TID) | INTRAMUSCULAR | Status: DC | PRN

## 2023-08-31 MED ORDER — VANCOMYCIN HCL 1000 MG IV SOLR
INTRAVENOUS | Status: AC
Start: 2023-08-31 — End: ?
  Filled 2023-08-31: qty 20

## 2023-08-31 MED ORDER — ISOSORBIDE MONONITRATE ER 60 MG PO TB24
60.0000 mg | ORAL_TABLET | Freq: Every day | ORAL | Status: DC
  Administered 2023-09-01 – 2023-09-04 (×4): 60 mg via ORAL
  Filled 2023-08-31 (×4): qty 1

## 2023-08-31 MED ORDER — ACETAMINOPHEN 10 MG/ML IV SOLN
INTRAVENOUS | Status: DC | PRN
  Administered 2023-08-31: 1000 mg via INTRAVENOUS

## 2023-08-31 MED ORDER — SUGAMMADEX SODIUM 200 MG/2ML IV SOLN
INTRAVENOUS | Status: DC | PRN
  Administered 2023-08-31: 400 mg via INTRAVENOUS

## 2023-08-31 MED ORDER — VANCOMYCIN HCL 1000 MG IV SOLR
INTRAVENOUS | Status: DC | PRN
  Administered 2023-08-31: 1000 mg

## 2023-08-31 MED ORDER — PROSOURCE PLUS PO LIQD
30.0000 mL | Freq: Two times a day (BID) | ORAL | Status: DC
Start: 2023-09-01 — End: 2023-09-04
  Administered 2023-09-01 – 2023-09-04 (×7): 30 mL via ORAL
  Filled 2023-08-31 (×8): qty 30

## 2023-08-31 MED ORDER — HYDROMORPHONE HCL 1 MG/ML IJ SOLN
INTRAMUSCULAR | Status: AC
  Filled 2023-08-31: qty 1

## 2023-08-31 MED ORDER — SODIUM CHLORIDE 0.9 % IV SOLN: INTRAVENOUS | Status: AC

## 2023-08-31 MED ORDER — LIDOCAINE 2% (20 MG/ML) 5 ML SYRINGE
INTRAMUSCULAR | Status: DC | PRN
  Administered 2023-08-31: 80 mg via INTRAVENOUS

## 2023-08-31 SURGICAL SUPPLY — 63 items
BAG COUNTER SPONGE SURGICOUNT (BAG) ×2 IMPLANT
BIT DRILL 4.3 (BIT) ×1
BIT DRILL 4.3X300MM (BIT) IMPLANT
BIT DRILL LONG 3.3 (BIT) IMPLANT
BIT DRILL QC 3.3X195 (BIT) IMPLANT
BLADE CLIPPER SURG (BLADE) IMPLANT
BNDG COHESIVE 6X5 TAN ST LF (GAUZE/BANDAGES/DRESSINGS) ×2 IMPLANT
BNDG ELASTIC 4INX 5YD STR LF (GAUZE/BANDAGES/DRESSINGS) IMPLANT
BNDG ELASTIC 6INX 5YD STR LF (GAUZE/BANDAGES/DRESSINGS) IMPLANT
BNDG ELASTIC 6X10 VLCR STRL LF (GAUZE/BANDAGES/DRESSINGS) ×2 IMPLANT
BRUSH SCRUB EZ PLAIN DRY (MISCELLANEOUS) ×4 IMPLANT
CANISTER SUCT 3000ML PPV (MISCELLANEOUS) ×2 IMPLANT
CAP LOCK NCB (Cap) IMPLANT
CHLORAPREP W/TINT 26 (MISCELLANEOUS) ×2 IMPLANT
COVER SURGICAL LIGHT HANDLE (MISCELLANEOUS) ×2 IMPLANT
DRAPE C-ARM 42X72 X-RAY (DRAPES) ×2 IMPLANT
DRAPE C-ARMOR (DRAPES) ×2 IMPLANT
DRAPE HALF SHEET 40X57 (DRAPES) ×4 IMPLANT
DRAPE SURG 17X23 STRL (DRAPES) ×2 IMPLANT
DRAPE SURG ORHT 6 SPLT 77X108 (DRAPES) ×4 IMPLANT
DRAPE U-SHAPE 47X51 STRL (DRAPES) ×2 IMPLANT
DRESSING MEPILEX FLEX 4X4 (GAUZE/BANDAGES/DRESSINGS) IMPLANT
DRSG ADAPTIC 3X8 NADH LF (GAUZE/BANDAGES/DRESSINGS) IMPLANT
DRSG MEPILEX FLEX 4X4 (GAUZE/BANDAGES/DRESSINGS)
DRSG MEPILEX POST OP 4X12 (GAUZE/BANDAGES/DRESSINGS) IMPLANT
DRSG MEPILEX POST OP 4X8 (GAUZE/BANDAGES/DRESSINGS) IMPLANT
ELECT REM PT RETURN 9FT ADLT (ELECTROSURGICAL) ×1
ELECTRODE REM PT RTRN 9FT ADLT (ELECTROSURGICAL) ×2 IMPLANT
GAUZE PAD ABD 8X10 STRL (GAUZE/BANDAGES/DRESSINGS) ×6 IMPLANT
GAUZE SPONGE 4X4 12PLY STRL (GAUZE/BANDAGES/DRESSINGS) ×2 IMPLANT
GLOVE BIO SURGEON STRL SZ 6.5 (GLOVE) ×6 IMPLANT
GLOVE BIO SURGEON STRL SZ7.5 (GLOVE) ×8 IMPLANT
GLOVE BIOGEL PI IND STRL 6.5 (GLOVE) ×2 IMPLANT
GLOVE BIOGEL PI IND STRL 7.5 (GLOVE) ×2 IMPLANT
GOWN STRL REUS W/ TWL LRG LVL3 (GOWN DISPOSABLE) ×6 IMPLANT
K-WIRE FXSTD 280X2XNS SS (WIRE) ×1
KIT BASIN OR (CUSTOM PROCEDURE TRAY) ×2 IMPLANT
KIT TURNOVER KIT B (KITS) ×2 IMPLANT
KWIRE FXSTD 280X2XNS SS (WIRE) IMPLANT
NS IRRIG 1000ML POUR BTL (IV SOLUTION) ×2 IMPLANT
PACK TOTAL JOINT (CUSTOM PROCEDURE TRAY) ×2 IMPLANT
PAD ARMBOARD 7.5X6 YLW CONV (MISCELLANEOUS) ×4 IMPLANT
PAD CAST 4YDX4 CTTN HI CHSV (CAST SUPPLIES) ×2 IMPLANT
PADDING CAST COTTON 6X4 STRL (CAST SUPPLIES) ×2 IMPLANT
PADDING CAST SYNTHETIC 4X4 STR (CAST SUPPLIES) IMPLANT
PLATE FEM DIST NCB PP 278MM (Plate) IMPLANT
SCREW 5.0 80MM (Screw) IMPLANT
SCREW CORTICAL NCB 5.0X40 (Screw) IMPLANT
SCREW NCB 4.0MX38M (Screw) IMPLANT
SCREW NCB 4.0MX65M (Screw) IMPLANT
SCREW NCB 5.0X38 (Screw) IMPLANT
SCREW NCB 5.0X85MM (Screw) IMPLANT
SPONGE T-LAP 18X18 ~~LOC~~+RFID (SPONGE) IMPLANT
STAPLER VISISTAT 35W (STAPLE) ×2 IMPLANT
SUCTION TUBE FRAZIER 10FR DISP (SUCTIONS) ×2 IMPLANT
SUT ETHILON 3 0 PS 1 (SUTURE) ×4 IMPLANT
SUT MON AB 2-0 SH27 (SUTURE) IMPLANT
SUT VIC AB 0 CT1 27XBRD ANBCTR (SUTURE) IMPLANT
SUT VIC AB 1 CT1 27XBRD ANBCTR (SUTURE) IMPLANT
SUT VIC AB 2-0 CT1 TAPERPNT 27 (SUTURE) ×4 IMPLANT
TOWEL GREEN STERILE (TOWEL DISPOSABLE) ×4 IMPLANT
TRAY FOLEY MTR SLVR 16FR STAT (SET/KITS/TRAYS/PACK) IMPLANT
WATER STERILE IRR 1000ML POUR (IV SOLUTION) ×4 IMPLANT

## 2023-08-31 NOTE — Transfer of Care (Signed)
Immediate Anesthesia Transfer of Care Note  Patient: Jeanne [name]  Procedure(s) Performed: OPEN REDUCTION INTERNAL FIXATION (ORIF) DISTAL FEMUR FRACTURE (Right)  Patient Location: PACU  Anesthesia Type:General  Level of Consciousness: awake, alert , and oriented  Airway & Oxygen Therapy: Patient Spontanous Breathing and Patient connected to face mask oxygen  Post-op Assessment: Report given to RN and Post -op Vital signs reviewed and stable  Post vital signs: Reviewed and stable  Last Vitals:  Vitals Value Taken Time  BP 174/77 08/31/23 1134  Temp    Pulse 82 08/31/23 1135  Resp 10 08/31/23 1135  SpO2 95 % 08/31/23 1135  Vitals shown include unfiled device data.  Last Pain:  Vitals:   08/31/23 0848  TempSrc:   PainSc: 0-No pain      Patients Stated Pain Goal: 0 (08/31/23 0333)  Complications: No notable events documented.

## 2023-08-31 NOTE — Progress Notes (Addendum)
Initial Nutrition Assessment  DOCUMENTATION CODES:   Not applicable   INTERVENTION:  -Add MVI  -ProSource BID between meals  NUTRITION DIAGNOSIS:  Increased nutrient needs related to hip fracture, post-op healing as evidenced by estimated needs.  GOAL:  Patient will meet greater than or equal to 90% of their needs  MONITOR:  PO intake, Supplement acceptance  REASON FOR ASSESSMENT:   Consult Assessment of nutrition requirement/status  ASSESSMENT:   87 year old female presented to ED after trip and fall while getting into car. Endorses striking her head during the fall and c/o R knee pain. Now found to have R femur fx requiring operative fixation. PMH: CAD, HTN, HLD, T2DM, GERD, CHF, CKD 3.   Difficult to rouse at bedside s/p surgery. Did endorse adequate appetite prior to fall and hospitalization. Will monitor intake as she progresses. No recent issues chewing or swallowing, upper endentulous.   24-Hour Recall: B: coffee, eggs, bacon L: Salad or sandwich D: protein, carb, veg Snack: peanut butter cracker  Admit Weight: 139kg  Unable to provide UBW, but states she has not lost or gained a significant amount of weight in last six months.   Lives at home with her family (daughters) and states she uses a walker to get around at times.  Meds: amlodipine, semglee, pantoprazole, cefazolin, insulin SSI, morphine  Labs:  CBGs 130-213(x2 draws)   NUTRITION - FOCUSED PHYSICAL EXAM:  Flowsheet Row Most Recent Value  Orbital Region No depletion  Upper Arm Region Mild depletion  Thoracic and Lumbar Region No depletion  Buccal Region No depletion  Temple Region Mild depletion  Clavicle Bone Region No depletion  Clavicle and Acromion Bone Region No depletion  Scapular Bone Region No depletion  Dorsal Hand No depletion  Patellar Region No depletion  Anterior Thigh Region No depletion  [unable to assess R d/t surgical site]  Posterior Calf Region Unable to assess  [BLE  edema,  surgical site to R leg]  Edema (RD Assessment) Mild  Hair Reviewed  Eyes Reviewed  Mouth Reviewed  Skin Reviewed  Nails Reviewed      Diet Order:   Diet Order             Diet Carb Modified Fluid consistency: Thin; Room service appropriate? Yes  Diet effective now            EDUCATION NEEDS:   Education needs have been addressed  Skin:  Skin Assessment: Reviewed RN Assessment  Last BM:  12/26  Height:  Ht Readings from Last 1 Encounters:  08/30/23 5\' 3"  (1.6 m)    Weight:  Wt Readings from Last 1 Encounters:  08/30/23 (!) 139 kg    Ideal Body Weight:  52.3 kg  BMI:  Body mass index is 54.28 kg/m.  Estimated Nutritional Needs:   Kcal:  1800-2000 kcal  Protein:  100-110 g  Fluid:  >2   Myrtie Cruise MS, RD, LDN Registered Dietitian Clinical Nutrition RD Inpatient Contact Info in Amion

## 2023-08-31 NOTE — Progress Notes (Signed)
Brief note: -Patient was admitted earlier today with right periprosthetic femoral fracture (right distal femoral fracture) following a mechanical fall. -Patient underwent open reduction and internal fixation of the distal femoral fracture earlier today. -Patient seen alongside patient's daughter. -Patient is resting quietly.  As per H&P done on admission: "Jeanne [name] is a 87 y.o. female with medical history significant of  CAD, hypertension, hyperlipidemia, type 2 diabetes mellitus, GERD, chronic diastolic heart failure, CKD 3B associated baseline creatinine range 1.5-2.0,  obstructive sleep apnea on home nocturnal CPAP, who presents to ED s/p mechanical fall while attempting to get into car.  Patient s/p fall has severe right knee pain and had also had head strike during the fall and complained of pain at the back of her head.  Patient on ros, noted no current chest pain , sob, fever/chills/ n/v/or dysuria, she does however note GERD symptoms of bitter state in her mouth. She does endorse history of chest pain for which she states nitroglycerin, however notes that these episode are rare. She also states she is able to do her activities of daily living without sob or chest pain .   ED Course:  Afeb, BP 185/93, HR 80, rr 16 sat 98%    Patient was evaluated and found to have a right periprosthetic hip fracture.  Orthopedics was consulted who noted plan for surgical repair in the am.   Labs wbc 6.3, plt 208,  hgb 11.6 ( at baseline) BMP Na 135, K 4.3, CL 97, glu 130, cr 1.49 ( at baseline)   CTH  IMPRESSION: 1. Markedly limited study secondary to patient motion. 2. Generalized cerebral atrophy with chronic white matter small vessel ischemic changes. 3. No acute intracranial abnormality. CT cervical spine IMPRESSION: 1. No acute fracture or subluxation in the cervical spine. 2. Moderate severity multilevel degenerative changes, as described above.   Xray  IMPRESSION: 1. Acute  fracture of the distal right femoral shaft. 2. Intact right knee replacement. 3. Large suprapatellar effusion.    CT right knee IMPRESSION: Acute displaced and comminuted distal femoral shaft fracture just proximal to the total knee arthroplasty. Tx: oxycodone, zofran, tranexamic acid clindamycin, fentanyl"   Postop management as directed by orthopedic team. CHF is compensated. Stable chronic kidney disease stage IIIb/IV. Continue management for OSA, hypertension, hyperlipidemia and type 2 diabetes mellitus.  Further management will depend on hospital course.

## 2023-08-31 NOTE — Anesthesia Preprocedure Evaluation (Addendum)
Anesthesia Evaluation  Patient identified by MRN, date of birth, ID band Patient awake    Reviewed: Allergy & Precautions, H&P , NPO status , Patient's Chart, lab work & pertinent test results, reviewed documented beta blocker date and time   Airway Mallampati: III  TM Distance: >3 FB Neck ROM: Full    Dental no notable dental hx. (+) Edentulous Upper, Dental Advisory Given   Pulmonary sleep apnea    Pulmonary exam normal breath sounds clear to auscultation       Cardiovascular hypertension, Pt. on medications and Pt. on home beta blockers +CHF   Rhythm:Regular Rate:Normal     Neuro/Psych  Headaches  negative psych ROS   GI/Hepatic Neg liver ROS,GERD  Medicated,,  Endo/Other  diabetes, Insulin Dependent  Class 4 obesity  Renal/GU Renal InsufficiencyRenal disease  negative genitourinary   Musculoskeletal  (+) Arthritis , Osteoarthritis,    Abdominal   Peds  Hematology  (+) Blood dyscrasia, anemia   Anesthesia Other Findings   Reproductive/Obstetrics negative OB ROS                             Anesthesia Physical Anesthesia Plan  ASA: 3  Anesthesia Plan: General   Post-op Pain Management: Tylenol PO (pre-op)*   Induction: Intravenous  PONV Risk Score and Plan: 4 or greater and Ondansetron, Dexamethasone and Treatment may vary due to age or medical condition  Airway Management Planned: Oral ETT  Additional Equipment:   Intra-op Plan:   Post-operative Plan: Extubation in OR  Informed Consent: I have reviewed the patients History and Physical, chart, labs and discussed the procedure including the risks, benefits and alternatives for the proposed anesthesia with the patient or authorized representative who has indicated his/her understanding and acceptance.     Dental advisory given  Plan Discussed with: CRNA  Anesthesia Plan Comments:        Anesthesia Quick  Evaluation

## 2023-08-31 NOTE — Interval H&P Note (Signed)
History and Physical Interval Note:  08/31/2023 9:16 AM  Jeanne [name]  has presented today for surgery, with the diagnosis of Right distal femur fracture.  The various methods of treatment have been discussed with the patient and family. After consideration of risks, benefits and other options for treatment, the patient has consented to  Procedure(s): OPEN REDUCTION INTERNAL FIXATION (ORIF) DISTAL FEMUR FRACTURE (Right) as a surgical intervention.  The patient's history has been reviewed, patient examined, no change in status, stable for surgery.  I have reviewed the patient's chart and labs.  Questions were answered to the patient's satisfaction.     Caryn Bee P Blaike Newburn

## 2023-08-31 NOTE — Evaluation (Signed)
Physical Therapy Evaluation Patient Details Name: Jeanne [name] MRN: [id number] DOB: [birth year] Today's Date: 08/31/2023  History of Present Illness  87 y.o. female [facility]/30/23 w/ R hip pain after falling while attempting to get into the car w/ head strike. Pt w/ R periprosthetic femur fx s/p ORIF of R supracondylar distal femur fx 12/27. PMHx: HTN, CAD, hyperlipidemia, DMT2, GERD, HF, CKD 3B.   Clinical Impression  Pt lethargic upon arrival and would intermittently fall asleep throughout the session. History provided by sister who states pt was ModI prior to fall with quad cane for short distances and rollator in the community. Pt was able to follow single step instructions with increased time in today's session. Pt was able to activate R quad, however, was unable to actively ankle DF/PF. Pt rolled to the left x2 with totalA, however, was unable to hold position due to pain. Deferred further mobility 2/2 decreased LOC and pain levels. Pt presents to therapy session with decreased R>L LE strength, balance, and mobility. Pt would benefit from acute skilled PT to address functional impairments. Recommending post-acute rehab <3 hrs to prevent future falls and help pt return to PLOF. Acute PT to follow.          If plan is discharge home, recommend the following: A lot of help with walking and/or transfers;A lot of help with bathing/dressing/bathroom;Assistance with cooking/housework;Assist for transportation;Help with stairs or ramp for entrance   Can travel by private vehicle   No    Equipment Recommendations Other (comment) (TBD at next venue)     Functional Status Assessment Patient has had a recent decline in their functional status and demonstrates the ability to make significant improvements in function in a reasonable and predictable amount of time.     Precautions / Restrictions Precautions Precautions: Fall Restrictions Weight Bearing Restrictions Per Provider  Order: Yes RLE Weight Bearing Per Provider Order: Weight bearing as tolerated      Mobility  Bed Mobility Overal bed mobility: Needs Assistance Bed Mobility: Rolling Rolling: Total assist, Used rails    General bed mobility comments: totalA to roll onto left side, pt unable to hold roll due to pain    Transfers    General transfer comment: deferred due to decreased LOC       Balance Overall balance assessment: History of Falls (will continue to assess)          Pertinent Vitals/Pain Pain Assessment Pain Assessment: Faces Faces Pain Scale: Hurts whole lot Pain Location: R LE w/ movement Pain Descriptors / Indicators: Aching, Discomfort, Crying, Guarding Pain Intervention(s): Limited activity within patient's tolerance, Monitored during session, Repositioned    Home Living Family/patient expects to be discharged to:: Private residence Living Arrangements: Children Available Help at Discharge: Family;Available PRN/intermittently Type of Home: House Home Access: Level entry       Home Layout: One level Home Equipment: Grab bars - tub/shower;Cane - quad;Rollator (4 wheels)      Prior Function Prior Level of Function : Independent/Modified Independent;History of Falls (last six months)      Mobility Comments: per family, independent w/ quad cane for short distances and rollator for community. No falls prior to recent fall where pt lost balance while getting into the car ADLs Comments: independent     Extremity/Trunk Assessment   Upper Extremity Assessment Upper Extremity Assessment: Defer to OT evaluation    Lower Extremity Assessment Lower Extremity Assessment: Generalized weakness;RLE deficits/detail RLE Deficits / Details: bandaged from upper thigh to metatarsals,  minimal quad activation. Not able to actively DF/PF    Cervical / Trunk Assessment Cervical / Trunk Assessment: Normal  Communication   Communication Communication: Difficulty communicating  thoughts/reduced clarity of speech Cueing Techniques: Verbal cues;Tactile cues  Cognition Arousal: Obtunded Behavior During Therapy: Flat affect Overall Cognitive Status: Difficult to assess    General Comments: family member answered questions, pt oriented to name. Unable to answer questions at this time and moans with movement of R LE. Able to follow commands for LE activation and to roll        General Comments General comments (skin integrity, edema, etc.): VSS on 3L [place]    Exercises General Exercises - Lower Extremity Ankle Circles/Pumps: AROM, Both, Supine, PROM, 5 reps (PROM R ankle) Quad Sets: AROM, Right, 5 reps, Supine      PT Assessment Patient needs continued PT services  PT Problem List Decreased strength;Decreased range of motion;Decreased activity tolerance;Decreased balance;Decreased mobility       PT Treatment Interventions DME instruction;Gait training;Functional mobility training;Therapeutic activities;Therapeutic exercise;Balance training;Neuromuscular re-education;Patient/family education;Wheelchair mobility training    PT Goals (Current goals can be found in the Care Plan section)  Acute Rehab PT Goals Patient Stated Goal: unable to state goal PT Goal Formulation: Patient unable to participate in goal setting Time For Goal Achievement: 09/14/23 Potential to Achieve Goals: Fair    Frequency Min 1X/week        AM-PAC PT "6 Clicks" Mobility  Outcome Measure Help needed turning from your back to your side while in a flat bed without using bedrails?: Total Help needed moving from lying on your back to sitting on the side of a flat bed without using bedrails?: Total Help needed moving to and from a bed to a chair (including a wheelchair)?: Total Help needed standing up from a chair using your arms (e.g., wheelchair or bedside chair)?: Total Help needed to walk in hospital room?: Total Help needed climbing 3-5 steps with a railing? : Total 6 Click Score:  6    End of Session   Activity Tolerance: Patient limited by lethargy;Patient limited by pain Patient left: in bed;with call bell/phone within reach;with bed alarm set;with family/visitor present Nurse Communication: Mobility status PT Visit Diagnosis: History of falling (Z91.81);Muscle weakness (generalized) (M62.81);Unsteadiness on feet (R26.81)    Time: 2956-2130 PT Time Calculation (min) (ACUTE ONLY): 20 min   Charges:   PT Evaluation $PT Eval Low Complexity: 1 Low   PT General Charges $$ ACUTE PT VISIT: 1 Visit         Hilton Cork, PT, DPT Secure Chat Preferred  Rehab Office [phone]   Arturo Morton Brion Aliment 08/31/2023, 5:17 PM

## 2023-08-31 NOTE — Plan of Care (Signed)

## 2023-08-31 NOTE — H&P (View-Only) (Signed)
Orthopaedic Trauma Service (OTS) Consult   Patient ID: Jeanne [name] MRN: [id number] DOB/AGE: 85-Apr-1937 85 y.o.  Reason for Consult:Right periprosthetic distal femur fracture Referring Physician: Dr. Delmer Islam, MD Cyndia Skeeters  HPI: Jeanne [name] is an 87 y.o. female who is being seen in consultation at the request of Dr. Christell Constant for evaluation of right periprosthetic distal femur fracture.  Patient had a ground-level fall sustaining the above injury.  Due to the complexity of her injury Dr. Christell Constant felt that this would best be treated by an orthopedic traumatologist.  Patient was seen and evaluated in the preoperative holding area.  Her daughter is currently at bedside.  She states that she walks with a walker and a rollator and occasionally with a cane.  She has been having some pain in bilateral knees.  She had her knee replacement multiple years ago.  She denies any other injuries.  She lives at home with her family.  Past Medical History:  Diagnosis Date   Anemia    Arthritis    Carotid arterial disease (HCC)    COVID-19    Diabetes mellitus    Hyperlipidemia    Hypertension    Migraine    Obesity    Reflux    Sleep apnea     Past Surgical History:  Procedure Laterality Date   CARDIAC CATHETERIZATION N/A 10/12/2015   Procedure: Left Heart Cath and Coronary Angiography;  Surgeon: Lyn Records, MD;  Location: [facility] INVASIVE CV LAB;  Service: Cardiovascular;  Laterality: N/A;   CHOLECYSTECTOMY  50 yrs ago   JOINT REPLACEMENT Right 2005   knee   LEFT HEART CATH AND CORONARY ANGIOGRAPHY N/A 12/07/2022   Procedure: LEFT HEART CATH AND CORONARY ANGIOGRAPHY;  Surgeon: Elder Negus, MD;  Location: [facility] INVASIVE CV LAB;  Service: Cardiovascular;  Laterality: N/A;   WISDOM TOOTH EXTRACTION      Family History  Problem Relation Age of Onset   Heart attack Father    Heart attack Mother     Social History:  reports that she has never smoked. She has never used smokeless tobacco.  She reports that she does not drink alcohol and does not use drugs.  Allergies:  Allergies  Allergen Reactions   Amoxicillin Nausea Only    Has patient had a PCN reaction causing immediate rash, facial/tongue/throat swelling, SOB or lightheadedness with hypotension: "yes, i was dizzy" Has patient had a PCN reaction causing severe rash involving mucus membranes or skin necrosis: no Did a PCN reaction that required hospitalization : no, called the DR. Did PCN reaction occurring within the last 10 years: unk if all of the above answers are "NO", then may proceed with Cephalosporin use.    Canagliflozin Nausea Only and Other (See Comments)    Dizziness  Dizziness  Dizziness   Enalapril Maleate     Other reaction(s): angioedema   Liraglutide     Other reaction(s): Nausea and dizziness   Pioglitazone     Other reaction(s): macular edema   Tramadol Hcl     Other reaction(s): nausea, headache and dizziness    Medications:  No current facility-administered medications on file prior to encounter.   Current Outpatient Medications on File Prior to Encounter  Medication Sig Dispense Refill   allopurinol (ZYLOPRIM) 100 MG tablet Take 100 mg by mouth daily.     amLODipine (NORVASC) 5 MG tablet Take 1 tablet (5 mg total) by mouth daily. 90 tablet 3   aspirin EC (ASPIRIN LOW DOSE) 81  MG tablet Take 1 tablet (81 mg total) by mouth daily. 90 tablet 0   Cholecalciferol 50 MCG (2000 UT) TABS Take by mouth.     escitalopram (LEXAPRO) 10 MG tablet TAKE 2 TABLETS BY MOUTH EVERY DAY 180 tablet 2   esomeprazole (NEXIUM) 20 MG capsule Take 1 capsule (20 mg total) by mouth daily at 12 noon. 90 capsule 3   ezetimibe (ZETIA) 10 MG tablet Take 10 mg by mouth daily.     ferrous sulfate 325 (65 FE) MG tablet Take 325 mg by mouth at bedtime.     furosemide (LASIX) 20 MG tablet TAKE 1 TABLET BY MOUTH EVERY DAY 90 tablet 3   hydrALAZINE (APRESOLINE) 25 MG tablet Take 1 tablet (25 mg total) by mouth 3 (three)  times daily. 270 tablet 3   insulin glargine (LANTUS) 100 UNIT/ML injection Inject 65 Units into the skin daily before breakfast.     latanoprost (XALATAN) 0.005 % ophthalmic solution INSTILL 1 DROP IN LEFT EYE NIGHTLY 7.5 mL 12   lidocaine (HM LIDOCAINE PATCH) 4 % Place 1 patch onto the skin.     losartan (COZAAR) 25 MG tablet Take 1 tablet (25 mg total) by mouth 2 (two) times daily. 180 tablet 1   meclizine (ANTIVERT) 12.5 MG tablet Take 12.5 mg by mouth 3 (three) times daily as needed for dizziness.     metoprolol succinate (TOPROL-XL) 50 MG 24 hr tablet Take 1 tablet (50 mg total) by mouth daily. Take with or immediately following a meal. 90 tablet 3   nitroGLYCERIN (NITROSTAT) 0.4 MG SL tablet PLACE 1 TABLET UNDER THE TONGUE AS NEEDED FOR CHEST PAIN. 25 tablet 3   oxybutynin (DITROPAN-XL) 10 MG 24 hr tablet Take 10 mg by mouth daily.     Polyvinyl Alcohol-Povidone (REFRESH OP) Place 1 drop into both eyes at bedtime.     pravastatin (PRAVACHOL) 40 MG tablet Take 1 tablet (40 mg total) by mouth daily. 90 tablet 2   isosorbide mononitrate (IMDUR) 60 MG 24 hr tablet Take 1 tablet (60 mg total) by mouth daily. (Patient taking differently: Take 30 mg by mouth daily.) 30 tablet 0     ROS: Constitutional: No fever or chills Vision: No changes in vision ENT: No difficulty swallowing CV: No chest pain Pulm: No SOB or wheezing GI: No nausea or vomiting GU: No urgency or inability to hold urine Skin: No poor wound healing Neurologic: No numbness or tingling Psychiatric: No depression or anxiety Heme: No bruising Allergic: No reaction to medications or food   Exam: Blood pressure (!) 160/78, pulse 78, temperature 98.4 F (36.9 C), temperature source Oral, resp. rate 17, height 5\' 3"  (1.6 m), weight (!) 139 kg, SpO2 94%. General: No acute distress Orientation: Awake alert and oriented x 3 Mood and Affect: Cooperative and pleasant Gait: Unable to assess due to her fracture Coordination and  balance: Within normal limits   Right lower extremity: Knee immobilizer in some place.  The femur is deformity through the leg.  Compartments are soft compressible.  Motor and sensory function is intact to the right lower extremity.  She has a warm well-perfused foot with brisk cap refill less than 2 seconds.  Left lower extremity: Skin without lesions. No tenderness to palpation. Full painless ROM, full strength in each muscle groups without evidence of instability.   Medical Decision Making: Data: Imaging: X-rays and CT scan are reviewed which shows a periprosthetic distal femur fracture with significant comminution and displacement.  Labs:  Results for orders placed or performed during the hospital encounter of 08/30/23 (from the past 24 hours)  Basic metabolic panel     Status: Abnormal   Collection Time: 08/30/23  5:19 PM  Result Value Ref Range   Sodium 135 135 - 145 mmol/L   Potassium 4.3 3.5 - 5.1 mmol/L   Chloride 97 (L) 98 - 111 mmol/L   CO2 27 22 - 32 mmol/L   Glucose, Bld 130 (H) 70 - 99 mg/dL   BUN 19 8 - 23 mg/dL   Creatinine, Ser 9.60 (H) 0.44 - 1.00 mg/dL   Calcium 9.1 8.9 - 45.4 mg/dL   GFR, Estimated 34 (L) >60 mL/min   Anion gap 11 5 - 15  CBC with Differential     Status: Abnormal   Collection Time: 08/30/23  5:19 PM  Result Value Ref Range   WBC 6.3 4.0 - 10.5 K/uL   RBC 4.39 3.87 - 5.11 MIL/uL   Hemoglobin 11.6 (L) 12.0 - 15.0 g/dL   HCT 09.8 11.9 - 14.7 %   MCV 86.8 80.0 - 100.0 fL   MCH 26.4 26.0 - 34.0 pg   MCHC 30.4 30.0 - 36.0 g/dL   RDW 82.9 56.2 - 13.0 %   Platelets 208 150 - 400 K/uL   nRBC 0.0 0.0 - 0.2 %   Neutrophils Relative % 63 %   Neutro Abs 4.0 1.7 - 7.7 K/uL   Lymphocytes Relative 22 %   Lymphs Abs 1.4 0.7 - 4.0 K/uL   Monocytes Relative 10 %   Monocytes Absolute 0.6 0.1 - 1.0 K/uL   Eosinophils Relative 3 %   Eosinophils Absolute 0.2 0.0 - 0.5 K/uL   Basophils Relative 1 %   Basophils Absolute 0.0 0.0 - 0.1 K/uL   Immature  Granulocytes 1 %   Abs Immature Granulocytes 0.04 0.00 - 0.07 K/uL  ABO/Rh     Status: None   Collection Time: 08/30/23  5:19 PM  Result Value Ref Range   ABO/RH(D)      B POS Performed at [facility], [address] [place], [place] [address]   Type and screen [facility]     Status: None   Collection Time: 08/30/23 11:55 PM  Result Value Ref Range   ABO/RH(D) B POS    Antibody Screen NEG    Sample Expiration      09/02/2023,2359 Performed at [facility], [address] [place], [place] [address]   CBG monitoring, ED     Status: Abnormal   Collection Time: 08/31/23  2:57 AM  Result Value Ref Range   Glucose-Capillary 204 (H) 70 - 99 mg/dL  Surgical PCR screen     Status: Abnormal   Collection Time: 08/31/23  5:19 AM   Specimen: Nasal Mucosa; Nasal Swab  Result Value Ref Range   MRSA, PCR NEGATIVE NEGATIVE   Staphylococcus aureus POSITIVE (A) NEGATIVE  CBC     Status: Abnormal   Collection Time: 08/31/23  6:01 AM  Result Value Ref Range   WBC 8.2 4.0 - 10.5 K/uL   RBC 4.14 3.87 - 5.11 MIL/uL   Hemoglobin 11.1 (L) 12.0 - 15.0 g/dL   HCT 95.2 (L) 84.1 - 32.4 %   MCV 84.5 80.0 - 100.0 fL   MCH 26.8 26.0 - 34.0 pg   MCHC 31.7 30.0 - 36.0 g/dL   RDW 40.1 02.7 - 25.3 %   Platelets 191 150 - 400 K/uL  nRBC 0.0 0.0 - 0.2 %  Basic metabolic panel     Status: Abnormal   Collection Time: 08/31/23  6:01 AM  Result Value Ref Range   Sodium 134 (L) 135 - 145 mmol/L   Potassium 4.5 3.5 - 5.1 mmol/L   Chloride 95 (L) 98 - 111 mmol/L   CO2 29 22 - 32 mmol/L   Glucose, Bld 213 (H) 70 - 99 mg/dL   BUN 18 8 - 23 mg/dL   Creatinine, Ser 2.95 (H) 0.44 - 1.00 mg/dL   Calcium 9.0 8.9 - 28.4 mg/dL   GFR, Estimated 29 (L) >60 mL/min   Anion gap 10 5 - 15  Glucose, capillary     Status: Abnormal   Collection Time: 08/31/23  8:14 AM  Result Value Ref Range   Glucose-Capillary 187 (H) 70 - 99 mg/dL     Imaging or Labs ordered: None  Medical history  and chart was reviewed and case discussed with medical provider.  Assessment/Plan: 87 year old female status post ground-level fall with a right periprosthetic distal femur fracture.  Due to the unstable nature of her injury I recommend proceeding with open reduction internal fixation.  Risks and benefits were discussed with the patient.  Risks included but not limited to bleeding, infection, malunion, nonunion, hardware failure, hardware rotation, nerve or blood vessel injury, DVT, even the possibility anesthetic complications.  She agreed to proceed with surgery and consent was obtained.  Routine postoperative course was discussed with the patient and her daughter.  Including weightbearing status as well as postoperative recovery.  All questions were answered to their satisfaction.  Roby Lofts, MD [facility] [phone] (office) [email or web address]

## 2023-08-31 NOTE — Op Note (Signed)
Orthopaedic Surgery Operative Note (CSN: [id number] ) Date of Surgery: 08/31/2023  Admit Date: 08/30/2023   Diagnoses: Pre-Op Diagnoses: Right periprosthetic distal femur fracture  Post-Op Diagnosis: Same  Procedures: CPT 27511-Open reduction internal fixation of right supracondylar distal femur fracture  Surgeons : Primary: Roby Lofts, MD  Assistant: Ulyses Southward, PA-C  Location: OR 3   Anesthesia: General   Antibiotics: Ancef 3g preop with 1 gm vancomycin powder placed topically   Tourniquet time:None    Estimated Blood Loss: 300 mL  Complications: None  Specimens: None   Implants: Implant Name Type Inv. Item Serial No. Manufacturer Lot No. LRB No. Used Action  CAP LOCK NCB - [id number] Cap CAP LOCK NCB  [employer](ORTH,TRAU,BIO,SG)  Right 7 Implanted  PLATE FEM DIST NCB PP - [id number] Plate PLATE FEM DIST NCB PP  [employer](ORTH,TRAU,BIO,SG)  Right 1 Implanted  SCREW NCB 5.0X85MM - [id number] Screw SCREW NCB 5.0X85MM  [employer](ORTH,TRAU,BIO,SG)  Right 3 Implanted  SCREW 5.0 - [id number] Screw SCREW 5.0  [employer](ORTH,TRAU,BIO,SG)  Right 3 Implanted  SCREW CORTICAL NCB 5.0X40 - [id number] Screw SCREW CORTICAL NCB 5.0X40  [employer](ORTH,TRAU,BIO,SG)  Right 1 Implanted  SCREW NCB 5.0X38 - [id number] Screw SCREW NCB 5.0X38  [employer](ORTH,TRAU,BIO,SG)  Right 1 Implanted  SCREW NCB 4.0MX65M - [id number] Screw SCREW NCB 4.0MX65M  [employer](ORTH,TRAU,BIO,SG)  Right 1 Implanted  SCREW NCB 4.0MX38M - [id number] Screw SCREW NCB 4.0MX38M  [employer](ORTH,TRAU,BIO,SG)  Right 1 Implanted     Indications for Surgery: 87 year old female who sustained a ground-level fall with a right periprosthetic supracondylar distal femur fracture.  Due to the unstable nature of her injury I recommended proceeding with open reduction internal fixation.  Risks and benefits were discussed with the patient and her daughter.  Risks included but not  limited to bleeding, infection, malunion, nonunion, hardware failure, hardware rotation, nerve and blood vessel injury, knee stiffness, DVT, even the possibility anesthetic complications.  They agreed to proceed with surgery and consent was obtained.  Operative Findings: Open reduction internal fixation of right supracondylar distal femur fracture using [employer] NCB distal femoral locking plate  Procedure: The patient was identified in the preoperative holding area. Consent was confirmed with the patient and their family and all questions were answered. The operative extremity was marked after confirmation with the patient. she was then brought back to the operating room by our anesthesia colleagues.  She was placed under general anesthetic and carefully transferred over to radiolucent flattop table.  A bump was placed under her operative hip.  The right lower extremity was then prepped and draped in usual sterile fashion.  Timeout was performed to verify the patient, the procedure, and the extremity.  Preoperative antibiotics were dosed.  The hip and knee were flexed over a triangle.  Traction was applied by my assistant and alignment was maintained.  A lateral approach to the distal femur was carried down through skin and subcutaneous tissue.  I incised through the IT band to expose the lateral condyle of the femur.  I then developed a path between the vastus lateralis and the lateral cortex of the femur.  I slid a 12 hole [employer] NCB distal femoral locking plate attached to a targeting arm along the lateral cortex of the femur.  I held it provisionally distally with a 2.0 mm K wire.  I placed a 3.3 mm drill bit bicortically in the proximal hole of the plate to align the proximal portion of the  plate.  I then drilled and placed 5.0 millimeter screws distally to align the distal portion of the plate and bring it flush to bone.  I then percutaneously placed 5.0 millimeter screws in the femoral  shaft to align the fracture.  Placed locking caps on the 5.0 millimeter screws and exchanged the 3.3 mm drill bit for 4.0 millimeter screw proximally.  This was left unlocked.  A 4.0 millimeter screw was placed in the metaphysis to bring the plate and metaphyseal closer to further dial in the coronal reduction.  I then returned to the distal segment and placed a total of five 5.0 millimeter screws.  Locking caps were placed on all of the distal screws.  Final fluoroscopic imaging was obtained.  The incision was copiously irrigated.  A gram of vancomycin powder was placed into the incision.  A layered closure of 0 Vicryl, 2-0 Monocryl and 3-0 Monocryl with Dermabond was used to close the skin.  Sterile dressings were applied.  The patient was then awoke from anesthesia and taken to the PACU in stable condition.  Post Op Plan/Instructions: The patient be weightbearing as tolerated to the right lower extremity.  She will receive postoperative Ancef.  She will receive Lovenox for DVT prophylaxis and discharged on oral DOAC.  Will have her mobilize physical and Occupational Therapy.  I was present and performed the entire surgery.  Ulyses Southward, PA-C did assist me throughout the case. An assistant was necessary given the difficulty in approach, maintenance of reduction and ability to instrument the fracture.   Truitt Merle, MD [facility]

## 2023-08-31 NOTE — ED Notes (Signed)
ED TO INPATIENT HANDOFF REPORT  ED Nurse Name and Phone #:      Ivonne Fard Borunda RN [phone]  S Name/Age/Gender Samul Dada 87 y.o. female Room/Bed: 007C/007C  Code Status   Code Status: Prior  Home/SNF/Other Home Patient oriented to: A&O x4 Is this baseline? Yes   Triage Complete: Triage complete  Chief Complaint Hip fracture [facility]) [S72.009A]  Triage Note Pt to the ed from home with a CC of trip and fall while getting into car. Pt relay relays sever right knee pain and back of the head pain. Pt felt right knee pop when she fell. Pt is not on blood thinners. Pt did not have LOC.    Allergies Allergies  Allergen Reactions   Amoxicillin Nausea Only    Has patient had a PCN reaction causing immediate rash, facial/tongue/throat swelling, SOB or lightheadedness with hypotension: "yes, i was dizzy" Has patient had a PCN reaction causing severe rash involving mucus membranes or skin necrosis: no Did a PCN reaction that required hospitalization : no, called the DR. Did PCN reaction occurring within the last 10 years: unk if all of the above answers are "NO", then may proceed with Cephalosporin use.    Enalapril Maleate     Other reaction(s): angioedema   Invokana [Canagliflozin] Nausea Only and Other (See Comments)    Dizziness    Liraglutide     Other reaction(s): Nausea and dizziness   Pioglitazone     Other reaction(s): macular edema   Tramadol Hcl     Other reaction(s): nausea, headache and dizziness    Level of Care/Admitting Diagnosis ED Disposition     ED Disposition  Admit   Condition  --   Comment  Hospital Area: [facility] [[id number]]  Level of Care: Telemetry Medical [104]  May admit patient to [facility] or [facility] if equivalent level of care is available:: No  Covid Evaluation: Asymptomatic - no recent exposure (last 10 days) testing not required  Diagnosis: Hip fracture [facility]) [[id number]]  Admitting Physician: Lurline Del  [[id number]]  Attending Physician: Lurline Del [[id number]]  Certification:: I certify this patient will need inpatient services for at least 2 midnights  Expected Medical Readiness: 09/03/2023          B Medical/Surgery History Past Medical History:  Diagnosis Date   Anemia    Arthritis    Carotid arterial disease (HCC)    COVID-19    Diabetes mellitus    Hyperlipidemia    Hypertension    Migraine    Obesity    Reflux    Sleep apnea    Past Surgical History:  Procedure Laterality Date   CARDIAC CATHETERIZATION N/A 10/12/2015   Procedure: Left Heart Cath and Coronary Angiography;  Surgeon: Lyn Records, MD;  Location: [facility] INVASIVE CV LAB;  Service: Cardiovascular;  Laterality: N/A;   CHOLECYSTECTOMY  50 yrs ago   JOINT REPLACEMENT Right 2005   knee   LEFT HEART CATH AND CORONARY ANGIOGRAPHY N/A 12/07/2022   Procedure: LEFT HEART CATH AND CORONARY ANGIOGRAPHY;  Surgeon: Elder Negus, MD;  Location: [facility] INVASIVE CV LAB;  Service: Cardiovascular;  Laterality: N/A;   WISDOM TOOTH EXTRACTION       A IV Location/Drains/Wounds Patient Lines/Drains/Airways Status     Active Line/Drains/Airways     Name Placement date Placement time Site Days   Peripheral IV 08/30/23 20 G 1.88" Right Antecubital 08/30/23  1948  Antecubital  1  Intake/Output Last 24 hours No intake or output data in the 24 hours ending 08/31/23 0210  Labs/Imaging Results for orders placed or performed during the hospital encounter of 08/30/23 (from the past 48 hours)  Basic metabolic panel     Status: Abnormal   Collection Time: 08/30/23  5:19 PM  Result Value Ref Range   Sodium 135 135 - 145 mmol/L   Potassium 4.3 3.5 - 5.1 mmol/L   Chloride 97 (L) 98 - 111 mmol/L   CO2 27 22 - 32 mmol/L   Glucose, Bld 130 (H) 70 - 99 mg/dL    Comment: Glucose reference range applies only to samples taken after fasting for at least 8 hours.   BUN 19 8 - 23 mg/dL   Creatinine, Ser 1.61 (H) 0.44  - 1.00 mg/dL   Calcium 9.1 8.9 - 09.6 mg/dL   GFR, Estimated 34 (L) >60 mL/min    Comment: (NOTE) Calculated using the CKD-EPI Creatinine Equation (2021)    Anion gap 11 5 - 15    Comment: Performed at [facility], [address] [place], [place] [address]  CBC with Differential     Status: Abnormal   Collection Time: 08/30/23  5:19 PM  Result Value Ref Range   WBC 6.3 4.0 - 10.5 K/uL   RBC 4.39 3.87 - 5.11 MIL/uL   Hemoglobin 11.6 (L) 12.0 - 15.0 g/dL   HCT 98.1 19.1 - 47.8 %   MCV 86.8 80.0 - 100.0 fL   MCH 26.4 26.0 - 34.0 pg   MCHC 30.4 30.0 - 36.0 g/dL   RDW 29.5 62.1 - 30.8 %   Platelets 208 150 - 400 K/uL   nRBC 0.0 0.0 - 0.2 %   Neutrophils Relative % 63 %   Neutro Abs 4.0 1.7 - 7.7 K/uL   Lymphocytes Relative 22 %   Lymphs Abs 1.4 0.7 - 4.0 K/uL   Monocytes Relative 10 %   Monocytes Absolute 0.6 0.1 - 1.0 K/uL   Eosinophils Relative 3 %   Eosinophils Absolute 0.2 0.0 - 0.5 K/uL   Basophils Relative 1 %   Basophils Absolute 0.0 0.0 - 0.1 K/uL   Immature Granulocytes 1 %   Abs Immature Granulocytes 0.04 0.00 - 0.07 K/uL    Comment: Performed at [facility], [address], [place], [place] [address]  ABO/Rh     Status: None   Collection Time: 08/30/23  5:19 PM  Result Value Ref Range   ABO/RH(D)      B POS Performed at [facility], [address], [place], [place] [address]   Type and screen [facility]     Status: None   Collection Time: 08/30/23 11:55 PM  Result Value Ref Range   ABO/RH(D) B POS    Antibody Screen NEG    Sample Expiration      09/02/2023,2359 Performed at [facility], [address], [place], [place] [address]    CT KNEE RIGHT WO CONTRAST Result Date: 08/31/2023 CLINICAL DATA:  Insert Street EXAM: CT OF THE RIGHT KNEE WITHOUT CONTRAST TECHNIQUE: Multidetector CT imaging of the right knee was performed according to the standard protocol. Multiplanar CT image reconstructions were also generated.  RADIATION DOSE REDUCTION: This exam was performed according to the departmental dose-optimization program which includes automated exposure control, adjustment of the mA and/or kV according to patient size and/or use of iterative reconstruction technique. COMPARISON:  X-ray right knee 08/30/2023 FINDINGS:  Bones/Joint/Cartilage Total knee arthroplasty. Acute displaced comminuted distal femoral shaft fracture. Joint effusion. Ligaments Suboptimally assessed by CT. Muscles and Tendons: Grossly unremarkable. Soft tissues Popliteal fossa subcutaneus soft tissue edema with limited evaluation due to streak artifact. IMPRESSION: Acute displaced and comminuted distal femoral shaft fracture just proximal to the total knee arthroplasty. Electronically Signed   By: Tish Frederickson M.D.   On: 08/31/2023 01:42   DG Femur 1V Right Result Date: 08/30/2023 CLINICAL DATA:  Status post fall. EXAM: RIGHT FEMUR 1 VIEW COMPARISON:  None Available. FINDINGS: There is acute fracture deformity involving the distal right femoral shaft. This extends to within 3 mm of an otherwise intact right knee replacement. There is no evidence of dislocation. Soft tissue swelling is seen surrounding the previously noted fracture site. IMPRESSION: Acute fracture of the distal right femoral shaft. Electronically Signed   By: Aram Candela M.D.   On: 08/30/2023 23:07   DG Knee 2 Views Right Result Date: 08/30/2023 CLINICAL DATA:  Status post fall. EXAM: RIGHT KNEE - 1-2 VIEW COMPARISON:  None Available. FINDINGS: An intact right knee replacement is seen. There is an acute fracture deformity involving the distal right femoral shaft. The distal fracture site is approximately 3 mm from the right femoral prosthesis of the previously noted knee replacement. There is no evidence of dislocation. A large suprapatellar effusion is noted. IMPRESSION: 1. Acute fracture of the distal right femoral shaft. 2. Intact right knee replacement. 3. Large  suprapatellar effusion. Electronically Signed   By: Aram Candela M.D.   On: 08/30/2023 23:05   CT ANGIO LOWER EXT BILAT W &/OR WO CONTRAST Result Date: 08/30/2023 CLINICAL DATA:  Recent fall with right knee pain EXAM: CT ANGIOGRAPHY OF ABDOMINAL AORTA WITH ILIOFEMORAL RUNOFF TECHNIQUE: Multidetector CT imaging of the abdomen, pelvis and lower extremities was performed using the standard protocol during bolus administration of intravenous contrast. Multiplanar CT image reconstructions and MIPs were obtained to evaluate the vascular anatomy. RADIATION DOSE REDUCTION: This exam was performed according to the departmental dose-optimization program which includes automated exposure control, adjustment of the mA and/or kV according to patient size and/or use of iterative reconstruction technique. CONTRAST:  80mL OMNIPAQUE IOHEXOL 350 MG/ML SOLN COMPARISON:  None Available. FINDINGS: VASCULAR Aorta: Abdominal aorta demonstrates atherosclerotic calcifications without aneurysmal dilatation or dissection. Celiac: Patent without evidence of aneurysm, dissection, vasculitis or significant stenosis. SMA: Patent without evidence of aneurysm, dissection, vasculitis or significant stenosis. Renals: Mild atherosclerotic changes are noted. Mild stenosis is noted proximally on the left. IMA: Patent without evidence of aneurysm, dissection, vasculitis or significant stenosis. RIGHT Lower Extremity Inflow: Atherosclerotic calcifications are noted in the iliac artery. No focal stenosis is noted. Runoff: Common femoral and femoral bifurcation on the right are widely patent. The superficial femoral artery is also widely patent as is the popliteal artery. Popliteal trifurcation is patent with three-vessel runoff to the level of the right ankle. Anterior and posterior tibial arteries continue into the foot. LEFT Lower Extremity Inflow: Atherosclerotic calcifications of the iliac vessels are seen without focal stenosis. Runoff:  Common femoral artery and femoral bifurcation are patent. Superficial femoral artery and popliteal artery are widely patent as well. Popliteal trifurcation is patent with three-vessel runoff to the left ankle. Veins: No specific venous abnormality is noted. Review of the MIP images confirms the above findings. NON-VASCULAR Lower chest: Lung bases are free of acute infiltrate or sizable effusion. Hepatobiliary: No focal liver abnormality is seen. Status post cholecystectomy. No biliary dilatation. Pancreas: Unremarkable. No pancreatic  ductal dilatation or surrounding inflammatory changes. Spleen: Normal in size without focal abnormality. Adrenals/Urinary Tract: Adrenal glands are within normal limits. Kidneys demonstrate multiple renal cysts bilaterally some of which are hyperdense in nature. These appear stable in appearance from a prior MRI from 2021. Given their long-term stability no further follow-up is recommended. No obstructive changes are seen. The bladder is well distended. Stomach/Bowel: Scattered fecal material is noted throughout the colon. A knuckle of transverse colon extends into an umbilical fat containing hernia without complicating factors. The appendix is within normal limits. Small bowel and stomach are unremarkable. Lymphatic: No enlarged abdominal or pelvic lymph nodes. Reproductive: Calcified uterine fibroids are noted. No adnexal mass is noted. Other: No free fluid is seen. Umbilical fat containing hernia is noted as previously described. Musculoskeletal: Degenerative changes of lumbar spine are noted. Right knee prosthesis is seen. Just above the knee prosthesis there is a comminuted fracture of the distal femoral metaphysis without significant displacement. No other acute bony abnormality is noted. No patellar dislocation is noted. IMPRESSION: VASCULAR Scattered atherosclerotic calcifications No significant vascular abnormality is noted to correspond with the given clinical history.  NON-VASCULAR Comminuted distal right femoral fracture involving the metaphysis just above the knee prosthesis. Multiple renal cysts are noted bilaterally relatively stable from a prior MRI in 2021. No further follow-up is recommended. Fat containing umbilical hernia with a knuckle of transverse colon within. No obstructive changes are noted. Uterine fibroid change. Electronically Signed   By: Alcide Clever M.D.   On: 08/30/2023 22:10   CT Cervical Spine Wo Contrast Result Date: 08/30/2023 CLINICAL DATA:  Status post fall. EXAM: CT CERVICAL SPINE WITHOUT CONTRAST TECHNIQUE: Multidetector CT imaging of the cervical spine was performed without intravenous contrast. Multiplanar CT image reconstructions were also generated. RADIATION DOSE REDUCTION: This exam was performed according to the departmental dose-optimization program which includes automated exposure control, adjustment of the mA and/or kV according to patient size and/or use of iterative reconstruction technique. COMPARISON:  December 12, 2020 FINDINGS: Alignment: Normal. Skull base and vertebrae: No acute fracture. No primary bone lesion or focal pathologic process. Soft tissues and spinal canal: No prevertebral fluid or swelling. No visible canal hematoma. Disc levels: Mild endplate sclerosis, moderate severity anterior osteophyte formation and moderate severity posterior bony spurring are seen at the levels of C3-C4, C4-C5, C5-C6 and C6-C7. Moderate severity intervertebral disc space narrowing is seen at C5-C6 and C6-C7, with mild intervertebral disc space narrowing present throughout the remainder of the cervical spine. Bilateral marked severity multilevel facet joint hypertrophy is noted. Upper chest: Negative. Other: None. IMPRESSION: 1. No acute fracture or subluxation in the cervical spine. 2. Moderate severity multilevel degenerative changes, as described above. Electronically Signed   By: Aram Candela M.D.   On: 08/30/2023 19:53   CT Head Wo  Contrast Result Date: 08/30/2023 CLINICAL DATA:  Status post fall. EXAM: CT HEAD WITHOUT CONTRAST TECHNIQUE: Contiguous axial images were obtained from the base of the skull through the vertex without intravenous contrast. RADIATION DOSE REDUCTION: This exam was performed according to the departmental dose-optimization program which includes automated exposure control, adjustment of the mA and/or kV according to patient size and/or use of iterative reconstruction technique. COMPARISON:  March 15, 2023 FINDINGS: Brain: There is mild cerebral atrophy with widening of the extra-axial spaces and ventricular dilatation. There are areas of decreased attenuation within the white matter tracts of the supratentorial brain, consistent with microvascular disease changes. Vascular: Marked severity bilateral cavernous carotid artery calcification is noted.  Skull: Normal. Negative for fracture or focal lesion. Sinuses/Orbits: No acute finding. Other: It should be noted that the study is markedly limited secondary to patient motion. IMPRESSION: 1. Markedly limited study secondary to patient motion. 2. Generalized cerebral atrophy with chronic white matter small vessel ischemic changes. 3. No acute intracranial abnormality. Electronically Signed   By: Aram Candela M.D.   On: 08/30/2023 19:50    Pending Labs Unresulted Labs (From admission, onward)    None       Vitals/Pain Today's Vitals   08/30/23 2045 08/31/23 0000 08/31/23 0010 08/31/23 0100  BP: (!) 151/87 (!) 170/100  (!) 157/80  Pulse: 74 84  79  Resp: 18 18  18   Temp:  98 F (36.7 C)    TempSrc:  Oral    SpO2: 97% 97%  96%  Weight:      Height:      PainSc:   8      Isolation Precautions No active isolations  Medications Medications  oxyCODONE-acetaminophen (PERCOCET/ROXICET) 5-325 MG per tablet 1 tablet (1 tablet Oral Given 08/30/23 1718)  iohexol (OMNIPAQUE) 350 MG/ML injection 80 mL (80 mLs Intravenous Contrast Given 08/30/23 2155)   oxyCODONE (Oxy IR/ROXICODONE) immediate release tablet 5 mg (5 mg Oral Given 08/30/23 2236)  ondansetron (ZOFRAN) injection 4 mg (4 mg Intravenous Given 08/30/23 2236)  tranexamic acid (CYKLOKAPRON) IVPB 1,000 mg (0 mg Intravenous Stopped 08/31/23 0009)  clindamycin (CLEOCIN) IVPB 900 mg (0 mg Intravenous Stopped 08/31/23 0105)  fentaNYL (SUBLIMAZE) injection 50 mcg (50 mcg Intravenous Given 08/31/23 0124)    Mobility walks with device     Focused Assessments     R Recommendations: See Admitting Provider Note  Report given to:   Additional Notes:

## 2023-08-31 NOTE — H&P (Addendum)
History and Physical    Jeanne [name]:[id number] DOB: 06-15-1936 DOA: 08/30/2023  PCP: Renford Dills, MD  Patient coming from: home  I have personally briefly reviewed patient's old medical records in [facility]  Chief Complaint:  right hip pain  s/p mechanical fall   HPI: Jeanne [name] is a 87 y.o. female with medical history significant of  CAD, hypertension, hyperlipidemia, type 2 diabetes mellitus, GERD, chronic diastolic heart failure, CKD 3B associated baseline creatinine range 1.5-2.0,  obstructive sleep apnea on home nocturnal CPAP, who presents to ED s/p mechanical fall while attempting to get into car.  Patient s/p fall has severe right knee pain and had also had head strike during the fall and complained of pain at the back of her head.  Patient on ros, noted no current chest pain , sob, fever/chills/ n/v/or dysuria, she does however note GERD symptoms of bitter state in her mouth. She does endorse history of chest pain for which she states nitroglycerin, however notes that these episode are rare. She also states she is able to do her activities of daily living without sob or chest pain .  ED Course:  Afeb, BP 185/93, HR 80, rr 16 sat 98%   Patient was evaluated and found to have a right periprosthetic hip fracture.  Orthopedics was consulted who noted plan for surgical repair in the am.  Labs wbc 6.3, plt 208,  hgb 11.6 ( at baseline) BMP Na 135, K 4.3, CL 97, glu 130, cr 1.49 ( at baseline)  CTH  IMPRESSION: 1. Markedly limited study secondary to patient motion. 2. Generalized cerebral atrophy with chronic white matter small vessel ischemic changes. 3. No acute intracranial abnormality. CT cervical spine IMPRESSION: 1. No acute fracture or subluxation in the cervical spine. 2. Moderate severity multilevel degenerative changes, as described above.  Xray  IMPRESSION: 1. Acute fracture of the distal right femoral shaft. 2. Intact right knee  replacement. 3. Large suprapatellar effusion.   CT right knee IMPRESSION: Acute displaced and comminuted distal femoral shaft fracture just proximal to the total knee arthroplasty. Tx: oxycodone, zofran, tranexamic acid clindamycin, fentanyl  Review of Systems: As per HPI otherwise 10 point review of systems negative.   Past Medical History:  Diagnosis Date   Anemia    Arthritis    Carotid arterial disease (HCC)    COVID-19    Diabetes mellitus    Hyperlipidemia    Hypertension    Migraine    Obesity    Reflux    Sleep apnea     Past Surgical History:  Procedure Laterality Date   CARDIAC CATHETERIZATION N/A 10/12/2015   Procedure: Left Heart Cath and Coronary Angiography;  Surgeon: Lyn Records, MD;  [facility]: [facility] INVASIVE CV LAB;  Service: Cardiovascular;  Laterality: N/A;   CHOLECYSTECTOMY  50 yrs ago   JOINT REPLACEMENT Right 2005   knee   LEFT HEART CATH AND CORONARY ANGIOGRAPHY N/A 12/07/2022   Procedure: LEFT HEART CATH AND CORONARY ANGIOGRAPHY;  Surgeon: Elder Negus, MD;  Location: [facility] INVASIVE CV LAB;  Service: Cardiovascular;  Laterality: N/A;   WISDOM TOOTH EXTRACTION       reports that she has never smoked. She has never used smokeless tobacco. She reports that she does not drink alcohol and does not use drugs.  Allergies  Allergen Reactions   Amoxicillin Nausea Only    Has patient had a PCN reaction causing immediate rash, facial/tongue/throat swelling, SOB or lightheadedness with hypotension: "yes,  i was dizzy" Has patient had a PCN reaction causing severe rash involving mucus membranes or skin necrosis: no Did a PCN reaction that required hospitalization : no, called the DR. Did PCN reaction occurring within the last 10 years: unk if all of the above answers are "NO", then may proceed with Cephalosporin use.    Enalapril Maleate     Other reaction(s): angioedema   Invokana [Canagliflozin] Nausea Only and Other (See Comments)    Dizziness     Liraglutide     Other reaction(s): Nausea and dizziness   Pioglitazone     Other reaction(s): macular edema   Tramadol Hcl     Other reaction(s): nausea, headache and dizziness    Family History  Problem Relation Age of Onset   Heart attack Father    Heart attack Mother     Prior to Admission medications   Medication Sig Start Date End Date Taking? Authorizing Provider  insulin glargine (LANTUS) 100 UNIT/ML injection Inject 65 Units into the skin daily before breakfast.   Yes [provider]  nitroGLYCERIN (NITROSTAT) 0.4 MG SL tablet PLACE 1 TABLET UNDER THE TONGUE AS NEEDED FOR CHEST PAIN. Patient taking differently: Place 0.4 mg under the tongue every 5 (five) minutes as needed for chest pain. 08/21/22  Yes Patwardhan, Manish J, MD  allopurinol (ZYLOPRIM) 100 MG tablet Take 100 mg by mouth daily. 05/27/20   [provider]  amLODipine (NORVASC) 5 MG tablet Take 1 tablet (5 mg total) by mouth daily. 03/05/23   Patwardhan, Anabel Bene, MD  aspirin EC (ASPIRIN LOW DOSE) 81 MG tablet Take 1 tablet (81 mg total) by mouth daily. 02/13/23   Patwardhan, Anabel Bene, MD  Cholecalciferol 50 MCG (2000 UT) TABS Take by mouth. 07/30/19   [provider]  escitalopram (LEXAPRO) 10 MG tablet TAKE 2 TABLETS BY MOUTH EVERY DAY 06/18/23   Windell Norfolk, MD  esomeprazole (NEXIUM) 20 MG capsule Take 1 capsule (20 mg total) by mouth daily at 12 noon. 03/05/23   Patwardhan, Anabel Bene, MD  ezetimibe (ZETIA) 10 MG tablet Take 10 mg by mouth daily.    [provider]  ferrous sulfate 325 (65 FE) MG tablet Take 325 mg by mouth daily with breakfast.  07/30/19   [provider]  furosemide (LASIX) 20 MG tablet TAKE 1 TABLET BY MOUTH EVERY DAY 08/30/23   Patwardhan, Manish J, MD  hydrALAZINE (APRESOLINE) 25 MG tablet Take 1 tablet (25 mg total) by mouth 3 (three) times daily. 03/22/23   Patwardhan, Anabel Bene, MD  isosorbide mononitrate (IMDUR) 60 MG 24 hr tablet Take 1 tablet (60 mg  total) by mouth daily. Patient taking differently: Take 30 mg by mouth daily. 11/24/22 07/31/23  Pricilla Loveless, MD  latanoprost (XALATAN) 0.005 % ophthalmic solution INSTILL 1 DROP IN LEFT EYE NIGHTLY Patient taking differently: Place 1 drop into the left eye at bedtime. 12/05/21   Rankin, Alford Highland, MD  losartan (COZAAR) 25 MG tablet Take 1 tablet (25 mg total) by mouth 2 (two) times daily. 02/21/23   Patwardhan, Anabel Bene, MD  meclizine (ANTIVERT) 12.5 MG tablet Take 12.5 mg by mouth 3 (three) times daily as needed for dizziness.    [provider]  metoprolol succinate (TOPROL-XL) 50 MG 24 hr tablet Take 1 tablet (50 mg total) by mouth daily. Take with or immediately following a meal. 03/05/23   Patwardhan, Manish J, MD  oxybutynin (DITROPAN-XL) 10 MG 24 hr tablet Take 10 mg by mouth daily.  [provider]  Polyvinyl Alcohol-Povidone (REFRESH OP) Place 1 drop into both eyes at bedtime.    [provider]  pravastatin (PRAVACHOL) 40 MG tablet Take 1 tablet (40 mg total) by mouth daily. 08/14/23   Elder Negus, MD    Physical Exam: Vitals:   08/30/23 2000 08/30/23 2045 08/31/23 0000 08/31/23 0100  BP: (!) 166/91 (!) 151/87 (!) 170/100 (!) 157/80  Pulse: 77 74 84 79  Resp:  18 18 18   Temp:   98 F (36.7 C)   TempSrc:   Oral   SpO2: 98% 97% 97% 96%  Weight:      Height:        Constitutional: NAD, calm, comfortable Vitals:   08/30/23 2000 08/30/23 2045 08/31/23 0000 08/31/23 0100  BP: (!) 166/91 (!) 151/87 (!) 170/100 (!) 157/80  Pulse: 77 74 84 79  Resp:  18 18 18   Temp:   98 F (36.7 C)   TempSrc:   Oral   SpO2: 98% 97% 97% 96%  Weight:      Height:       Eyes: PERRL, lids and conjunctivae normal ENMT: Mucous membranes are moist. Posterior pharynx clear of any exudate or lesions.Normal dentition.  Neck: normal, supple, no masses, no thyromegaly Respiratory: clear to auscultation bilaterally, no wheezing, no crackles. Normal respiratory effort.  No accessory muscle use.  Cardiovascular: Regular rate and rhythm, no murmurs / rubs / gallops. No extremity edema. 2+ pedal pulses.  Abdomen: no tenderness, no masses palpated. No hepatosplenomegaly. Bowel sounds positive.  Musculoskeletal: no clubbing / cyanosis, noted right leg in immobilizer/  no contractures. Normal muscle tone.  Skin: no rashes, lesions, ulcers. No induration Neurologic: CN 2-12 grossly intact. Sensation intact, DTR normal. Strength 5/5 in all 4.  Psychiatric: Normal judgment and insight. Alert and oriented x 3. Normal mood.    Labs on Admission: I have personally reviewed following labs and imaging studies  CBC: Recent Labs  Lab 08/30/23 1719  WBC 6.3  NEUTROABS 4.0  HGB 11.6*  HCT 38.1  MCV 86.8  PLT 208   Basic Metabolic Panel: Recent Labs  Lab 08/30/23 1719  NA 135  K 4.3  CL 97*  CO2 27  GLUCOSE 130*  BUN 19  CREATININE 1.49*  CALCIUM 9.1   GFR: Estimated Creatinine Clearance: 37.9 mL/min (A) (by C-G formula based on SCr of 1.49 mg/dL (H)). Liver Function Tests: No results for input(s): "AST", "ALT", "ALKPHOS", "BILITOT", "PROT", "ALBUMIN" in the last 168 hours. No results for input(s): "LIPASE", "AMYLASE" in the last 168 hours. No results for input(s): "AMMONIA" in the last 168 hours. Coagulation Profile: No results for input(s): "INR", "PROTIME" in the last 168 hours. Cardiac Enzymes: No results for input(s): "CKTOTAL", "CKMB", "CKMBINDEX", "TROPONINI" in the last 168 hours. BNP (last 3 results) No results for input(s): "PROBNP" in the last 8760 hours. HbA1C: No results for input(s): "HGBA1C" in the last 72 hours. CBG: No results for input(s): "GLUCAP" in the last 168 hours. Lipid Profile: No results for input(s): "CHOL", "HDL", "LDLCALC", "TRIG", "CHOLHDL", "LDLDIRECT" in the last 72 hours. Thyroid Function Tests: No results for input(s): "TSH", "T4TOTAL", "FREET4", "T3FREE", "THYROIDAB" in the last 72 hours. Anemia Panel: No  results for input(s): "VITAMINB12", "FOLATE", "FERRITIN", "TIBC", "IRON", "RETICCTPCT" in the last 72 hours. Urine analysis:    Component Value Date/Time   COLORURINE YELLOW 08/30/2020 2150   APPEARANCEUR HAZY (A) 08/30/2020 2150   LABSPEC 1.017 08/30/2020 2150   PHURINE 5.0 08/30/2020 2150  GLUCOSEU NEGATIVE 08/30/2020 2150   HGBUR NEGATIVE 08/30/2020 2150   BILIRUBINUR NEGATIVE 08/30/2020 2150   KETONESUR NEGATIVE 08/30/2020 2150   PROTEINUR NEGATIVE 08/30/2020 2150   UROBILINOGEN 0.2 09/11/2011 1525   NITRITE NEGATIVE 08/30/2020 2150   LEUKOCYTESUR NEGATIVE 08/30/2020 2150    Radiological Exams on Admission: CT KNEE RIGHT WO CONTRAST Result Date: 08/31/2023 CLINICAL DATA:  [address] Street EXAM: CT OF THE RIGHT KNEE WITHOUT CONTRAST TECHNIQUE: Multidetector CT imaging of the right knee was performed according to the standard protocol. Multiplanar CT image reconstructions were also generated. RADIATION DOSE REDUCTION: This exam was performed according to the departmental dose-optimization program which includes automated exposure control, adjustment of the mA and/or kV according to patient size and/or use of iterative reconstruction technique. COMPARISON:  X-ray right knee 08/30/2023 FINDINGS: Bones/Joint/Cartilage Total knee arthroplasty. Acute displaced comminuted distal femoral shaft fracture. Joint effusion. Ligaments Suboptimally assessed by CT. Muscles and Tendons: Grossly unremarkable. Soft tissues Popliteal fossa subcutaneus soft tissue edema with limited evaluation due to streak artifact. IMPRESSION: Acute displaced and comminuted distal femoral shaft fracture just proximal to the total knee arthroplasty. Electronically Signed   By: Tish Frederickson M.D.   On: 08/31/2023 01:42   DG Femur 1V Right Result Date: 08/30/2023 CLINICAL DATA:  Status post fall. EXAM: RIGHT FEMUR 1 VIEW COMPARISON:  None Available. FINDINGS: There is acute fracture deformity involving the distal right femoral  shaft. This extends to within 3 mm of an otherwise intact right knee replacement. There is no evidence of dislocation. Soft tissue swelling is seen surrounding the previously noted fracture site. IMPRESSION: Acute fracture of the distal right femoral shaft. Electronically Signed   By: Aram Candela M.D.   On: 08/30/2023 23:07   DG Knee 2 Views Right Result Date: 08/30/2023 CLINICAL DATA:  Status post fall. EXAM: RIGHT KNEE - 1-2 VIEW COMPARISON:  None Available. FINDINGS: An intact right knee replacement is seen. There is an acute fracture deformity involving the distal right femoral shaft. The distal fracture site is approximately 3 mm from the right femoral prosthesis of the previously noted knee replacement. There is no evidence of dislocation. A large suprapatellar effusion is noted. IMPRESSION: 1. Acute fracture of the distal right femoral shaft. 2. Intact right knee replacement. 3. Large suprapatellar effusion. Electronically Signed   By: Aram Candela M.D.   On: 08/30/2023 23:05   CT ANGIO LOWER EXT BILAT W &/OR WO CONTRAST Result Date: 08/30/2023 CLINICAL DATA:  Recent fall with right knee pain EXAM: CT ANGIOGRAPHY OF ABDOMINAL AORTA WITH ILIOFEMORAL RUNOFF TECHNIQUE: Multidetector CT imaging of the abdomen, pelvis and lower extremities was performed using the standard protocol during bolus administration of intravenous contrast. Multiplanar CT image reconstructions and MIPs were obtained to evaluate the vascular anatomy. RADIATION DOSE REDUCTION: This exam was performed according to the departmental dose-optimization program which includes automated exposure control, adjustment of the mA and/or kV according to patient size and/or use of iterative reconstruction technique. CONTRAST:  80mL OMNIPAQUE IOHEXOL 350 MG/ML SOLN COMPARISON:  None Available. FINDINGS: VASCULAR Aorta: Abdominal aorta demonstrates atherosclerotic calcifications without aneurysmal dilatation or dissection. Celiac: Patent  without evidence of aneurysm, dissection, vasculitis or significant stenosis. SMA: Patent without evidence of aneurysm, dissection, vasculitis or significant stenosis. Renals: Mild atherosclerotic changes are noted. Mild stenosis is noted proximally on the left. IMA: Patent without evidence of aneurysm, dissection, vasculitis or significant stenosis. RIGHT Lower Extremity Inflow: Atherosclerotic calcifications are noted in the iliac artery. No focal stenosis is noted. Runoff: Common femoral  and femoral bifurcation on the right are widely patent. The superficial femoral artery is also widely patent as is the popliteal artery. Popliteal trifurcation is patent with three-vessel runoff to the level of the right ankle. Anterior and posterior tibial arteries continue into the foot. LEFT Lower Extremity Inflow: Atherosclerotic calcifications of the iliac vessels are seen without focal stenosis. Runoff: Common femoral artery and femoral bifurcation are patent. Superficial femoral artery and popliteal artery are widely patent as well. Popliteal trifurcation is patent with three-vessel runoff to the left ankle. Veins: No specific venous abnormality is noted. Review of the MIP images confirms the above findings. NON-VASCULAR Lower chest: Lung bases are free of acute infiltrate or sizable effusion. Hepatobiliary: No focal liver abnormality is seen. Status post cholecystectomy. No biliary dilatation. Pancreas: Unremarkable. No pancreatic ductal dilatation or surrounding inflammatory changes. Spleen: Normal in size without focal abnormality. Adrenals/Urinary Tract: Adrenal glands are within normal limits. Kidneys demonstrate multiple renal cysts bilaterally some of which are hyperdense in nature. These appear stable in appearance from a prior MRI from 2021. Given their long-term stability no further follow-up is recommended. No obstructive changes are seen. The bladder is well distended. Stomach/Bowel: Scattered fecal material is  noted throughout the colon. A knuckle of transverse colon extends into an umbilical fat containing hernia without complicating factors. The appendix is within normal limits. Small bowel and stomach are unremarkable. Lymphatic: No enlarged abdominal or pelvic lymph nodes. Reproductive: Calcified uterine fibroids are noted. No adnexal mass is noted. Other: No free fluid is seen. Umbilical fat containing hernia is noted as previously described. Musculoskeletal: Degenerative changes of lumbar spine are noted. Right knee prosthesis is seen. Just above the knee prosthesis there is a comminuted fracture of the distal femoral metaphysis without significant displacement. No other acute bony abnormality is noted. No patellar dislocation is noted. IMPRESSION: VASCULAR Scattered atherosclerotic calcifications No significant vascular abnormality is noted to correspond with the given clinical history. NON-VASCULAR Comminuted distal right femoral fracture involving the metaphysis just above the knee prosthesis. Multiple renal cysts are noted bilaterally relatively stable from a prior MRI in 2021. No further follow-up is recommended. Fat containing umbilical hernia with a knuckle of transverse colon within. No obstructive changes are noted. Uterine fibroid change. Electronically Signed   By: Alcide Clever M.D.   On: 08/30/2023 22:10   CT Cervical Spine Wo Contrast Result Date: 08/30/2023 CLINICAL DATA:  Status post fall. EXAM: CT CERVICAL SPINE WITHOUT CONTRAST TECHNIQUE: Multidetector CT imaging of the cervical spine was performed without intravenous contrast. Multiplanar CT image reconstructions were also generated. RADIATION DOSE REDUCTION: This exam was performed according to the departmental dose-optimization program which includes automated exposure control, adjustment of the mA and/or kV according to patient size and/or use of iterative reconstruction technique. COMPARISON:  December 12, 2020 FINDINGS: Alignment: Normal.  Skull base and vertebrae: No acute fracture. No primary bone lesion or focal pathologic process. Soft tissues and spinal canal: No prevertebral fluid or swelling. No visible canal hematoma. Disc levels: Mild endplate sclerosis, moderate severity anterior osteophyte formation and moderate severity posterior bony spurring are seen at the levels of C3-C4, C4-C5, C5-C6 and C6-C7. Moderate severity intervertebral disc space narrowing is seen at C5-C6 and C6-C7, with mild intervertebral disc space narrowing present throughout the remainder of the cervical spine. Bilateral marked severity multilevel facet joint hypertrophy is noted. Upper chest: Negative. Other: None. IMPRESSION: 1. No acute fracture or subluxation in the cervical spine. 2. Moderate severity multilevel degenerative changes, as described above.  Electronically Signed   By: Aram Candela M.D.   On: 08/30/2023 19:53   CT Head Wo Contrast Result Date: 08/30/2023 CLINICAL DATA:  Status post fall. EXAM: CT HEAD WITHOUT CONTRAST TECHNIQUE: Contiguous axial images were obtained from the base of the skull through the vertex without intravenous contrast. RADIATION DOSE REDUCTION: This exam was performed according to the departmental dose-optimization program which includes automated exposure control, adjustment of the mA and/or kV according to patient size and/or use of iterative reconstruction technique. COMPARISON:  March 15, 2023 FINDINGS: Brain: There is mild cerebral atrophy with widening of the extra-axial spaces and ventricular dilatation. There are areas of decreased attenuation within the white matter tracts of the supratentorial brain, consistent with microvascular disease changes. Vascular: Marked severity bilateral cavernous carotid artery calcification is noted. Skull: Normal. Negative for fracture or focal lesion. Sinuses/Orbits: No acute finding. Other: It should be noted that the study is markedly limited secondary to patient motion.  IMPRESSION: 1. Markedly limited study secondary to patient motion. 2. Generalized cerebral atrophy with chronic white matter small vessel ischemic changes. 3. No acute intracranial abnormality. Electronically Signed   By: Aram Candela M.D.   On: 08/30/2023 19:50    EKG: Independently reviewed.   Assessment/Plan  Right periprosthetic femur fracture  - npo midnight  for planned repair in am  - current in knee immobilizer  -supportive care with pain medications   Hx of Stable Angina  - last cardiac cath  4/24  noted nonobstructive disease no change from prior cath  - last echo  -will continue with cardiac medications once med rec has been reconcilled   CHF pef  - no acute exacerbation currently   CKDIIIb-IV -at baseline renal function  repeat noted 1.7 -will hold lasix, and give gentle ivfs while npo  -follow lab values  OSA - cpap at bedtime   HTN -stable resume home regimen once med recompleted   HLD -resume statin as able   DMII - iss/fs , q4h fs while npo    Surgical Clearance: Patient current commorbidities are currently optimized however due to known CAD/CHFpef / patient is at increase risk and has a 10% risk of cardiac morbidity despite surgery not being high risk. However ensuring no wide swings in hemodynamics will ensure best outcomes. Currently as patient is optimized  patient is cleared for surgery.    DVT prophylaxis: per ortho Code Status: Abn CE  -due to demand  -noted no significant delta  -will continue to monitor ce over night to be complete, echo ordered for am   Family Communication:  Disposition Plan: patient  expected to be admitted greater than 2 midnights  Consults called: Ortho: Dr Christell Constant Admission status: med tele   Lurline Del MD [facility]   If 7PM-7AM, please contact night-coverage [email or web address] Password [email or web address]  08/31/2023, 2:02 AM

## 2023-08-31 NOTE — Consult Note (Signed)
Orthopaedic Trauma Service (OTS) Consult   Patient ID: Jeanne [name] MRN: [id number] DOB/AGE: 85-Apr-1937 85 y.o.  Reason for Consult:Right periprosthetic distal femur fracture Referring Physician: Dr. Delmer Islam, MD Cyndia Skeeters  HPI: Jeanne [name] is an 87 y.o. female who is being seen in consultation at the request of Dr. Christell Constant for evaluation of right periprosthetic distal femur fracture.  Patient had a ground-level fall sustaining the above injury.  Due to the complexity of her injury Dr. Christell Constant felt that this would best be treated by an orthopedic traumatologist.  Patient was seen and evaluated in the preoperative holding area.  Her daughter is currently at bedside.  She states that she walks with a walker and a rollator and occasionally with a cane.  She has been having some pain in bilateral knees.  She had her knee replacement multiple years ago.  She denies any other injuries.  She lives at home with her family.  Past Medical History:  Diagnosis Date   Anemia    Arthritis    Carotid arterial disease (HCC)    COVID-19    Diabetes mellitus    Hyperlipidemia    Hypertension    Migraine    Obesity    Reflux    Sleep apnea     Past Surgical History:  Procedure Laterality Date   CARDIAC CATHETERIZATION N/A 10/12/2015   Procedure: Left Heart Cath and Coronary Angiography;  Surgeon: Lyn Records, MD;  Location: [facility] INVASIVE CV LAB;  Service: Cardiovascular;  Laterality: N/A;   CHOLECYSTECTOMY  50 yrs ago   JOINT REPLACEMENT Right 2005   knee   LEFT HEART CATH AND CORONARY ANGIOGRAPHY N/A 12/07/2022   Procedure: LEFT HEART CATH AND CORONARY ANGIOGRAPHY;  Surgeon: Elder Negus, MD;  Location: [facility] INVASIVE CV LAB;  Service: Cardiovascular;  Laterality: N/A;   WISDOM TOOTH EXTRACTION      Family History  Problem Relation Age of Onset   Heart attack Father    Heart attack Mother     Social History:  reports that she has never smoked. She has never used smokeless tobacco.  She reports that she does not drink alcohol and does not use drugs.  Allergies:  Allergies  Allergen Reactions   Amoxicillin Nausea Only    Has patient had a PCN reaction causing immediate rash, facial/tongue/throat swelling, SOB or lightheadedness with hypotension: "yes, i was dizzy" Has patient had a PCN reaction causing severe rash involving mucus membranes or skin necrosis: no Did a PCN reaction that required hospitalization : no, called the DR. Did PCN reaction occurring within the last 10 years: unk if all of the above answers are "NO", then may proceed with Cephalosporin use.    Canagliflozin Nausea Only and Other (See Comments)    Dizziness  Dizziness  Dizziness   Enalapril Maleate     Other reaction(s): angioedema   Liraglutide     Other reaction(s): Nausea and dizziness   Pioglitazone     Other reaction(s): macular edema   Tramadol Hcl     Other reaction(s): nausea, headache and dizziness    Medications:  No current facility-administered medications on file prior to encounter.   Current Outpatient Medications on File Prior to Encounter  Medication Sig Dispense Refill   allopurinol (ZYLOPRIM) 100 MG tablet Take 100 mg by mouth daily.     amLODipine (NORVASC) 5 MG tablet Take 1 tablet (5 mg total) by mouth daily. 90 tablet 3   aspirin EC (ASPIRIN LOW DOSE) 81  MG tablet Take 1 tablet (81 mg total) by mouth daily. 90 tablet 0   Cholecalciferol 50 MCG (2000 UT) TABS Take by mouth.     escitalopram (LEXAPRO) 10 MG tablet TAKE 2 TABLETS BY MOUTH EVERY DAY 180 tablet 2   esomeprazole (NEXIUM) 20 MG capsule Take 1 capsule (20 mg total) by mouth daily at 12 noon. 90 capsule 3   ezetimibe (ZETIA) 10 MG tablet Take 10 mg by mouth daily.     ferrous sulfate 325 (65 FE) MG tablet Take 325 mg by mouth at bedtime.     furosemide (LASIX) 20 MG tablet TAKE 1 TABLET BY MOUTH EVERY DAY 90 tablet 3   hydrALAZINE (APRESOLINE) 25 MG tablet Take 1 tablet (25 mg total) by mouth 3 (three)  times daily. 270 tablet 3   insulin glargine (LANTUS) 100 UNIT/ML injection Inject 65 Units into the skin daily before breakfast.     latanoprost (XALATAN) 0.005 % ophthalmic solution INSTILL 1 DROP IN LEFT EYE NIGHTLY 7.5 mL 12   lidocaine (HM LIDOCAINE PATCH) 4 % Place 1 patch onto the skin.     losartan (COZAAR) 25 MG tablet Take 1 tablet (25 mg total) by mouth 2 (two) times daily. 180 tablet 1   meclizine (ANTIVERT) 12.5 MG tablet Take 12.5 mg by mouth 3 (three) times daily as needed for dizziness.     metoprolol succinate (TOPROL-XL) 50 MG 24 hr tablet Take 1 tablet (50 mg total) by mouth daily. Take with or immediately following a meal. 90 tablet 3   nitroGLYCERIN (NITROSTAT) 0.4 MG SL tablet PLACE 1 TABLET UNDER THE TONGUE AS NEEDED FOR CHEST PAIN. 25 tablet 3   oxybutynin (DITROPAN-XL) 10 MG 24 hr tablet Take 10 mg by mouth daily.     Polyvinyl Alcohol-Povidone (REFRESH OP) Place 1 drop into both eyes at bedtime.     pravastatin (PRAVACHOL) 40 MG tablet Take 1 tablet (40 mg total) by mouth daily. 90 tablet 2   isosorbide mononitrate (IMDUR) 60 MG 24 hr tablet Take 1 tablet (60 mg total) by mouth daily. (Patient taking differently: Take 30 mg by mouth daily.) 30 tablet 0     ROS: Constitutional: No fever or chills Vision: No changes in vision ENT: No difficulty swallowing CV: No chest pain Pulm: No SOB or wheezing GI: No nausea or vomiting GU: No urgency or inability to hold urine Skin: No poor wound healing Neurologic: No numbness or tingling Psychiatric: No depression or anxiety Heme: No bruising Allergic: No reaction to medications or food   Exam: Blood pressure (!) 160/78, pulse 78, temperature 98.4 F (36.9 C), temperature source Oral, resp. rate 17, height 5\' 3"  (1.6 m), weight (!) 139 kg, SpO2 94%. General: No acute distress Orientation: Awake alert and oriented x 3 Mood and Affect: Cooperative and pleasant Gait: Unable to assess due to her fracture Coordination and  balance: Within normal limits   Right lower extremity: Knee immobilizer in some place.  The femur is deformity through the leg.  Compartments are soft compressible.  Motor and sensory function is intact to the right lower extremity.  She has a warm well-perfused foot with brisk cap refill less than 2 seconds.  Left lower extremity: Skin without lesions. No tenderness to palpation. Full painless ROM, full strength in each muscle groups without evidence of instability.   Medical Decision Making: Data: Imaging: X-rays and CT scan are reviewed which shows a periprosthetic distal femur fracture with significant comminution and displacement.  Labs:  Results for orders placed or performed during the hospital encounter of 08/30/23 (from the past 24 hours)  Basic metabolic panel     Status: Abnormal   Collection Time: 08/30/23  5:19 PM  Result Value Ref Range   Sodium 135 135 - 145 mmol/L   Potassium 4.3 3.5 - 5.1 mmol/L   Chloride 97 (L) 98 - 111 mmol/L   CO2 27 22 - 32 mmol/L   Glucose, Bld 130 (H) 70 - 99 mg/dL   BUN 19 8 - 23 mg/dL   Creatinine, Ser 9.60 (H) 0.44 - 1.00 mg/dL   Calcium 9.1 8.9 - 45.4 mg/dL   GFR, Estimated 34 (L) >60 mL/min   Anion gap 11 5 - 15  CBC with Differential     Status: Abnormal   Collection Time: 08/30/23  5:19 PM  Result Value Ref Range   WBC 6.3 4.0 - 10.5 K/uL   RBC 4.39 3.87 - 5.11 MIL/uL   Hemoglobin 11.6 (L) 12.0 - 15.0 g/dL   HCT 09.8 11.9 - 14.7 %   MCV 86.8 80.0 - 100.0 fL   MCH 26.4 26.0 - 34.0 pg   MCHC 30.4 30.0 - 36.0 g/dL   RDW 82.9 56.2 - 13.0 %   Platelets 208 150 - 400 K/uL   nRBC 0.0 0.0 - 0.2 %   Neutrophils Relative % 63 %   Neutro Abs 4.0 1.7 - 7.7 K/uL   Lymphocytes Relative 22 %   Lymphs Abs 1.4 0.7 - 4.0 K/uL   Monocytes Relative 10 %   Monocytes Absolute 0.6 0.1 - 1.0 K/uL   Eosinophils Relative 3 %   Eosinophils Absolute 0.2 0.0 - 0.5 K/uL   Basophils Relative 1 %   Basophils Absolute 0.0 0.0 - 0.1 K/uL   Immature  Granulocytes 1 %   Abs Immature Granulocytes 0.04 0.00 - 0.07 K/uL  ABO/Rh     Status: None   Collection Time: 08/30/23  5:19 PM  Result Value Ref Range   ABO/RH(D)      B POS Performed at [facility], [address] [place], [place] [address]   Type and screen [facility]     Status: None   Collection Time: 08/30/23 11:55 PM  Result Value Ref Range   ABO/RH(D) B POS    Antibody Screen NEG    Sample Expiration      09/02/2023,2359 Performed at [facility], [address] [place], [place] [address]   CBG monitoring, ED     Status: Abnormal   Collection Time: 08/31/23  2:57 AM  Result Value Ref Range   Glucose-Capillary 204 (H) 70 - 99 mg/dL  Surgical PCR screen     Status: Abnormal   Collection Time: 08/31/23  5:19 AM   Specimen: Nasal Mucosa; Nasal Swab  Result Value Ref Range   MRSA, PCR NEGATIVE NEGATIVE   Staphylococcus aureus POSITIVE (A) NEGATIVE  CBC     Status: Abnormal   Collection Time: 08/31/23  6:01 AM  Result Value Ref Range   WBC 8.2 4.0 - 10.5 K/uL   RBC 4.14 3.87 - 5.11 MIL/uL   Hemoglobin 11.1 (L) 12.0 - 15.0 g/dL   HCT 95.2 (L) 84.1 - 32.4 %   MCV 84.5 80.0 - 100.0 fL   MCH 26.8 26.0 - 34.0 pg   MCHC 31.7 30.0 - 36.0 g/dL   RDW 40.1 02.7 - 25.3 %   Platelets 191 150 - 400 K/uL  nRBC 0.0 0.0 - 0.2 %  Basic metabolic panel     Status: Abnormal   Collection Time: 08/31/23  6:01 AM  Result Value Ref Range   Sodium 134 (L) 135 - 145 mmol/L   Potassium 4.5 3.5 - 5.1 mmol/L   Chloride 95 (L) 98 - 111 mmol/L   CO2 29 22 - 32 mmol/L   Glucose, Bld 213 (H) 70 - 99 mg/dL   BUN 18 8 - 23 mg/dL   Creatinine, Ser 2.95 (H) 0.44 - 1.00 mg/dL   Calcium 9.0 8.9 - 28.4 mg/dL   GFR, Estimated 29 (L) >60 mL/min   Anion gap 10 5 - 15  Glucose, capillary     Status: Abnormal   Collection Time: 08/31/23  8:14 AM  Result Value Ref Range   Glucose-Capillary 187 (H) 70 - 99 mg/dL     Imaging or Labs ordered: None  Medical history  and chart was reviewed and case discussed with medical provider.  Assessment/Plan: 87 year old female status post ground-level fall with a right periprosthetic distal femur fracture.  Due to the unstable nature of her injury I recommend proceeding with open reduction internal fixation.  Risks and benefits were discussed with the patient.  Risks included but not limited to bleeding, infection, malunion, nonunion, hardware failure, hardware rotation, nerve or blood vessel injury, DVT, even the possibility anesthetic complications.  She agreed to proceed with surgery and consent was obtained.  Routine postoperative course was discussed with the patient and her daughter.  Including weightbearing status as well as postoperative recovery.  All questions were answered to their satisfaction.  Roby Lofts, MD [facility] [phone] (office) [email or web address]

## 2023-08-31 NOTE — Anesthesia Procedure Notes (Signed)
Procedure Name: Intubation Date/Time: 08/31/2023 10:19 AM  Performed by: Dairl Ponder, CRNAPre-anesthesia Checklist: Patient identified, Emergency Drugs available, Suction available and Patient being monitored Patient Re-evaluated:Patient Re-evaluated prior to induction Oxygen Delivery Method: Circle System Utilized Preoxygenation: Pre-oxygenation with 100% oxygen Induction Type: IV induction Ventilation: Mask ventilation without difficulty Laryngoscope Size: Mac and 3 Grade View: Grade I Tube type: Oral Tube size: 7.0 mm Number of attempts: 1 Airway Equipment and Method: Stylet and Oral airway Placement Confirmation: ETT inserted through vocal cords under direct vision, positive ETCO2 and breath sounds checked- equal and bilateral Secured at: 23 cm Tube secured with: Tape Dental Injury: Teeth and Oropharynx as per pre-operative assessment

## 2023-08-31 NOTE — Progress Notes (Signed)
PT Cancellation Note  Patient Details Name: Jeanne [name] MRN: [id number] DOB: Aug 04, 1936   Cancelled Treatment:    Reason Eval/Treat Not Completed: Medical issues which prohibited therapy. Pt scheduled for R distal femur ORIF 12/27. Acute PT to follow after surgery.    Hilton Cork, PT, DPT Secure Chat Preferred  Rehab Office [phone]   Arturo Morton Brion Aliment 08/31/2023, 7:45 AM

## 2023-08-31 NOTE — Progress Notes (Signed)
[facility] Pre-op Review  Pre-op checklist: To be completed by bedside RN  NPO: Ordered  Labs: Elevated glucose. CBGs are not ordered (Pt is diabetic), PCR: in process  Consent: Not ordered  H&P: Hospitalist  Vitals: BP elevated  O2 requirements: RA  MAR/PTA review: No home diabetes nor BB med ordered, baby ASA at home  IV: 20g  Floor nurse name:  Debbrah Alar, RN   Additional info:

## 2023-08-31 NOTE — Anesthesia Postprocedure Evaluation (Signed)
Anesthesia Post Note  Patient: Jeanne [name]  Procedure(s) Performed: OPEN REDUCTION INTERNAL FIXATION (ORIF) DISTAL FEMUR FRACTURE (Right)     Patient location during evaluation: PACU Anesthesia Type: General Level of consciousness: awake and alert Pain management: pain level controlled Vital Signs Assessment: post-procedure vital signs reviewed and stable Respiratory status: spontaneous breathing, nonlabored ventilation, respiratory function stable and patient connected to nasal cannula oxygen Cardiovascular status: blood pressure returned to baseline and stable Postop Assessment: no apparent nausea or vomiting Anesthetic complications: no  No notable events documented.  Last Vitals:  Vitals:   08/31/23 1200 08/31/23 1235  BP: (!) 168/81 (!) 162/67  Pulse: 83 82  Resp: 15 16  Temp:  36.9 C  SpO2: 92% 100%    Last Pain:  Vitals:   08/31/23 1235  TempSrc: Oral  PainSc:                  Jeanne [name],W. EDMOND

## 2023-08-31 NOTE — Progress Notes (Signed)
Pt off the unit to OR.  

## 2023-09-01 DIAGNOSIS — S728X1A Other fracture of right femur, initial encounter for closed fracture: Secondary | ICD-10-CM | POA: Diagnosis not present

## 2023-09-01 LAB — RENAL FUNCTION PANEL
Albumin: 2.4 g/dL — ABNORMAL LOW (ref 3.5–5.0)
Anion gap: 7 (ref 5–15)
BUN: 24 mg/dL — ABNORMAL HIGH (ref 8–23)
CO2: 31 mmol/L (ref 22–32)
Calcium: 8.5 mg/dL — ABNORMAL LOW (ref 8.9–10.3)
Chloride: 94 mmol/L — ABNORMAL LOW (ref 98–111)
Creatinine, Ser: 1.91 mg/dL — ABNORMAL HIGH (ref 0.44–1.00)
GFR, Estimated: 25 mL/min — ABNORMAL LOW (ref >60)
Glucose, Bld: 202 mg/dL — ABNORMAL HIGH (ref 70–99)
Phosphorus: 3.3 mg/dL (ref 2.5–4.6)
Potassium: 4.7 mmol/L (ref 3.5–5.1)
Sodium: 132 mmol/L — ABNORMAL LOW (ref 135–145)

## 2023-09-01 LAB — GLUCOSE, CAPILLARY
Glucose-Capillary: 170 mg/dL — ABNORMAL HIGH (ref 70–99)
Glucose-Capillary: 193 mg/dL — ABNORMAL HIGH (ref 70–99)
Glucose-Capillary: 198 mg/dL — ABNORMAL HIGH (ref 70–99)
Glucose-Capillary: 198 mg/dL — ABNORMAL HIGH (ref 70–99)
Glucose-Capillary: 202 mg/dL — ABNORMAL HIGH (ref 70–99)
Glucose-Capillary: 210 mg/dL — ABNORMAL HIGH (ref 70–99)

## 2023-09-01 LAB — CBC
HCT: 30.8 % — ABNORMAL LOW (ref 36.0–46.0)
Hemoglobin: 9.6 g/dL — ABNORMAL LOW (ref 12.0–15.0)
MCH: 26.7 pg (ref 26.0–34.0)
MCHC: 31.2 g/dL (ref 30.0–36.0)
MCV: 85.6 fL (ref 80.0–100.0)
Platelets: 170 10*3/uL (ref 150–400)
RBC: 3.6 MIL/uL — ABNORMAL LOW (ref 3.87–5.11)
RDW: 13.9 % (ref 11.5–15.5)
WBC: 10 10*3/uL (ref 4.0–10.5)
nRBC: 0 % (ref 0.0–0.2)

## 2023-09-01 MED ORDER — ALUM & MAG HYDROXIDE-SIMETH 200-200-20 MG/5ML PO SUSP
30.0000 mL | Freq: Four times a day (QID) | ORAL | Status: DC | PRN
  Administered 2023-09-01: 30 mL via ORAL
  Filled 2023-09-01: qty 30

## 2023-09-01 MED ORDER — ENOXAPARIN SODIUM 60 MG/0.6ML IJ SOSY
60.0000 mg | PREFILLED_SYRINGE | Freq: Two times a day (BID) | INTRAMUSCULAR | Status: DC
  Administered 2023-09-01 – 2023-09-03 (×4): 60 mg via SUBCUTANEOUS
  Filled 2023-09-01 (×4): qty 0.6

## 2023-09-01 MED ORDER — PANTOPRAZOLE SODIUM 40 MG PO TBEC
40.0000 mg | DELAYED_RELEASE_TABLET | Freq: Every day | ORAL | Status: DC
  Administered 2023-09-02 – 2023-09-04 (×3): 40 mg via ORAL
  Filled 2023-09-01 (×3): qty 1

## 2023-09-01 MED ORDER — ENOXAPARIN SODIUM 60 MG/0.6ML IJ SOSY: 60.0000 mg | PREFILLED_SYRINGE | Freq: Two times a day (BID) | INTRAMUSCULAR | Status: DC

## 2023-09-01 MED ORDER — MELATONIN 5 MG PO TABS
5.0000 mg | ORAL_TABLET | Freq: Every evening | ORAL | Status: AC | PRN
  Administered 2023-09-01 – 2023-09-03 (×3): 5 mg via ORAL
  Filled 2023-09-01 (×3): qty 1

## 2023-09-01 MED ORDER — HALOPERIDOL LACTATE 5 MG/ML IJ SOLN
1.0000 mg | Freq: Once | INTRAMUSCULAR | Status: AC
  Administered 2023-09-01: 1 mg via INTRAVENOUS
  Filled 2023-09-01: qty 1

## 2023-09-01 MED ORDER — DIPHENHYDRAMINE-ZINC ACETATE 2-0.1 % EX CREA
TOPICAL_CREAM | Freq: Three times a day (TID) | CUTANEOUS | Status: AC | PRN
  Filled 2023-09-01: qty 28

## 2023-09-01 NOTE — TOC Initial Note (Signed)
Transition of Care [facility]) - Initial/Assessment Note    Patient Details  Name: Jeanne [name] MRN: [id number] Date of Birth: 12-05-1935  Transition of Care [facility]) CM/SW Contact:    Carmina Miller, LCSWA Phone Number: 09/01/2023, 9:22 AM  Clinical Narrative:                  CSW spoke with pt's daughter Dois Davenport concerning SNF recommendations, daughter requested call back in about 30 minutes as she is bathing pt.        Patient Goals and CMS Choice            Expected Discharge Plan and Services                                              Prior Living Arrangements/Services                       Activities of Daily Living   ADL Screening (condition at time of admission) Independently performs ADLs?: Yes (appropriate for developmental age) Is the patient deaf or have difficulty hearing?: No Does the patient have difficulty seeing, even when wearing glasses/contacts?: No Does the patient have difficulty concentrating, remembering, or making decisions?: No  Permission Sought/Granted                  Emotional Assessment              Admission diagnosis:  Hip fracture (HCC) [S72.009A] Closed comminuted intertrochanteric fracture of right femur, initial encounter (HCC) [S72.141A] Patient Active Problem List   Diagnosis Date Noted   Hip fracture (HCC) 08/31/2023   Closed nondisplaced segmental fracture of shaft of right femur (HCC) 08/31/2023   Chest pain 12/06/2022   Mixed hyperlipidemia 10/27/2022   Pseudophakia, both eyes 06/05/2022   Dizziness 05/17/2022   Excessive cerumen in both ear canals 05/17/2022   GERD (gastroesophageal reflux disease) 05/02/2021   Chronic diastolic CHF (congestive heart failure) (HCC) 10/07/2020   Moderate nonproliferative diabetic retinopathy of both eyes (HCC) 05/25/2020   Chorioretinal scar 05/25/2020   Left epiretinal membrane 05/25/2020   Posterior vitreous detachment of both eyes 05/25/2020    Acute respiratory failure with hypoxia (HCC) 10/11/2019   CAP (community acquired pneumonia) 10/11/2019   Diastolic dysfunction, left ventricle 04/05/2019   Coronary artery disease involving native coronary artery of native heart without angina pectoris 11/23/2018   Hyperlipidemia 11/23/2018   Unstable angina (HCC) 10/12/2015   Abnormal myocardial perfusion study 10/12/2015   NSVT (nonsustained ventricular tachycardia) (HCC) 10/11/2015   Essential hypertension 10/11/2015   CKD (chronic kidney disease), stage IV (HCC) 10/11/2015   Atypical chest pain    Angina pectoris (HCC) 10/08/2015   DM2 (diabetes mellitus, type 2) (HCC) 10/08/2015   AKI (acute kidney injury) (HCC)    Abscess of left arm 01/01/2013   Migraine    Morbid obesity (HCC)    Obstructive sleep apnea    Stenosis of left carotid artery    Reflux    Anemia of chronic disease    PCP:  Renford Dills, MD Pharmacy:   [id number] - [place], [place] - [address]  [address] Phone: [phone] Fax: [phone]     Social Drivers of Health (SDOH) Social History: SDOH Screenings   Food Insecurity: No Food Insecurity (08/31/2023)  Housing: Low Risk  (08/31/2023)  Transportation Needs: No Transportation Needs (08/31/2023)  Utilities: Not At Risk (08/31/2023)  Depression (PHQ2-9): Low Risk  (05/29/2019)  Tobacco Use: Low Risk  (08/31/2023)   SDOH Interventions:     Readmission Risk Interventions     No data to display

## 2023-09-01 NOTE — NC FL2 (Signed)
[place] MEDICAID FL2 LEVEL OF CARE FORM     IDENTIFICATION  Patient Name: Jeanne [name] Birthdate: December 05, 1935 Sex: female Admission Date (Current Location): 08/30/2023  [facility] and [place] Number:  Producer, television/film/video and Address:  [facility] [place], [address] [place], [place] [address]      Provider Number: [id number]  Attending Physician Name and Address:  Barnetta Chapel, MD  Relative Name and Phone Number:  Garnell Kilburn [phone]    Current Level of Care: Hospital Recommended Level of Care: Skilled Nursing Facility Prior Approval Number:    Date Approved/Denied:   PASRR Number: [id number]  Discharge Plan: SNF    Current Diagnoses: Patient Active Problem List   Diagnosis Date Noted   Hip fracture (HCC) 08/31/2023   Closed nondisplaced segmental fracture of shaft of right femur (HCC) 08/31/2023   Chest pain 12/06/2022   Mixed hyperlipidemia 10/27/2022   Pseudophakia, both eyes 06/05/2022   Dizziness 05/17/2022   Excessive cerumen in both ear canals 05/17/2022   GERD (gastroesophageal reflux disease) 05/02/2021   Chronic diastolic CHF (congestive heart failure) (HCC) 10/07/2020   Moderate nonproliferative diabetic retinopathy of both eyes (HCC) 05/25/2020   Chorioretinal scar 05/25/2020   Left epiretinal membrane 05/25/2020   Posterior vitreous detachment of both eyes 05/25/2020   Acute respiratory failure with hypoxia (HCC) 10/11/2019   CAP (community acquired pneumonia) 10/11/2019   Diastolic dysfunction, left ventricle 04/05/2019   Coronary artery disease involving native coronary artery of native heart without angina pectoris 11/23/2018   Hyperlipidemia 11/23/2018   Unstable angina (HCC) 10/12/2015   Abnormal myocardial perfusion study 10/12/2015   NSVT (nonsustained ventricular tachycardia) (HCC) 10/11/2015   Essential hypertension 10/11/2015   CKD (chronic kidney disease), stage IV (HCC) 10/11/2015   Atypical  chest pain    Angina pectoris (HCC) 10/08/2015   DM2 (diabetes mellitus, type 2) (HCC) 10/08/2015   AKI (acute kidney injury) (HCC)    Abscess of left arm 01/01/2013   Migraine    Morbid obesity (HCC)    Obstructive sleep apnea    Stenosis of left carotid artery    Reflux    Anemia of chronic disease     Orientation RESPIRATION BLADDER Height & Weight     Self, Situation, Time, Place  O2 ([place] 3L) Incontinent, External catheter Weight: (!) 306 lb 7 oz (139 kg) Height:  5\' 3"  (160 cm)  BEHAVIORAL SYMPTOMS/MOOD NEUROLOGICAL BOWEL NUTRITION STATUS      Continent Diet (see dc summary)  AMBULATORY STATUS COMMUNICATION OF NEEDS Skin   Extensive Assist Verbally Surgical wounds (08/31/23 Closed incision leg right)                       Personal Care Assistance Level of Assistance  Bathing, Feeding, Dressing Bathing Assistance: Limited assistance Feeding assistance: Independent Dressing Assistance: Limited assistance     Functional Limitations Info  Sight, Hearing, Speech Sight Info: Adequate Hearing Info: Adequate Speech Info: Adequate    SPECIAL CARE FACTORS FREQUENCY  PT (By licensed PT), OT (By licensed OT)     PT Frequency: 5x week OT Frequency: 5x week            Contractures Contractures Info: Not present    Additional Factors Info  Code Status, Allergies Code Status Info: full Allergies Info: Amoxicillin  Canagliflozin  Enalapril Maleate  Liraglutide  Pioglitazone  Tramadol Hcl           Current Medications (09/01/2023):  This is the current hospital active medication list Current Facility-Administered Medications  Medication Dose Route Frequency Provider Last Rate Last Admin   (feeding supplement) PROSource Plus liquid 30 mL  30 mL Oral BID BM Berton Mount I, MD   30 mL at 09/01/23 0839   acetaminophen (TYLENOL) tablet 650 mg  650 mg Oral Q6H West Bali, PA-C   650 mg at 09/01/23 0636   allopurinol (ZYLOPRIM) tablet 100 mg  100 mg Oral  Daily West Bali, PA-C   100 mg at 09/01/23 0833   alum & mag hydroxide-simeth (MAALOX/MYLANTA) 200-200-20 MG/5ML suspension 30 mL  30 mL Oral Q6H PRN Anthoney Harada, NP   30 mL at 09/01/23 0145   amLODipine (NORVASC) tablet 5 mg  5 mg Oral Daily West Bali, PA-C   5 mg at 09/01/23 1914   docusate sodium (COLACE) capsule 100 mg  100 mg Oral BID West Bali, PA-C   100 mg at 09/01/23 0831   enoxaparin (LOVENOX) injection 40 mg  40 mg Subcutaneous Q24H Thyra Breed A, PA-C   40 mg at 09/01/23 0830   escitalopram (LEXAPRO) tablet 20 mg  20 mg Oral Daily West Bali, PA-C   20 mg at 09/01/23 7829   ezetimibe (ZETIA) tablet 10 mg  10 mg Oral Daily West Bali, PA-C   10 mg at 09/01/23 5621   hydrALAZINE (APRESOLINE) tablet 25 mg  25 mg Oral TID West Bali, PA-C   25 mg at 09/01/23 0831   insulin glargine-yfgn (SEMGLEE) injection 35 Units  35 Units Subcutaneous Daily West Bali, PA-C   35 Units at 09/01/23 0830   isosorbide mononitrate (IMDUR) 24 hr tablet 60 mg  60 mg Oral Daily Thyra Breed A, PA-C   60 mg at 09/01/23 3086   methocarbamol (ROBAXIN) tablet 500 mg  500 mg Oral Q6H PRN West Bali, PA-C   500 mg at 09/01/23 5784   Or   methocarbamol (ROBAXIN) injection 500 mg  500 mg Intravenous Q6H PRN West Bali, PA-C       metoCLOPramide (REGLAN) tablet 5-10 mg  5-10 mg Oral Q8H PRN Sharon Seller, Sarah A, PA-C       Or   metoCLOPramide (REGLAN) injection 5-10 mg  5-10 mg Intravenous Q8H PRN West Bali, PA-C       metoprolol succinate (TOPROL-XL) 24 hr tablet 50 mg  50 mg Oral Daily Thyra Breed A, PA-C   50 mg at 09/01/23 6962   morphine (PF) 2 MG/ML injection 0.5 mg  0.5 mg Intravenous Q2H PRN West Bali, PA-C   0.5 mg at 09/01/23 0111   multivitamin with minerals tablet 1 tablet  1 tablet Oral Daily Berton Mount I, MD   1 tablet at 09/01/23 9528   nitroGLYCERIN (NITROSTAT) SL tablet 0.4 mg  0.4 mg Sublingual Q5 min PRN West Bali, PA-C       ondansetron (ZOFRAN) tablet 4 mg  4 mg Oral Q6H PRN Sharon Seller, Sarah A, PA-C       Or   ondansetron (ZOFRAN) injection 4 mg  4 mg Intravenous Q6H PRN McClung, Sarah A, PA-C       oxyCODONE (Oxy IR/ROXICODONE) immediate release tablet 5 mg  5 mg Oral Q4H PRN Thyra Breed A, PA-C   5 mg at 09/01/23 0636   pantoprazole (PROTONIX) injection 40 mg  40 mg Intravenous Daily Thyra Breed A, PA-C   40 mg at 09/01/23 [phone]  polyethylene glycol (MIRALAX / GLYCOLAX) packet 17 g  17 g Oral Daily PRN West Bali, PA-C         Discharge Medications: Please see discharge summary for a list of discharge medications.  Relevant Imaging Results:  Relevant Lab Results:   Additional Information SSN: [id number]  Redonna Wilbert M Jameca Chumley, LCSWA

## 2023-09-01 NOTE — TOC Progression Note (Addendum)
Transition of Care [facility]) - Progression Note    Patient Details  Name: JAZMANE LORETTE MRN: [id number] Date of Birth: 1936/04/04  Transition of Care [facility]) CM/SW Contact  Donnalee Curry, LCSWA Phone Number: 09/01/2023, 10:34 AM  Clinical Narrative:      SW spoke with pt's daughters Andrey Campanile and Claris Gower [phone]) explained process for SNF and insurance auth. Andrey Campanile and Claris Gower will review medicare.gov list to determine preferred facilities and f/u with SW.   Update 1142am SW received callback from pts daughter Andrey Campanile and Claris Gower, preference for [facility] and [facility]. SW explained the need to send out to some backup facilities. They do not want [facility], [address] or [facility].       Expected Discharge Plan and Services                                               Social Determinants of Health (SDOH) Interventions SDOH Screenings   Food Insecurity: No Food Insecurity (08/31/2023)  Housing: Low Risk  (08/31/2023)  Transportation Needs: No Transportation Needs (08/31/2023)  Utilities: Not At Risk (08/31/2023)  Depression (PHQ2-9): Low Risk  (05/29/2019)  Tobacco Use: Low Risk  (08/31/2023)    Readmission Risk Interventions     No data to display

## 2023-09-01 NOTE — Plan of Care (Signed)
  Problem: Education: Goal: Knowledge of General Education information will improve Description: Including pain rating scale, medication(s)/side effects and non-pharmacologic comfort measures Outcome: Progressing   Problem: Health Behavior/Discharge Planning: Goal: Ability to manage health-related needs will improve Outcome: Progressing   Problem: Nutrition: Goal: Adequate nutrition will be maintained Outcome: Progressing   Problem: Elimination: Goal: Will not experience complications related to bowel motility Outcome: Progressing Goal: Will not experience complications related to urinary retention Outcome: Progressing   Problem: Pain Management: Goal: General experience of comfort will improve Outcome: Progressing   Problem: Safety: Goal: Ability to remain free from injury will improve Outcome: Progressing

## 2023-09-01 NOTE — Progress Notes (Addendum)
PHARMACY - ANTICOAGULATION CONSULT NOTE  Pharmacy Consult for Lovenox Indication: VTE prophylaxis  Allergies  Allergen Reactions   Amoxicillin Nausea Only    Has patient had a PCN reaction causing immediate rash, facial/tongue/throat swelling, SOB or lightheadedness with hypotension: "yes, i was dizzy" Has patient had a PCN reaction causing severe rash involving mucus membranes or skin necrosis: no Did a PCN reaction that required hospitalization : no, called the DR. Did PCN reaction occurring within the last 10 years: unk if all of the above answers are "NO", then may proceed with Cephalosporin use.    Canagliflozin Nausea Only and Other (See Comments)    Dizziness  Dizziness  Dizziness   Enalapril Maleate     Other reaction(s): angioedema   Liraglutide     Other reaction(s): Nausea and dizziness   Pioglitazone     Other reaction(s): macular edema   Tramadol Hcl     Other reaction(s): nausea, headache and dizziness    Patient Measurements: Height: 5\' 3"  (160 cm) Weight: (!) 139 kg (306 lb 7 oz) IBW/kg (Calculated) : 52.4  Vital Signs: Temp: 98.3 F (36.8 C) (12/28 0726) Temp Source: Oral (12/28 0726) BP: 128/65 (12/28 0726) Pulse Rate: 84 (12/28 0726)  Labs: Recent Labs    08/30/23 1719 08/31/23 0601 09/01/23 0452  HGB 11.6* 11.1* 9.6*  HCT 38.1 35.0* 30.8*  PLT 208 191 170  CREATININE 1.49* 1.70*  --     Estimated Creatinine Clearance: 33.2 mL/min (A) (by C-G formula based on SCr of 1.7 mg/dL (H)).   Medical History: Past Medical History:  Diagnosis Date   Anemia    Arthritis    Carotid arterial disease (HCC)    COVID-19    Diabetes mellitus    Hyperlipidemia    Hypertension    Migraine    Obesity    Reflux    Sleep apnea     Scheduled:   (feeding supplement) PROSource Plus  30 mL Oral BID BM   acetaminophen  650 mg Oral Q6H   allopurinol  100 mg Oral Daily   amLODipine  5 mg Oral Daily   docusate sodium  100 mg Oral BID   enoxaparin  (LOVENOX) injection  60 mg Subcutaneous Q12H   escitalopram  20 mg Oral Daily   ezetimibe  10 mg Oral Daily   hydrALAZINE  25 mg Oral TID   insulin glargine-yfgn  35 Units Subcutaneous Daily   isosorbide mononitrate  60 mg Oral Daily   metoprolol succinate  50 mg Oral Daily   multivitamin with minerals  1 tablet Oral Daily   [START ON 09/02/2023] pantoprazole  40 mg Oral Daily    Assessment: LACHANA SARSOUR is a 87 y.o. female with medical history significant for CAD, hypertension, hyperlipidemia, type 2 diabetes mellitus, GERD, chronic diastolic heart failure, CKD 3B associated baseline creatinine range 1.5-2.0, who presented to ED s/p mechanical fall while attempting to get into car. She underwent ORIF of distal femur yesterday,08/31/23. Pharmacy has been consulted for lovenox for VTE prophylaxis in setting of obesity (BMI >50) and CKD (CrCl 33.2). Hgb today 9.6, PLTs 170.  Goal of Therapy:  Anti-Xa level 0.6-1 units/ml 4hrs after LMWH dose given Monitor platelets by anticoagulation protocol: Yes   Plan:  Lovenox 60 mg SQ BID Monitor Xa level 4 hours after 3rd or 4th dose Monitor Hgb and PLTs, signs and symptoms of bleeding Recommend transition to DOAC when able and appropriate  Lora Paula, PharmD PGY-2 Infectious Diseases Pharmacy Resident  09/01/2023 12:43 PM

## 2023-09-01 NOTE — Progress Notes (Signed)
PROGRESS NOTE    Jeanne [name]  WUJ:[id number] DOB: 1935/09/16 DOA: 08/30/2023 PCP: Renford Dills, MD  Outpatient Specialists:     Brief Narrative:  Patient is an 87 year old female with past medical history significant for coronary artery disease, hyperlipidemia, type 2 diabetes mellitus, hypertension, GERD, chronic kidney disease stage IIIb with baseline serum creatinine of 1.5-2, OSA on CPAP, and chronic diastolic congestive heart failure.  Patient was admitted with right distal femur fracture following a mechanical fall.  Patient underwent open reduction and internal fixation of the right distal femoral fracture on 08/31/2027.  Orthopedic team is directing postop management.  09/01/2023: Patient seen alongside patient's daughter.  Pain is controlled.  Input from the orthopedic team is highly appreciated.  PT OT input is appreciated.  For skilled nursing facility placement.  Low normal blood pressure.  Will discontinue hydralazine.   Assessment & Plan:   Principal Problem:   Hip fracture (HCC) Active Problems:   Closed nondisplaced segmental fracture of shaft of right femur (HCC)   Right periprosthetic femur fracture  -Status post surgery. -Orthopedic team is directing postop care. -Pain is controlled. -For SNF.      Hx of Stable Angina  - last cardiac cath  4/24  noted nonobstructive disease no change from prior cath  - last echo  -will continue with cardiac medications once med rec has been reconcilled  09/01/2023: No chest pain.   CHF pef  - no acute exacerbation currently  09/01/2023: Compensated.   CKDIIIb-IV -at baseline renal function  repeat noted 1.7 -will hold lasix, and give gentle ivfs while npo  -follow lab values   OSA - cpap at bedtime    HTN -stable resume home regimen once med recompleted    HLD -resume statin as able    DMII - iss/fs , q4h fs while npo        DVT prophylaxis: Subcutaneous Lovenox Code Status: Full code. Family  Communication: Daughter. Disposition Plan: Skilled nursing facility.   Consultants:  Orthopedic surgery.  Procedures:  ORIF of distal right femur fracture  Antimicrobials:  None.   Subjective: No complaints. No chest pain. No shortness of breath. Pain is controlled.  Objective: Vitals:   09/01/23 0342 09/01/23 0423 09/01/23 0726 09/01/23 1524  BP: 121/62 134/65 128/65 (!) 82/70  Pulse: 94 90 84 75  Resp: (!) 21 17 15 16   Temp: 99 F (37.2 C) 98.7 F (37.1 C) 98.3 F (36.8 C) 98.1 F (36.7 C)  TempSrc: Oral Oral Oral Oral  SpO2: 98% 99% 94% 97%  Weight:      Height:        Intake/Output Summary (Last 24 hours) at 09/01/2023 1559 Last data filed at 09/01/2023 0730 Gross per 24 hour  Intake 540 ml  Output --  Net 540 ml   Filed Weights   08/30/23 1654  Weight: (!) 139 kg    Examination:  General exam: Appears calm and comfortable.  Patient is morbidly obese. Respiratory system: Clear to auscultation.  Cardiovascular system: S1 & S2 heard Gastrointestinal system: Abdomen is obese, soft and nontender.   Central nervous system: Awake and alert.  Patient moves all extremities.   Extremities: Right lower leg is wrapped.  Data Reviewed: I have personally reviewed following labs and imaging studies  CBC: Recent Labs  Lab 08/30/23 1719 08/31/23 0601 09/01/23 0452  WBC 6.3 8.2 10.0  NEUTROABS 4.0  --   --   HGB 11.6* 11.1* 9.6*  HCT 38.1 35.0* 30.8*  MCV  86.8 84.5 85.6  PLT 208 191 170   Basic Metabolic Panel: Recent Labs  Lab 08/30/23 1719 08/31/23 0601  NA 135 134*  K 4.3 4.5  CL 97* 95*  CO2 27 29  GLUCOSE 130* 213*  BUN 19 18  CREATININE 1.49* 1.70*  CALCIUM 9.1 9.0   GFR: Estimated Creatinine Clearance: 33.2 mL/min (A) (by C-G formula based on SCr of 1.7 mg/dL (H)). Liver Function Tests: No results for input(s): "AST", "ALT", "ALKPHOS", "BILITOT", "PROT", "ALBUMIN" in the last 168 hours. No results for input(s): "LIPASE", "AMYLASE"  in the last 168 hours. No results for input(s): "AMMONIA" in the last 168 hours. Coagulation Profile: No results for input(s): "INR", "PROTIME" in the last 168 hours. Cardiac Enzymes: No results for input(s): "CKTOTAL", "CKMB", "CKMBINDEX", "TROPONINI" in the last 168 hours. BNP (last 3 results) No results for input(s): "PROBNP" in the last 8760 hours. HbA1C: No results for input(s): "HGBA1C" in the last 72 hours. CBG: Recent Labs  Lab 08/31/23 2118 09/01/23 0029 09/01/23 0640 09/01/23 0728 09/01/23 1157  GLUCAP 184* 210* 193* 198* 198*   Lipid Profile: No results for input(s): "CHOL", "HDL", "LDLCALC", "TRIG", "CHOLHDL", "LDLDIRECT" in the last 72 hours. Thyroid Function Tests: No results for input(s): "TSH", "T4TOTAL", "FREET4", "T3FREE", "THYROIDAB" in the last 72 hours. Anemia Panel: No results for input(s): "VITAMINB12", "FOLATE", "FERRITIN", "TIBC", "IRON", "RETICCTPCT" in the last 72 hours. Urine analysis:    Component Value Date/Time   COLORURINE YELLOW 08/30/2020 2150   APPEARANCEUR HAZY (A) 08/30/2020 2150   LABSPEC 1.017 08/30/2020 2150   PHURINE 5.0 08/30/2020 2150   GLUCOSEU NEGATIVE 08/30/2020 2150   HGBUR NEGATIVE 08/30/2020 2150   BILIRUBINUR NEGATIVE 08/30/2020 2150   KETONESUR NEGATIVE 08/30/2020 2150   PROTEINUR NEGATIVE 08/30/2020 2150   UROBILINOGEN 0.2 09/11/2011 1525   NITRITE NEGATIVE 08/30/2020 2150   LEUKOCYTESUR NEGATIVE 08/30/2020 2150   Sepsis Labs: @LABRCNTIP (procalcitonin:4,lacticidven:4)  ) Recent Results (from the past 240 hours)  Surgical PCR screen     Status: Abnormal   Collection Time: 08/31/23  5:19 AM   Specimen: Nasal Mucosa; Nasal Swab  Result Value Ref Range Status   MRSA, PCR NEGATIVE NEGATIVE Final   Staphylococcus aureus POSITIVE (A) NEGATIVE Final    Comment: (NOTE) The Xpert SA Assay (FDA approved for NASAL specimens in patients 71 years of age and older), is one component of a comprehensive surveillance program.  It is not intended to diagnose infection nor to guide or monitor treatment. Performed at [facility], [address], [place], [place] [address]          Radiology Studies: DG FEMUR, MIN 2 VIEWS RIGHT Result Date: 08/31/2023 CLINICAL DATA:  ORIF distal femur fracture EXAM: RIGHT FEMUR 2 VIEWS COMPARISON:  08/30/2023 FINDINGS: Eight fluoroscopic images are obtained during the performance of the procedure and are provided for interpretation only. Lateral plate and screw fixation is seen traversing the comminuted periprosthetic distal femoral fracture, with near anatomic alignment. Right knee arthroplasty again identified. Please refer to operative report. Fluoroscopy time: 69.4 seconds, 8.79 mGy IMPRESSION: 1. ORIF comminuted distal femoral periprosthetic fracture. Electronically Signed   By: Sharlet Salina M.D.   On: 08/31/2023 15:03   DG Knee Right Port Result Date: 08/31/2023 CLINICAL DATA:  Postoperative images of right knee fracture. EXAM: PORTABLE RIGHT KNEE - 1-2 VIEW COMPARISON:  Intraoperative fluoroscopy right femur 08/31/2023, CT right knee 08/31/2023 FINDINGS: Interval lateral plate and screw fixation of the distal femur, spanning the previously seen markedly comminuted  distal femoral metadiaphyseal fracture. Improved alignment. Persistent fracture line lucencies and mild to moderate diastasis. Postsurgical changes of total right knee arthroplasty. No definite hardware complication is seen. IMPRESSION: Interval lateral plate and screw fixation of the distal femur, spanning the previously seen markedly comminuted distal femoral metadiaphyseal fracture. Improved alignment. Electronically Signed   By: Neita Garnet M.D.   On: 08/31/2023 12:58   DG C-Arm 1-60 Min-No Report Result Date: 08/31/2023 Fluoroscopy was utilized by the requesting physician.  No radiographic interpretation.   Chest Portable 1 View Result Date: 08/31/2023 CLINICAL DATA:  Fall, hip fracture EXAM: PORTABLE  CHEST 1 VIEW COMPARISON:  03/15/2023 FINDINGS: Cardiomegaly, vascular congestion. No confluent airspace opacities, effusions or edema. No acute bony abnormality. Aortic atherosclerosis. IMPRESSION: Cardiomegaly, vascular congestion. Electronically Signed   By: Charlett Nose M.D.   On: 08/31/2023 03:06   CT KNEE RIGHT WO CONTRAST Result Date: 08/31/2023 CLINICAL DATA:  Insert Street EXAM: CT OF THE RIGHT KNEE WITHOUT CONTRAST TECHNIQUE: Multidetector CT imaging of the right knee was performed according to the standard protocol. Multiplanar CT image reconstructions were also generated. RADIATION DOSE REDUCTION: This exam was performed according to the departmental dose-optimization program which includes automated exposure control, adjustment of the mA and/or kV according to patient size and/or use of iterative reconstruction technique. COMPARISON:  X-ray right knee 08/30/2023 FINDINGS: Bones/Joint/Cartilage Total knee arthroplasty. Acute displaced comminuted distal femoral shaft fracture. Joint effusion. Ligaments Suboptimally assessed by CT. Muscles and Tendons: Grossly unremarkable. Soft tissues Popliteal fossa subcutaneus soft tissue edema with limited evaluation due to streak artifact. IMPRESSION: Acute displaced and comminuted distal femoral shaft fracture just proximal to the total knee arthroplasty. Electronically Signed   By: Tish Frederickson M.D.   On: 08/31/2023 01:42   DG Femur 1V Right Result Date: 08/30/2023 CLINICAL DATA:  Status post fall. EXAM: RIGHT FEMUR 1 VIEW COMPARISON:  None Available. FINDINGS: There is acute fracture deformity involving the distal right femoral shaft. This extends to within 3 mm of an otherwise intact right knee replacement. There is no evidence of dislocation. Soft tissue swelling is seen surrounding the previously noted fracture site. IMPRESSION: Acute fracture of the distal right femoral shaft. Electronically Signed   By: Aram Candela M.D.   On: 08/30/2023 23:07    DG Knee 2 Views Right Result Date: 08/30/2023 CLINICAL DATA:  Status post fall. EXAM: RIGHT KNEE - 1-2 VIEW COMPARISON:  None Available. FINDINGS: An intact right knee replacement is seen. There is an acute fracture deformity involving the distal right femoral shaft. The distal fracture site is approximately 3 mm from the right femoral prosthesis of the previously noted knee replacement. There is no evidence of dislocation. A large suprapatellar effusion is noted. IMPRESSION: 1. Acute fracture of the distal right femoral shaft. 2. Intact right knee replacement. 3. Large suprapatellar effusion. Electronically Signed   By: Aram Candela M.D.   On: 08/30/2023 23:05   CT ANGIO LOWER EXT BILAT W &/OR WO CONTRAST Result Date: 08/30/2023 CLINICAL DATA:  Recent fall with right knee pain EXAM: CT ANGIOGRAPHY OF ABDOMINAL AORTA WITH ILIOFEMORAL RUNOFF TECHNIQUE: Multidetector CT imaging of the abdomen, pelvis and lower extremities was performed using the standard protocol during bolus administration of intravenous contrast. Multiplanar CT image reconstructions and MIPs were obtained to evaluate the vascular anatomy. RADIATION DOSE REDUCTION: This exam was performed according to the departmental dose-optimization program which includes automated exposure control, adjustment of the mA and/or kV according to patient size and/or use of iterative reconstruction  technique. CONTRAST:  80mL OMNIPAQUE IOHEXOL 350 MG/ML SOLN COMPARISON:  None Available. FINDINGS: VASCULAR Aorta: Abdominal aorta demonstrates atherosclerotic calcifications without aneurysmal dilatation or dissection. Celiac: Patent without evidence of aneurysm, dissection, vasculitis or significant stenosis. SMA: Patent without evidence of aneurysm, dissection, vasculitis or significant stenosis. Renals: Mild atherosclerotic changes are noted. Mild stenosis is noted proximally on the left. IMA: Patent without evidence of aneurysm, dissection, vasculitis or  significant stenosis. RIGHT Lower Extremity Inflow: Atherosclerotic calcifications are noted in the iliac artery. No focal stenosis is noted. Runoff: Common femoral and femoral bifurcation on the right are widely patent. The superficial femoral artery is also widely patent as is the popliteal artery. Popliteal trifurcation is patent with three-vessel runoff to the level of the right ankle. Anterior and posterior tibial arteries continue into the foot. LEFT Lower Extremity Inflow: Atherosclerotic calcifications of the iliac vessels are seen without focal stenosis. Runoff: Common femoral artery and femoral bifurcation are patent. Superficial femoral artery and popliteal artery are widely patent as well. Popliteal trifurcation is patent with three-vessel runoff to the left ankle. Veins: No specific venous abnormality is noted. Review of the MIP images confirms the above findings. NON-VASCULAR Lower chest: Lung bases are free of acute infiltrate or sizable effusion. Hepatobiliary: No focal liver abnormality is seen. Status post cholecystectomy. No biliary dilatation. Pancreas: Unremarkable. No pancreatic ductal dilatation or surrounding inflammatory changes. Spleen: Normal in size without focal abnormality. Adrenals/Urinary Tract: Adrenal glands are within normal limits. Kidneys demonstrate multiple renal cysts bilaterally some of which are hyperdense in nature. These appear stable in appearance from a prior MRI from 2021. Given their long-term stability no further follow-up is recommended. No obstructive changes are seen. The bladder is well distended. Stomach/Bowel: Scattered fecal material is noted throughout the colon. A knuckle of transverse colon extends into an umbilical fat containing hernia without complicating factors. The appendix is within normal limits. Small bowel and stomach are unremarkable. Lymphatic: No enlarged abdominal or pelvic lymph nodes. Reproductive: Calcified uterine fibroids are noted. No  adnexal mass is noted. Other: No free fluid is seen. Umbilical fat containing hernia is noted as previously described. Musculoskeletal: Degenerative changes of lumbar spine are noted. Right knee prosthesis is seen. Just above the knee prosthesis there is a comminuted fracture of the distal femoral metaphysis without significant displacement. No other acute bony abnormality is noted. No patellar dislocation is noted. IMPRESSION: VASCULAR Scattered atherosclerotic calcifications No significant vascular abnormality is noted to correspond with the given clinical history. NON-VASCULAR Comminuted distal right femoral fracture involving the metaphysis just above the knee prosthesis. Multiple renal cysts are noted bilaterally relatively stable from a prior MRI in 2021. No further follow-up is recommended. Fat containing umbilical hernia with a knuckle of transverse colon within. No obstructive changes are noted. Uterine fibroid change. Electronically Signed   By: Alcide Clever M.D.   On: 08/30/2023 22:10   CT Cervical Spine Wo Contrast Result Date: 08/30/2023 CLINICAL DATA:  Status post fall. EXAM: CT CERVICAL SPINE WITHOUT CONTRAST TECHNIQUE: Multidetector CT imaging of the cervical spine was performed without intravenous contrast. Multiplanar CT image reconstructions were also generated. RADIATION DOSE REDUCTION: This exam was performed according to the departmental dose-optimization program which includes automated exposure control, adjustment of the mA and/or kV according to patient size and/or use of iterative reconstruction technique. COMPARISON:  December 12, 2020 FINDINGS: Alignment: Normal. Skull base and vertebrae: No acute fracture. No primary bone lesion or focal pathologic process. Soft tissues and spinal canal: No prevertebral fluid  or swelling. No visible canal hematoma. Disc levels: Mild endplate sclerosis, moderate severity anterior osteophyte formation and moderate severity posterior bony spurring are seen  at the levels of C3-C4, C4-C5, C5-C6 and C6-C7. Moderate severity intervertebral disc space narrowing is seen at C5-C6 and C6-C7, with mild intervertebral disc space narrowing present throughout the remainder of the cervical spine. Bilateral marked severity multilevel facet joint hypertrophy is noted. Upper chest: Negative. Other: None. IMPRESSION: 1. No acute fracture or subluxation in the cervical spine. 2. Moderate severity multilevel degenerative changes, as described above. Electronically Signed   By: Aram Candela M.D.   On: 08/30/2023 19:53   CT Head Wo Contrast Result Date: 08/30/2023 CLINICAL DATA:  Status post fall. EXAM: CT HEAD WITHOUT CONTRAST TECHNIQUE: Contiguous axial images were obtained from the base of the skull through the vertex without intravenous contrast. RADIATION DOSE REDUCTION: This exam was performed according to the departmental dose-optimization program which includes automated exposure control, adjustment of the mA and/or kV according to patient size and/or use of iterative reconstruction technique. COMPARISON:  March 15, 2023 FINDINGS: Brain: There is mild cerebral atrophy with widening of the extra-axial spaces and ventricular dilatation. There are areas of decreased attenuation within the white matter tracts of the supratentorial brain, consistent with microvascular disease changes. Vascular: Marked severity bilateral cavernous carotid artery calcification is noted. Skull: Normal. Negative for fracture or focal lesion. Sinuses/Orbits: No acute finding. Other: It should be noted that the study is markedly limited secondary to patient motion. IMPRESSION: 1. Markedly limited study secondary to patient motion. 2. Generalized cerebral atrophy with chronic white matter small vessel ischemic changes. 3. No acute intracranial abnormality. Electronically Signed   By: Aram Candela M.D.   On: 08/30/2023 19:50        Scheduled Meds:  (feeding supplement) PROSource Plus  30  mL Oral BID BM   allopurinol  100 mg Oral Daily   amLODipine  5 mg Oral Daily   docusate sodium  100 mg Oral BID   enoxaparin (LOVENOX) injection  60 mg Subcutaneous Q12H   escitalopram  20 mg Oral Daily   ezetimibe  10 mg Oral Daily   insulin glargine-yfgn  35 Units Subcutaneous Daily   isosorbide mononitrate  60 mg Oral Daily   metoprolol succinate  50 mg Oral Daily   multivitamin with minerals  1 tablet Oral Daily   [START ON 09/02/2023] pantoprazole  40 mg Oral Daily   Continuous Infusions:   LOS: 1 day    Time spent: 35 minutes.    Berton Mount, MD  [facility] Pager #: [phone] 7PM-7AM contact night coverage as above

## 2023-09-01 NOTE — Progress Notes (Signed)
   09/01/23 2257  BiPAP/CPAP/SIPAP  Reason BIPAP/CPAP not in use Other(comment) (pt refused, stated daughter will bring home unit, can't wear ours.)  BiPAP/CPAP /SiPAP Vitals  SpO2 98 %

## 2023-09-01 NOTE — Evaluation (Signed)
Occupational Therapy Evaluation Patient Details Name: Jeanne [name] MRN: [id number] DOB: December 09, 1935 Today's Date: 09/01/2023   History of Present Illness 87 y.o. female presents to [facility] 08/30/23 w/ R hip pain after falling while attempting to get into the car w/ head strike. Pt w/ R periprosthetic femur fx s/p ORIF of R supracondylar distal femur fx 12/27. PMHx: HTN, CAD, hyperlipidemia, DMT2, GERD, HF, CKD 3B.   Clinical Impression   Patient admitted for the diagnosis above.  PTA she lives at home with family, walked in the home with a cane, 4WRW in the community, and completed her own ADL.  Pain to R leg is deficit, needing up to Max of 2 for basic mobility and Max A for lower body ADL at bedlevel.  Patient tries very hard, and is an excellent candidate for post acute rehab.  Patient will benefit from continued inpatient follow up therapy, <3 hours/day.  OT will follow in the acute setting.          If plan is discharge home, recommend the following: A lot of help with bathing/dressing/bathroom;Two people to help with walking and/or transfers    Functional Status Assessment  Patient has had a recent decline in their functional status and demonstrates the ability to make significant improvements in function in a reasonable and predictable amount of time.  Equipment Recommendations  None recommended by OT    Recommendations for Other Services       Precautions / Restrictions Precautions Precautions: Fall Restrictions Weight Bearing Restrictions Per Provider Order: Yes RLE Weight Bearing Per Provider Order: Weight bearing as tolerated      Mobility Bed Mobility Overal bed mobility: Needs Assistance Bed Mobility: Rolling, Supine to Sit, Sit to Supine Rolling: Max assist, Used rails   Supine to sit: Mod assist, Max assist, HOB elevated Sit to supine: Mod assist, +2 for physical assistance        Transfers                          Balance Overall balance  assessment: Needs assistance Sitting-balance support: Feet supported, Bilateral upper extremity supported Sitting balance-Leahy Scale: Fair                                     ADL either performed or assessed with clinical judgement   ADL Overall ADL's : Needs assistance/impaired Eating/Feeding: Set up;Bed level   Grooming: Wash/dry hands;Wash/dry face;Minimal assistance;Sitting   Upper Body Bathing: Minimal assistance;Sitting   Lower Body Bathing: Maximal assistance;Bed level   Upper Body Dressing : Minimal assistance;Sitting   Lower Body Dressing: Maximal assistance;Bed level       Toileting- Clothing Manipulation and Hygiene: Total assistance;Bed level               Vision Patient Visual Report: No change from baseline       Perception Perception: Not tested       Praxis Praxis: Not tested       Pertinent Vitals/Pain Pain Assessment Pain Assessment: Faces Faces Pain Scale: Hurts even more Pain Location: R LE w/ movement Pain Descriptors / Indicators: Grimacing, Guarding Pain Intervention(s): Monitored during session     Extremity/Trunk Assessment Upper Extremity Assessment Upper Extremity Assessment: Overall WFL for tasks assessed   Lower Extremity Assessment Lower Extremity Assessment: Defer to PT evaluation   Cervical / Trunk Assessment Cervical / Trunk Assessment: Other exceptions  Cervical / Trunk Exceptions: Body habitus   Communication Communication Communication: Difficulty communicating thoughts/reduced clarity of speech   Cognition Arousal: Alert Behavior During Therapy: Flat affect Overall Cognitive Status: Difficult to assess                                 General Comments: family member answered questions, pt oriented to name. Unable to answer questions at this time and moans with movement of R LE. Able to follow commands for LE activation and to roll     General Comments   VSS     Exercises      Shoulder Instructions      Home Living Family/patient expects to be discharged to:: Private residence Living Arrangements: Children Available Help at Discharge: Family;Available PRN/intermittently Type of Home: House Home Access: Level entry     Home Layout: One level     Bathroom Shower/Tub: Producer, television/film/video: Standard Bathroom Accessibility: Yes How Accessible: Accessible via walker Home Equipment: Grab bars - tub/shower;Cane - quad;Rollator (4 wheels)          Prior Functioning/Environment Prior Level of Function : Independent/Modified Independent;History of Falls (last six months)             Mobility Comments: per family, independent w/ quad cane for short distances and rollator for community. No falls prior to recent fall where pt lost balance while getting into the car ADLs Comments: independent        OT Problem List: Decreased strength;Decreased range of motion;Decreased activity tolerance;Impaired balance (sitting and/or standing);Pain;Decreased safety awareness;Decreased knowledge of use of DME or AE      OT Treatment/Interventions: Self-care/ADL training;Therapeutic activities;Patient/family education;Balance training;DME and/or AE instruction    OT Goals(Current goals can be found in the care plan section) Acute Rehab OT Goals Patient Stated Goal: Return home OT Goal Formulation: With patient Time For Goal Achievement: 09/17/23 Potential to Achieve Goals: Good ADL Goals Pt Will Perform Grooming: with set-up;sitting Pt Will Perform Upper Body Bathing: with supervision;sitting Pt Will Perform Upper Body Dressing: with supervision;sitting Pt Will Perform Lower Body Dressing: with mod assist;sitting/lateral leans;with adaptive equipment Pt Will Transfer to Toilet: with mod assist;with +2 assist;stand pivot transfer;bedside commode Pt/caregiver will Perform Home Exercise Program: Increased strength;Both right and left upper extremity;With  theraband;With Supervision  OT Frequency: Min 1X/week    Co-evaluation              AM-PAC OT "6 Clicks" Daily Activity     Outcome Measure Help from another person eating meals?: None Help from another person taking care of personal grooming?: A Little Help from another person toileting, which includes using toliet, bedpan, or urinal?: A Lot Help from another person bathing (including washing, rinsing, drying)?: A Lot Help from another person to put on and taking off regular upper body clothing?: A Little Help from another person to put on and taking off regular lower body clothing?: A Lot 6 Click Score: 16   End of Session Nurse Communication: Mobility status  Activity Tolerance: Patient tolerated treatment well Patient left: in bed;with call bell/phone within reach;with family/visitor present  OT Visit Diagnosis: Unsteadiness on feet (R26.81);Muscle weakness (generalized) (M62.81);History of falling (Z91.81);Pain Pain - Right/Left: Right Pain - part of body: Leg                Time: 1210-1240 OT Time Calculation (min): 30 min Charges:  OT General Charges $OT Visit:  1 Visit OT Evaluation $OT Eval Moderate Complexity: 1 Mod OT Treatments $Therapeutic Activity: 8-22 mins  09/01/2023  RP, OTR/L  Acute Rehabilitation Services  Office:  [phone]   Suzanna Obey 09/01/2023, 1:04 PM

## 2023-09-01 NOTE — Progress Notes (Signed)
Subjective: Patient reports pain as marked however seems a bit confused so not the best historian. Her daughter is in the room and says she thinks the pain medicine is making her ask abnormal. Patient having trouble remembering things. Tolerating diet.  Urinating.   No CP, SOB.  Has mobilized some OOB with PT yesterday but was limited d/t pain and drowsiness.  Objective:   VITALS:   Vitals:   08/31/23 1949 09/01/23 0342 09/01/23 0423 09/01/23 0726  BP: (!) 159/71 121/62 134/65 128/65  Pulse: 89 94 90 84  Resp: (!) 21 (!) 21 17 15   Temp: 99.8 F (37.7 C) 99 F (37.2 C) 98.7 F (37.1 C) 98.3 F (36.8 C)  TempSrc: Oral Oral Oral Oral  SpO2: 100% 98% 99% 94%  Weight:      Height:          Latest Ref Rng & Units 09/01/2023    4:52 AM 08/31/2023    6:01 AM 08/30/2023    5:19 PM  CBC  WBC 4.0 - 10.5 K/uL 10.0  8.2  6.3   Hemoglobin 12.0 - 15.0 g/dL 9.6  56.4  33.2   Hematocrit 36.0 - 46.0 % 30.8  35.0  38.1   Platelets 150 - 400 K/uL 170  191  208       Latest Ref Rng & Units 08/31/2023    6:01 AM 08/30/2023    5:19 PM 03/15/2023   11:08 AM  BMP  Glucose 70 - 99 mg/dL 951  884  98   BUN 8 - 23 mg/dL 18  19  27    Creatinine 0.44 - 1.00 mg/dL 1.66  0.63  0.16   Sodium 135 - 145 mmol/L 134  135  138   Potassium 3.5 - 5.1 mmol/L 4.5  4.3  4.1   Chloride 98 - 111 mmol/L 95  97  98   CO2 22 - 32 mmol/L 29  27  28    Calcium 8.9 - 10.3 mg/dL 9.0  9.1  9.2    Intake/Output      12/27 0701 12/28 0700 12/28 0701 12/29 0700   I.V. (mL/kg) 700 (5)    IV Piggyback 300    Total Intake(mL/kg) 1000 (7.2)    Blood 300    Total Output 300    Net +700            Physical Exam: General: NAD.  Laying in bed, calm, comfortable, somewhat confused Resp: No increased wob Cardio: regular rate and rhythm ABD soft Neurologically intact MSK Neurovascularly intact Sensation intact distally Intact pulses distally Dorsiflexion/Plantar flexion intact Incision: dressing  C/D/I   Assessment: 1 Day Post-Op  S/P Procedure(s) (LRB): OPEN REDUCTION INTERNAL FIXATION (ORIF) DISTAL FEMUR FRACTURE (Right) by Dr. Jena Gauss on 08/31/23  Principal Problem:   Hip fracture (HCC) Active Problems:   Closed nondisplaced segmental fracture of shaft of right femur (HCC)   Plan:  Advance diet Up with therapy Incentive Spirometry Elevate and Apply ice  Weightbearing: WBAT RLE Insicional and dressing care: Reinforce dressings as needed Orthopedic device(s): None Showering: Keep dressing dry VTE prophylaxis: Lovenox 40mg  qd  while inpatient but can switch to a DOAC upon d/c , SCDs, ambulation Pain control: PRN, minimize narcotics as able due to age and delirium risk Follow - up plan:  likely 10-14 days in office with Dr. Samson Frederic information for today:  Weber Cooks MD, Levester Fresh PA-C  Dispo:  PT recommending SNF.      Katrell Milhorn  Leveda Anna, PA-C Office [phone] 09/01/2023, 8:16 AM

## 2023-09-01 NOTE — Plan of Care (Signed)

## 2023-09-02 DIAGNOSIS — S72031S Displaced midcervical fracture of right femur, sequela: Secondary | ICD-10-CM

## 2023-09-02 DIAGNOSIS — S72031A Displaced midcervical fracture of right femur, initial encounter for closed fracture: Secondary | ICD-10-CM

## 2023-09-02 LAB — CBC WITH DIFFERENTIAL/PLATELET
Abs Immature Granulocytes: 0.05 10*3/uL (ref 0.00–0.07)
Basophils Absolute: 0 10*3/uL (ref 0.0–0.1)
Basophils Relative: 0 %
Eosinophils Absolute: 0.4 10*3/uL (ref 0.0–0.5)
Eosinophils Relative: 4 %
HCT: 28.7 % — ABNORMAL LOW (ref 36.0–46.0)
Hemoglobin: 8.9 g/dL — ABNORMAL LOW (ref 12.0–15.0)
Immature Granulocytes: 1 %
Lymphocytes Relative: 17 %
Lymphs Abs: 1.5 10*3/uL (ref 0.7–4.0)
MCH: 26.6 pg (ref 26.0–34.0)
MCHC: 31 g/dL (ref 30.0–36.0)
MCV: 85.9 fL (ref 80.0–100.0)
Monocytes Absolute: 1.3 10*3/uL — ABNORMAL HIGH (ref 0.1–1.0)
Monocytes Relative: 14 %
Neutro Abs: 5.8 10*3/uL (ref 1.7–7.7)
Neutrophils Relative %: 64 %
Platelets: 157 10*3/uL (ref 150–400)
RBC: 3.34 MIL/uL — ABNORMAL LOW (ref 3.87–5.11)
RDW: 13.9 % (ref 11.5–15.5)
WBC: 9.1 10*3/uL (ref 4.0–10.5)
nRBC: 0 % (ref 0.0–0.2)

## 2023-09-02 LAB — RENAL FUNCTION PANEL
Albumin: 2.5 g/dL — ABNORMAL LOW (ref 3.5–5.0)
Anion gap: 9 (ref 5–15)
BUN: 28 mg/dL — ABNORMAL HIGH (ref 8–23)
CO2: 27 mmol/L (ref 22–32)
Calcium: 8.5 mg/dL — ABNORMAL LOW (ref 8.9–10.3)
Chloride: 96 mmol/L — ABNORMAL LOW (ref 98–111)
Creatinine, Ser: 1.89 mg/dL — ABNORMAL HIGH (ref 0.44–1.00)
GFR, Estimated: 26 mL/min — ABNORMAL LOW (ref >60)
Glucose, Bld: 176 mg/dL — ABNORMAL HIGH (ref 70–99)
Phosphorus: 3.6 mg/dL (ref 2.5–4.6)
Potassium: 4.7 mmol/L (ref 3.5–5.1)
Sodium: 132 mmol/L — ABNORMAL LOW (ref 135–145)

## 2023-09-02 LAB — GLUCOSE, CAPILLARY
Glucose-Capillary: 162 mg/dL — ABNORMAL HIGH (ref 70–99)
Glucose-Capillary: 171 mg/dL — ABNORMAL HIGH (ref 70–99)
Glucose-Capillary: 202 mg/dL — ABNORMAL HIGH (ref 70–99)
Glucose-Capillary: 144 mg/dL — ABNORMAL HIGH (ref 70–99)
Glucose-Capillary: 175 mg/dL — ABNORMAL HIGH (ref 70–99)

## 2023-09-02 NOTE — Progress Notes (Signed)
PROGRESS NOTE    Jeanne [name]  ZOX:[id number] DOB: Oct 09, 1935 DOA: 08/30/2023 PCP: Renford Dills, MD  Outpatient Specialists:     Brief Narrative:  Patient is an 87 year old female with past medical history significant for coronary artery disease, hyperlipidemia, type 2 diabetes mellitus, hypertension, GERD, chronic kidney disease stage IIIb with baseline serum creatinine of 1.5-2, OSA on CPAP, and chronic diastolic congestive heart failure.  Patient was admitted with right distal femur fracture following a mechanical fall.  Patient underwent open reduction and internal fixation of the right distal femoral fracture on 08/31/2027.  Orthopedic team is directing postop management.  09/01/2023: Patient seen alongside patient's daughter.  Pain is controlled.  Input from the orthopedic team is highly appreciated.  PT OT input is appreciated.  For skilled nursing facility placement.  Low normal blood pressure.  Will discontinue hydralazine.  09/02/2023: Patient seen alongside 2 daughters.  Patient remained stable.  Pursue disposition.   Assessment & Plan:   Principal Problem:   Hip fracture (HCC) Active Problems:   Closed nondisplaced segmental fracture of shaft of right femur (HCC)   Right periprosthetic femur fracture  -Status post surgery. -Orthopedic team is directing postop care. -Pain is controlled. -For SNF.      Hx of Stable Angina  - last cardiac cath  4/24  noted nonobstructive disease no change from prior cath  - last echo  -will continue with cardiac medications once med rec has been reconcilled  09/02/2023: No chest pain.   CHF pef  - no acute exacerbation currently  09/02/2023: Compensated.   CKDIIIb-IV -at baseline renal function  repeat noted 1.7 -will hold lasix, and give gentle ivfs while npo  -follow lab values   OSA - cpap at bedtime    HTN -stable resume home regimen once med recompleted    HLD -resume statin as able    DMII - iss/fs , q4h  fs while npo        DVT prophylaxis: Subcutaneous Lovenox Code Status: Full code. Family Communication: Daughter. Disposition Plan: Skilled nursing facility.   Consultants:  Orthopedic surgery.  Procedures:  ORIF of distal right femur fracture  Antimicrobials:  None.   Subjective: No complaints. No chest pain. No shortness of breath. Pain is controlled.  Objective: Vitals:   09/01/23 1947 09/01/23 2257 09/02/23 0648 09/02/23 0729  BP: (!) 114/51  (!) 127/50 135/60  Pulse: 78  77 80  Resp: 15  17 16   Temp: 98 F (36.7 C)  98.1 F (36.7 C) 98.9 F (37.2 C)  TempSrc: Oral   Oral  SpO2: 98% 98% 96% 98%  Weight:      Height:        Intake/Output Summary (Last 24 hours) at 09/02/2023 1224 Last data filed at 09/02/2023 0548 Gross per 24 hour  Intake 480 ml  Output 1000 ml  Net -520 ml   Filed Weights   08/30/23 1654  Weight: (!) 139 kg    Examination:  General exam: Appears calm and comfortable.  Patient is morbidly obese. Respiratory system: Clear to auscultation.  Cardiovascular system: S1 & S2 heard Gastrointestinal system: Abdomen is obese, soft and nontender.   Central nervous system: Awake and alert.  Patient moves all extremities.   Extremities: Right lower leg is wrapped.  Data Reviewed: I have personally reviewed following labs and imaging studies  CBC: Recent Labs  Lab 08/30/23 1719 08/31/23 0601 09/01/23 0452 09/02/23 0441  WBC 6.3 8.2 10.0 9.1  NEUTROABS 4.0  --   --  5.8  HGB 11.6* 11.1* 9.6* 8.9*  HCT 38.1 35.0* 30.8* 28.7*  MCV 86.8 84.5 85.6 85.9  PLT 208 191 170 157   Basic Metabolic Panel: Recent Labs  Lab 08/30/23 1719 08/31/23 0601 09/01/23 1630 09/02/23 0441  NA 135 134* 132* 132*  K 4.3 4.5 4.7 4.7  CL 97* 95* 94* 96*  CO2 27 29 31 27   GLUCOSE 130* 213* 202* 176*  BUN 19 18 24* 28*  CREATININE 1.49* 1.70* 1.91* 1.89*  CALCIUM 9.1 9.0 8.5* 8.5*  PHOS  --   --  3.3 3.6   GFR: Estimated Creatinine Clearance:  29.9 mL/min (A) (by C-G formula based on SCr of 1.89 mg/dL (H)). Liver Function Tests: Recent Labs  Lab 09/01/23 1630 09/02/23 0441  ALBUMIN 2.4* 2.5*   No results for input(s): "LIPASE", "AMYLASE" in the last 168 hours. No results for input(s): "AMMONIA" in the last 168 hours. Coagulation Profile: No results for input(s): "INR", "PROTIME" in the last 168 hours. Cardiac Enzymes: No results for input(s): "CKTOTAL", "CKMB", "CKMBINDEX", "TROPONINI" in the last 168 hours. BNP (last 3 results) No results for input(s): "PROBNP" in the last 8760 hours. HbA1C: No results for input(s): "HGBA1C" in the last 72 hours. CBG: Recent Labs  Lab 09/01/23 1707 09/01/23 2138 09/02/23 0646 09/02/23 0723 09/02/23 1117  GLUCAP 202* 170* 162* 171* 202*   Lipid Profile: No results for input(s): "CHOL", "HDL", "LDLCALC", "TRIG", "CHOLHDL", "LDLDIRECT" in the last 72 hours. Thyroid Function Tests: No results for input(s): "TSH", "T4TOTAL", "FREET4", "T3FREE", "THYROIDAB" in the last 72 hours. Anemia Panel: No results for input(s): "VITAMINB12", "FOLATE", "FERRITIN", "TIBC", "IRON", "RETICCTPCT" in the last 72 hours. Urine analysis:    Component Value Date/Time   COLORURINE YELLOW 08/30/2020 2150   APPEARANCEUR HAZY (A) 08/30/2020 2150   LABSPEC 1.017 08/30/2020 2150   PHURINE 5.0 08/30/2020 2150   GLUCOSEU NEGATIVE 08/30/2020 2150   HGBUR NEGATIVE 08/30/2020 2150   BILIRUBINUR NEGATIVE 08/30/2020 2150   KETONESUR NEGATIVE 08/30/2020 2150   PROTEINUR NEGATIVE 08/30/2020 2150   UROBILINOGEN 0.2 09/11/2011 1525   NITRITE NEGATIVE 08/30/2020 2150   LEUKOCYTESUR NEGATIVE 08/30/2020 2150   Sepsis Labs: @LABRCNTIP (procalcitonin:4,lacticidven:4)  ) Recent Results (from the past 240 hours)  Surgical PCR screen     Status: Abnormal   Collection Time: 08/31/23  5:19 AM   Specimen: Nasal Mucosa; Nasal Swab  Result Value Ref Range Status   MRSA, PCR NEGATIVE NEGATIVE Final   Staphylococcus  aureus POSITIVE (A) NEGATIVE Final    Comment: (NOTE) The Xpert SA Assay (FDA approved for NASAL specimens in patients 28 years of age and older), is one component of a comprehensive surveillance program. It is not intended to diagnose infection nor to guide or monitor treatment. Performed at [facility], [address] [place], [place] [address]          Radiology Studies: DG Knee Right Port Result Date: 08/31/2023 CLINICAL DATA:  Postoperative images of right knee fracture. EXAM: PORTABLE RIGHT KNEE - 1-2 VIEW COMPARISON:  Intraoperative fluoroscopy right femur 08/31/2023, CT right knee 08/31/2023 FINDINGS: Interval lateral plate and screw fixation of the distal femur, spanning the previously seen markedly comminuted distal femoral metadiaphyseal fracture. Improved alignment. Persistent fracture line lucencies and mild to moderate diastasis. Postsurgical changes of total right knee arthroplasty. No definite hardware complication is seen. IMPRESSION: Interval lateral plate and screw fixation of the distal femur, spanning the previously seen markedly comminuted distal femoral metadiaphyseal fracture. Improved alignment. Electronically Signed  By: Neita Garnet M.D.   On: 08/31/2023 12:58        Scheduled Meds:  (feeding supplement) PROSource Plus  30 mL Oral BID BM   allopurinol  100 mg Oral Daily   amLODipine  5 mg Oral Daily   docusate sodium  100 mg Oral BID   enoxaparin (LOVENOX) injection  60 mg Subcutaneous Q12H   escitalopram  20 mg Oral Daily   ezetimibe  10 mg Oral Daily   insulin glargine-yfgn  35 Units Subcutaneous Daily   isosorbide mononitrate  60 mg Oral Daily   metoprolol succinate  50 mg Oral Daily   multivitamin with minerals  1 tablet Oral Daily   pantoprazole  40 mg Oral Daily   Continuous Infusions:   LOS: 2 days    Time spent: 35 minutes.    Berton Mount, MD  [facility] Pager #: [phone] 7PM-7AM contact night coverage as  above

## 2023-09-02 NOTE — Progress Notes (Signed)
° ° ° °  2 Days Post-Op Procedure(s) (LRB): OPEN REDUCTION INTERNAL FIXATION (ORIF) DISTAL FEMUR FRACTURE (Right) Subjective:  Patient reports pain as mild.  Resting in bed comfortably.  Slept okay.  FM at bedside, reports patient more groggy yesterday and being more cautious with narcotic pain management, some improvement today.  Denies numbness or tingling.  Some mobility with PT/OT, hopeful for improvement today.  No other complaints at this time.  Objective:   VITALS:   Vitals:   09/01/23 1947 09/01/23 2257 09/02/23 0648 09/02/23 0729  BP: (!) 114/51  (!) 127/50 135/60  Pulse: 78  77 80  Resp: 15  17 16   Temp: 98 F (36.7 C)  98.1 F (36.7 C) 98.9 F (37.2 C)  TempSrc: Oral   Oral  SpO2: 98% 98% 96% 98%  Weight:      Height:        Alert, resting comfortably, answers questions appropriately, in NAD  Neurovascular intact Sensation intact distally Intact pulses distally Dorsiflexion/Plantar flexion intact Incision: dressing C/D/I distally.  Proximal mepilex absent, sutures intact without erythema or drainage. Compartment soft Wiggles toes appropriately   Lab Results  Component Value Date   WBC 9.1 09/02/2023   HGB 8.9 (L) 09/02/2023   HCT 28.7 (L) 09/02/2023   MCV 85.9 09/02/2023   PLT 157 09/02/2023   BMET    Component Value Date/Time   NA 132 (L) 09/02/2023 0441   K 4.7 09/02/2023 0441   CL 96 (L) 09/02/2023 0441   CO2 27 09/02/2023 0441   GLUCOSE 176 (H) 09/02/2023 0441   BUN 28 (H) 09/02/2023 0441   CREATININE 1.89 (H) 09/02/2023 0441   CREATININE 1.63 (H) 01/27/2020 1044   CALCIUM 8.5 (L) 09/02/2023 0441   GFRNONAA 26 (L) 09/02/2023 0441   GFRNONAA 29 (L) 01/27/2020 1044     Xray: Stable post-operative imaging  Assessment/Plan: 2 Days Post-Op   Principal Problem:   Hip fracture (HCC) Active Problems:   Closed nondisplaced segmental fracture of shaft of right femur (HCC)   Procedure(s) (LRB): OPEN REDUCTION INTERNAL FIXATION (ORIF) DISTAL  FEMUR FRACTURE (Right)   Narrative: 09/02/23:  Hgb 8.9, likely post-operative anemia.  New proximal mepilex placed per nursing staff d/t accidental removal with blanket change yesterday per FM.  Continue limiting narcotics when possible.  Answered post-operative questions with FM at bedside.  Continue mobilizing with PT/OT.  Likely DC to SNF.   Plan:  Weightbearing: WBAT RLE ROM/Precautions:  None Mobility: Continue with PT/OT Insicional and dressing care: Reinforce dressings PRN Pain management: multimodal, limit narcotics due to age and delirium risk Antibiotics: Periop abx Diet: Advance as tolerated VTE prophylaxis: Lovenox in-house, DOAC outpatient; SCDs, ambulation Dispo: per medicine, SNF Follow - up plan: 10-14 days in office with Dr. Catha Gosselin Creed Copper 09/02/2023, 7:30 AM   Contact information:   [email or web address] 7am-5pm epic message Dr. Blanchie Dessert, or call office for patient follow up: [phone] After hours and holidays please check [email or web address] for group call information for [facility]

## 2023-09-02 NOTE — Progress Notes (Addendum)
PHARMACY - ANTICOAGULATION CONSULT NOTE  Pharmacy Consult for Lovenox Indication: VTE prophylaxis  Allergies  Allergen Reactions   Amoxicillin Nausea Only    Has patient had a PCN reaction causing immediate rash, facial/tongue/throat swelling, SOB or lightheadedness with hypotension: "yes, i was dizzy" Has patient had a PCN reaction causing severe rash involving mucus membranes or skin necrosis: no Did a PCN reaction that required hospitalization : no, called the DR. Did PCN reaction occurring within the last 10 years: unk if all of the above answers are "NO", then may proceed with Cephalosporin use.    Canagliflozin Nausea Only and Other (See Comments)    Dizziness  Dizziness  Dizziness   Enalapril Maleate     Other reaction(s): angioedema   Liraglutide     Other reaction(s): Nausea and dizziness   Pioglitazone     Other reaction(s): macular edema   Tramadol Hcl     Other reaction(s): nausea, headache and dizziness    Patient Measurements: Height: 5\' 3"  (160 cm) Weight: (!) 139 kg (306 lb 7 oz) IBW/kg (Calculated) : 52.4  Vital Signs: Temp: 98.9 F (37.2 C) (12/29 0729) Temp Source: Oral (12/29 0729) BP: 135/60 (12/29 0729) Pulse Rate: 80 (12/29 0729)  Labs: Recent Labs    08/31/23 0601 09/01/23 0452 09/01/23 1630 09/02/23 0441  HGB 11.1* 9.6*  --  8.9*  HCT 35.0* 30.8*  --  28.7*  PLT 191 170  --  157  CREATININE 1.70*  --  1.91* 1.89*    Estimated Creatinine Clearance: 29.9 mL/min (A) (by C-G formula based on SCr of 1.89 mg/dL (H)).   Medical History: Past Medical History:  Diagnosis Date   Anemia    Arthritis    Carotid arterial disease (HCC)    COVID-19    Diabetes mellitus    Hyperlipidemia    Hypertension    Migraine    Obesity    Reflux    Sleep apnea     Scheduled:   (feeding supplement) PROSource Plus  30 mL Oral BID BM   allopurinol  100 mg Oral Daily   amLODipine  5 mg Oral Daily   docusate sodium  100 mg Oral BID   enoxaparin  (LOVENOX) injection  60 mg Subcutaneous Q12H   escitalopram  20 mg Oral Daily   ezetimibe  10 mg Oral Daily   insulin glargine-yfgn  35 Units Subcutaneous Daily   isosorbide mononitrate  60 mg Oral Daily   metoprolol succinate  50 mg Oral Daily   multivitamin with minerals  1 tablet Oral Daily   pantoprazole  40 mg Oral Daily    Assessment: Jeanne [name] is a 87 y.o. female with medical history significant for CAD, hypertension, hyperlipidemia, type 2 diabetes mellitus, GERD, chronic diastolic heart failure, CKD 3B associated baseline creatinine range 1.5-2.0, who presented to ED s/p mechanical fall while attempting to get into car. She underwent ORIF of distal femur yesterday,08/31/23. Pharmacy has been consulted for lovenox for VTE prophylaxis in setting of obesity (BMI >50) and CKD (CrCl 33.2).   CBC today with Hgb 8.9, PLT 157. Will continue to monitor, with plan for Xa level tomorrow morning if continuing on Lovenox. Note CrCl today 29.9.  Goal of Therapy:  Anti-Xa level 0.2-0.4 u/mL 4hrs after LMWH dose given Monitor platelets by anticoagulation protocol: Yes   Plan:  Lovenox 60 mg SQ BID Plan for Xa level after morning dose tomorrow Monitor Hgb and PLTs, signs and symptoms of bleeding  Lora Paula,  PharmD PGY-2 Infectious Diseases Pharmacy Resident 09/02/2023 9:04 AM

## 2023-09-02 NOTE — Plan of Care (Signed)
  Problem: Education: Goal: Knowledge of General Education information will improve Description Including pain rating scale, medication(s)/side effects and non-pharmacologic comfort measures Outcome: Progressing   

## 2023-09-02 NOTE — Progress Notes (Signed)
Transition of Care [facility]) - CAGE-AID Screening   Patient Details  Name: Jeanne [name] MRN: [id number] Date of Birth: 05/21/1936  Transition of Care [facility] CM/SW Contact:    Leota Sauers, RN Phone Number: 09/02/2023, 2:36 AM   Clinical Narrative:  Patient denies the use of alcohol and illicit substance use. Resources not given at this time.  CAGE-AID Screening:    Have You Ever Felt You Ought to Cut Down on Your Drinking or Drug Use?: No Have People Annoyed You By Critizing Your Drinking Or Drug Use?: No Have You Felt Bad Or Guilty About Your Drinking Or Drug Use?: No Have You Ever Had a Drink or Used Drugs First Thing In The Morning to Steady Your Nerves or to Get Rid of a Hangover?: No CAGE-AID Score: 0  Substance Abuse Education Offered: No

## 2023-09-03 DIAGNOSIS — S72001A Fracture of unspecified part of neck of right femur, initial encounter for closed fracture: Secondary | ICD-10-CM | POA: Diagnosis not present

## 2023-09-03 LAB — HEPARIN ANTI-XA: Heparin LMW: 0.53 [IU]/mL

## 2023-09-03 LAB — GLUCOSE, CAPILLARY
Glucose-Capillary: 117 mg/dL — ABNORMAL HIGH (ref 70–99)
Glucose-Capillary: 197 mg/dL — ABNORMAL HIGH (ref 70–99)
Glucose-Capillary: 179 mg/dL — ABNORMAL HIGH (ref 70–99)
Glucose-Capillary: 152 mg/dL — ABNORMAL HIGH (ref 70–99)

## 2023-09-03 MED ORDER — OXYCODONE HCL 5 MG PO TABS: 2.5000 mg | ORAL_TABLET | ORAL | 0 refills | Status: AC | PRN

## 2023-09-03 MED ORDER — LATANOPROST 0.005 % OP SOLN
1.0000 [drp] | Freq: Every day | OPHTHALMIC | Status: DC
  Administered 2023-09-03: 1 [drp] via OPHTHALMIC
  Filled 2023-09-03: qty 2.5

## 2023-09-03 MED ORDER — METOPROLOL SUCCINATE ER 50 MG PO TB24
50.0000 mg | ORAL_TABLET | Freq: Once | ORAL | Status: AC
  Administered 2023-09-03: 50 mg via ORAL
  Filled 2023-09-03: qty 1

## 2023-09-03 MED ORDER — ENOXAPARIN SODIUM 60 MG/0.6ML IJ SOSY
60.0000 mg | PREFILLED_SYRINGE | INTRAMUSCULAR | Status: DC
  Administered 2023-09-04: 60 mg via SUBCUTANEOUS
  Filled 2023-09-03: qty 0.6

## 2023-09-03 MED ORDER — APIXABAN 2.5 MG PO TABS: 2.5000 mg | ORAL_TABLET | Freq: Two times a day (BID) | ORAL | 0 refills | Status: DC

## 2023-09-03 MED ORDER — METOPROLOL SUCCINATE ER 50 MG PO TB24
100.0000 mg | ORAL_TABLET | Freq: Every day | ORAL | Status: DC
  Administered 2023-09-04: 100 mg via ORAL
  Filled 2023-09-03: qty 2

## 2023-09-03 MED ORDER — PRAVASTATIN SODIUM 40 MG PO TABS
40.0000 mg | ORAL_TABLET | Freq: Every day | ORAL | Status: DC
  Administered 2023-09-03 – 2023-09-04 (×2): 40 mg via ORAL
  Filled 2023-09-03 (×2): qty 1

## 2023-09-03 NOTE — Progress Notes (Signed)
Pt's daughter expressed some concern about pt's mentation since surgery, pt has been somewhat confused and making statements that are concerning for family. Per family we will try to avoid any narcotic medications and/or any med that may further confuse pt. Pt at this time is resting comfortably in bed, eyes closed, she denied pain, no signs of distress, call bell in reach, family at bedside, will monitor closely

## 2023-09-03 NOTE — Progress Notes (Signed)
Physical Therapy Treatment Patient Details Name: Jeanne [name] MRN: [id number] DOB: 1935-10-06 Today's Date: 09/03/2023   History of Present Illness 87 y.o. female presents to [facility] 08/30/23 w/ R hip pain after falling while attempting to get into the car w/ head strike. Pt w/ R periprosthetic femur fx s/p ORIF of R supracondylar distal femur fx 12/27. PMHx: HTN, CAD, hyperlipidemia, DMT2, GERD, HF, CKD 3B.    PT Comments  Pt is limited by pain during session and is apprehensive about attempting transfers on RLE as she fear reinjury. PT provides extensive education on the pt being WBAT at this time. Pt requires physical assistance for all functional mobility tasks and continues to demonstrate significant weakness in RLE at this time. PT provides education on LE exercise to aide in improving muscle activation. Pt is made aware of the need to request PRN pain medications as she has not had any since 09/01/2023. Patient will benefit from continued inpatient follow up therapy, <3 hours/day.   If plan is discharge home, recommend the following: Assistance with cooking/housework;Assist for transportation;Help with stairs or ramp for entrance;Two people to help with walking and/or transfers;Two people to help with bathing/dressing/bathroom   Can travel by private vehicle     No  Equipment Recommendations  Hospital bed;Hoyer lift;Wheelchair (measurements PT)    Recommendations for Other Services       Precautions / Restrictions Precautions Precautions: Fall Restrictions Weight Bearing Restrictions Per Provider Order: Yes RLE Weight Bearing Per Provider Order: Weight bearing as tolerated     Mobility  Bed Mobility Overal bed mobility: Needs Assistance Bed Mobility: Rolling, Supine to Sit, Sit to Supine Rolling: Max assist   Supine to sit: HOB elevated, Mod assist Sit to supine: Mod assist   General bed mobility comments: significant increase in time, assist for RLE mobility and to  elevate trunk    Transfers Overall transfer level: Needs assistance   Transfers:  (attempted to initiate sit to stand x 2 however pt is apprehensive about bearing weight through RLE and is unable to clear buttocks from bed)                  Ambulation/Gait                   Stairs             Wheelchair Mobility     Tilt Bed    Modified Rankin (Stroke Patients Only)       Balance Overall balance assessment: Needs assistance Sitting-balance support: No upper extremity supported, Feet supported Sitting balance-Leahy Scale: Fair                                      Cognition Arousal: Alert Behavior During Therapy: Anxious Overall Cognitive Status: Impaired/Different from baseline Area of Impairment: Orientation, Memory, Safety/judgement, Awareness, Problem solving                 Orientation Level: Disoriented to, Time   Memory: Decreased short-term memory, Decreased recall of precautions     Awareness: Emergent Problem Solving: Slow processing, Requires verbal cues          Exercises General Exercises - Lower Extremity Ankle Circles/Pumps: AROM, Both, 10 reps Quad Sets: AROM, Right, 5 reps Hip ABduction/ADduction:  (PT provdided education on exercise, no reps performed)    General Comments General comments (skin integrity, edema, etc.): VSS on RA  Pertinent Vitals/Pain Pain Assessment Pain Assessment: Faces Faces Pain Scale: Hurts even more Pain Location: RLE Pain Descriptors / Indicators: Aching Pain Intervention(s): Monitored during session    Home Living                          Prior Function            PT Goals (current goals can now be found in the care plan section) Acute Rehab PT Goals Patient Stated Goal: to begin moving better Progress towards PT goals: Progressing toward goals (slowly)    Frequency    Min 1X/week      PT Plan      Co-evaluation               AM-PAC PT "6 Clicks" Mobility   Outcome Measure  Help needed turning from your back to your side while in a flat bed without using bedrails?: A Lot Help needed moving from lying on your back to sitting on the side of a flat bed without using bedrails?: Total Help needed moving to and from a bed to a chair (including a wheelchair)?: Total Help needed standing up from a chair using your arms (e.g., wheelchair or bedside chair)?: Total Help needed to walk in hospital room?: Total Help needed climbing 3-5 steps with a railing? : Total 6 Click Score: 7    End of Session   Activity Tolerance: Patient limited by pain Patient left: in bed;with call bell/phone within reach;with bed alarm set Nurse Communication: Mobility status;Need for lift equipment PT Visit Diagnosis: History of falling (Z91.81);Muscle weakness (generalized) (M62.81);Unsteadiness on feet (R26.81)     Time: 1610-9604 PT Time Calculation (min) (ACUTE ONLY): 45 min  Charges:    $Therapeutic Exercise: 8-22 mins $Therapeutic Activity: 23-37 mins PT General Charges $$ ACUTE PT VISIT: 1 Visit                     Arlyss Gandy, PT, DPT Acute Rehabilitation Office [phone]    Arlyss Gandy 09/03/2023, 4:42 PM

## 2023-09-03 NOTE — Care Management Important Message (Signed)
Important Message  Patient Details  Name: Jeanne [name] MRN: [id number] Date of Birth: [birth year]   Important Message Given:  Yes - Medicare IM     Dorena Bodo 09/03/2023, 3:58 PM

## 2023-09-03 NOTE — Progress Notes (Signed)
PROGRESS NOTE    Jeanne [name]  WUJ:[id number] DOB: 12/02/1935 DOA: 08/30/2023 PCP: Renford Dills, MD  Outpatient Specialists:     Brief Narrative:  Patient is an 87 year old female with past medical history significant for coronary artery disease, hyperlipidemia, type 2 diabetes mellitus, hypertension, GERD, chronic kidney disease stage IIIb with baseline serum creatinine of 1.5-2, OSA on CPAP, and chronic diastolic congestive heart failure.  Patient was admitted with right distal femur fracture following a mechanical fall.  Patient underwent open reduction and internal fixation of the right distal femoral fracture on 08/31/2027.  Orthopedic team is directing postop management.  09/01/2023: Patient seen alongside patient's daughter.  Pain is controlled.  Input from the orthopedic team is highly appreciated.  PT OT input is appreciated.  For skilled nursing facility placement.  Low normal blood pressure.  Will discontinue hydralazine.  09/02/2023: Patient seen alongside 2 daughters.  Patient remained stable.  Pursue disposition.  09/03/2023: Patient seen.  No new complaints.  Patient stable for discharge.  Will optimize blood pressure control.   Assessment & Plan:   Principal Problem:   Hip fracture (HCC) Active Problems:   Closed nondisplaced segmental fracture of shaft of right femur (HCC)   Right periprosthetic femur fracture: -Status post surgery. -Orthopedic team is directing postop care. -Pain is controlled. -For SNF.    09/03/2023: Patient is stable for discharge.   History of Stable Angina: - last cardiac cath  4/24  noted nonobstructive disease no change from prior cath  - last echo  -will continue with cardiac medications once med rec has been reconcilled  09/03/2023: No chest pain.   Chronic congestive heart failure with preserved ejection fraction: - no acute exacerbation currently  09/03/2023: Compensated.   CKDIIIb-IV: -Stable. -Repeat renal panel in  the morning.   OSA: - cpap at bedtime    Hypertension: -Will continue to optimize. -Will increase metoprolol XL from 50 Mg p.o. once daily to 100 Mg p.o. once daily. -Continue to monitor blood pressure closely. -Goal blood pressure should be less than 130/80 mmHg. -Optimize pain control.       Hyperlipidemia: -Continue Pravachol.      Type 2 diabetes mellitus: -Continue to monitor closely.       DVT prophylaxis: Subcutaneous Lovenox Code Status: Full code. Family Communication: Daughter. Disposition Plan: Skilled nursing facility.   Consultants:  Orthopedic surgery.  Procedures:  ORIF of distal right femur fracture  Antimicrobials:  None.   Subjective: No complaints. No chest pain. No shortness of breath. Pain is controlled.  Objective: Vitals:   09/02/23 1450 09/02/23 2130 09/03/23 0519 09/03/23 1108  BP: 130/61 (!) 159/70 (!) 149/63 (!) 167/72  Pulse: 75 77 77 80  Resp: 18 18 16 16   Temp: 98.1 F (36.7 C) 98.5 F (36.9 C) 98.9 F (37.2 C) 97.9 F (36.6 C)  TempSrc: Oral Oral Oral Oral  SpO2: 100% 100% 99% 100%  Weight:      Height:        Intake/Output Summary (Last 24 hours) at 09/03/2023 1704 Last data filed at 09/03/2023 0518 Gross per 24 hour  Intake --  Output 1000 ml  Net -1000 ml   Filed Weights   08/30/23 1654  Weight: (!) 139 kg    Examination:  General exam: Appears calm and comfortable.  Patient is morbidly obese. Respiratory system: Clear to auscultation.  Cardiovascular system: S1 & S2 heard Gastrointestinal system: Abdomen is obese, soft and nontender.   Central nervous system: Awake and alert.  Patient moves all extremities.   Extremities: Right lower leg is wrapped.  Data Reviewed: I have personally reviewed following labs and imaging studies  CBC: Recent Labs  Lab 08/30/23 1719 08/31/23 0601 09/01/23 0452 09/02/23 0441  WBC 6.3 8.2 10.0 9.1  NEUTROABS 4.0  --   --  5.8  HGB 11.6* 11.1* 9.6* 8.9*  HCT 38.1  35.0* 30.8* 28.7*  MCV 86.8 84.5 85.6 85.9  PLT 208 191 170 157   Basic Metabolic Panel: Recent Labs  Lab 08/30/23 1719 08/31/23 0601 09/01/23 1630 09/02/23 0441  NA 135 134* 132* 132*  K 4.3 4.5 4.7 4.7  CL 97* 95* 94* 96*  CO2 27 29 31 27   GLUCOSE 130* 213* 202* 176*  BUN 19 18 24* 28*  CREATININE 1.49* 1.70* 1.91* 1.89*  CALCIUM 9.1 9.0 8.5* 8.5*  PHOS  --   --  3.3 3.6   GFR: Estimated Creatinine Clearance: 29.9 mL/min (A) (by C-G formula based on SCr of 1.89 mg/dL (H)). Liver Function Tests: Recent Labs  Lab 09/01/23 1630 09/02/23 0441  ALBUMIN 2.4* 2.5*   No results for input(s): "LIPASE", "AMYLASE" in the last 168 hours. No results for input(s): "AMMONIA" in the last 168 hours. Coagulation Profile: No results for input(s): "INR", "PROTIME" in the last 168 hours. Cardiac Enzymes: No results for input(s): "CKTOTAL", "CKMB", "CKMBINDEX", "TROPONINI" in the last 168 hours. BNP (last 3 results) No results for input(s): "PROBNP" in the last 8760 hours. HbA1C: No results for input(s): "HGBA1C" in the last 72 hours. CBG: Recent Labs  Lab 09/02/23 1117 09/02/23 1618 09/02/23 2146 09/03/23 0649 09/03/23 1109  GLUCAP 202* 144* 175* 117* 197*   Lipid Profile: No results for input(s): "CHOL", "HDL", "LDLCALC", "TRIG", "CHOLHDL", "LDLDIRECT" in the last 72 hours. Thyroid Function Tests: No results for input(s): "TSH", "T4TOTAL", "FREET4", "T3FREE", "THYROIDAB" in the last 72 hours. Anemia Panel: No results for input(s): "VITAMINB12", "FOLATE", "FERRITIN", "TIBC", "IRON", "RETICCTPCT" in the last 72 hours. Urine analysis:    Component Value Date/Time   COLORURINE YELLOW 08/30/2020 2150   APPEARANCEUR HAZY (A) 08/30/2020 2150   LABSPEC 1.017 08/30/2020 2150   PHURINE 5.0 08/30/2020 2150   GLUCOSEU NEGATIVE 08/30/2020 2150   HGBUR NEGATIVE 08/30/2020 2150   BILIRUBINUR NEGATIVE 08/30/2020 2150   KETONESUR NEGATIVE 08/30/2020 2150   PROTEINUR NEGATIVE  08/30/2020 2150   UROBILINOGEN 0.2 09/11/2011 1525   NITRITE NEGATIVE 08/30/2020 2150   LEUKOCYTESUR NEGATIVE 08/30/2020 2150   Sepsis Labs: @LABRCNTIP (procalcitonin:4,lacticidven:4)  ) Recent Results (from the past 240 hours)  Surgical PCR screen     Status: Abnormal   Collection Time: 08/31/23  5:19 AM   Specimen: Nasal Mucosa; Nasal Swab  Result Value Ref Range Status   MRSA, PCR NEGATIVE NEGATIVE Final   Staphylococcus aureus POSITIVE (A) NEGATIVE Final    Comment: (NOTE) The Xpert SA Assay (FDA approved for NASAL specimens in patients 71 years of age and older), is one component of a comprehensive surveillance program. It is not intended to diagnose infection nor to guide or monitor treatment. Performed at [facility], [address], [place], [place] [address]          Radiology Studies: No results found.       Scheduled Meds:  (feeding supplement) PROSource Plus  30 mL Oral BID BM   allopurinol  100 mg Oral Daily   amLODipine  5 mg Oral Daily   docusate sodium  100 mg Oral BID   [START ON 09/04/2023]  enoxaparin (LOVENOX) injection  60 mg Subcutaneous Q24H   escitalopram  20 mg Oral Daily   ezetimibe  10 mg Oral Daily   insulin glargine-yfgn  35 Units Subcutaneous Daily   isosorbide mononitrate  60 mg Oral Daily   latanoprost  1 drop Left Eye QHS   metoprolol succinate  100 mg Oral Daily   multivitamin with minerals  1 tablet Oral Daily   pantoprazole  40 mg Oral Daily   pravastatin  40 mg Oral Daily   Continuous Infusions:   LOS: 3 days    Time spent: 35 minutes.    Berton Mount, MD  [facility] Pager #: [phone] 7PM-7AM contact night coverage as above

## 2023-09-03 NOTE — TOC Progression Note (Signed)
Transition of Care [facility]) - Progression Note    Patient Details  Name: CAREE FAKES MRN: [id number] Date of Birth: March 04, 1936  Transition of Care [facility]) CM/SW Contact  Lorri Frederick, LCSW Phone Number: 09/03/2023, 9:42 AM  Clinical Narrative:   CSW spoke with daughters Dois Davenport and Omak by phone, bed offers provided, they do want to accept offer at [facility].  CSW confirmed with Starr/[facility] that she can receive pt today with insurance auth.  Will need new PT note for insurance auth request.  PT aware.          Expected Discharge Plan and Services                                               Social Determinants of Health (SDOH) Interventions SDOH Screenings   Food Insecurity: No Food Insecurity (08/31/2023)  Housing: Low Risk  (08/31/2023)  Transportation Needs: No Transportation Needs (08/31/2023)  Utilities: Not At Risk (08/31/2023)  Depression (PHQ2-9): Low Risk  (05/29/2019)  Tobacco Use: Low Risk  (08/31/2023)    Readmission Risk Interventions     No data to display

## 2023-09-03 NOTE — Progress Notes (Signed)
Orthopaedic Trauma Progress Note  SUBJECTIVE: Doing okay this morning.  Pain controlled at rest.  Denies any significant numbness or tingling throughout the right lower extremity.  No chest pain. No SOB. No nausea/vomiting. No other complaints.  No family at bedside currently  OBJECTIVE:  Vitals:   09/02/23 2130 09/03/23 0519  BP: (!) 159/70 (!) 149/63  Pulse: 77 77  Resp: 18 16  Temp: 98.5 F (36.9 C) 98.9 F (37.2 C)  SpO2: 100% 99%    General: Sitting up in bed, no acute distress Respiratory: No increased work of breathing.  Right lower extremity: Ace wrap dressing from lower leg removed.  Mepilex stable, left in place.  Mepilex from the lateral thigh removed, incisions are clean, dry, intact.  Some soreness with palpation throughout the thigh and calf but no areas of significant point tenderness.  Ankle dorsiflexion/plantarflexion intact.  Able to wiggle toes.  Endorses sensation all aspects of the foot.  Neurovascularly intact.  80  IMAGING: Stable post op imaging.   LABS:  Results for orders placed or performed during the hospital encounter of 08/30/23 (from the past 24 hours)  Glucose, capillary     Status: Abnormal   Collection Time: 09/02/23 11:17 AM  Result Value Ref Range   Glucose-Capillary 202 (H) 70 - 99 mg/dL  Glucose, capillary     Status: Abnormal   Collection Time: 09/02/23  4:18 PM  Result Value Ref Range   Glucose-Capillary 144 (H) 70 - 99 mg/dL  Glucose, capillary     Status: Abnormal   Collection Time: 09/02/23  9:46 PM  Result Value Ref Range   Glucose-Capillary 175 (H) 70 - 99 mg/dL  Glucose, capillary     Status: Abnormal   Collection Time: 09/03/23  6:49 AM  Result Value Ref Range   Glucose-Capillary 117 (H) 70 - 99 mg/dL    ASSESSMENT: Jeanne [name] is a 87 y.o. female, 3 Days Post-Op s/p OPEN REDUCTION INTERNAL FIXATION RIGHT DISTAL FEMUR FRACTURE  CV/Blood loss: Acute blood loss anemia, Hgb 8.9 on 09/02/2023. Hemodynamically  stable  PLAN: Weightbearing: WBAT RLE ROM: Okay for unrestricted hip and knee motion Incisional and dressing care: Change dressings as needed Showering: Okay to begin showering getting incisions wet Orthopedic device(s): None  Pain management:  1. Robaxin 500 mg q 6 hours PRN 2. Oxycodone 5 mg q 4 hours PRN 3. Morphine 0.5 mg q 2 hours PRN VTE prophylaxis: Lovenox, SCDs ID:  Ancef 2gm post op completed Foley/Lines:  No foley, KVO IVFs Dispo: PT/OT evaluation ongoing, patient require SNF.  TOC following for placement. Okay for discharge from ortho standpoint once cleared by medicine team and therapies  D/C recommendations: -Oxycodone for pain control -Eliquis twice daily x 30 days for DVT prophylaxis   Follow - up plan: 2 weeks after discharge for wound check and repeat x-rays   Contact information:  Truitt Merle MD, Thyra Breed PA-C. After hours and holidays please check [email or web address] for group call information for [facility]   Thompson Caul, PA-C [phone] (office) [email or web address]

## 2023-09-03 NOTE — Progress Notes (Signed)
PHARMACY - ANTICOAGULATION CONSULT NOTE  Pharmacy Consult for Lovenox Indication: VTE prophylaxis  Allergies  Allergen Reactions   Amoxicillin Nausea Only    Has patient had a PCN reaction causing immediate rash, facial/tongue/throat swelling, SOB or lightheadedness with hypotension: "yes, i was dizzy" Has patient had a PCN reaction causing severe rash involving mucus membranes or skin necrosis: no Did a PCN reaction that required hospitalization : no, called the DR. Did PCN reaction occurring within the last 10 years: unk if all of the above answers are "NO", then may proceed with Cephalosporin use.    Canagliflozin Nausea Only and Other (See Comments)    Dizziness  Dizziness  Dizziness   Enalapril Maleate     Other reaction(s): angioedema   Liraglutide     Other reaction(s): Nausea and dizziness   Pioglitazone     Other reaction(s): macular edema   Tramadol Hcl     Other reaction(s): nausea, headache and dizziness    Patient Measurements: Height: 5\' 3"  (160 cm) Weight: (!) 139 kg (306 lb 7 oz) IBW/kg (Calculated) : 52.4  Vital Signs: Temp: 97.9 F (36.6 C) (12/30 1108) Temp Source: Oral (12/30 1108) BP: 167/72 (12/30 1108) Pulse Rate: 80 (12/30 1108)  Labs: Recent Labs    09/01/23 0452 09/01/23 1630 09/02/23 0441 09/03/23 1247  HGB 9.6*  --  8.9*  --   HCT 30.8*  --  28.7*  --   PLT 170  --  157  --   HEPRLOWMOCWT  --   --   --  0.53  CREATININE  --  1.91* 1.89*  --     Estimated Creatinine Clearance: 29.9 mL/min (A) (by C-G formula based on SCr of 1.89 mg/dL (H)).  Assessment: Jeanne [name] is a 87 y.o. female with medical history significant for CAD, hypertension, hyperlipidemia, type 2 diabetes mellitus, GERD, chronic diastolic heart failure, CKD 3B associated baseline creatinine range 1.5-2.0, who presented to ED s/p mechanical fall while attempting to get into car. She underwent ORIF of distal femur yesterday,08/31/23. Pharmacy has been consulted  for Lovenox for VTE prophylaxis in setting of obesity (BMI >50) and CKD.   Anti Xa level above goal at 0.53. No bleeding noted, Hgb down to 8.9 post-op, platelets are low normal.   Goal of Therapy:  Anti-Xa level 0.2-0.4 u/mL 4hrs after LMWH dose given Monitor platelets by anticoagulation protocol: Yes   Plan:  Change Lovenox to 60 mg SQ q24h Monitor Hgb and PLTs, signs and symptoms of bleeding Plan is to change to apixaban on discharge  Thank you for involving pharmacy in this patient's care.  Loura Back, PharmD, BCPS Clinical Pharmacist Clinical phone for 09/03/2023 is [phone] 09/03/2023 2:17 PM

## 2023-09-03 NOTE — Progress Notes (Signed)
PT Cancellation Note  Patient Details Name: Jeanne [name] MRN: [id number] DOB: Mar 30, 1936   Cancelled Treatment:    Reason Eval/Treat Not Completed: (P) Other (comment) (IV team or lab in room working with pt.) Will continue efforts per PT plan of care as schedule permits.   Dorathy Kinsman [place] Doig 09/03/2023, 12:41 PM

## 2023-09-04 ENCOUNTER — Encounter (HOSPITAL_COMMUNITY): Payer: Self-pay | Admitting: Student

## 2023-09-04 ENCOUNTER — Inpatient Hospital Stay (HOSPITAL_COMMUNITY): Payer: Medicare Other

## 2023-09-04 DIAGNOSIS — S72001A Fracture of unspecified part of neck of right femur, initial encounter for closed fracture: Secondary | ICD-10-CM | POA: Diagnosis not present

## 2023-09-04 LAB — GLUCOSE, CAPILLARY: Glucose-Capillary: 143 mg/dL — ABNORMAL HIGH (ref 70–99)

## 2023-09-04 MED ORDER — ADULT MULTIVITAMIN W/MINERALS CH: 1.0000 | ORAL_TABLET | Freq: Every day | ORAL | 1 refills | Status: DC

## 2023-09-04 MED ORDER — PROSOURCE PLUS PO LIQD: 30.0000 mL | Freq: Two times a day (BID) | ORAL | Status: AC

## 2023-09-04 MED ORDER — METOPROLOL SUCCINATE ER 100 MG PO TB24: 100.0000 mg | ORAL_TABLET | Freq: Every day | ORAL | 1 refills | Status: DC

## 2023-09-04 MED ORDER — INSULIN GLARGINE 100 UNIT/ML ~~LOC~~ SOLN: 35.0000 [IU] | Freq: Every day | SUBCUTANEOUS | 1 refills | Status: DC

## 2023-09-04 MED ORDER — SENNA 8.6 MG PO TABS
2.0000 | ORAL_TABLET | Freq: Every day | ORAL | Status: DC
  Administered 2023-09-04: 17.2 mg via ORAL
  Filled 2023-09-04: qty 2

## 2023-09-04 MED ORDER — POLYETHYLENE GLYCOL 3350 17 G PO PACK: 17.0000 g | PACK | Freq: Every day | ORAL | 0 refills | Status: AC | PRN

## 2023-09-04 MED ORDER — DOCUSATE SODIUM 100 MG PO CAPS: 100.0000 mg | ORAL_CAPSULE | Freq: Two times a day (BID) | ORAL | 0 refills | Status: DC

## 2023-09-04 NOTE — TOC Transition Note (Signed)
 Transition of Care [facility]) - Discharge Note   Patient Details  Name: Jeanne [name] MRN: [id number] Date of Birth: 07-13-1936  Transition of Care [facility]) CM/SW Contact:  Bridget Cordella Simmonds, LCSW Phone Number: 09/04/2023, 11:56 AM   Clinical Narrative:   Pt discharging to [place].  RN call report to [phone].      Final next level of care: Skilled Nursing Facility Barriers to Discharge: Barriers Resolved   Patient Goals and CMS Choice            Discharge Placement              Patient chooses bed at: [facility] Patient to be transferred to facility by: ptar Name of family member notified: daughter [name] Patient and family notified of of transfer: 09/04/23  Discharge Plan and Services Additional resources added to the After Visit Summary for                                       Social Drivers of Health (SDOH) Interventions SDOH Screenings   Food Insecurity: No Food Insecurity (08/31/2023)  Housing: Low Risk  (08/31/2023)  Transportation Needs: No Transportation Needs (08/31/2023)  Utilities: Not At Risk (08/31/2023)  Depression (PHQ2-9): Low Risk  (05/29/2019)  Social Connections: Unknown (09/03/2023)  Tobacco Use: Low Risk  (08/31/2023)     Readmission Risk Interventions     No data to display

## 2023-09-04 NOTE — Care Management Important Message (Signed)
 Important Message  Patient Details  Name: Jeanne [name] MRN: [id number] Date of Birth: 1935-12-28   Important Message Given:  Yes - Medicare IM     Renie Ora 09/04/2023, 12:29 PM

## 2023-09-04 NOTE — TOC Progression Note (Addendum)
 Transition of Care [facility]) - Progression Note    Patient Details  Name: Jeanne [name] MRN: [id number] Date of Birth: May 04, 1936  Transition of Care [facility]) CM/SW Contact  Bridget Cordella Simmonds, LCSW Phone Number: 09/04/2023, 8:24 AM  Clinical Narrative:   Shara request submitted in [place] and approved: [id number], 3 days: 12/31-1/2.   CSW confirmed with [facility] that they can receive pt today.      Expected Discharge Plan and Services                                               Social Determinants of Health (SDOH) Interventions SDOH Screenings   Food Insecurity: No Food Insecurity (08/31/2023)  Housing: Low Risk  (08/31/2023)  Transportation Needs: No Transportation Needs (08/31/2023)  Utilities: Not At Risk (08/31/2023)  Depression (PHQ2-9): Low Risk  (05/29/2019)  Social Connections: Unknown (09/03/2023)  Tobacco Use: Low Risk  (08/31/2023)    Readmission Risk Interventions     No data to display

## 2023-09-04 NOTE — Plan of Care (Signed)
  Problem: Education: Goal: Knowledge of General Education information will improve Description: Including pain rating scale, medication(s)/side effects and non-pharmacologic comfort measures Outcome: Progressing   Problem: Activity: Goal: Risk for activity intolerance will decrease Outcome: Progressing   Problem: Nutrition: Goal: Adequate nutrition will be maintained Outcome: Progressing   Problem: Pain Management: Goal: General experience of comfort will improve Outcome: Progressing   Problem: Safety: Goal: Ability to remain free from injury will improve Outcome: Progressing   Problem: Skin Integrity: Goal: Risk for impaired skin integrity will decrease Outcome: Progressing

## 2023-09-04 NOTE — Discharge Summary (Addendum)
 Physician Discharge Summary  Patient ID: Jeanne [name] MRN: [id number] DOB/AGE: 85/12/1935 85 y.o.  Admit date: 08/30/2023 Discharge date: 09/04/2023  Admission Diagnoses:  Discharge Diagnoses:  Principal Problem:   Hip fracture [facility]) Active Problems:   Closed nondisplaced segmental fracture of shaft of right femur (HCC)   Discharged Condition: stable  Hospital Course:  Patient is an 87 year old female with past medical history significant for coronary artery disease, hyperlipidemia, type 2 diabetes mellitus, hypertension, GERD, chronic kidney disease stage IIIb with baseline serum creatinine of 1.5-2; OSA on CPAP, and chronic diastolic congestive heart failure.  Patient was admitted with right distal femur fracture following a mechanical fall.  Patient underwent open reduction and internal fixation of the right distal femoral fracture on 08/31/2027.  Orthopedic team directed postop management.  Patient is optimized and will be discharged to a short term rehabilitation facility.    Right periprosthetic femur fracture: -Status post surgery. -Orthopedic team is directing postop care. -Pain is controlled. -For short-term rehab.     History of Stable Angina: - last cardiac cath  4/24  noted nonobstructive disease no change from prior cath  -Stable. -No chest pain noted during the hospital stay.  Chronic congestive heart failure with preserved ejection fraction: - no acute exacerbation currently  -Compensated.   CKDIIIb-IV: -Stable.   OSA: - cpap at bedtime    Hypertension: -Continue to monitor closely. -Metoprolol  XL was increased from 50 Mg p.o. once daily to 100 Mg p.o. once daily during the hospital stay. -Goal blood pressure should be less than 130/80 mmHg.      Hyperlipidemia: -Continued Pravachol .      Type 2 diabetes mellitus, uncontrolled: -Continue to monitor closely.     Consults: orthopedic surgery  Significant Diagnostic Studies:  RIGHT FEMUR 1  VIEW:   COMPARISON:  None Available.   FINDINGS: There is acute fracture deformity involving the distal right femoral shaft. This extends to within 3 mm of an otherwise intact right knee replacement. There is no evidence of dislocation. Soft tissue swelling is seen surrounding the previously noted fracture site.   IMPRESSION: Acute fracture of the distal right femoral shaft.    RIGHT KNEE - 1-2 VIEW:   COMPARISON:  None Available.   FINDINGS: An intact right knee replacement is seen. There is an acute fracture deformity involving the distal right femoral shaft. The distal fracture site is approximately 3 mm from the right femoral prosthesis of the previously noted knee replacement. There is no evidence of dislocation. A large suprapatellar effusion is noted.   IMPRESSION: 1. Acute fracture of the distal right femoral shaft. 2. Intact right knee replacement. 3. Large suprapatellar effusion.     Electronically Signed   By: Suzen Dials M.D.   On: 08/30/2023 23:05  Treatments: Open reduction and internal fixation of distal right femur fracture.  Discharge Exam: Blood pressure (!) 146/70, pulse 77, temperature 98.1 F (36.7 C), temperature source Oral, resp. rate 18, height 5' 3 (1.6 m), weight (!) 139 kg, SpO2 95%.   Disposition: Discharge disposition: 03-Skilled Nursing Facility       Discharge Instructions     Diet - low sodium heart healthy   Complete by: As directed    Increase activity slowly   Complete by: As directed       Allergies as of 09/04/2023       Reactions   Amoxicillin Nausea Only   Has patient had a PCN reaction causing immediate rash, facial/tongue/throat swelling, SOB or lightheadedness with  hypotension: yes, i was dizzy Has patient had a PCN reaction causing severe rash involving mucus membranes or skin necrosis: no Did a PCN reaction that required hospitalization : no, called the DR. Did PCN reaction occurring within the  last 10 years: unk if all of the above answers are NO, then may proceed with Cephalosporin use.   Canagliflozin Nausea Only, Other (See Comments)   Dizziness Dizziness  Dizziness   Enalapril  Maleate    Other reaction(s): angioedema   Liraglutide    Other reaction(s): Nausea and dizziness   Pioglitazone    Other reaction(s): macular edema   Tramadol Hcl    Other reaction(s): nausea, headache and dizziness        Medication List     STOP taking these medications    furosemide  20 MG tablet Commonly known as: LASIX    HM Lidocaine  Patch 4 % Generic drug: lidocaine        TAKE these medications    (feeding supplement) PROSource Plus liquid Take 30 mLs by mouth 2 (two) times daily between meals.   allopurinol  100 MG tablet Commonly known as: ZYLOPRIM  Take 100 mg by mouth daily.   amLODipine  5 MG tablet Commonly known as: NORVASC  Take 1 tablet (5 mg total) by mouth daily.   apixaban  2.5 MG Tabs tablet Commonly known as: Eliquis  Take 1 tablet (2.5 mg total) by mouth 2 (two) times daily.   aspirin  EC 81 MG tablet Commonly known as: Aspirin  Low Dose Take 1 tablet (81 mg total) by mouth daily.   chlorhexidine  4 % external liquid Commonly known as: HIBICLENS  Apply 15 mLs (1 Application total) topically as directed for 30 doses. Use as directed daily for 5 days every other week for 6 weeks.   Cholecalciferol 50 MCG (2000 UT) Tabs Take by mouth.   docusate sodium  100 MG capsule Commonly known as: COLACE Take 1 capsule (100 mg total) by mouth 2 (two) times daily.   escitalopram  10 MG tablet Commonly known as: LEXAPRO  TAKE 2 TABLETS BY MOUTH EVERY DAY   esomeprazole  20 MG capsule Commonly known as: NEXIUM  Take 1 capsule (20 mg total) by mouth daily at 12 noon.   ezetimibe  10 MG tablet Commonly known as: ZETIA  Take 10 mg by mouth daily.   ferrous sulfate  325 (65 FE) MG tablet Take 325 mg by mouth at bedtime.   hydrALAZINE  25 MG tablet Commonly known as:  APRESOLINE  Take 1 tablet (25 mg total) by mouth 3 (three) times daily.   insulin  glargine 100 UNIT/ML injection Commonly known as: LANTUS  Inject 0.35 mLs (35 Units total) into the skin daily before breakfast. What changed: how much to take   isosorbide  mononitrate 60 MG 24 hr tablet Commonly known as: IMDUR  Take 1 tablet (60 mg total) by mouth daily. What changed: how much to take   latanoprost  0.005 % ophthalmic solution Commonly known as: XALATAN  INSTILL 1 DROP IN LEFT EYE NIGHTLY   losartan  25 MG tablet Commonly known as: COZAAR  Take 1 tablet (25 mg total) by mouth 2 (two) times daily.   meclizine  12.5 MG tablet Commonly known as: ANTIVERT  Take 12.5 mg by mouth 3 (three) times daily as needed for dizziness.   metoprolol  succinate 100 MG 24 hr tablet Commonly known as: TOPROL -XL Take 1 tablet (100 mg total) by mouth daily. Take with or immediately following a meal. Start taking on: September 05, 2023 What changed:  medication strength how much to take   multivitamin with minerals Tabs tablet Take 1 tablet  by mouth daily. Start taking on: September 05, 2023   mupirocin  ointment 2 % Commonly known as: BACTROBAN  Place 1 Application into the nose 2 (two) times daily for 60 doses. Use as directed 2 times daily for 5 days every other week for 6 weeks.   nitroGLYCERIN  0.4 MG SL tablet Commonly known as: NITROSTAT  PLACE 1 TABLET UNDER THE TONGUE AS NEEDED FOR CHEST PAIN.   oxybutynin  10 MG 24 hr tablet Commonly known as: DITROPAN -XL Take 10 mg by mouth daily.   oxyCODONE  5 MG immediate release tablet Commonly known as: Roxicodone  Take 0.5-1 tablets (2.5-5 mg total) by mouth every 4 (four) hours as needed for up to 7 days for severe pain (pain score 7-10) or moderate pain (pain score 4-6).   polyethylene glycol 17 g packet Commonly known as: MIRALAX  / GLYCOLAX  Take 17 g by mouth daily as needed for mild constipation.   pravastatin  40 MG tablet Commonly known as:  PRAVACHOL  Take 1 tablet (40 mg total) by mouth daily.   REFRESH OP Place 1 drop into both eyes at bedtime.        Follow-up Information     Haddix, Franky SQUIBB, MD. Schedule an appointment as soon as possible for a visit in 2 week(s).   Specialty: Orthopedic Surgery Why: For wound check and repeat x-rays Contact information: [address] [place] [address] [phone]                 Time spent: 35 minutes.  SignedBETHA Leatrice LILLETTE Rosario 09/04/2023, 10:59 AM

## 2023-09-25 ENCOUNTER — Emergency Department (HOSPITAL_COMMUNITY)
Admission: EM | Admit: 2023-09-25 | Discharge: 2023-09-25 | Disposition: A | Discharge status: Home / Self Care | Payer: Medicare Other | Attending: Emergency Medicine | Admitting: Emergency Medicine

## 2023-09-25 ENCOUNTER — Other Ambulatory Visit: Payer: Self-pay

## 2023-09-25 ENCOUNTER — Emergency Department (HOSPITAL_COMMUNITY): Payer: Medicare Other

## 2023-09-25 ENCOUNTER — Encounter (HOSPITAL_COMMUNITY): Payer: Self-pay

## 2023-09-25 DIAGNOSIS — S0990XA Unspecified injury of head, initial encounter: Secondary | ICD-10-CM | POA: Diagnosis not present

## 2023-09-25 DIAGNOSIS — N189 Chronic kidney disease, unspecified: Secondary | ICD-10-CM | POA: Insufficient documentation

## 2023-09-25 DIAGNOSIS — I2510 Atherosclerotic heart disease of native coronary artery without angina pectoris: Secondary | ICD-10-CM | POA: Diagnosis not present

## 2023-09-25 DIAGNOSIS — W06XXXA Fall from bed, initial encounter: Secondary | ICD-10-CM | POA: Diagnosis not present

## 2023-09-25 DIAGNOSIS — R410 Disorientation, unspecified: Secondary | ICD-10-CM

## 2023-09-25 DIAGNOSIS — Z794 Long term (current) use of insulin: Secondary | ICD-10-CM | POA: Insufficient documentation

## 2023-09-25 DIAGNOSIS — E1122 Type 2 diabetes mellitus with diabetic chronic kidney disease: Secondary | ICD-10-CM | POA: Diagnosis not present

## 2023-09-25 DIAGNOSIS — Z7901 Long term (current) use of anticoagulants: Secondary | ICD-10-CM | POA: Diagnosis not present

## 2023-09-25 DIAGNOSIS — Z79899 Other long term (current) drug therapy: Secondary | ICD-10-CM | POA: Insufficient documentation

## 2023-09-25 DIAGNOSIS — R5383 Other fatigue: Secondary | ICD-10-CM | POA: Insufficient documentation

## 2023-09-25 DIAGNOSIS — E162 Hypoglycemia, unspecified: Secondary | ICD-10-CM

## 2023-09-25 DIAGNOSIS — I509 Heart failure, unspecified: Secondary | ICD-10-CM | POA: Diagnosis not present

## 2023-09-25 DIAGNOSIS — I130 Hypertensive heart and chronic kidney disease with heart failure and stage 1 through stage 4 chronic kidney disease, or unspecified chronic kidney disease: Secondary | ICD-10-CM | POA: Insufficient documentation

## 2023-09-25 LAB — CBC
HCT: 32 % — ABNORMAL LOW (ref 36.0–46.0)
Hemoglobin: 9.5 g/dL — ABNORMAL LOW (ref 12.0–15.0)
MCH: 26.8 pg (ref 26.0–34.0)
MCHC: 29.7 g/dL — ABNORMAL LOW (ref 30.0–36.0)
MCV: 90.1 fL (ref 80.0–100.0)
Platelets: 242 10*3/uL (ref 150–400)
RBC: 3.55 MIL/uL — ABNORMAL LOW (ref 3.87–5.11)
RDW: 16.3 % — ABNORMAL HIGH (ref 11.5–15.5)
WBC: 7.8 10*3/uL (ref 4.0–10.5)
nRBC: 0 % (ref 0.0–0.2)

## 2023-09-25 LAB — URINALYSIS, ROUTINE W REFLEX MICROSCOPIC
Bilirubin Urine: NEGATIVE
Glucose, UA: NEGATIVE mg/dL
Ketones, ur: NEGATIVE mg/dL
Leukocytes,Ua: NEGATIVE
Nitrite: NEGATIVE
Protein, ur: NEGATIVE mg/dL
Specific Gravity, Urine: 1.015 (ref 1.005–1.030)
pH: 5 (ref 5.0–8.0)

## 2023-09-25 LAB — BASIC METABOLIC PANEL
Anion gap: 10 (ref 5–15)
BUN: 19 mg/dL (ref 8–23)
CO2: 24 mmol/L (ref 22–32)
Calcium: 9.4 mg/dL (ref 8.9–10.3)
Chloride: 103 mmol/L (ref 98–111)
Creatinine, Ser: 1.47 mg/dL — ABNORMAL HIGH (ref 0.44–1.00)
GFR, Estimated: 35 mL/min — ABNORMAL LOW (ref >60)
Glucose, Bld: 130 mg/dL — ABNORMAL HIGH (ref 70–99)
Potassium: 4.6 mmol/L (ref 3.5–5.1)
Sodium: 137 mmol/L (ref 135–145)

## 2023-09-25 LAB — CK: Total CK: 41 U/L (ref 38–234)

## 2023-09-25 LAB — CBG MONITORING, ED
Glucose-Capillary: 111 mg/dL — ABNORMAL HIGH (ref 70–99)
Glucose-Capillary: 121 mg/dL — ABNORMAL HIGH (ref 70–99)

## 2023-09-25 NOTE — ED Notes (Signed)
Patient daughter called wanting an update [phone]

## 2023-09-25 NOTE — ED Provider Notes (Signed)
[place] EMERGENCY DEPARTMENT AT [facility] Provider Note   CSN: [id number] Arrival date & time: 09/25/23  1122     History  Chief Complaint  Patient presents with   Fall   Fatigue    Jeanne [name] is a 88 y.o. female.  Patient is an 88 year old female with past medical history of recent right femur fracture status post surgery on Eliquis, hypertension, diabetes, CAD, CHF, CKD, OSA presenting to the emergency department with altered mental status and a fall.  The patient reports that she fell out of bed last night.  She thinks that she was trying to get up when she was half asleep and fell onto her face.  She denies any loss of consciousness.  She denies any chest pain or lightheadedness or dizziness after the fall.  Her daughter reports that her nursing home facility said that she was confused and not responding normally.  They state that her blood pressure and blood sugar was low and they gave her some juice with improvement.  He states that her blood sugar went from 50s to 80s however her mental status was taking longer to return to normal than usual and they recommended she come to the ER.  The patient denies any new pain or injuries from the fall.  She denies any recent fever, cough or congestion, vomiting or diarrhea.  She states that she has been eating and drinking normally without any changes to her medication.  Her daughter reports she has been complaining of dysuria the last couple days.  The history is provided by the patient and a relative.  Fall       Home Medications Prior to Admission medications   Medication Sig Start Date End Date Taking? Authorizing Provider  acetaminophen (TYLENOL) 325 MG tablet Take 650 mg by mouth in the morning, at noon, and at bedtime.   Yes [provider]  allopurinol (ZYLOPRIM) 100 MG tablet Take 100 mg by mouth daily. 05/27/20  Yes [provider]  amLODipine (NORVASC) 5 MG tablet Take 1 tablet (5 mg total)  by mouth daily. 03/05/23  Yes Patwardhan, Anabel Bene, MD  apixaban (ELIQUIS) 2.5 MG TABS tablet Take 1 tablet (2.5 mg total) by mouth 2 (two) times daily. 09/03/23  Yes West Bali, PA-C  aspirin EC (ASPIRIN LOW DOSE) 81 MG tablet Take 1 tablet (81 mg total) by mouth daily. 02/13/23  Yes Patwardhan, Manish J, MD  docusate sodium (COLACE) 100 MG capsule Take 1 capsule (100 mg total) by mouth 2 (two) times daily. 09/04/23  Yes Berton Mount I, MD  escitalopram (LEXAPRO) 10 MG tablet TAKE 2 TABLETS BY MOUTH EVERY DAY 06/18/23  Yes Camara, Amalia Hailey, MD  esomeprazole (NEXIUM) 20 MG capsule Take 1 capsule (20 mg total) by mouth daily at 12 noon. 03/05/23  Yes Patwardhan, Manish J, MD  ezetimibe (ZETIA) 10 MG tablet Take 10 mg by mouth daily.   Yes [provider]  ferrous sulfate 325 (65 FE) MG tablet Take 325 mg by mouth at bedtime. 07/30/19  Yes [provider]  hydrALAZINE (APRESOLINE) 25 MG tablet Take 1 tablet (25 mg total) by mouth 3 (three) times daily. 03/22/23  Yes Patwardhan, Manish J, MD  insulin glargine (LANTUS) 100 UNIT/ML injection Inject 0.35 mLs (35 Units total) into the skin daily before breakfast. 09/04/23  Yes Barnetta Chapel, MD  isosorbide mononitrate (IMDUR) 60 MG 24 hr tablet Take 1 tablet (60 mg total) by mouth daily. 11/24/22 09/25/23 Yes  Pricilla Loveless, MD  latanoprost (XALATAN) 0.005 % ophthalmic solution INSTILL 1 DROP IN LEFT EYE NIGHTLY 12/05/21  Yes Rankin, Alford Highland, MD  losartan (COZAAR) 25 MG tablet Take 1 tablet (25 mg total) by mouth 2 (two) times daily. 02/21/23  Yes Patwardhan, Anabel Bene, MD  meclizine (ANTIVERT) 12.5 MG tablet Take 12.5 mg by mouth 3 (three) times daily as needed for dizziness.   Yes [provider]  metoprolol succinate (TOPROL-XL) 100 MG 24 hr tablet Take 1 tablet (100 mg total) by mouth daily. Take with or immediately following a meal. 09/05/23  Yes Barnetta Chapel, MD  Multiple Vitamin (MULTIVITAMIN WITH MINERALS) TABS  tablet Take 1 tablet by mouth daily. 09/05/23  Yes Berton Mount I, MD  nitroGLYCERIN (NITROSTAT) 0.4 MG SL tablet PLACE 1 TABLET UNDER THE TONGUE AS NEEDED FOR CHEST PAIN. 08/21/22  Yes Patwardhan, Manish J, MD  Nutritional Supplements (,FEEDING SUPPLEMENT, PROSOURCE PLUS) liquid Take 30 mLs by mouth 2 (two) times daily between meals. 09/04/23  Yes Berton Mount I, MD  oxybutynin (DITROPAN-XL) 10 MG 24 hr tablet Take 10 mg by mouth daily.   Yes [provider]  oxyCODONE (OXY IR/ROXICODONE) 5 MG immediate release tablet Take 5 mg by mouth every 6 (six) hours as needed for moderate pain (pain score 4-6) or severe pain (pain score 7-10). 09/24/23  Yes [provider]  polyethylene glycol (MIRALAX / GLYCOLAX) 17 g packet Take 17 g by mouth daily as needed for mild constipation. Patient taking differently: Take 17 g by mouth daily. 09/04/23  Yes Barnetta Chapel, MD  Polyvinyl Alcohol-Povidone (REFRESH OP) Place 1 drop into both eyes at bedtime.   Yes [provider]  pravastatin (PRAVACHOL) 40 MG tablet Take 1 tablet (40 mg total) by mouth daily. 08/14/23  Yes Patwardhan, Manish J, MD  chlorhexidine (HIBICLENS) 4 % external liquid Apply 15 mLs (1 Application total) topically as directed for 30 doses. Use as directed daily for 5 days every other week for 6 weeks. Patient not taking: Reported on 09/25/2023 08/31/23   West Bali, PA-C  Cholecalciferol 50 MCG (2000 UT) TABS Take by mouth. Patient not taking: Reported on 09/25/2023 07/30/19   [provider]  mupirocin ointment (BACTROBAN) 2 % Place 1 Application into the nose 2 (two) times daily for 60 doses. Use as directed 2 times daily for 5 days every other week for 6 weeks. Patient not taking: Reported on 09/25/2023 08/31/23 09/30/23  West Bali, PA-C      Allergies    Amoxicillin, Canagliflozin, Enalapril maleate, Liraglutide, Pioglitazone, and Tramadol hcl    Review of Systems   Review of  Systems  Physical Exam Updated Vital Signs BP 123/61 (BP Location: Left Arm)   Pulse 60   Temp 97.6 F (36.4 C) (Oral)   Resp 19   SpO2 97%  Physical Exam Vitals and nursing note reviewed.  Constitutional:      General: She is not in acute distress.    Appearance: Normal appearance.  HENT:     Head: Normocephalic and atraumatic.     Nose: Nose normal.     Mouth/Throat:     Mouth: Mucous membranes are moist.     Pharynx: Oropharynx is clear.  Eyes:     Extraocular Movements: Extraocular movements intact.     Conjunctiva/sclera: Conjunctivae normal.  Neck:     Comments: No midline neck tenderness Cardiovascular:     Rate and Rhythm: Normal rate and regular rhythm.  Heart sounds: Normal heart sounds.  Pulmonary:     Effort: Pulmonary effort is normal.     Breath sounds: Normal breath sounds.  Abdominal:     General: Abdomen is flat.     Palpations: Abdomen is soft.     Tenderness: There is no abdominal tenderness.  Musculoskeletal:        General: Normal range of motion.     Cervical back: Normal range of motion and neck supple.     Comments: No midline back tenderness No bony tenderness of bilateral upper or lower extremities Surgical scar on right thigh appears well-healing without any surrounding erythema, warmth or drainage  Skin:    General: Skin is warm and dry.  Neurological:     General: No focal deficit present.     Mental Status: She is alert and oriented to person, place, and time.     Sensory: No sensory deficit.     Motor: No weakness.  Psychiatric:        Mood and Affect: Mood normal.        Behavior: Behavior normal.     ED Results / Procedures / Treatments   Labs (all labs ordered are listed, but only abnormal results are displayed) Labs Reviewed  BASIC METABOLIC PANEL - Abnormal; Notable for the following components:      Result Value   Glucose, Bld 130 (*)    Creatinine, Ser 1.47 (*)    GFR, Estimated 35 (*)    All other components  within normal limits  CBC - Abnormal; Notable for the following components:   RBC 3.55 (*)    Hemoglobin 9.5 (*)    HCT 32.0 (*)    MCHC 29.7 (*)    RDW 16.3 (*)    All other components within normal limits  CBG MONITORING, ED - Abnormal; Notable for the following components:   Glucose-Capillary 111 (*)    All other components within normal limits  CK  URINALYSIS, ROUTINE W REFLEX MICROSCOPIC  CBG MONITORING, ED    EKG None  Radiology CT Head Wo Contrast Result Date: 09/25/2023 CLINICAL DATA:  Fall and trauma to the head. EXAM: CT HEAD WITHOUT CONTRAST TECHNIQUE: Contiguous axial images were obtained from the base of the skull through the vertex without intravenous contrast. RADIATION DOSE REDUCTION: This exam was performed according to the departmental dose-optimization program which includes automated exposure control, adjustment of the mA and/or kV according to patient size and/or use of iterative reconstruction technique. COMPARISON:  Head CT dated 08/30/2023. FINDINGS: Brain: Moderate age-related atrophy and chronic microvascular ischemic changes. There is no acute intracranial hemorrhage. No mass effect or midline shift. No extra-axial fluid collection. Vascular: No hyperdense vessel or unexpected calcification. Skull: Normal. Negative for fracture or focal lesion. Sinuses/Orbits: No acute finding. Other: None IMPRESSION: 1. No acute intracranial pathology. 2. Moderate age-related atrophy and chronic microvascular ischemic changes. Electronically Signed   By: Elgie Collard M.D.   On: 09/25/2023 12:55   DG Hip Unilat W or Wo Pelvis 2-3 Views Right Result Date: 09/25/2023 CLINICAL DATA:  Pain status post fall. EXAM: DG HIP (WITH OR WITHOUT PELVIS) 2-3V RIGHT COMPARISON:  Abdominal radiographs dated September 04, 2023. FINDINGS: There is no evidence of hip fracture or dislocation. Partially visualized lateral plate and screw fixation construct along the right femoral diaphysis. Moderate  degenerative changes of the bilateral hips manifested by joint space narrowing and marginal osteophytosis. The sacroiliac joints and pubic symphysis are anatomically aligned. IMPRESSION: 1. No acute  osseous abnormality. 2. Moderate osteoarthritis of the bilateral hips. Electronically Signed   By: Hart Robinsons M.D.   On: 09/25/2023 12:49   DG Knee Complete 4 Views Right Result Date: 09/25/2023 CLINICAL DATA:  Pain status post fall. EXAM: RIGHT KNEE - COMPLETE 4+ VIEW COMPARISON:  Right knee radiographs dated August 31, 2023. FINDINGS: Redemonstrated lateral plate and screw fixation of the distal femur spanning a comminuted distal femoral metadiaphyseal fracture with grossly similar alignment and mild-to-moderate diastasis. Postsurgical changes related to right total knee arthroplasty. No definite evidence of hardware complication. Small to moderate knee joint effusion. IMPRESSION: 1. Redemonstrated lateral plate and screw fixation of the distal femur spanning a comminuted distal femoral metadiaphyseal fracture with grossly similar alignment and mild-to-moderate diastasis. 2. Prior right total knee arthroplasty. No definite evidence of hardware complication. 3. Small-to-moderate knee joint effusion. Electronically Signed   By: Hart Robinsons M.D.   On: 09/25/2023 12:45    Procedures Procedures    Medications Ordered in ED Medications - No data to display  ED Course/ Medical Decision Making/ A&P Clinical Course as of 09/25/23 1516  Tue Sep 25, 2023  1502 Stable  40 YOF with AMS and fall. Femur fracture recent and admit to SNF.  Found down out of bed overnight. Back at baseline. Endorsed UTI symptoms. [CC]  1515 Patient signed out to Dr. Doran Durand pending repeat glucose, UA and likely plan for discharge. [VK]    Clinical Course User Index [CC] Glyn Ade, MD [VK] Rexford Maus, DO                                 Medical Decision Making This patient presents to the ED  with chief complaint(s) of fall, altered mental status with pertinent past medical history of recent femur fracture status postrepair, on Eliquis, hypertension, diabetes, CAD, CHF, CKD, OSA which further complicates the presenting complaint. The complaint involves an extensive differential diagnosis and also carries with it a high risk of complications and morbidity.    The differential diagnosis includes ICH, mass effect, no focal neurologic deficits making CVA or TIA less likely, hypo or hyperglycemia, infection, electrolyte abnormality  Additional history obtained: Additional history obtained from family Records reviewed previous admission documents  ED Course and Reassessment: On patient's arrival she was hemodynamically stable in no acute distress.  Head CT, x-rays and labs were ordered in triage.  Head CT showed no acute traumatic injury and no new fracture or malfunction of hardware on x-rays.  Her labs are at baseline without acute disease and Accu-Chek has been normal here.  The patient is at her neurologic baseline.  Will repeat Accu-Chek and she is tolerating p.o.  Urine is pending at this time.  Independent labs interpretation:  The following labs were independently interpreted: At baseline without acute abnormality  Independent visualization of imaging: - I independently visualized the following imaging with scope of interpretation limited to determining acute life threatening conditions related to emergency care: CT head, right hip and knee x-ray, which revealed no acute disease    Amount and/or Complexity of Data Reviewed Labs: ordered.          Final Clinical Impression(s) / ED Diagnoses Final diagnoses:  None    Rx / DC Orders ED Discharge Orders     None         Rexford Maus, DO 09/25/23 1516

## 2023-09-25 NOTE — ED Provider Triage Note (Signed)
Emergency Medicine Provider Triage Evaluation Note  PAT REINERTSEN , a 88 y.o. female  was evaluated in triage.  Pt complains of patient falling out of bed last night and being found by staff on the floor this morning.  Initially patient was altered and blood sugar was found to be 54 but after having some juice she woke up.  Patient is complaining of some pain on her right side.  She still seems a bit sleepy here she does not think she hit her head but she is on Eliquis..  Review of Systems  Positive: Fall out of bed, altered mental status, right leg pain Negative: Chest pain, shortness of breath, abdominal pain  Physical Exam  BP (!) 141/72   Pulse 64   Temp 97.6 F (36.4 C) (Oral)   Resp 17   SpO2 98%  Gen:   Sleepy but awakes to voice Resp:  Normal effort  MSK:   Moves extremities with some difficulty.  No pain with range of motion of the extremities.  Sutures in place on the right lower extremities but some pain with range of motion Other:  Cardiac exam with regular rate and rhythm  Medical Decision Making  Medically screening exam initiated at 2:09 PM.  Appropriate orders placed.  LANDREY BERNAL was informed that the remainder of the evaluation will be completed by another provider, this initial triage assessment does not replace that evaluation, and the importance of remaining in the ED until their evaluation is complete.     Gwyneth Sprout, MD 09/25/23 1410

## 2023-09-25 NOTE — ED Triage Notes (Signed)
Pt arrives via EMS from [facility]. EMS called staff due to patient being unresponsive. She had a unwitnessed fall yesterday evening. Upon EMS arrival patient was lethargic and only responded to painful stimuli. Nursing staff reported a blood sugar of 58. Patient was given orange juice, blood sugar with ems was 128. Pt arrives more alert. Pt is AxOx3 which is baseline. She is on eliquis.

## 2023-09-25 NOTE — ED Provider Notes (Signed)
Care of patient received from prior provider at 3:04 PM, please see their note for complete H/P and care plan.  Received handoff per ED course.  Clinical Course as of 09/25/23 2005  Tue Sep 25, 2023  1502 Stable  42 YOF with AMS and fall. Femur fracture recent and admit to SNF.  Found down out of bed overnight. Back at baseline. Endorsed UTI symptoms. [CC]  1515 Patient signed out to Dr. Doran Durand pending repeat glucose, UA and likely plan for discharge. [VK]    Clinical Course User Index [CC] Glyn Ade, MD [VK] Rexford Maus, DO    Reassessment: All workup resulted with no acute pathology.  Patient is well-appearing, labs grossly reassuring imaging grossly reassuring.  Patient stable to return to skilled nursing environment for ongoing care and management in the outpatient setting.  Disposition:  I have considered need for hospitalization, however, considering all of the above, I believe this patient is stable for discharge at this time.  Patient/family educated about specific return precautions for given chief complaint and symptoms.  Patient/family educated about follow-up with PCP.     Patient/family expressed understanding of return precautions and need for follow-up. Patient spoken to regarding all imaging and laboratory results and appropriate follow up for these results. All education provided in verbal form with additional information in written form. Time was allowed for answering of patient questions. Patient discharged.    Emergency Department Medication Summary:   Medications - No data to display        Glyn Ade, MD 09/25/23 2006

## 2023-09-25 NOTE — ED Notes (Signed)
Patient updated on plan of care. Daughter on record called and notified of patient disposition. Daughter without questions. Reports called and given to Anjorrn, Charity fundraiser at [facility]. No questions at this time.

## 2023-09-25 NOTE — ED Notes (Signed)
EDPA at [facility]. Family at [facility].

## 2023-11-01 ENCOUNTER — Other Ambulatory Visit: Payer: Self-pay

## 2023-11-01 ENCOUNTER — Emergency Department (HOSPITAL_COMMUNITY)
Admission: EM | Admit: 2023-11-01 | Discharge: 2023-11-02 | Disposition: A | Discharge status: Home / Self Care | Payer: Medicare Other | Attending: Emergency Medicine | Admitting: Emergency Medicine

## 2023-11-01 DIAGNOSIS — Z7982 Long term (current) use of aspirin: Secondary | ICD-10-CM | POA: Insufficient documentation

## 2023-11-01 DIAGNOSIS — N189 Chronic kidney disease, unspecified: Secondary | ICD-10-CM | POA: Insufficient documentation

## 2023-11-01 DIAGNOSIS — E1122 Type 2 diabetes mellitus with diabetic chronic kidney disease: Secondary | ICD-10-CM | POA: Insufficient documentation

## 2023-11-01 DIAGNOSIS — Z79899 Other long term (current) drug therapy: Secondary | ICD-10-CM | POA: Diagnosis not present

## 2023-11-01 DIAGNOSIS — N3001 Acute cystitis with hematuria: Secondary | ICD-10-CM | POA: Insufficient documentation

## 2023-11-01 DIAGNOSIS — Z794 Long term (current) use of insulin: Secondary | ICD-10-CM | POA: Insufficient documentation

## 2023-11-01 DIAGNOSIS — Z7901 Long term (current) use of anticoagulants: Secondary | ICD-10-CM | POA: Diagnosis not present

## 2023-11-01 DIAGNOSIS — Z7984 Long term (current) use of oral hypoglycemic drugs: Secondary | ICD-10-CM | POA: Diagnosis not present

## 2023-11-01 DIAGNOSIS — N939 Abnormal uterine and vaginal bleeding, unspecified: Secondary | ICD-10-CM | POA: Diagnosis present

## 2023-11-01 DIAGNOSIS — I129 Hypertensive chronic kidney disease with stage 1 through stage 4 chronic kidney disease, or unspecified chronic kidney disease: Secondary | ICD-10-CM | POA: Insufficient documentation

## 2023-11-01 LAB — CBC WITH DIFFERENTIAL/PLATELET
Abs Immature Granulocytes: 0.03 10*3/uL (ref 0.00–0.07)
Basophils Absolute: 0.1 10*3/uL (ref 0.0–0.1)
Basophils Relative: 1 %
Eosinophils Absolute: 0.4 10*3/uL (ref 0.0–0.5)
Eosinophils Relative: 5 %
HCT: 33.1 % — ABNORMAL LOW (ref 36.0–46.0)
Hemoglobin: 10.3 g/dL — ABNORMAL LOW (ref 12.0–15.0)
Immature Granulocytes: 0 %
Lymphocytes Relative: 17 %
Lymphs Abs: 1.3 10*3/uL (ref 0.7–4.0)
MCH: 27.2 pg (ref 26.0–34.0)
MCHC: 31.1 g/dL (ref 30.0–36.0)
MCV: 87.3 fL (ref 80.0–100.0)
Monocytes Absolute: 0.7 10*3/uL (ref 0.1–1.0)
Monocytes Relative: 10 %
Neutro Abs: 5.2 10*3/uL (ref 1.7–7.7)
Neutrophils Relative %: 67 %
Platelets: 260 10*3/uL (ref 150–400)
RBC: 3.79 MIL/uL — ABNORMAL LOW (ref 3.87–5.11)
RDW: 14.6 % (ref 11.5–15.5)
WBC: 7.6 10*3/uL (ref 4.0–10.5)
nRBC: 0 % (ref 0.0–0.2)

## 2023-11-01 LAB — URINALYSIS, ROUTINE W REFLEX MICROSCOPIC
Bilirubin Urine: NEGATIVE
Glucose, UA: NEGATIVE mg/dL
Ketones, ur: NEGATIVE mg/dL
Nitrite: NEGATIVE
Protein, ur: 100 mg/dL — AB
RBC / HPF: >50 RBC/hpf (ref 0–5)
Specific Gravity, Urine: 1.015 (ref 1.005–1.030)
WBC, UA: >50 WBC/hpf (ref 0–5)
pH: 5 (ref 5.0–8.0)

## 2023-11-01 LAB — BASIC METABOLIC PANEL
Anion gap: 11 (ref 5–15)
BUN: 17 mg/dL (ref 8–23)
CO2: 28 mmol/L (ref 22–32)
Calcium: 9.4 mg/dL (ref 8.9–10.3)
Chloride: 97 mmol/L — ABNORMAL LOW (ref 98–111)
Creatinine, Ser: 1.62 mg/dL — ABNORMAL HIGH (ref 0.44–1.00)
GFR, Estimated: 31 mL/min — ABNORMAL LOW (ref >60)
Glucose, Bld: 229 mg/dL — ABNORMAL HIGH (ref 70–99)
Potassium: 3.6 mmol/L (ref 3.5–5.1)
Sodium: 136 mmol/L (ref 135–145)

## 2023-11-01 LAB — POC OCCULT BLOOD, ED: Fecal Occult Bld: NEGATIVE

## 2023-11-01 NOTE — ED Provider Notes (Signed)
 [place] EMERGENCY DEPARTMENT AT [facility] Provider Note   CSN: [id number] Arrival date & time: 11/01/23  4098     History  Chief Complaint  Patient presents with   Vaginal Bleeding   Leg Pain   Foot Pain    CHRIS NARASIMHAN is a 88 y.o. female.  88 year old female with history of mild cognitive impairment, diabetes, hypertension, hyperlipidemia, and CKD who presents emergency department with blood in her briefs.  Accompanied by her daughter who reports that her other sister who the patient lives with noted blood in her briefs.  They wanted her to be brought into the emergency department for evaluation.  Patient denies any history of this previously.  No shortness of breath, fatigue, or dizziness.  Was on Eliquis last month for DVT prophylaxis after her femur surgery.  Has a history of MGUS but no history of gynecologic or GI cancer.  Daughter reports that she has a history of mild cognitive impairment has some difficulty providing history because of that.       Home Medications Prior to Admission medications   Medication Sig Start Date End Date Taking? Authorizing Provider  cephALEXin (KEFLEX) 500 MG capsule Take 1 capsule (500 mg total) by mouth 2 (two) times daily for 7 days. 11/02/23 11/09/23 Yes Rondel Baton, MD  acetaminophen (TYLENOL) 325 MG tablet Take 650 mg by mouth in the morning, at noon, and at bedtime.    [provider]  allopurinol (ZYLOPRIM) 100 MG tablet Take 100 mg by mouth daily. 05/27/20   [provider]  amLODipine (NORVASC) 5 MG tablet Take 1 tablet (5 mg total) by mouth daily. 03/05/23   Patwardhan, Anabel Bene, MD  apixaban (ELIQUIS) 2.5 MG TABS tablet Take 1 tablet (2.5 mg total) by mouth 2 (two) times daily. 09/03/23   West Bali, PA-C  aspirin EC (ASPIRIN LOW DOSE) 81 MG tablet Take 1 tablet (81 mg total) by mouth daily. 02/13/23   Patwardhan, Anabel Bene, MD  chlorhexidine (HIBICLENS) 4 % external liquid Apply 15 mLs (1  Application total) topically as directed for 30 doses. Use as directed daily for 5 days every other week for 6 weeks. Patient not taking: Reported on 09/25/2023 08/31/23   West Bali, PA-C  Cholecalciferol 50 MCG (2000 UT) TABS Take by mouth. Patient not taking: Reported on 09/25/2023 07/30/19   [provider]  docusate sodium (COLACE) 100 MG capsule Take 1 capsule (100 mg total) by mouth 2 (two) times daily. 09/04/23   Berton Mount I, MD  escitalopram (LEXAPRO) 10 MG tablet TAKE 2 TABLETS BY MOUTH EVERY DAY 06/18/23   Windell Norfolk, MD  esomeprazole (NEXIUM) 20 MG capsule Take 1 capsule (20 mg total) by mouth daily at 12 noon. 03/05/23   Patwardhan, Anabel Bene, MD  ezetimibe (ZETIA) 10 MG tablet Take 10 mg by mouth daily.    [provider]  ferrous sulfate 325 (65 FE) MG tablet Take 325 mg by mouth at bedtime. 07/30/19   [provider]  hydrALAZINE (APRESOLINE) 25 MG tablet Take 1 tablet (25 mg total) by mouth 3 (three) times daily. 03/22/23   Patwardhan, Anabel Bene, MD  insulin glargine (LANTUS) 100 UNIT/ML injection Inject 0.35 mLs (35 Units total) into the skin daily before breakfast. 09/04/23   Barnetta Chapel, MD  isosorbide mononitrate (IMDUR) 60 MG 24 hr tablet Take 1 tablet (60 mg total) by mouth daily. 11/24/22 09/25/23  Pricilla Loveless, MD  latanoprost (XALATAN) 0.005 %  ophthalmic solution INSTILL 1 DROP IN LEFT EYE NIGHTLY 12/05/21   Rankin, Alford Highland, MD  losartan (COZAAR) 25 MG tablet Take 1 tablet (25 mg total) by mouth 2 (two) times daily. 02/21/23   Patwardhan, Anabel Bene, MD  meclizine (ANTIVERT) 12.5 MG tablet Take 12.5 mg by mouth 3 (three) times daily as needed for dizziness.    [provider]  metoprolol succinate (TOPROL-XL) 100 MG 24 hr tablet Take 1 tablet (100 mg total) by mouth daily. Take with or immediately following a meal. 09/05/23   Barnetta Chapel, MD  Multiple Vitamin (MULTIVITAMIN WITH MINERALS) TABS tablet Take 1 tablet by  mouth daily. 09/05/23   Berton Mount I, MD  nitroGLYCERIN (NITROSTAT) 0.4 MG SL tablet PLACE 1 TABLET UNDER THE TONGUE AS NEEDED FOR CHEST PAIN. 08/21/22   Patwardhan, Anabel Bene, MD  Nutritional Supplements (,FEEDING SUPPLEMENT, PROSOURCE PLUS) liquid Take 30 mLs by mouth 2 (two) times daily between meals. 09/04/23   Berton Mount I, MD  oxybutynin (DITROPAN-XL) 10 MG 24 hr tablet Take 10 mg by mouth daily.    [provider]  oxyCODONE (OXY IR/ROXICODONE) 5 MG immediate release tablet Take 5 mg by mouth every 6 (six) hours as needed for moderate pain (pain score 4-6) or severe pain (pain score 7-10). 09/24/23   [provider]  polyethylene glycol (MIRALAX / GLYCOLAX) 17 g packet Take 17 g by mouth daily as needed for mild constipation. Patient taking differently: Take 17 g by mouth daily. 09/04/23   Barnetta Chapel, MD  Polyvinyl Alcohol-Povidone (REFRESH OP) Place 1 drop into both eyes at bedtime.    [provider]  pravastatin (PRAVACHOL) 40 MG tablet Take 1 tablet (40 mg total) by mouth daily. 08/14/23   Patwardhan, Anabel Bene, MD      Allergies    Amoxicillin, Canagliflozin, Enalapril maleate, Liraglutide, Pioglitazone, and Tramadol hcl    Review of Systems   Review of Systems  Physical Exam Updated Vital Signs BP (!) 171/83 (BP Location: Right Arm)   Pulse 80   Temp 98.1 F (36.7 C) (Oral)   Resp 18   Ht 5\' 4"  (1.626 m)   Wt (!) 146.1 kg   SpO2 100%   BMI 55.27 kg/m  Physical Exam Vitals and nursing note reviewed.  Constitutional:      General: She is not in acute distress.    Appearance: She is well-developed.  HENT:     Head: Normocephalic and atraumatic.     Right Ear: External ear normal.     Left Ear: External ear normal.     Nose: Nose normal.  Eyes:     Extraocular Movements: Extraocular movements intact.     Conjunctiva/sclera: Conjunctivae normal.     Pupils: Pupils are equal, round, and reactive to light.  Pulmonary:      Effort: Pulmonary effort is normal. No respiratory distress.  Abdominal:     General: Abdomen is flat. There is no distension.     Palpations: Abdomen is soft. There is no mass.     Tenderness: There is no abdominal tenderness. There is no guarding.  Genitourinary:    Comments: Chaperoned by patient's RN melanie. No external hemorrhoids or fissures noted on external inspection. No internal masses or hemorroids noted. Normal rectal tone. Brown stool in rectal vault. No melena or gross blood.  Chaperoned by Raina Mina.  External genitalia unremarkable. Nor rashes or lesions noted.  Speculum exam with normal appearing whitish vaginal discharge.  Vaginal  wall mucosa is unremarkable.  Cervix visualized and is unremarkable (closed in appearance without any protruding material).  Bimanual exam without cervical motion tenderness, adnexal tenderness or any masses appreciated.   Musculoskeletal:     Cervical back: Normal range of motion and neck supple.     Right lower leg: Edema present.     Left lower leg: Edema present.     Comments: No significant effusion of either ankle or knee.  Full range of motion of bilateral ankles and knees.  Skin:    General: Skin is warm and dry.  Neurological:     Mental Status: She is alert and oriented to person, place, and time. Mental status is at baseline.  Psychiatric:        Mood and Affect: Mood normal.     ED Results / Procedures / Treatments   Labs (all labs ordered are listed, but only abnormal results are displayed) Labs Reviewed  CBC WITH DIFFERENTIAL/PLATELET - Abnormal; Notable for the following components:      Result Value   RBC 3.79 (*)    Hemoglobin 10.3 (*)    HCT 33.1 (*)    All other components within normal limits  BASIC METABOLIC PANEL - Abnormal; Notable for the following components:   Chloride 97 (*)    Glucose, Bld 229 (*)    Creatinine, Ser 1.62 (*)    GFR, Estimated 31 (*)    All other components within normal limits   URINALYSIS, ROUTINE W REFLEX MICROSCOPIC - Abnormal; Notable for the following components:   Color, Urine AMBER (*)    APPearance CLOUDY (*)    Hgb urine dipstick LARGE (*)    Protein, ur 100 (*)    Leukocytes,Ua MODERATE (*)    Bacteria, UA MANY (*)    All other components within normal limits  POC OCCULT BLOOD, ED    EKG None  Radiology No results found.  Procedures Procedures    Medications Ordered in ED Medications  cephALEXin (KEFLEX) capsule 1,000 mg (1,000 mg Oral Given 11/02/23 0212)    ED Course/ Medical Decision Making/ A&P Clinical Course as of 11/03/23 0117  Thu Nov 01, 2023  2332 Signed out to Dr Clayborne Dana [RP]    Clinical Course User Index [RP] Rondel Baton, MD                                 Medical Decision Making Amount and/or Complexity of Data Reviewed Labs: ordered.  Risk Prescription drug management.   MONICKA CYRAN is a 88 y.o. female with comorbidities that complicate the patient evaluation including  mild cognitive impairment, diabetes, hypertension, hyperlipidemia, and CKD who presents emergency department with blood in her briefs.     Initial Ddx:  Hematochezia, GI bleed, vaginal bleeding, hematuria  MDM/Course:  Patient presents emergency department with blood found in her briefs.  Unclear exactly where this was coming from.  On exam is overall well-appearing.  No abdominal tenderness to palpation.  Pelvic exam without bleeding.  DRE without gross blood.  Hemoccult was negative as well.  Suspect that she may have some hematuria that is causing her symptoms.  Low concern for kidney stone or renal infarct given her lack of pain.  Not on blood thinners that would warrant admission.  Upon re-evaluation patient was stable.  Signed out to the oncoming physician awaiting results of her urinalysis.  This patient presents to the ED for  concern of complaints listed in HPI, this involves an extensive number of treatment options, and is a  complaint that carries with it a high risk of complications and morbidity. Disposition including potential need for admission considered.   Dispo: Pending remainder of workup  Additional history obtained from daughter Records reviewed Outpatient Clinic Notes The following labs were independently interpreted: CBC and show no acute abnormality I personally reviewed and interpreted the pt's EKG: see above for interpretation  I have reviewed the patients home medications and made adjustments as needed Social Determinants of health:  Elderly  Portions of this note were generated with Scientist, clinical (histocompatibility and immunogenetics). Dictation errors may occur despite best attempts at proofreading.     Final Clinical Impression(s) / ED Diagnoses Final diagnoses:  Acute cystitis with hematuria    Rx / DC Orders ED Discharge Orders          Ordered    cephALEXin (KEFLEX) 500 MG capsule  2 times daily        11/02/23 0052    cephALEXin (KEFLEX) 500 MG capsule  4 times daily,   Status:  Discontinued        11/02/23 0128              Rondel Baton, MD 11/03/23 [phone]

## 2023-11-01 NOTE — ED Provider Notes (Signed)
 11:33 PM Assumed care from Dr. Eloise Harman, please see their note for full history, physical and decision making until this point. In brief this is a 88 y.o. year old female who presented to the ED tonight with Vaginal Bleeding, Leg Pain, and Foot Pain     Pendign UA for discharge.   UA infected. Keflex started. Rx sent. Stable for d/c.   Discharge instructions, including strict return precautions for new or worsening symptoms, given. Patient and/or family verbalized understanding and agreement with the plan as described.   Labs, studies and imaging reviewed by myself and considered in medical decision making if ordered. Imaging interpreted by radiology.  Labs Reviewed  CBC WITH DIFFERENTIAL/PLATELET - Abnormal; Notable for the following components:      Result Value   RBC 3.79 (*)    Hemoglobin 10.3 (*)    HCT 33.1 (*)    All other components within normal limits  BASIC METABOLIC PANEL - Abnormal; Notable for the following components:   Chloride 97 (*)    Glucose, Bld 229 (*)    Creatinine, Ser 1.62 (*)    GFR, Estimated 31 (*)    All other components within normal limits  URINALYSIS, ROUTINE W REFLEX MICROSCOPIC  POC OCCULT BLOOD, ED    No orders to display    No follow-ups on file.    Aaryn Sermon, Barbara Cower, MD 11/02/23 [phone]

## 2023-11-01 NOTE — ED Triage Notes (Signed)
 Pt. Stated, My daughter wiping me said I had some bleeding , The daughter with pt.Jeanne [name] ) stated she has swollen rt. Foot and pain in right leg. Started swelling 2 weeks ago. Vaginal some blood yesterday.

## 2023-11-01 NOTE — ED Provider Triage Note (Signed)
 Emergency Medicine Provider Triage Evaluation Note  Jeanne [name] , a 88 y.o. female  was evaluated in triage.  Pt complains of blood, either from vagina or rectum. Was found in pad. No abdominal pain or dysuria. Also has chronic extremity pain.  Review of Systems  Positive: Blood in pad Negative: Dysuria, abd pain, diarrhea  Physical Exam  BP 135/70 (BP Location: Right Arm)   Pulse 68   Temp 98.3 F (36.8 C)   Resp 17   Ht 5\' 4"  (1.626 m)   Wt (!) 146.1 kg   SpO2 93%   BMI 55.27 kg/m  Gen:   Awake, no distress   Resp:  Normal effort Other:  No abdominal tenderness  Medical Decision Making  Medically screening exam initiated at 10:15 AM.  Appropriate orders placed.  ARALYNN BRAKE was informed that the remainder of the evaluation will be completed by another provider, this initial triage assessment does not replace that evaluation, and the importance of remaining in the ED until their evaluation is complete.  Labs/urine ordered. VSS.   Pricilla Loveless, MD 11/01/23 [phone]

## 2023-11-02 MED ORDER — CEPHALEXIN 250 MG PO CAPS
1000.0000 mg | ORAL_CAPSULE | Freq: Once | ORAL | Status: AC
  Administered 2023-11-02: 1000 mg via ORAL
  Filled 2023-11-02: qty 4

## 2023-11-02 MED ORDER — CEPHALEXIN 500 MG PO CAPS: 500.0000 mg | ORAL_CAPSULE | Freq: Four times a day (QID) | ORAL | 0 refills | Status: DC

## 2023-11-02 MED ORDER — CEPHALEXIN 500 MG PO CAPS: 500.0000 mg | ORAL_CAPSULE | Freq: Two times a day (BID) | ORAL | 0 refills | Status: AC

## 2023-11-02 NOTE — ED Notes (Signed)
 Pts daughter verbalized understanding of d/c instructions, prescription and follow up care. Pt wheeled out of ED via wheelchair, NAD.

## 2023-11-02 NOTE — Discharge Instructions (Addendum)
 You were seen for the blood in your urine in the emergency department.   At home, please take the antibiotics we have prescribed you.    Check your MyChart online for the results of any tests that had not resulted by the time you left the emergency department.   Follow-up with your primary doctor in 2-3 days regarding your visit.  Follow-up with urology about the blood in your urine.   Return immediately to the emergency department if you experience any of the following: fevers, or any other concerning symptoms.    Thank you for visiting our Emergency Department. It was a pleasure taking care of you today.

## 2023-12-04 ENCOUNTER — Ambulatory Visit: Payer: Medicare Other | Admitting: Podiatry

## 2023-12-08 ENCOUNTER — Other Ambulatory Visit: Payer: Self-pay | Admitting: Cardiology

## 2023-12-08 DIAGNOSIS — I2510 Atherosclerotic heart disease of native coronary artery without angina pectoris: Secondary | ICD-10-CM

## 2023-12-13 ENCOUNTER — Ambulatory Visit: Payer: Medicare Other | Admitting: Neurology

## 2023-12-28 ENCOUNTER — Telehealth: Payer: Self-pay | Admitting: Neurology

## 2023-12-28 NOTE — Telephone Encounter (Signed)
 Daughter requested a f/u due to symptoms worsening for pt, she declined wait list at this time

## 2024-01-01 ENCOUNTER — Emergency Department (HOSPITAL_COMMUNITY)

## 2024-01-01 ENCOUNTER — Observation Stay (HOSPITAL_COMMUNITY)
Admission: EM | Admit: 2024-01-01 | Discharge: 2024-01-06 | Disposition: A | Discharge status: Home Health | Attending: Internal Medicine | Admitting: Internal Medicine

## 2024-01-01 ENCOUNTER — Other Ambulatory Visit: Payer: Self-pay

## 2024-01-01 ENCOUNTER — Encounter (HOSPITAL_COMMUNITY): Payer: Self-pay

## 2024-01-01 ENCOUNTER — Telehealth: Payer: Self-pay | Admitting: Neurology

## 2024-01-01 ENCOUNTER — Observation Stay (HOSPITAL_COMMUNITY)

## 2024-01-01 DIAGNOSIS — I25118 Atherosclerotic heart disease of native coronary artery with other forms of angina pectoris: Secondary | ICD-10-CM | POA: Diagnosis present

## 2024-01-01 DIAGNOSIS — R339 Retention of urine, unspecified: Secondary | ICD-10-CM | POA: Insufficient documentation

## 2024-01-01 DIAGNOSIS — R82998 Other abnormal findings in urine: Secondary | ICD-10-CM | POA: Diagnosis present

## 2024-01-01 DIAGNOSIS — E876 Hypokalemia: Secondary | ICD-10-CM | POA: Insufficient documentation

## 2024-01-01 DIAGNOSIS — G9340 Encephalopathy, unspecified: Secondary | ICD-10-CM | POA: Diagnosis present

## 2024-01-01 DIAGNOSIS — Z6841 Body Mass Index (BMI) 40.0 and over, adult: Secondary | ICD-10-CM | POA: Diagnosis not present

## 2024-01-01 DIAGNOSIS — N390 Urinary tract infection, site not specified: Secondary | ICD-10-CM | POA: Insufficient documentation

## 2024-01-01 DIAGNOSIS — I2510 Atherosclerotic heart disease of native coronary artery without angina pectoris: Secondary | ICD-10-CM | POA: Insufficient documentation

## 2024-01-01 DIAGNOSIS — Z951 Presence of aortocoronary bypass graft: Secondary | ICD-10-CM | POA: Insufficient documentation

## 2024-01-01 DIAGNOSIS — D631 Anemia in chronic kidney disease: Secondary | ICD-10-CM | POA: Insufficient documentation

## 2024-01-01 DIAGNOSIS — N179 Acute kidney failure, unspecified: Secondary | ICD-10-CM | POA: Diagnosis present

## 2024-01-01 DIAGNOSIS — N1832 Chronic kidney disease, stage 3b: Secondary | ICD-10-CM | POA: Diagnosis not present

## 2024-01-01 DIAGNOSIS — F0390 Unspecified dementia without behavioral disturbance: Secondary | ICD-10-CM | POA: Insufficient documentation

## 2024-01-01 DIAGNOSIS — E1122 Type 2 diabetes mellitus with diabetic chronic kidney disease: Secondary | ICD-10-CM | POA: Insufficient documentation

## 2024-01-01 DIAGNOSIS — E11649 Type 2 diabetes mellitus with hypoglycemia without coma: Secondary | ICD-10-CM

## 2024-01-01 DIAGNOSIS — D638 Anemia in other chronic diseases classified elsewhere: Secondary | ICD-10-CM | POA: Diagnosis not present

## 2024-01-01 DIAGNOSIS — N183 Chronic kidney disease, stage 3 unspecified: Secondary | ICD-10-CM | POA: Diagnosis present

## 2024-01-01 DIAGNOSIS — I130 Hypertensive heart and chronic kidney disease with heart failure and stage 1 through stage 4 chronic kidney disease, or unspecified chronic kidney disease: Secondary | ICD-10-CM | POA: Diagnosis not present

## 2024-01-01 DIAGNOSIS — R5381 Other malaise: Secondary | ICD-10-CM | POA: Diagnosis not present

## 2024-01-01 DIAGNOSIS — I119 Hypertensive heart disease without heart failure: Secondary | ICD-10-CM

## 2024-01-01 DIAGNOSIS — E119 Type 2 diabetes mellitus without complications: Secondary | ICD-10-CM

## 2024-01-01 DIAGNOSIS — I5032 Chronic diastolic (congestive) heart failure: Secondary | ICD-10-CM | POA: Diagnosis present

## 2024-01-01 DIAGNOSIS — N184 Chronic kidney disease, stage 4 (severe): Secondary | ICD-10-CM | POA: Insufficient documentation

## 2024-01-01 DIAGNOSIS — E785 Hyperlipidemia, unspecified: Secondary | ICD-10-CM | POA: Diagnosis not present

## 2024-01-01 DIAGNOSIS — B9620 Unspecified Escherichia coli [E. coli] as the cause of diseases classified elsewhere: Secondary | ICD-10-CM | POA: Diagnosis not present

## 2024-01-01 DIAGNOSIS — R8290 Unspecified abnormal findings in urine: Principal | ICD-10-CM

## 2024-01-01 DIAGNOSIS — G9341 Metabolic encephalopathy: Principal | ICD-10-CM | POA: Insufficient documentation

## 2024-01-01 DIAGNOSIS — Z794 Long term (current) use of insulin: Secondary | ICD-10-CM

## 2024-01-01 LAB — URINALYSIS, W/ REFLEX TO CULTURE (INFECTION SUSPECTED)
Bilirubin Urine: NEGATIVE
Glucose, UA: NEGATIVE mg/dL
Hgb urine dipstick: NEGATIVE
Ketones, ur: NEGATIVE mg/dL
Nitrite: NEGATIVE
Protein, ur: NEGATIVE mg/dL
Specific Gravity, Urine: 1.009 (ref 1.005–1.030)
pH: 5 (ref 5.0–8.0)

## 2024-01-01 LAB — IRON AND TIBC
Iron: 29 ug/dL (ref 28–170)
Saturation Ratios: 11 % (ref 10.4–31.8)
TIBC: 270 ug/dL (ref 250–450)
UIBC: 241 ug/dL

## 2024-01-01 LAB — CBC
HCT: 39.4 % (ref 36.0–46.0)
Hemoglobin: 12 g/dL (ref 12.0–15.0)
MCH: 25.9 pg — ABNORMAL LOW (ref 26.0–34.0)
MCHC: 30.5 g/dL (ref 30.0–36.0)
MCV: 84.9 fL (ref 80.0–100.0)
Platelets: 247 10*3/uL (ref 150–400)
RBC: 4.64 MIL/uL (ref 3.87–5.11)
RDW: 14.8 % (ref 11.5–15.5)
WBC: 7.4 10*3/uL (ref 4.0–10.5)
nRBC: 0 % (ref 0.0–0.2)

## 2024-01-01 LAB — COMPREHENSIVE METABOLIC PANEL WITH GFR
ALT: 6 U/L (ref 0–44)
AST: 20 U/L (ref 15–41)
Albumin: 3 g/dL — ABNORMAL LOW (ref 3.5–5.0)
Alkaline Phosphatase: 76 U/L (ref 38–126)
Anion gap: 15 (ref 5–15)
BUN: 28 mg/dL — ABNORMAL HIGH (ref 8–23)
CO2: 29 mmol/L (ref 22–32)
Calcium: 9.6 mg/dL (ref 8.9–10.3)
Chloride: 91 mmol/L — ABNORMAL LOW (ref 98–111)
Creatinine, Ser: 2.43 mg/dL — ABNORMAL HIGH (ref 0.44–1.00)
GFR, Estimated: 19 mL/min — ABNORMAL LOW (ref >60)
Glucose, Bld: 88 mg/dL (ref 70–99)
Potassium: 3.7 mmol/L (ref 3.5–5.1)
Sodium: 135 mmol/L (ref 135–145)
Total Bilirubin: 0.8 mg/dL (ref 0.0–1.2)
Total Protein: 7.5 g/dL (ref 6.5–8.1)

## 2024-01-01 LAB — FERRITIN: Ferritin: 35 ng/mL (ref 11–307)

## 2024-01-01 LAB — GLUCOSE, CAPILLARY
Glucose-Capillary: 50 mg/dL — ABNORMAL LOW (ref 70–99)
Glucose-Capillary: 99 mg/dL (ref 70–99)

## 2024-01-01 LAB — RETICULOCYTES
Immature Retic Fract: 12.2 % (ref 2.3–15.9)
RBC.: 4.76 MIL/uL (ref 3.87–5.11)
Retic Count, Absolute: 69 10*3/uL (ref 19.0–186.0)
Retic Ct Pct: 1.5 % (ref 0.4–3.1)

## 2024-01-01 LAB — HEMOGLOBIN A1C
Hgb A1c MFr Bld: 6.9 % — ABNORMAL HIGH (ref 4.8–5.6)
Mean Plasma Glucose: 151.33 mg/dL

## 2024-01-01 MED ORDER — FERROUS SULFATE 325 (65 FE) MG PO TABS
325.0000 mg | ORAL_TABLET | Freq: Every day | ORAL | Status: DC
  Administered 2024-01-01 – 2024-01-04 (×4): 325 mg via ORAL
  Filled 2024-01-01 (×5): qty 1

## 2024-01-01 MED ORDER — INSULIN GLARGINE-YFGN 100 UNIT/ML ~~LOC~~ SOLN: 50.0000 [IU] | Freq: Every day | SUBCUTANEOUS | Status: DC

## 2024-01-01 MED ORDER — ONDANSETRON HCL 4 MG/2ML IJ SOLN: 4.0000 mg | Freq: Four times a day (QID) | INTRAMUSCULAR | Status: DC | PRN

## 2024-01-01 MED ORDER — SENNA 8.6 MG PO TABS
1.0000 | ORAL_TABLET | Freq: Two times a day (BID) | ORAL | Status: DC
  Administered 2024-01-01 – 2024-01-06 (×9): 8.6 mg via ORAL
  Filled 2024-01-01 (×10): qty 1

## 2024-01-01 MED ORDER — DOCUSATE SODIUM 100 MG PO CAPS
100.0000 mg | ORAL_CAPSULE | Freq: Two times a day (BID) | ORAL | Status: DC
  Administered 2024-01-01 – 2024-01-06 (×9): 100 mg via ORAL
  Filled 2024-01-01 (×10): qty 1

## 2024-01-01 MED ORDER — PRAVASTATIN SODIUM 40 MG PO TABS
40.0000 mg | ORAL_TABLET | Freq: Every day | ORAL | Status: DC
  Administered 2024-01-02 – 2024-01-04 (×4): 40 mg via ORAL
  Filled 2024-01-01 (×5): qty 1

## 2024-01-01 MED ORDER — BISACODYL 10 MG RE SUPP: 10.0000 mg | Freq: Every day | RECTAL | Status: DC | PRN

## 2024-01-01 MED ORDER — DEXTROSE 50 % IV SOLN: 25.0000 g | INTRAVENOUS | Status: AC

## 2024-01-01 MED ORDER — HYDROCODONE-ACETAMINOPHEN 5-325 MG PO TABS
1.0000 | ORAL_TABLET | ORAL | Status: DC | PRN
Start: 2024-01-01 — End: 2024-01-06
  Filled 2024-01-01 (×2): qty 2

## 2024-01-01 MED ORDER — METOPROLOL SUCCINATE ER 50 MG PO TB24
100.0000 mg | ORAL_TABLET | Freq: Every day | ORAL | Status: DC
  Administered 2024-01-02 – 2024-01-03 (×2): 100 mg via ORAL
  Filled 2024-01-01 (×2): qty 2

## 2024-01-01 MED ORDER — PANTOPRAZOLE SODIUM 40 MG PO TBEC
40.0000 mg | DELAYED_RELEASE_TABLET | Freq: Every day | ORAL | Status: DC
  Administered 2024-01-01 – 2024-01-06 (×6): 40 mg via ORAL
  Filled 2024-01-01 (×6): qty 1

## 2024-01-01 MED ORDER — ASPIRIN 81 MG PO TBEC
81.0000 mg | DELAYED_RELEASE_TABLET | Freq: Every day | ORAL | Status: DC
  Administered 2024-01-02 – 2024-01-06 (×5): 81 mg via ORAL
  Filled 2024-01-01 (×5): qty 1

## 2024-01-01 MED ORDER — SODIUM CHLORIDE 0.9 % IV SOLN: 1.0000 g | Freq: Once | INTRAVENOUS | Status: DC

## 2024-01-01 MED ORDER — SODIUM CHLORIDE 0.9 % IV SOLN
INTRAVENOUS | Status: AC
Start: 2024-01-01 — End: 2024-01-02

## 2024-01-01 MED ORDER — ISOSORBIDE MONONITRATE ER 60 MG PO TB24
60.0000 mg | ORAL_TABLET | Freq: Every day | ORAL | Status: DC
Start: 2024-01-01 — End: 2024-01-02

## 2024-01-01 MED ORDER — ACETAMINOPHEN 325 MG PO TABS: 650.0000 mg | ORAL_TABLET | Freq: Four times a day (QID) | ORAL | Status: DC | PRN

## 2024-01-01 MED ORDER — LACTATED RINGERS IV BOLUS
1000.0000 mL | Freq: Once | INTRAVENOUS | Status: AC
  Administered 2024-01-01: 1000 mL via INTRAVENOUS

## 2024-01-01 MED ORDER — LORAZEPAM 0.5 MG PO TABS
0.5000 mg | ORAL_TABLET | Freq: Two times a day (BID) | ORAL | Status: DC | PRN
  Administered 2024-01-01 – 2024-01-05 (×3): 0.5 mg via ORAL
  Filled 2024-01-01 (×3): qty 1

## 2024-01-01 MED ORDER — EZETIMIBE 10 MG PO TABS
10.0000 mg | ORAL_TABLET | Freq: Every day | ORAL | Status: DC
  Administered 2024-01-01 – 2024-01-06 (×6): 10 mg via ORAL
  Filled 2024-01-01 (×6): qty 1

## 2024-01-01 MED ORDER — AMLODIPINE BESYLATE 5 MG PO TABS
5.0000 mg | ORAL_TABLET | Freq: Every day | ORAL | Status: DC
  Administered 2024-01-01 – 2024-01-03 (×3): 5 mg via ORAL
  Filled 2024-01-01 (×3): qty 1

## 2024-01-01 MED ORDER — ONDANSETRON HCL 4 MG PO TABS: 4.0000 mg | ORAL_TABLET | Freq: Four times a day (QID) | ORAL | Status: DC | PRN

## 2024-01-01 MED ORDER — ACETAMINOPHEN 650 MG RE SUPP: 650.0000 mg | Freq: Four times a day (QID) | RECTAL | Status: DC | PRN

## 2024-01-01 MED ORDER — INSULIN ASPART 100 UNIT/ML IJ SOLN: 0.0000 [IU] | INTRAMUSCULAR | Status: DC

## 2024-01-01 NOTE — Assessment & Plan Note (Addendum)
-   Order Sensitive  SSI    -  check TSH and HgA1C  - Hold by mouth medications found to have CBG in 50' will hold off on on insulin  for now Follow cbg

## 2024-01-01 NOTE — Assessment & Plan Note (Signed)
 In the setting of AKI and dehydration.  UA showing bacteriuria but no evidence of UTI.  No fevers or chills no elevated white blood cell count. Foul-smelling urine could be in the setting of dehydration and concentrated urine Repeat and check urine cultures for completion. Rehydrate Hold medications can affect sensorium For completion obtain ammonia and VBG Check KUB for constipation Patient appears to be neurologically intact moving all 4 extremities Check B12 TSH and folate level

## 2024-01-01 NOTE — H&P (Addendum)
 Jeanne [name]:[id number] DOB: 1936/03/09 DOA: 01/01/2024     PCP: Merl Star, MD   Outpatient Specialists:  CARDS:   Dr. Cody Das, MD    NEurology   Dr. Samara Crest  Patient arrived to ER on 01/01/24 at 1316 Referred by Attending Lind Repine, MD   Patient coming from:    home Lives With family      Chief Complaint:   Chief Complaint  Patient presents with   Malodorous Urine    HPI: Jeanne [name] is a 88 y.o. female with medical history significant of dementia, CAD, Dm2, CKD IV, HLD, OSA on CPAP, IgG kappa monoclonal gammopathy, chronic diastolic CHF  Presented with    worsening confusion and decreased p.o. intake Patient has dementia diagnosed 1 year ago and has been doing worse  Has not slept in 2 night, became more restless  Became more disoriented  Talking about children that were not present. No cough no fever      Denies significant ETOH intake   Does not smoke        Regarding pertinent Chronic problems:    Hyperlipidemia -  on statins Pravachol  Lipid Panel     Component Value Date/Time   CHOL 116 12/07/2022 0404   TRIG 73 12/07/2022 0404   HDL 39 (L) 12/07/2022 0404   CHOLHDL 3.0 12/07/2022 0404   VLDL 15 12/07/2022 0404   LDLCALC 62 12/07/2022 0404     HTN on metoprolol    chronic CHF diastolic/systolic/ combined - last echo  Recent Results (from the past 81191 hours)  ECHOCARDIOGRAM COMPLETE   Collection Time: 04/01/19 10:46 AM  Result Value   Weight 5,163.2   Height 62   BP 119/64   Narrative     ECHOCARDIOGRAM REPORT     IMPRESSIONS    1. The left ventricle has normal systolic function with an ejection fraction of 60-65%. The cavity size was normal. There is mildly increased left ventricular wall thickness. Left ventricular diastolic Doppler parameters are consistent with impaired  relaxation. Elevated left ventricular end-diastolic pressure The E/e' is >47. No evidence of left ventricular regional wall motion  abnormalities.  2. The right ventricle has mildly reduced systolic function. The cavity was moderately enlarged. There is no increase in right ventricular wall thickness.  3. The aortic valve is grossly normal.  4. The aorta is normal in size and structure.            CAD  - On Aspirin , statin, betablocker,                 -  followed by cardiology                - last cardiac cath 4/24 noted nonobstructive disease no change from prior cath        DM 2 -  Lab Results  Component Value Date   HGBA1C 6.3 (H) 12/07/2022   on insulin , Lantus  60 units a day      Morbid obesity-   BMI Readings from Last 1 Encounters:  01/01/24 51.49 kg/m         OSA -on nocturnal  CPAP,        CKD stage IIIb   baseline Cr 1.6 Estimated Creatinine Clearance: 23.3 mL/min (A) (by C-G formula based on SCr of 2.43 mg/dL (H)).  Lab Results  Component Value Date   CREATININE 2.43 (H) 01/01/2024   CREATININE 1.62 (H) 11/01/2023   CREATININE 1.47 (H) 09/25/2023  Lab Results  Component Value Date   NA 135 01/01/2024   CL 91 (L) 01/01/2024   K 3.7 01/01/2024   CO2 29 01/01/2024   BUN 28 (H) 01/01/2024   CREATININE 2.43 (H) 01/01/2024   GFRNONAA 19 (L) 01/01/2024   CALCIUM  9.6 01/01/2024   PHOS 3.6 09/02/2023   ALBUMIN 3.0 (L) 01/01/2024   GLUCOSE 88 01/01/2024    Dementia - on not on any meds   Chronic anemia - baseline hg Hemoglobin & Hematocrit  Recent Labs    09/25/23 1241 11/01/23 1001 01/01/24 1434  HGB 9.5* 10.3* 12.0   Iron/TIBC/Ferritin/ %Sat    Component Value Date/Time   FERRITIN 443 (H) 10/17/2019 0331    While in ER:   CT head unremarkable UA showing few bacteria but otherwise not consistent with UTI    Lab Orders         Urine Culture         CBC         Urinalysis, w/ Reflex to Culture (Infection Suspected) -Urine, Clean Catch         Comprehensive metabolic panel with GFR         Urinalysis, Routine w reflex microscopic -Urine, Catheterized      CT HEAD    NON acute    CXR -  NON acute   KUB ordered Following Medications were ordered in ER: Medications  lactated ringers  bolus 1,000 mL (0 mLs Intravenous Stopped 01/01/24 1915)       ED Triage Vitals  Encounter Vitals Group     BP 01/01/24 1326 (!) 145/65     Systolic BP Percentile --      Diastolic BP Percentile --      Pulse Rate 01/01/24 1326 80     Resp 01/01/24 1326 15     Temp 01/01/24 1326 98.3 F (36.8 C)     Temp Source 01/01/24 1326 Oral     SpO2 01/01/24 1326 99 %     Weight 01/01/24 1324 300 lb (136.1 kg)     Height 01/01/24 1324 5\' 4"  (1.626 m)     Head Circumference --      Peak Flow --      Pain Score --      Pain Loc --      Pain Education --      Exclude from Growth Chart --   UJWJ(19)@     _________________________________________ Significant initial  Findings: Abnormal Labs Reviewed  CBC - Abnormal; Notable for the following components:      Result Value   MCH 25.9 (*)    All other components within normal limits  URINALYSIS, W/ REFLEX TO CULTURE (INFECTION SUSPECTED) - Abnormal; Notable for the following components:   APPearance HAZY (*)    Leukocytes,Ua SMALL (*)    Bacteria, UA MANY (*)    All other components within normal limits  COMPREHENSIVE METABOLIC PANEL WITH GFR - Abnormal; Notable for the following components:   Chloride 91 (*)    BUN 28 (*)    Creatinine, Ser 2.43 (*)    Albumin 3.0 (*)    GFR, Estimated 19 (*)    All other components within normal limits      ECG: Ordered      The recent clinical data is shown below. Vitals:   01/01/24 1545 01/01/24 1630 01/01/24 1730 01/01/24 1900  BP: (!) 159/87 102/75 133/87 (!) 149/87  Pulse: 85 90 79 93  Resp: 18 18 19  (!)  21  Temp:    98.4 F (36.9 C)  TempSrc:    Oral  SpO2: 98% 96% 100% 99%  Weight:      Height:        WBC     Component Value Date/Time   WBC 7.4 01/01/2024 1434   LYMPHSABS 1.3 11/01/2023 1001   MONOABS 0.7 11/01/2023 1001   EOSABS 0.4 11/01/2023 1001    BASOSABS 0.1 11/01/2023 1001      UA bacteriuria   Urine analysis:    Component Value Date/Time   COLORURINE YELLOW 01/01/2024 1347   APPEARANCEUR HAZY (A) 01/01/2024 1347   LABSPEC 1.009 01/01/2024 1347   PHURINE 5.0 01/01/2024 1347   GLUCOSEU NEGATIVE 01/01/2024 1347   HGBUR NEGATIVE 01/01/2024 1347   BILIRUBINUR NEGATIVE 01/01/2024 1347   KETONESUR NEGATIVE 01/01/2024 1347   PROTEINUR NEGATIVE 01/01/2024 1347   UROBILINOGEN 0.2 09/11/2011 1525   NITRITE NEGATIVE 01/01/2024 1347   LEUKOCYTESUR SMALL (A) 01/01/2024 1347     ___________________________________________________________    __________________________________________________________ Recent Labs  Lab 01/01/24 1540  NA 135  K 3.7  CO2 29  GLUCOSE 88  BUN 28*  CREATININE 2.43*  CALCIUM  9.6    Cr    Up from baseline see below Lab Results  Component Value Date   CREATININE 2.43 (H) 01/01/2024   CREATININE 1.62 (H) 11/01/2023   CREATININE 1.47 (H) 09/25/2023    Recent Labs  Lab 01/01/24 1540  AST 20  ALT 6  ALKPHOS 76  BILITOT 0.8  PROT 7.5  ALBUMIN 3.0*   Lab Results  Component Value Date   CALCIUM  9.6 01/01/2024   PHOS 3.6 09/02/2023    Plt: Lab Results  Component Value Date   PLT 247 01/01/2024       Recent Labs  Lab 01/01/24 1434  WBC 7.4  HGB 12.0  HCT 39.4  MCV 84.9  PLT 247    HG/HCT  stable,       Component Value Date/Time   HGB 12.0 01/01/2024 1434   HGB 12.0 01/27/2020 1044   HCT 39.4 01/01/2024 1434   MCV 84.9 01/01/2024 1434    _______________________________________________ Hospitalist was called for admission for   Malodorous urine, AKI,  dehydration metabolic encephalopathy     The following Work up has been ordered so far:  Orders Placed This Encounter  Procedures   Urine Culture   CT Head Wo Contrast   DG Chest Portable 1 View   CBC   Urinalysis, w/ Reflex to Culture (Infection Suspected) -Urine, Clean Catch   Comprehensive metabolic panel with GFR    Urinalysis, Routine w reflex microscopic -Urine, Catheterized   Diet NPO time specified   Document Height and Actual Weight   Saline Lock IV, Maintain IV access   In and Out Cath   In and Out Cath   Consult for Unassigned Medical Admission   Consult to hospitalist     OTHER Significant initial  Findings:  labs showing:         Cultures:    Component Value Date/Time   SDES BLOOD LEFT HAND 10/11/2019 1611   SPECREQUEST  10/11/2019 1611    BOTTLES DRAWN AEROBIC ONLY Blood Culture results may not be optimal due to an inadequate volume of blood received in culture bottles   CULT  10/11/2019 1611    NO GROWTH 5 DAYS Performed at [facility], [address], [place], [place] [address]    REPTSTATUS 10/16/2019 FINAL 10/11/2019 1611  Radiological Exams on Admission: DG Chest Portable 1 View Result Date: 01/01/2024 EXAM: 1 VIEW XRAY OF THE CHEST 01/01/2024 07:28:38 PM COMPARISON: 06/09/2020 CLINICAL HISTORY: AMS. Patient BIB [facility] from home for malodorous urine and concerns for UTI. Patient lives with family, family called other family who called PCP, PCP recommended send out. Patient has hx of dementia with agitation and hallucinations, A\T\Ox2 at baseline. FINDINGS: LUNGS AND PLEURA: No consolidation. No pulmonary edema. No pleural effusion. No pneumothorax. HEART AND MEDIASTINUM: No acute abnormality of the cardiac and mediastinal silhouettes. Thoracic aortic atherosclerosis. BONES AND SOFT TISSUES: No acute osseous abnormality. IMPRESSION: 1. No acute findings. Electronically signed by: Zadie Herter MD 01/01/2024 07:32 PM EDT RP Workstation: [id number]   CT Head Wo Contrast Result Date: 01/01/2024 CLINICAL DATA:  Mental status change, unknown cause malodorous urine and concerns for UTI. Patient lives with family, family called other family who called PCP, PCP recommended send out. EXAM: CT HEAD WITHOUT CONTRAST TECHNIQUE: Contiguous axial images were obtained from the base  of the skull through the vertex without intravenous contrast. RADIATION DOSE REDUCTION: This exam was performed according to the departmental dose-optimization program which includes automated exposure control, adjustment of the mA and/or kV according to patient size and/or use of iterative reconstruction technique. COMPARISON:  CT head 09/25/2023. FINDINGS: Brain: Patchy and confluent areas of decreased attenuation are noted throughout the deep and periventricular white matter of the cerebral hemispheres bilaterally, compatible with chronic microvascular ischemic disease. No evidence of large-territorial acute infarction. No parenchymal hemorrhage. No mass lesion. No extra-axial collection. No mass effect or midline shift. No hydrocephalus. Basilar cisterns are patent. Partial empty sella. Vascular: No hyperdense vessel. Atherosclerotic calcifications are present within the cavernous internal carotid arteries. Skull: No acute fracture or focal lesion. Sinuses/Orbits: Paranasal sinuses and mastoid air cells are clear. Bilateral lens replacement. Otherwise the orbits are unremarkable. Other: None. IMPRESSION: 1. No acute intracranial abnormality. 2. Partial empty sella. Findings is often a normal anatomic variant but can be associated with idiopathic intracranial hypertension (pseudotumor cerebri). Electronically Signed   By: Morgane  Naveau M.D.   On: 01/01/2024 18:22   _______________________________________________________________________________________________________ Latest  Blood pressure (!) 149/87, pulse 93, temperature 98.4 F (36.9 C), temperature source Oral, resp. rate (!) 21, height 5\' 4"  (1.626 m), weight 136.1 kg, SpO2 99%.   Vitals  labs and radiology finding personally reviewed  Review of Systems:    Pertinent positives include:    fatigue, hallucinations confusion Constitutional:  No weight loss, night sweats, Fevers, chills, weight loss  HEENT:  No headaches, Difficulty  swallowing,Tooth/dental problems,Sore throat,  No sneezing, itching, ear ache, nasal congestion, post nasal drip,  Cardio-vascular:  No chest pain, Orthopnea, PND, anasarca, dizziness, palpitations.no Bilateral lower extremity swelling  GI:  No heartburn, indigestion, abdominal pain, nausea, vomiting, diarrhea, change in bowel habits, loss of appetite, melena, blood in stool, hematemesis Resp:  no shortness of breath at rest. No dyspnea on exertion, No excess mucus, no productive cough, No non-productive cough, No coughing up of blood.No change in color of mucus.No wheezing. Skin:  no rash or lesions. No jaundice GU:  no dysuria, change in color of urine, no urgency or frequency. No straining to urinate.  No flank pain.  Musculoskeletal:  No joint pain or no joint swelling. No decreased range of motion. No back pain.  Psych:  No change in mood or affect. No depression or anxiety. No memory loss.  Neuro: no localizing neurological complaints, no tingling, no weakness, no double vision, no  gait abnormality, no slurred speech,    All systems reviewed and apart from HOPI all are negative _______________________________________________________________________________________________ Past Medical History:   Past Medical History:  Diagnosis Date   Anemia    Arthritis    Carotid arterial disease (HCC)    COVID-19    Diabetes mellitus    Hyperlipidemia    Hypertension    Migraine    Obesity    Reflux    Sleep apnea      Past Surgical History:  Procedure Laterality Date   CARDIAC CATHETERIZATION N/A 10/12/2015   Procedure: Left Heart Cath and Coronary Angiography;  Surgeon: Arty Binning, MD;  Location: [facility] INVASIVE CV LAB;  Service: Cardiovascular;  Laterality: N/A;   CHOLECYSTECTOMY  50 yrs ago   JOINT REPLACEMENT Right 2005   knee   LEFT HEART CATH AND CORONARY ANGIOGRAPHY N/A 12/07/2022   Procedure: LEFT HEART CATH AND CORONARY ANGIOGRAPHY;  Surgeon: Cody Das, MD;   Location: [facility] INVASIVE CV LAB;  Service: Cardiovascular;  Laterality: N/A;   ORIF FEMUR FRACTURE Right 08/31/2023   Procedure: OPEN REDUCTION INTERNAL FIXATION (ORIF) DISTAL FEMUR FRACTURE;  Surgeon: Laneta Pintos, MD;  Location: [facility] OR;  Service: Orthopedics;  Laterality: Right;   WISDOM TOOTH EXTRACTION      Social History:  Ambulatory   walker     reports that she has never smoked. She has never used smokeless tobacco. She reports that she does not drink alcohol and does not use drugs.   Family History:   Family History  Problem Relation Age of Onset   Heart attack Father    Heart attack Mother    ______________________________________________________________________________________________ Allergies: Allergies  Allergen Reactions   Amoxicillin Nausea Only    Has patient had a PCN reaction causing immediate rash, facial/tongue/throat swelling, SOB or lightheadedness with hypotension: "yes, i was dizzy" Has patient had a PCN reaction causing severe rash involving mucus membranes or skin necrosis: no Did a PCN reaction that required hospitalization : no, called the DR. Did PCN reaction occurring within the last 10 years: unk if all of the above answers are "NO", then may proceed with Cephalosporin use.    Canagliflozin Nausea Only and Other (See Comments)    Dizziness  Dizziness  Dizziness   Enalapril  Maleate     Other reaction(s): angioedema  Other Reaction(s): Other (See Comments)   Liraglutide     Other reaction(s): Nausea and dizziness   Pioglitazone     Other reaction(s): macular edema   Tramadol Hcl     Other reaction(s): nausea, headache and dizziness     Prior to Admission medications   Medication Sig Start Date End Date Taking? Authorizing Provider  acetaminophen  (TYLENOL ) 325 MG tablet Take 650 mg by mouth in the morning, at noon, and at bedtime.    [provider]  allopurinol  (ZYLOPRIM ) 100 MG tablet Take 100 mg by mouth daily. 05/27/20    [provider]  amLODipine  (NORVASC ) 5 MG tablet Take 1 tablet (5 mg total) by mouth daily. 03/05/23   Patwardhan, Kaye Parsons, MD  apixaban  (ELIQUIS ) 2.5 MG TABS tablet Take 1 tablet (2.5 mg total) by mouth 2 (two) times daily. 09/03/23   Versie Gores, PA-C  aspirin  EC (ASPIRIN  LOW DOSE) 81 MG tablet Take 1 tablet (81 mg total) by mouth daily. 02/13/23   Patwardhan, Kaye Parsons, MD  chlorhexidine  (HIBICLENS ) 4 % external liquid Apply 15 mLs (1 Application total) topically as directed for 30 doses. Use as  directed daily for 5 days every other week for 6 weeks. Patient not taking: Reported on 09/25/2023 08/31/23   Versie Gores, PA-C  Cholecalciferol 50 MCG (2000 UT) TABS Take by mouth. Patient not taking: Reported on 09/25/2023 07/30/19   [provider]  docusate sodium  (COLACE) 100 MG capsule Take 1 capsule (100 mg total) by mouth 2 (two) times daily. 09/04/23   Fonnie Iba I, MD  escitalopram  (LEXAPRO ) 10 MG tablet TAKE 2 TABLETS BY MOUTH EVERY DAY 06/18/23   Cassandra Cleveland, MD  esomeprazole  (NEXIUM ) 20 MG capsule Take 1 capsule (20 mg total) by mouth daily at 12 noon. 03/05/23   Patwardhan, Kaye Parsons, MD  ezetimibe  (ZETIA ) 10 MG tablet Take 10 mg by mouth daily.    [provider]  ferrous sulfate  325 (65 FE) MG tablet Take 325 mg by mouth at bedtime. 07/30/19   [provider]  hydrALAZINE  (APRESOLINE ) 25 MG tablet Take 1 tablet (25 mg total) by mouth 3 (three) times daily. 03/22/23   Patwardhan, Kaye Parsons, MD  insulin  glargine (LANTUS ) 100 UNIT/ML injection Inject 0.35 mLs (35 Units total) into the skin daily before breakfast. 09/04/23   Doroteo Gasmen, MD  isosorbide  mononitrate (IMDUR ) 60 MG 24 hr tablet Take 1 tablet (60 mg total) by mouth daily. 11/24/22 09/25/23  Jerilynn Montenegro, MD  latanoprost  (XALATAN ) 0.005 % ophthalmic solution INSTILL 1 DROP IN LEFT EYE NIGHTLY 12/05/21   Rankin, Alleen Arbour, MD  losartan  (COZAAR ) 25 MG tablet Take 1 tablet (25 mg  total) by mouth 2 (two) times daily. 02/21/23   Patwardhan, Kaye Parsons, MD  meclizine  (ANTIVERT ) 12.5 MG tablet Take 12.5 mg by mouth 3 (three) times daily as needed for dizziness.    [provider]  metoprolol  succinate (TOPROL -XL) 100 MG 24 hr tablet Take 1 tablet (100 mg total) by mouth daily. Take with or immediately following a meal. 09/05/23   Doroteo Gasmen, MD  Multiple Vitamin (MULTIVITAMIN WITH MINERALS) TABS tablet Take 1 tablet by mouth daily. 09/05/23   Fonnie Iba I, MD  nitroGLYCERIN  (NITROSTAT ) 0.4 MG SL tablet PLACE 1 TABLET UNDER THE TONGUE AS NEEDED FOR CHEST PAIN. 08/21/22   Patwardhan, Kaye Parsons, MD  Nutritional Supplements (,FEEDING SUPPLEMENT, PROSOURCE PLUS) liquid Take 30 mLs by mouth 2 (two) times daily between meals. 09/04/23   Doroteo Gasmen, MD  oxybutynin  (DITROPAN -XL) 10 MG 24 hr tablet Take 10 mg by mouth daily.    [provider]  oxyCODONE  (OXY IR/ROXICODONE ) 5 MG immediate release tablet Take 5 mg by mouth every 6 (six) hours as needed for moderate pain (pain score 4-6) or severe pain (pain score 7-10). 09/24/23   [provider]  polyethylene glycol (MIRALAX  / GLYCOLAX ) 17 g packet Take 17 g by mouth daily as needed for mild constipation. Patient taking differently: Take 17 g by mouth daily. 09/04/23   Doroteo Gasmen, MD  Polyvinyl Alcohol-Povidone (REFRESH OP) Place 1 drop into both eyes at bedtime.    [provider]  pravastatin  (PRAVACHOL ) 40 MG tablet Take 1 tablet (40 mg total) by mouth daily. 08/14/23   Cody Das, MD    ___________________________________________________________________________________________________ Physical Exam:    01/01/2024    7:00 PM 01/01/2024    5:30 PM 01/01/2024    4:30 PM  Vitals with BMI  Systolic 149 133 161  Diastolic 87 87 75  Pulse 93 79 90     1. General:  in No  Acute distress  Chronically ill  -appearing 2. Psychological: Alert and   Oriented 3.  Head/ENT:   Dry Mucous Membranes                          Head Non traumatic, neck supple                          Poor Dentition 4. SKIN: decreased Skin turgor,  Skin clean Dry and intact no rash    5. Heart: Regular rate and rhythm no  Murmur, no Rub or gallop 6. Lungs: no wheezes or crackles   7. Abdomen: Soft,  non-tender, Non distended   obese  bowel sounds present 8. Lower extremities: no clubbing, cyanosis, no  edema 9. Neurologically Grossly intact, moving all 4 extremities equally   10. MSK: Normal range of motion    Chart has been reviewed  ______________________________________________________________________________________________  Assessment/Plan  88 y.o. female with medical history significant of dementia, CAD, Dm2, CKD IV, HLD, OSAIgG kappa monoclonal gammopathy    Admitted for confusion,AKI    Present on Admission:  Acute encephalopathy  AKI (acute kidney injury) (HCC)  Anemia of chronic disease  Chronic diastolic CHF (congestive heart failure) (HCC)  CKD (chronic kidney disease), stage III (HCC)  Coronary artery disease involving native coronary artery of native heart without angina pectoris     Acute encephalopathy In the setting of AKI and dehydration.  UA showing bacteriuria but no evidence of UTI.  No fevers or chills no elevated white blood cell count. Foul-smelling urine could be in the setting of dehydration and concentrated urine Repeat and check urine cultures for completion. Rehydrate Hold medications can affect sensorium For completion obtain ammonia and VBG Check KUB for constipation Patient appears to be neurologically intact moving all 4 extremities Check B12 TSH and folate level  AKI (acute kidney injury) (HCC) In the setting of poor p.o. intake will rehydrate if persists will obtain further renal images. No back pain or abdominal pain to suggest renal abnormalities  Anemia of chronic disease Obtain anemia panel  Transfuse for Hg <8  (  given CAD)  rapidly dropping or  if symptomatic   Chronic diastolic CHF (congestive heart failure) (HCC) Currently appears to be somewhat on a dry side we will gently rehydrate follow fluid status  CKD (chronic kidney disease), stage III (HCC)  -chronic avoid nephrotoxic medications such as NSAIDs, Vanco Zosyn combo,  avoid hypotension, continue to follow renal function   Coronary artery disease involving native coronary artery of native heart without angina pectoris Chronic stable continue aspirin  81 mg a day Zetia  10 mg a day  toprol   100 mg a day and Pravachol  40 mg a day  DM2 (diabetes mellitus, type 2) (HCC)  - Order Sensitive  SSI   - continue home insulin  but decreased to  50units,  -  check TSH and HgA1C  - Hold by mouth medications    Family is started the process of palliative care consult Patient eventually may benefit from memory care placement.  Agitation -rule out biological causes  Expect some degree of sundowning associated with dementia.   Check bladder and bowels  Patient does not appear to be in pain  as needed low-dose Ativan as needed for tonight May benefit from follow-up with psychiatry for long-term management  Other plan as per orders.  DVT prophylaxis:  SCD      Code Status:   DNR/DNI  as per patient   I had personally discussed CODE STATUS with patient and family   ACP  has been reviewed      Family Communication:   Family   at  Bedside  plan of care was discussed   with   Daughter,   Diet  Diet Orders (From admission, onward)     Start     Ordered   01/01/24 1335  Diet NPO time specified  Diet effective now        01/01/24 1335            Disposition Plan:     likely will need placement for rehabilitation                           Following barriers for discharge:                                                          Electrolytes corrected                              Mental state back to baseline       Consult Orders   (From admission, onward)           Start     Ordered   01/01/24 1901  Consult to hospitalist  Pg by Landon Pinion  Once       Provider:  (Not yet assigned)  Question Answer Comment  Place call to: [facility]   Reason for Consult Admit      01/01/24 1900                               Would benefit from PT/OT eval prior to DC  Ordered                                   Transition of care consulted                                      Palliative care    consulted                                       Consults called: None   Admission status:  ED Disposition     ED Disposition  Admit   Condition  --   Comment  Hospital Area: [facility] [[id number]]  Level of Care: Telemetry Medical [[id number]]  May place patient in observation at [facility] or [facility] if equivalent level of care is available:: No  Covid Evaluation: Asymptomatic - no recent exposure (last 10 days) testing not required  Diagnosis: Acute encephalopathy [[id number]]  Admitting Physician: Lyman Balingit [[id number]]  Attending Physician: Othell Jaime [[id number]]           Obs      Level of care     tele  For 12H  indefinitely please discontinue once patient no longer qualifies  COVID-19 Labs    Selene Dais 01/01/2024, 9:18 PM    [facility] Hospitalists     after 2 AM please page floor coverage   If 7AM-7PM, please contact the day team taking care of the patient using [email or web address]

## 2024-01-01 NOTE — ED Provider Notes (Signed)
  EMERGENCY DEPARTMENT AT [facility] Provider Note   CSN: [id number] Arrival date & time: 01/01/24  1316     History  Chief Complaint  Patient presents with   Malodorous Urine    Jeanne [name] is a 88 y.o. female with history of hypertension migraines, diabetes, obesity, sleep apnea, carotid arterial disease, GERD, with a relatively new diagnosis of dementia about a year ago, who presents to the emergency department with malodorous urine.  Per daughter at baseline, they state that patient tends to hold her urine, but they have noticed that she has had more urinary hesitancy and noted a foul odor to her urine.  She currently lives with family, and they are just now enrolling her in palliative care.  They are still trying to understand her dementia diagnosis and what this means in terms of prognosis.  Patient at baseline can have some agitation and hallucinations, but as of right now appears to be at her mental status baseline.  Level 5 caveat due to dementia  HPI     Home Medications Prior to Admission medications   Medication Sig Start Date End Date Taking? Authorizing Provider  acetaminophen  (TYLENOL ) 325 MG tablet Take 650 mg by mouth in the morning, at noon, and at bedtime.    [provider]  allopurinol  (ZYLOPRIM ) 100 MG tablet Take 100 mg by mouth daily. 05/27/20   [provider]  amLODipine  (NORVASC ) 5 MG tablet Take 1 tablet (5 mg total) by mouth daily. 03/05/23   Patwardhan, Kaye Parsons, MD  apixaban  (ELIQUIS ) 2.5 MG TABS tablet Take 1 tablet (2.5 mg total) by mouth 2 (two) times daily. 09/03/23   Versie Gores, PA-C  aspirin  EC (ASPIRIN  LOW DOSE) 81 MG tablet Take 1 tablet (81 mg total) by mouth daily. 02/13/23   Patwardhan, Kaye Parsons, MD  chlorhexidine  (HIBICLENS ) 4 % external liquid Apply 15 mLs (1 Application total) topically as directed for 30 doses. Use as directed daily for 5 days every other week for 6 weeks. Patient not taking:  Reported on 09/25/2023 08/31/23   Versie Gores, PA-C  Cholecalciferol 50 MCG (2000 UT) TABS Take by mouth. Patient not taking: Reported on 09/25/2023 07/30/19   [provider]  docusate sodium  (COLACE) 100 MG capsule Take 1 capsule (100 mg total) by mouth 2 (two) times daily. 09/04/23   Fonnie Iba I, MD  escitalopram  (LEXAPRO ) 10 MG tablet TAKE 2 TABLETS BY MOUTH EVERY DAY 06/18/23   Camara, Amadou, MD  esomeprazole  (NEXIUM ) 20 MG capsule Take 1 capsule (20 mg total) by mouth daily at 12 noon. 03/05/23   Patwardhan, Kaye Parsons, MD  ezetimibe  (ZETIA ) 10 MG tablet Take 10 mg by mouth daily.    [provider]  ferrous sulfate  325 (65 FE) MG tablet Take 325 mg by mouth at bedtime. 07/30/19   [provider]  hydrALAZINE  (APRESOLINE ) 25 MG tablet Take 1 tablet (25 mg total) by mouth 3 (three) times daily. 03/22/23   Patwardhan, Kaye Parsons, MD  insulin  glargine (LANTUS ) 100 UNIT/ML injection Inject 0.35 mLs (35 Units total) into the skin daily before breakfast. 09/04/23   Doroteo Gasmen, MD  isosorbide  mononitrate (IMDUR ) 60 MG 24 hr tablet Take 1 tablet (60 mg total) by mouth daily. 11/24/22 09/25/23  Jerilynn Montenegro, MD  latanoprost  (XALATAN ) 0.005 % ophthalmic solution INSTILL 1 DROP IN LEFT EYE NIGHTLY 12/05/21   Rankin, Alleen Arbour, MD  losartan  (COZAAR ) 25 MG tablet Take 1 tablet (25  mg total) by mouth 2 (two) times daily. 02/21/23   Patwardhan, Kaye Parsons, MD  meclizine  (ANTIVERT ) 12.5 MG tablet Take 12.5 mg by mouth 3 (three) times daily as needed for dizziness.    [provider]  metoprolol  succinate (TOPROL -XL) 100 MG 24 hr tablet Take 1 tablet (100 mg total) by mouth daily. Take with or immediately following a meal. 09/05/23   Doroteo Gasmen, MD  Multiple Vitamin (MULTIVITAMIN WITH MINERALS) TABS tablet Take 1 tablet by mouth daily. 09/05/23   Fonnie Iba I, MD  nitroGLYCERIN  (NITROSTAT ) 0.4 MG SL tablet PLACE 1 TABLET UNDER THE TONGUE AS NEEDED FOR CHEST  PAIN. 08/21/22   Patwardhan, Kaye Parsons, MD  Nutritional Supplements (,FEEDING SUPPLEMENT, PROSOURCE PLUS) liquid Take 30 mLs by mouth 2 (two) times daily between meals. 09/04/23   Doroteo Gasmen, MD  oxybutynin  (DITROPAN -XL) 10 MG 24 hr tablet Take 10 mg by mouth daily.    [provider]  oxyCODONE  (OXY IR/ROXICODONE ) 5 MG immediate release tablet Take 5 mg by mouth every 6 (six) hours as needed for moderate pain (pain score 4-6) or severe pain (pain score 7-10). 09/24/23   [provider]  polyethylene glycol (MIRALAX  / GLYCOLAX ) 17 g packet Take 17 g by mouth daily as needed for mild constipation. Patient taking differently: Take 17 g by mouth daily. 09/04/23   Doroteo Gasmen, MD  Polyvinyl Alcohol-Povidone (REFRESH OP) Place 1 drop into both eyes at bedtime.    [provider]  pravastatin  (PRAVACHOL ) 40 MG tablet Take 1 tablet (40 mg total) by mouth daily. 08/14/23   Patwardhan, Kaye Parsons, MD      Allergies    Amoxicillin, Canagliflozin, Enalapril  maleate, Liraglutide, Pioglitazone, and Tramadol hcl    Review of Systems   Review of Systems  Unable to perform ROS: Dementia    Physical Exam Updated Vital Signs BP 136/79   Pulse 89   Temp 98.3 F (36.8 C) (Oral)   Resp 18   Ht 5\' 4"  (1.626 m)   Wt 136.1 kg   SpO2 93%   BMI 51.49 kg/m  Physical Exam Vitals and nursing note reviewed.  Constitutional:      Appearance: Normal appearance.  HENT:     Head: Normocephalic and atraumatic.  Eyes:     Conjunctiva/sclera: Conjunctivae normal.  Cardiovascular:     Rate and Rhythm: Normal rate and regular rhythm.  Pulmonary:     Effort: Pulmonary effort is normal. No respiratory distress.     Breath sounds: Normal breath sounds.  Abdominal:     General: There is no distension.     Palpations: Abdomen is soft.     Tenderness: There is no abdominal tenderness.  Skin:    General: Skin is warm and dry.  Neurological:     General: No focal deficit  present.     Mental Status: She is alert. Mental status is at baseline.     Comments: Moving all extremities without difficulty, no slurred speech, no facial droop     ED Results / Procedures / Treatments   Labs (all labs ordered are listed, but only abnormal results are displayed) Labs Reviewed  CBC - Abnormal; Notable for the following components:      Result Value   MCH 25.9 (*)    All other components within normal limits  COMPREHENSIVE METABOLIC PANEL WITH GFR  URINALYSIS, W/ REFLEX TO CULTURE (INFECTION SUSPECTED)    EKG None  Radiology No results found.  Procedures  Procedures    Medications Ordered in ED Medications - No data to display  ED Course/ Medical Decision Making/ A&P                                 Medical Decision Making Amount and/or Complexity of Data Reviewed Labs: ordered.  This patient is a 88 y.o. female  who presents to the ED for concern of malodorous urine.   Differential diagnoses prior to evaluation: The emergent differential diagnosis includes, but is not limited to,  UTI, sepsis. This is not an exhaustive differential.   Past Medical History / Co-morbidities / Social History: hypertension migraines, diabetes, obesity, sleep apnea, carotid arterial disease, GERD  Physical Exam: Physical exam performed. The pertinent findings include: Normal vitals, no distress. At baseline mental status per daughter. Abdomen soft and nontender. Smells of urine.   Lab Tests/Imaging studies: I personally interpreted labs/imaging and the pertinent results include:  no leukocytosis. CMP and UA pending.    Disposition: Patient discussed and care transferred to Zelaya PA-C at shift change. Please see his/her note for further details regarding further ED course and disposition. Plan at time of handoff is follow up on UA results. Discussion had with family member at bedside was that she feels comfortable caring for the patient at home if she needs to be  given antibiotics. There was concern about agitation expressed in telephone notes to the neurologist, but daughter at bedside feels strongly that patient is currently at her baseline and they are having to learn how to handle her dementia. I offered possible admission if urine is infected leading to patient being somewhat encephalopathic, and daughter did not seem to think that would be necessary. Anticipate conversation to continue upon ultimate disposition.    Final Clinical Impression(s) / ED Diagnoses Final diagnoses:  Malodorous urine    Rx / DC Orders ED Discharge Orders     None      Portions of this report may have been transcribed using voice recognition software. Every effort was made to ensure accuracy; however, inadvertent computerized transcription errors may be present.    Vihaan Gloss T, PA-C 01/01/24 1500    Steinl, Kevin, MD 01/02/24 1535

## 2024-01-01 NOTE — Assessment & Plan Note (Addendum)
 Currently appears to be somewhat on a dry side we will gently rehydrate follow fluid status

## 2024-01-01 NOTE — ED Triage Notes (Signed)
 Patient BIB [facility] from home for malodorous urine and concerns for UTI. Patient lives with family, family called other family who called PCP, PCP recommended send out. Patient has hx of dementia with agitation and hallucinations, A&Ox2 at baseline. VSS.  BP 124/80 Hr 88 97% RA CBG 110 RR15

## 2024-01-01 NOTE — ED Provider Notes (Signed)
 Accepted handoff at shift change from [place], PA-C. Please see prior provider note for more detail.   Briefly: Patient is 88 y.o.   DDX: concern for altered mental status, encephalopathy, UTI, bowel obstruction, sepsis  Plan: Disposition per results of lab workup including urinalysis.  Physical Exam  BP (!) 149/87   Pulse 93   Temp 98.4 F (36.9 C) (Oral)   Resp (!) 21   Ht 5\' 4"  (1.626 m)   Wt 136.1 kg   SpO2 99%   BMI 51.49 kg/m   Physical Exam Vitals and nursing note reviewed.  Constitutional:      Appearance: Normal appearance.  HENT:     Head: Normocephalic and atraumatic.  Eyes:     Conjunctiva/sclera: Conjunctivae normal.  Cardiovascular:     Rate and Rhythm: Normal rate and regular rhythm.  Pulmonary:     Effort: Pulmonary effort is normal. No respiratory distress.     Breath sounds: Normal breath sounds.  Abdominal:     General: There is no distension.     Palpations: Abdomen is soft.     Tenderness: There is no abdominal tenderness.  Skin:    General: Skin is warm and dry.  Neurological:     General: No focal deficit present.     Mental Status: She is alert. Mental status is at baseline.     Comments: Moving all extremities without difficulty, no slurred speech, no facial droop     Procedures  Procedures  ED Course / MDM    Medical Decision Making Amount and/or Complexity of Data Reviewed Labs: ordered. Radiology: ordered.  Risk Decision regarding hospitalization.   On reassessment of patient, patient's daughter at bedside expresses concerns about managing her UTI at home given his patient increased level of confusion.  She would prefer to have the patient admitted.  Advised that this is not unreasonable given patient's increasing confusion in the setting of UTI.  Will consult hospitalist for admission. CT head negative. Chest xray unremarkable. Unclear if current cause of symptoms is infectious related from possible UTI vs other  source.  Spoke with Dr. Hendrick Locke, hospitalist, who will be admitting patient.        Concetta Dee, PA-C 01/01/24 2036    Lind Repine, MD 01/02/24 2034

## 2024-01-01 NOTE — Subjective & Objective (Signed)
 Patient has dementia diagnosed 1 year ago and has been doing worse  Has not slept in 2 night, became more restless  Became more disoriented  Talking about children that were not present. No cough no fever

## 2024-01-01 NOTE — Assessment & Plan Note (Addendum)
 Obtain anemia panel  Transfuse for Hg <8  ( given CAD)  rapidly dropping or  if symptomatic

## 2024-01-01 NOTE — Progress Notes (Signed)
   Messaging with primary RN in ED. UA sample that was collected was from cath specimen. Even though lab labeled it as clean catch.  With pt not displaying any systemic systems(I.e. fever, chills, leukocytosis), 1 dose of po fosfomycin would be adequate treatment.  Alternatively, the [employer]) has also issued statements regarding not treating asymptomatic bacteruria.  However, given family complaints of foul smelling urine, I think it is reasonable to give her 1 dose of fosfomycin.  She does not require hospital admission given lack of objective systemic symptoms.  Unk Garb, DO [facility]

## 2024-01-01 NOTE — Telephone Encounter (Signed)
 Pt's daughter reports pt's symptoms are worsening, she did not sleep at all last night. Pt's daughter is asking if something can be called in to help pt sleep at night, please call.

## 2024-01-01 NOTE — Progress Notes (Signed)
 [facility]- [facility] Liaison Note:  This patient is currently enrolled in [facility] outpatient-based palliative care.   Please call for any outpatient based palliative care related questions or concerns.  Thank you, Glenna Fellows, BSN, RN, OCN [facility] Liaison [phone]

## 2024-01-01 NOTE — Assessment & Plan Note (Signed)
 Will hold INSULIN   Check CBG q 4 and correct as needed

## 2024-01-01 NOTE — Assessment & Plan Note (Signed)
-  chronic avoid nephrotoxic medications such as NSAIDs, Vanco Zosyn combo,  avoid hypotension, continue to follow renal function

## 2024-01-01 NOTE — Telephone Encounter (Signed)
 They went to the ED, will follow up on ED notes.

## 2024-01-01 NOTE — Assessment & Plan Note (Addendum)
 Chronic stable continue aspirin  81 mg a day Zetia  10 mg a day  toprol   100 mg a day and Pravachol  40 mg a day

## 2024-01-01 NOTE — Telephone Encounter (Signed)
 Call to daughter Adell Hones, she confirms patient was taken to ED due to worsening behavior and symptoms. She will let us  know when they leave and will inform Dr. Samara Crest. Daughter appreciative of call and support

## 2024-01-01 NOTE — ED Notes (Signed)
 Patient transported to CT

## 2024-01-01 NOTE — Telephone Encounter (Signed)
 Call to daughter who reports recently patient has been having hallucinations and they saw PCP last week and advised that she follow up with neurology. I asked if urine was tested fro UTI and she stated no. I advised to follow back up with PCP and have that ruled out first. She states patient was up all night last night with hallucinations and patient and family unable to sleep. Daughter is asking if something can be sent in to help with hallucinations until results of UTI come back.. Advised new referral may be needed if not related even though bad dreams are noted in previous visits. Daughter was appreciative of call

## 2024-01-01 NOTE — Assessment & Plan Note (Signed)
 In the setting of poor p.o. intake will rehydrate if persists will obtain further renal images. No back pain or abdominal pain to suggest renal abnormalities

## 2024-01-02 DIAGNOSIS — I2510 Atherosclerotic heart disease of native coronary artery without angina pectoris: Secondary | ICD-10-CM | POA: Diagnosis not present

## 2024-01-02 DIAGNOSIS — R82998 Other abnormal findings in urine: Secondary | ICD-10-CM | POA: Diagnosis not present

## 2024-01-02 DIAGNOSIS — N179 Acute kidney failure, unspecified: Secondary | ICD-10-CM | POA: Diagnosis not present

## 2024-01-02 DIAGNOSIS — D638 Anemia in other chronic diseases classified elsewhere: Secondary | ICD-10-CM | POA: Diagnosis not present

## 2024-01-02 DIAGNOSIS — F0390 Unspecified dementia without behavioral disturbance: Secondary | ICD-10-CM | POA: Diagnosis not present

## 2024-01-02 DIAGNOSIS — G9340 Encephalopathy, unspecified: Secondary | ICD-10-CM | POA: Diagnosis not present

## 2024-01-02 DIAGNOSIS — I5032 Chronic diastolic (congestive) heart failure: Secondary | ICD-10-CM | POA: Diagnosis not present

## 2024-01-02 LAB — COMPREHENSIVE METABOLIC PANEL WITH GFR
ALT: 8 U/L (ref 0–44)
AST: 19 U/L (ref 15–41)
Albumin: 2.8 g/dL — ABNORMAL LOW (ref 3.5–5.0)
Alkaline Phosphatase: 71 U/L (ref 38–126)
Anion gap: 14 (ref 5–15)
BUN: 22 mg/dL (ref 8–23)
CO2: 29 mmol/L (ref 22–32)
Calcium: 9.4 mg/dL (ref 8.9–10.3)
Chloride: 95 mmol/L — ABNORMAL LOW (ref 98–111)
Creatinine, Ser: 1.91 mg/dL — ABNORMAL HIGH (ref 0.44–1.00)
GFR, Estimated: 25 mL/min — ABNORMAL LOW (ref >60)
Glucose, Bld: 113 mg/dL — ABNORMAL HIGH (ref 70–99)
Potassium: 3.4 mmol/L — ABNORMAL LOW (ref 3.5–5.1)
Sodium: 138 mmol/L (ref 135–145)
Total Bilirubin: 0.7 mg/dL (ref 0.0–1.2)
Total Protein: 7.2 g/dL (ref 6.5–8.1)

## 2024-01-02 LAB — MAGNESIUM: Magnesium: 1.9 mg/dL (ref 1.7–2.4)

## 2024-01-02 LAB — GLUCOSE, CAPILLARY
Glucose-Capillary: 104 mg/dL — ABNORMAL HIGH (ref 70–99)
Glucose-Capillary: 107 mg/dL — ABNORMAL HIGH (ref 70–99)
Glucose-Capillary: 109 mg/dL — ABNORMAL HIGH (ref 70–99)
Glucose-Capillary: 123 mg/dL — ABNORMAL HIGH (ref 70–99)
Glucose-Capillary: 127 mg/dL — ABNORMAL HIGH (ref 70–99)
Glucose-Capillary: 81 mg/dL (ref 70–99)
Glucose-Capillary: 97 mg/dL (ref 70–99)
Glucose-Capillary: 98 mg/dL (ref 70–99)
Glucose-Capillary: 98 mg/dL (ref 70–99)

## 2024-01-02 LAB — URINALYSIS, W/ REFLEX TO CULTURE (INFECTION SUSPECTED)
Bilirubin Urine: NEGATIVE
Glucose, UA: NEGATIVE mg/dL
Hgb urine dipstick: NEGATIVE
Ketones, ur: NEGATIVE mg/dL
Nitrite: NEGATIVE
Protein, ur: NEGATIVE mg/dL
Specific Gravity, Urine: 1.01 (ref 1.005–1.030)
pH: 5 (ref 5.0–8.0)

## 2024-01-02 LAB — CBC
HCT: 38.2 % (ref 36.0–46.0)
Hemoglobin: 11.7 g/dL — ABNORMAL LOW (ref 12.0–15.0)
MCH: 25.7 pg — ABNORMAL LOW (ref 26.0–34.0)
MCHC: 30.6 g/dL (ref 30.0–36.0)
MCV: 84 fL (ref 80.0–100.0)
Platelets: 244 10*3/uL (ref 150–400)
RBC: 4.55 MIL/uL (ref 3.87–5.11)
RDW: 15 % (ref 11.5–15.5)
WBC: 6.1 10*3/uL (ref 4.0–10.5)
nRBC: 0 % (ref 0.0–0.2)

## 2024-01-02 LAB — VITAMIN B12: Vitamin B-12: 646 pg/mL (ref 180–914)

## 2024-01-02 LAB — TSH: TSH: 2.323 u[IU]/mL (ref 0.350–4.500)

## 2024-01-02 LAB — FOLATE: Folate: 6.4 ng/mL (ref >5.9)

## 2024-01-02 LAB — PHOSPHORUS: Phosphorus: 3.3 mg/dL (ref 2.5–4.6)

## 2024-01-02 MED ORDER — TAMSULOSIN HCL 0.4 MG PO CAPS
0.4000 mg | ORAL_CAPSULE | Freq: Every day | ORAL | Status: DC
  Administered 2024-01-02 – 2024-01-05 (×4): 0.4 mg via ORAL
  Filled 2024-01-02 (×4): qty 1

## 2024-01-02 MED ORDER — SODIUM CHLORIDE 0.9 % IV SOLN
1.0000 g | INTRAVENOUS | Status: DC
  Administered 2024-01-02 – 2024-01-05 (×4): 1 g via INTRAVENOUS
  Filled 2024-01-02 (×5): qty 10

## 2024-01-02 NOTE — Progress Notes (Signed)
 Events this Shift:  No urine output noted. Patient bladder scanned twice, results 1. 624, 2. 532. On call MD notified. Orders received to in/out cath X1. This task completed and output was 650cc of clear yellow urine. Urine sample sent as ordered. Peri care performed before and after procedure. Patient tolerated well. Patient DTV @ 08-1399. Will continue to monitor this shift.

## 2024-01-02 NOTE — Progress Notes (Addendum)
 Transition of Care [facility]) - Inpatient Brief Assessment   Patient Details  Name: Jeanne [name] MRN: [id number] Date of Birth: December 20, 1935  [facility] CM/SW Contact:    Dane Dung, RN Phone Number: 01/02/2024, 4:54 PM   Clinical Narrative: CM met with the patient and daughter, Adell Hones at the bedside to discuss TOC needs for the patient.  The patient lives at home with her daughters and plans to return home when medically stable for discharge.  DME at the home includes Jeananne Mighty, Museum/gallery exhibitions officer, Shower seat, 3:1, adjustable bed and wheelchair.  Patient's daughter was offered Medicare choice regarding home health but daughter declined at this time.  She did state that she wanted to be sure Outpatient Palliative Care follow up was placed with [facility].  I sent a message to Ardine Beckwith, CM with [facility] and placed the referral.  No other TOC needs at this time.  When patient is stable for discharge - daughters plans to transport the patient home by car,   Transition of Care Asessment: Insurance and Status: (P) Insurance coverage has been reviewed Patient has primary care physician: (P) Yes Home environment has been reviewed: (P) from home with daughter's Prior level of function:: (P) Rolator Prior/Current Home Services: (P) No current home services Social Drivers of Health Review: (P) SDOH reviewed interventions complete Readmission risk has been reviewed: (P) Yes Transition of care needs: (P) transition of care needs identified, TOC will continue to follow

## 2024-01-02 NOTE — Care Management Obs Status (Cosign Needed)
 MEDICARE OBSERVATION STATUS NOTIFICATION   Patient Details  Name: Jeanne [name] MRN: [id number] Date of Birth: 1936-05-18   Medicare Observation Status Notification Given:  Yes    Dane Dung, RN 01/02/2024, 3:37 PM

## 2024-01-02 NOTE — Care Management Obs Status (Signed)
 MEDICARE OBSERVATION STATUS NOTIFICATION   Patient Details  Name: Jeanne [name] MRN: [id number] Date of Birth: July 07, 1936   Medicare Observation Status Notification Given:     Moon letter sign and copy given  Wynonia Hedges 01/02/2024, 10:11 AM

## 2024-01-02 NOTE — Evaluation (Signed)
 Occupational Therapy Evaluation Patient Details Name: Jeanne [name] MRN: [id number] DOB: 09-26-1935 Today's Date: 01/02/2024   History of Present Illness   Pt is 88 year old presented to [facility] on  01/01/24 for worsening confusion. Acute metabolic encephalopathy likely due to dehydration, AKI, and possible UTI. PMH - rt femur ORIF, dementia, HTN, CAD, hyperlipidemia, DMT2, GERD, HF, CKD 3B.     Clinical Impressions Pt has assist at baseline from family for ADLs, ambulates short distances at home with RW, lives with daughter. Pt currently needing up to max A for ADLs, mod +2 for bed mobility and mod +2 for transfers with RW. Pt able to take a few lateral side steps to R toward HOB. Pt presenting with impairments listed below, will follow acutely. Recommend HHOT at d/c given pt has assist from family.     If plan is discharge home, recommend the following:   A lot of help with walking and/or transfers;A lot of help with bathing/dressing/bathroom;Assistance with cooking/housework;Direct supervision/assist for medications management;Direct supervision/assist for financial management;Assist for transportation;Help with stairs or ramp for entrance     Functional Status Assessment   Patient has had a recent decline in their functional status and demonstrates the ability to make significant improvements in function in a reasonable and predictable amount of time.     Equipment Recommendations   None recommended by OT     Recommendations for Other Services   PT consult     Precautions/Restrictions   Precautions Precautions: Fall Recall of Precautions/Restrictions: Impaired Restrictions Weight Bearing Restrictions Per Provider Order: No     Mobility Bed Mobility Overal bed mobility: Needs Assistance Bed Mobility: Supine to Sit, Sit to Sidelying     Supine to sit: +2 for physical assistance, Mod assist   Sit to sidelying: +2 for physical assistance, Mod assist       Transfers Overall transfer level: Needs assistance Equipment used: Rolling walker (2 wheels) Transfers: Sit to/from Stand Sit to Stand: +2 physical assistance, Mod assist           General transfer comment: Assist to power up      Balance Overall balance assessment: Needs assistance Sitting-balance support: Bilateral upper extremity supported, Feet supported, No upper extremity supported Sitting balance-Leahy Scale: Fair     Standing balance support: Bilateral upper extremity supported, During functional activity, Reliant on assistive device for balance Standing balance-Leahy Scale: Poor Standing balance comment: walker and min assist for static standing                           ADL either performed or assessed with clinical judgement   ADL Overall ADL's : Needs assistance/impaired Eating/Feeding: Set up;Sitting   Grooming: Set up;Sitting   Upper Body Bathing: Moderate assistance   Lower Body Bathing: Maximal assistance   Upper Body Dressing : Moderate assistance   Lower Body Dressing: Moderate assistance   Toilet Transfer: Moderate assistance;+2 for physical assistance   Toileting- Clothing Manipulation and Hygiene: Total assistance       Functional mobility during ADLs: Moderate assistance;+2 for physical assistance       Vision   Vision Assessment?: No apparent visual deficits     Perception Perception: Not tested       Praxis Praxis: Not tested       Pertinent Vitals/Pain Pain Assessment Pain Assessment: No/denies pain     Extremity/Trunk Assessment Upper Extremity Assessment Upper Extremity Assessment: Generalized weakness   Lower Extremity Assessment Lower  Extremity Assessment: Defer to PT evaluation   Cervical / Trunk Assessment Cervical / Trunk Assessment: Kyphotic   Communication Communication Communication: No apparent difficulties   Cognition Arousal: Alert Behavior During Therapy: Flat affect                OT - Cognition Comments: hx dementia                 Following commands: Impaired Following commands impaired: Only follows one step commands consistently     Cueing  General Comments   Cueing Techniques: Verbal cues;Tactile cues  VSS   Exercises     Shoulder Instructions      Home Living Family/patient expects to be discharged to:: Private residence Living Arrangements: Children Available Help at Discharge: Family;Available 24 hours/day Type of Home: House Home Access: Level entry     Home Layout: One level     Bathroom Shower/Tub: Producer, television/film/video: Standard Bathroom Accessibility: Yes   Home Equipment: Grab bars - tub/shower;Cane - quad;Rollator (4 wheels);Shower seat;BSC/3in1;Other (comment) (adjustable bed)          Prior Functioning/Environment Prior Level of Function : Needs assist  Cognitive Assist : Mobility (cognitive);ADLs (cognitive) Mobility (Cognitive): Intermittent cues ADLs (Cognitive): Intermittent cues Physical Assist : Mobility (physical);ADLs (physical) Mobility (physical): Bed mobility;Transfers;Gait ADLs (physical): Grooming;Bathing;Dressing;Toileting;IADLs Mobility Comments: Amb short household distances with assist intermittently. Prefers to stay in bed. Family assisted to EOB if pt declined OOB ADLs Comments: daughter assists with bathing/dressing, IADLs    OT Problem List: Decreased strength;Decreased range of motion;Decreased activity tolerance;Impaired balance (sitting and/or standing);Decreased safety awareness;Decreased cognition   OT Treatment/Interventions: Self-care/ADL training;Therapeutic exercise;Energy conservation;DME and/or AE instruction;Therapeutic activities;Patient/family education;Balance training      OT Goals(Current goals can be found in the care plan section)   Acute Rehab OT Goals Patient Stated Goal: none stated OT Goal Formulation: With patient Time For Goal Achievement:  01/16/24 Potential to Achieve Goals: Good ADL Goals Pt Will Perform Upper Body Dressing: sitting;with min assist Pt Will Perform Lower Body Dressing: sitting/lateral leans;sit to/from stand;with min assist;with adaptive equipment Pt Will Transfer to Toilet: ambulating;regular height toilet;with supervision Pt Will Perform Tub/Shower Transfer: Tub transfer;Shower transfer;ambulating;with supervision   OT Frequency:  Min 2X/week    Co-evaluation PT/OT/SLP Co-Evaluation/Treatment: Yes Reason for Co-Treatment: For patient/therapist safety PT goals addressed during session: Mobility/safety with mobility OT goals addressed during session: ADL's and self-care;Strengthening/ROM      AM-PAC OT "6 Clicks" Daily Activity     Outcome Measure Help from another person eating meals?: A Little Help from another person taking care of personal grooming?: A Little Help from another person toileting, which includes using toliet, bedpan, or urinal?: A Lot Help from another person bathing (including washing, rinsing, drying)?: A Lot Help from another person to put on and taking off regular upper body clothing?: A Lot Help from another person to put on and taking off regular lower body clothing?: A Lot 6 Click Score: 14   End of Session Equipment Utilized During Treatment: Rolling walker (2 wheels);Gait belt Nurse Communication: Mobility status  Activity Tolerance: Patient tolerated treatment well Patient left: in bed;with call bell/phone within reach;with bed alarm set;with family/visitor present  OT Visit Diagnosis: Unsteadiness on feet (R26.81);Other abnormalities of gait and mobility (R26.89);Muscle weakness (generalized) (M62.81)                Time: 1412-1430 OT Time Calculation (min): 18 min Charges:  OT General Charges $OT Visit: 1 Visit OT Evaluation $  OT Eval Moderate Complexity: 1 Mod  Brookie Wayment K, OTD, OTR/L SecureChat Preferred Acute Rehab [phone]   Benedict Brain  Koonce 01/02/2024, 4:20 PM

## 2024-01-02 NOTE — Progress Notes (Signed)
 Assigned RN performed BS check on 01/02/2024 @2107  the result was 136. This result did not download into computer.

## 2024-01-02 NOTE — Plan of Care (Signed)

## 2024-01-02 NOTE — Evaluation (Signed)
 Physical Therapy Evaluation Patient Details Name: Jeanne [name] MRN: [id number] DOB: October 03, 1935 Today's Date: 01/02/2024  History of Present Illness  Pt is 88 year old presented to [facility] on  01/01/24 for worsening confusion. Acute metabolic encephalopathy likely due to dehydration, AKI, and possible UTI. PMH - rt femur ORIF, dementia, HTN, CAD, hyperlipidemia, DMT2, GERD, HF, CKD 3B.  Clinical Impression  Pt admitted with above diagnosis and presents to PT with functional limitations due to deficits listed below (See PT problem list). Pt needs skilled PT to maximize independence and safety. Pt has been at home with daughters acting as caregivers. Per daughter pt has been able to amb short household distances with rollator and assist but most of the time prefers to stay in bed. On those days daughters at least have her sit EOB several times. Recommend return home with home health/palliative support.           If plan is discharge home, recommend the following: Assistance with cooking/housework;Assist for transportation;A lot of help with bathing/dressing/bathroom;A lot of help with walking and/or transfers   Can travel by private vehicle        Equipment Recommendations None recommended by PT  Recommendations for Other Services       Functional Status Assessment Patient has had a recent decline in their functional status and demonstrates the ability to make significant improvements in function in a reasonable and predictable amount of time.     Precautions / Restrictions Precautions Precautions: Fall Recall of Precautions/Restrictions: Impaired Restrictions Weight Bearing Restrictions Per Provider Order: No      Mobility  Bed Mobility Overal bed mobility: Needs Assistance Bed Mobility: Supine to Sit, Sit to Sidelying     Supine to sit: +2 for physical assistance, Mod assist   Sit to sidelying: +2 for physical assistance, Mod assist      Transfers Overall transfer level:  Needs assistance Equipment used: Rolling walker (2 wheels) Transfers: Sit to/from Stand Sit to Stand: +2 physical assistance, Mod assist           General transfer comment: Assist to power up    Ambulation/Gait Ambulation/Gait assistance: Min assist, +2 safety/equipment Gait Distance (Feet): 3 Feet Assistive device: Rolling walker (2 wheels) Gait Pattern/deviations: Step-to pattern, Decreased step length - right, Decreased step length - left Gait velocity: decr Gait velocity interpretation: <1.31 ft/sec, indicative of household ambulator   General Gait Details: Side stepped up side of bed  Stairs            Wheelchair Mobility     Tilt Bed    Modified Rankin (Stroke Patients Only)       Balance Overall balance assessment: Needs assistance Sitting-balance support: Bilateral upper extremity supported, Feet supported, No upper extremity supported Sitting balance-Leahy Scale: Fair     Standing balance support: Bilateral upper extremity supported, During functional activity, Reliant on assistive device for balance Standing balance-Leahy Scale: Poor Standing balance comment: walker and min assist for static standing                             Pertinent Vitals/Pain Pain Assessment Pain Assessment: Faces Faces Pain Scale: No hurt    Home Living Family/patient expects to be discharged to:: Private residence Living Arrangements: Children Available Help at Discharge: Family;Available 24 hours/day Type of Home: House Home Access: Level entry       Home Layout: One level Home Equipment: Grab bars - tub/shower;Cane - quad;Rollator (4  wheels);Shower seat;BSC/3in1;Other (comment) (adjustable bed)      Prior Function Prior Level of Function : Needs assist  Cognitive Assist : Mobility (cognitive);ADLs (cognitive) Mobility (Cognitive): Intermittent cues ADLs (Cognitive): Intermittent cues Physical Assist : Mobility (physical);ADLs (physical) Mobility  (physical): Bed mobility;Transfers;Gait ADLs (physical): Grooming;Bathing;Dressing;Toileting;IADLs Mobility Comments: Amb short household distances with assist intermittently. Prefers to stay in bed. Family assisted to EOB if pt declined OOB       Extremity/Trunk Assessment   Upper Extremity Assessment Upper Extremity Assessment: Defer to OT evaluation    Lower Extremity Assessment Lower Extremity Assessment: Generalized weakness       Communication   Communication Communication: No apparent difficulties    Cognition Arousal: Alert Behavior During Therapy: Flat affect   PT - Cognitive impairments: History of cognitive impairments                         Following commands: Impaired Following commands impaired: Only follows one step commands consistently     Cueing Cueing Techniques: Verbal cues, Tactile cues     General Comments General comments (skin integrity, edema, etc.): VSS on RA    Exercises     Assessment/Plan    PT Assessment Patient needs continued PT services  PT Problem List Decreased strength;Decreased activity tolerance;Decreased balance;Decreased mobility;Decreased safety awareness;Obesity       PT Treatment Interventions DME instruction;Gait training;Functional mobility training;Therapeutic activities;Therapeutic exercise;Balance training;Patient/family education    PT Goals (Current goals can be found in the Care Plan section)  Acute Rehab PT Goals Patient Stated Goal: daughter would like pt to return home with palliative care at dc PT Goal Formulation: With family Time For Goal Achievement: 01/16/24 Potential to Achieve Goals: Fair    Frequency Min 2X/week     Co-evaluation PT/OT/SLP Co-Evaluation/Treatment: Yes Reason for Co-Treatment: For patient/therapist safety PT goals addressed during session: Mobility/safety with mobility         AM-PAC PT "6 Clicks" Mobility  Outcome Measure Help needed turning from your back to  your side while in a flat bed without using bedrails?: Total Help needed moving from lying on your back to sitting on the side of a flat bed without using bedrails?: Total Help needed moving to and from a bed to a chair (including a wheelchair)?: Total Help needed standing up from a chair using your arms (e.g., wheelchair or bedside chair)?: Total Help needed to walk in hospital room?: Total Help needed climbing 3-5 steps with a railing? : Total 6 Click Score: 6    End of Session Equipment Utilized During Treatment: Gait belt Activity Tolerance: Patient limited by fatigue Patient left: in bed;with call bell/phone within reach;with bed alarm set;with family/visitor present Nurse Communication: Mobility status PT Visit Diagnosis: Other abnormalities of gait and mobility (R26.89);Muscle weakness (generalized) (M62.81)    Time: 1413-1430 PT Time Calculation (min) (ACUTE ONLY): 17 min   Charges:   PT Evaluation $PT Eval Moderate Complexity: 1 Mod   PT General Charges $$ ACUTE PT VISIT: 1 Visit         [facility] PT Acute Rehabilitation Services Office [phone]   [facility] 01/02/2024, 3:47 PM

## 2024-01-02 NOTE — Progress Notes (Signed)
 [facility]                                                                              Jeanne [name], is a 88 y.o. female, DOB - 02-28-1936, ION:[id number] Admit date - 01/01/2024    Outpatient Primary MD for the patient is Merl Star, MD  LOS - 0  days  Chief Complaint  Patient presents with   Malodorous Urine       Brief summary   Patient is a 88 year old female with dementia, CAD, DM type II, CKD stage IV, HLD, MGUS, chronic diastolic CHF presented with worsening confusion and decreased p.o. intake.  Patient has been diagnosed with dementia a year ago and has been getting worse, has not slept in 2 nights, more restless and disoriented with hallucinations, talking about children that were not present.  No cough or fever.   Assessment & Plan    Principal Problem:   Acute metabolic encephalopathy superimposed on dementia -Likely due to dehydration, AKI, may have underlying UTI, UA showing bacteriuria but will send the urine culture - KUB with no ileus or obstruction, chest x-ray negative for any infiltrates, no leukocytosis - Will place on IV Rocephin  for 3 days to assess for any improvement - Check B12, folate, TSH   Active Problems: Acute kidney injury superimposed on CKD stage IIIb - Baseline creatinine 1.4-1.6, has been up to 1.9 in the past as well - Presented with creatinine of 2.43, placed on gentle hydration  Acute urinary retention - Bladder scan twice with urinary retention, In-N-Out cath x 1 with output of 650 cc - Placed on Flomax for 7 days - If needs In-N-Out cath again, will place Foley catheter - Follow urine culture, continue IV Rocephin  for 3 days to assess any improvement    Anemia of chronic disease, CKD, normocytic -H&H currently stable - Anemia panel revealed    DM2 (diabetes mellitus, type 2) (HCC), IDDM -Continue sliding scale insulin  while inpatient Hemoglobin A1c 6.9 CBG (last 3)  Recent Labs    01/02/24 0616  01/02/24 0808 01/02/24 1019  GLUCAP 104* 97 81     Coronary artery disease involving native coronary artery of native heart without angina pectoris -Currently no acute chest pain or shortness of breath, continue aspirin , Zetia , Toprol , Pravachol   Obesity class III, morbid obesity Estimated body mass index is 51.49 kg/m as calculated from the following:   Height as of this encounter: 5\' 4"  (1.626 m).   Weight as of this encounter: 136.1 kg.  Code Status: DNR DVT Prophylaxis:  SCDs Start: 01/01/24 2111   Level of Care: Level of care: Telemetry Medical Family Communication:  Disposition Plan:      Remains inpatient appropriate: Will likely need higher level of care, palliative medicine consulted for GOC   Procedures:    Consultants:   Palliative medicine  Antimicrobials:   Anti-infectives (From admission, onward)    Start     Dose/Rate Route Frequency Ordered Stop   01/02/24 1100  cefTRIAXone  (ROCEPHIN ) 1 g in sodium chloride  0.9 % 100 mL IVPB        1 g 200 mL/hr over 30 Minutes  Intravenous Every 24 hours 01/02/24 1009     01/01/24 1730  cefTRIAXone  (ROCEPHIN ) 1 g in sodium chloride  0.9 % 100 mL IVPB  Status:  Discontinued        1 g 200 mL/hr over 30 Minutes Intravenous  Once 01/01/24 1716 01/01/24 1731          Medications  amLODipine   5 mg Oral Daily   aspirin  EC  81 mg Oral Daily   dextrose   25 g Intravenous STAT   docusate sodium   100 mg Oral BID   ezetimibe   10 mg Oral Daily   ferrous sulfate   325 mg Oral QHS   isosorbide  mononitrate  60 mg Oral Daily   metoprolol  succinate  100 mg Oral Daily   pantoprazole   40 mg Oral Daily   pravastatin   40 mg Oral Daily   senna  1 tablet Oral BID      Subjective:   Jeanne [name] was seen and examined today.  Pleasantly confused, oriented to herself.  No acute pain, shortness of breath, fevers or chills.  Difficult to obtain ROS from the patient.  No acute events overnight.  Urinary retention this  morning  Objective:   Vitals:   01/02/24 0007 01/02/24 0357 01/02/24 0358 01/02/24 0808  BP: (!) 113/58 132/80 132/80 (!) 144/92  Pulse: 84 82 81 92  Resp: 18 18 18    Temp: 98.3 F (36.8 C) 98.4 F (36.9 C) 98.4 F (36.9 C) 99.1 F (37.3 C)  TempSrc: Oral Oral Oral   SpO2: 100%  98% 94%  Weight:      Height:        Intake/Output Summary (Last 24 hours) at 01/02/2024 1020 Last data filed at 01/02/2024 0618 Gross per 24 hour  Intake 1000 ml  Output 650 ml  Net 350 ml     Wt Readings from Last 3 Encounters:  01/01/24 136.1 kg  11/01/23 (!) 146.1 kg  08/30/23 (!) 139 kg     Exam General: Alert and oriented x self NAD, Cardiovascular: S1 S2 auscultated,  RRR Respiratory: Clear to auscultation bilaterally, no wheezing Gastrointestinal: Soft, nontender, nondistended, + bowel sounds Ext: no pedal edema bilaterally Neuro: moving all 4 extremities Psych: confused    Data Reviewed:  I have personally reviewed following labs    CBC Lab Results  Component Value Date   WBC 7.4 01/01/2024   RBC 4.76 01/01/2024   HGB 12.0 01/01/2024   HCT 39.4 01/01/2024   MCV 84.9 01/01/2024   MCH 25.9 (L) 01/01/2024   PLT 247 01/01/2024   MCHC 30.5 01/01/2024   RDW 14.8 01/01/2024   LYMPHSABS 1.3 11/01/2023   MONOABS 0.7 11/01/2023   EOSABS 0.4 11/01/2023   BASOSABS 0.1 11/01/2023     Last metabolic panel Lab Results  Component Value Date   NA 135 01/01/2024   K 3.7 01/01/2024   CL 91 (L) 01/01/2024   CO2 29 01/01/2024   BUN 28 (H) 01/01/2024   CREATININE 2.43 (H) 01/01/2024   GLUCOSE 88 01/01/2024   GFRNONAA 19 (L) 01/01/2024   GFRAA 34 (L) 01/27/2020   CALCIUM  9.6 01/01/2024   PHOS 3.6 09/02/2023   PROT 7.5 01/01/2024   ALBUMIN 3.0 (L) 01/01/2024   LABGLOB 3.1 01/27/2020   BILITOT 0.8 01/01/2024   ALKPHOS 76 01/01/2024   AST 20 01/01/2024   ALT 6 01/01/2024   ANIONGAP 15 01/01/2024    CBG (last 3)  Recent Labs    01/02/24 0354 01/02/24 0616  01/02/24  0808  GLUCAP 98 104* 97      Coagulation Profile: No results for input(s): "INR", "PROTIME" in the last 168 hours.   Radiology Studies: I have personally reviewed the imaging studies  DG Abd 1 View Result Date: 01/01/2024 CLINICAL DATA:  Constipation EXAM: ABDOMEN - 1 VIEW COMPARISON:  None Available. FINDINGS: The bowel gas pattern is normal. Small to moderate stool burden the ascending colon. Relatively decompressed distal colon and rectum. No radio-opaque calculi or other significant radiographic abnormality are seen. IMPRESSION: 1. Nonobstructive bowel gas pattern. Electronically Signed   By: Worthy Heads M.D.   On: 01/01/2024 21:35   DG Chest Portable 1 View Result Date: 01/01/2024 EXAM: 1 VIEW XRAY OF THE CHEST 01/01/2024 07:28:38 PM COMPARISON: 06/09/2020 CLINICAL HISTORY: AMS. Patient BIB [facility] from home for malodorous urine and concerns for UTI. Patient lives with family, family called other family who called PCP, PCP recommended send out. Patient has hx of dementia with agitation and hallucinations, A\T\Ox2 at baseline. FINDINGS: LUNGS AND PLEURA: No consolidation. No pulmonary edema. No pleural effusion. No pneumothorax. HEART AND MEDIASTINUM: No acute abnormality of the cardiac and mediastinal silhouettes. Thoracic aortic atherosclerosis. BONES AND SOFT TISSUES: No acute osseous abnormality. IMPRESSION: 1. No acute findings. Electronically signed by: Zadie Herter MD 01/01/2024 07:32 PM EDT RP Workstation: [id number]   CT Head Wo Contrast Result Date: 01/01/2024 CLINICAL DATA:  Mental status change, unknown cause malodorous urine and concerns for UTI. Patient lives with family, family called other family who called PCP, PCP recommended send out. EXAM: CT HEAD WITHOUT CONTRAST TECHNIQUE: Contiguous axial images were obtained from the base of the skull through the vertex without intravenous contrast. RADIATION DOSE REDUCTION: This exam was performed according to the  departmental dose-optimization program which includes automated exposure control, adjustment of the mA and/or kV according to patient size and/or use of iterative reconstruction technique. COMPARISON:  CT head 09/25/2023. FINDINGS: Brain: Patchy and confluent areas of decreased attenuation are noted throughout the deep and periventricular white matter of the cerebral hemispheres bilaterally, compatible with chronic microvascular ischemic disease. No evidence of large-territorial acute infarction. No parenchymal hemorrhage. No mass lesion. No extra-axial collection. No mass effect or midline shift. No hydrocephalus. Basilar cisterns are patent. Partial empty sella. Vascular: No hyperdense vessel. Atherosclerotic calcifications are present within the cavernous internal carotid arteries. Skull: No acute fracture or focal lesion. Sinuses/Orbits: Paranasal sinuses and mastoid air cells are clear. Bilateral lens replacement. Otherwise the orbits are unremarkable. Other: None. IMPRESSION: 1. No acute intracranial abnormality. 2. Partial empty sella. Findings is often a normal anatomic variant but can be associated with idiopathic intracranial hypertension (pseudotumor cerebri). Electronically Signed   By: Morgane  Naveau M.D.   On: 01/01/2024 18:22       Juandedios Dudash M.D. [facility] 01/02/2024, 10:20 AM  Available via Epic secure chat 7am-7pm After 7 pm, please refer to night coverage provider listed on amion.

## 2024-01-03 DIAGNOSIS — G9340 Encephalopathy, unspecified: Secondary | ICD-10-CM | POA: Diagnosis not present

## 2024-01-03 DIAGNOSIS — Z7189 Other specified counseling: Secondary | ICD-10-CM | POA: Diagnosis not present

## 2024-01-03 DIAGNOSIS — D638 Anemia in other chronic diseases classified elsewhere: Secondary | ICD-10-CM | POA: Diagnosis not present

## 2024-01-03 DIAGNOSIS — F03B11 Unspecified dementia, moderate, with agitation: Secondary | ICD-10-CM

## 2024-01-03 DIAGNOSIS — Z515 Encounter for palliative care: Secondary | ICD-10-CM

## 2024-01-03 DIAGNOSIS — N179 Acute kidney failure, unspecified: Secondary | ICD-10-CM | POA: Diagnosis not present

## 2024-01-03 DIAGNOSIS — Z66 Do not resuscitate: Secondary | ICD-10-CM

## 2024-01-03 DIAGNOSIS — I5032 Chronic diastolic (congestive) heart failure: Secondary | ICD-10-CM | POA: Diagnosis not present

## 2024-01-03 LAB — BASIC METABOLIC PANEL WITH GFR
Anion gap: 14 (ref 5–15)
BUN: 22 mg/dL (ref 8–23)
CO2: 28 mmol/L (ref 22–32)
Calcium: 9 mg/dL (ref 8.9–10.3)
Chloride: 97 mmol/L — ABNORMAL LOW (ref 98–111)
Creatinine, Ser: 1.99 mg/dL — ABNORMAL HIGH (ref 0.44–1.00)
GFR, Estimated: 24 mL/min — ABNORMAL LOW (ref >60)
Glucose, Bld: 111 mg/dL — ABNORMAL HIGH (ref 70–99)
Potassium: 3.4 mmol/L — ABNORMAL LOW (ref 3.5–5.1)
Sodium: 139 mmol/L (ref 135–145)

## 2024-01-03 LAB — GLUCOSE, CAPILLARY
Glucose-Capillary: 104 mg/dL — ABNORMAL HIGH (ref 70–99)
Glucose-Capillary: 105 mg/dL — ABNORMAL HIGH (ref 70–99)
Glucose-Capillary: 124 mg/dL — ABNORMAL HIGH (ref 70–99)
Glucose-Capillary: 126 mg/dL — ABNORMAL HIGH (ref 70–99)
Glucose-Capillary: 130 mg/dL — ABNORMAL HIGH (ref 70–99)
Glucose-Capillary: 133 mg/dL — ABNORMAL HIGH (ref 70–99)
Glucose-Capillary: 136 mg/dL — ABNORMAL HIGH (ref 70–99)

## 2024-01-03 MED ORDER — METOPROLOL SUCCINATE ER 50 MG PO TB24
75.0000 mg | ORAL_TABLET | Freq: Every day | ORAL | Status: DC
  Administered 2024-01-04 – 2024-01-06 (×3): 75 mg via ORAL
  Filled 2024-01-03 (×3): qty 1

## 2024-01-03 MED ORDER — LACTATED RINGERS IV SOLN: INTRAVENOUS | Status: AC

## 2024-01-03 NOTE — Progress Notes (Signed)
 [facility]                                                                              Jeanne [name], is a 88 y.o. female, DOB - 03-Apr-1936, ZOX:[id number] Admit date - 01/01/2024    Outpatient Primary MD for the patient is Merl Star, MD  LOS - 0  days  Chief Complaint  Patient presents with   Malodorous Urine       Brief summary   Patient is a 88 year old female with dementia, CAD, DM type II, CKD stage IV, HLD, MGUS, chronic diastolic CHF presented with worsening confusion and decreased p.o. intake.  Patient has been diagnosed with dementia a year ago and has been getting worse, has not slept in 2 nights, more restless and disoriented with hallucinations, talking about children that were not present.  No cough or fever.   Assessment & Plan    Principal Problem:   Acute metabolic encephalopathy superimposed on dementia -Likely due to dehydration, AKI, may have underlying UTI, UA showing bacteriuria but will send the urine culture - KUB with no ileus or obstruction, chest x-ray negative for any infiltrates, no leukocytosis - Will place on IV Rocephin  for 3 days to assess for any improvement - B12, folate, TSH normal    Active Problems: Acute kidney injury superimposed on CKD stage IIIb - Baseline creatinine 1.4-1.6, has been up to 1.9 in the past as well - Presented with creatinine of 2.43 - Creatinine improving, 1.9, continue IV fluid hydration  Acute urinary retention - Noted to have acute urinary retention on 4/30, Foley catheter placed.   - Continue Flomax  X 7 days     Anemia of chronic disease, CKD, normocytic -H&H currently stable    DM2 (diabetes mellitus, type 2) (HCC), IDDM -Continue sliding scale insulin  while inpatient Hemoglobin A1c 6.9 CBG (last 3)  Recent Labs    01/03/24 0605 01/03/24 0809 01/03/24 1131  GLUCAP 105* 126* 133*     Coronary artery disease involving native coronary artery of native heart without angina  pectoris -Currently no acute chest pain or shortness of breath, continue aspirin , Zetia , Toprol , Pravachol   Hypertension - BP currently soft, discontinue amlodipine  - continue Toprol -XL for now, decreased to 75 mg daily  Obesity class III, morbid obesity Estimated body mass index is 51.49 kg/m as calculated from the following:   Height as of this encounter: 5\' 4"  (1.626 m).   Weight as of this encounter: 136.1 kg.  Code Status: DNR DVT Prophylaxis:  SCDs Start: 01/01/24 2111   Level of Care: Level of care: Telemetry Medical Family Communication: Updated patient's daughter on the phone yesterday Disposition Plan:      Remains inpatient appropriate: Will likely need higher level of care, palliative medicine consulted for GOC   Procedures:    Consultants:   Palliative medicine  Antimicrobials:   Anti-infectives (From admission, onward)    Start     Dose/Rate Route Frequency Ordered Stop   01/02/24 1100  cefTRIAXone  (ROCEPHIN ) 1 g in sodium chloride  0.9 % 100 mL IVPB        1 g 200 mL/hr over 30 Minutes Intravenous  Every 24 hours 01/02/24 1009     01/01/24 1730  cefTRIAXone  (ROCEPHIN ) 1 g in sodium chloride  0.9 % 100 mL IVPB  Status:  Discontinued        1 g 200 mL/hr over 30 Minutes Intravenous  Once 01/01/24 1716 01/01/24 1731          Medications  amLODipine   5 mg Oral Daily   aspirin  EC  81 mg Oral Daily   docusate sodium   100 mg Oral BID   ezetimibe   10 mg Oral Daily   ferrous sulfate   325 mg Oral QHS   metoprolol  succinate  100 mg Oral Daily   pantoprazole   40 mg Oral Daily   pravastatin   40 mg Oral Daily   senna  1 tablet Oral BID   tamsulosin   0.4 mg Oral Daily      Subjective:   Jeanne [name] was seen and examined today.  Somnolent but easily arousable.  No acute complaints.  No acute issues overnight.  No chest pain, shortness of breath, fever chills, nausea or vomiting.  Objective:   Vitals:   01/03/24 0451 01/03/24 0807 01/03/24 0900  01/03/24 1132  BP: (!) 86/51 (!) 100/48 (!) 97/55 (!) 114/54  Pulse: 72 62  71  Resp: 16 17  16   Temp: 98.6 F (37 C) 98.1 F (36.7 C)  98.5 F (36.9 C)  TempSrc: Oral Oral  Oral  SpO2: 92% (!) 89% 98% 94%  Weight:      Height:        Intake/Output Summary (Last 24 hours) at 01/03/2024 1314 Last data filed at 01/03/2024 0900 Gross per 24 hour  Intake 100.1 ml  Output 550 ml  Net -449.9 ml     Wt Readings from Last 3 Encounters:  01/01/24 136.1 kg  11/01/23 (!) 146.1 kg  08/30/23 (!) 139 kg    Physical Exam General: Somnolent but arousable, NAD, oriented to self Cardiovascular: S1 S2 clear, RRR.  Respiratory: CTAB, no wheezing Gastrointestinal: Soft, nontender, nondistended, NBS Ext: no pedal edema bilaterally Neuro: no new deficits Psych: confused   Data Reviewed:  I have personally reviewed following labs    CBC Lab Results  Component Value Date   WBC 6.1 01/02/2024   RBC 4.55 01/02/2024   HGB 11.7 (L) 01/02/2024   HCT 38.2 01/02/2024   MCV 84.0 01/02/2024   MCH 25.7 (L) 01/02/2024   PLT 244 01/02/2024   MCHC 30.6 01/02/2024   RDW 15.0 01/02/2024   LYMPHSABS 1.3 11/01/2023   MONOABS 0.7 11/01/2023   EOSABS 0.4 11/01/2023   BASOSABS 0.1 11/01/2023     Last metabolic panel Lab Results  Component Value Date   NA 139 01/03/2024   K 3.4 (L) 01/03/2024   CL 97 (L) 01/03/2024   CO2 28 01/03/2024   BUN 22 01/03/2024   CREATININE 1.99 (H) 01/03/2024   GLUCOSE 111 (H) 01/03/2024   GFRNONAA 24 (L) 01/03/2024   GFRAA 34 (L) 01/27/2020   CALCIUM  9.0 01/03/2024   PHOS 3.3 01/02/2024   PROT 7.2 01/02/2024   ALBUMIN 2.8 (L) 01/02/2024   LABGLOB 3.1 01/27/2020   BILITOT 0.7 01/02/2024   ALKPHOS 71 01/02/2024   AST 19 01/02/2024   ALT 8 01/02/2024   ANIONGAP 14 01/03/2024    CBG (last 3)  Recent Labs    01/03/24 0605 01/03/24 0809 01/03/24 1131  GLUCAP 105* 126* 133*      Coagulation Profile: No results for input(s): "INR", "PROTIME" in the  last  168 hours.   Radiology Studies: I have personally reviewed the imaging studies  DG Abd 1 View Result Date: 01/01/2024 CLINICAL DATA:  Constipation EXAM: ABDOMEN - 1 VIEW COMPARISON:  None Available. FINDINGS: The bowel gas pattern is normal. Small to moderate stool burden the ascending colon. Relatively decompressed distal colon and rectum. No radio-opaque calculi or other significant radiographic abnormality are seen. IMPRESSION: 1. Nonobstructive bowel gas pattern. Electronically Signed   By: Worthy Heads M.D.   On: 01/01/2024 21:35   DG Chest Portable 1 View Result Date: 01/01/2024 EXAM: 1 VIEW XRAY OF THE CHEST 01/01/2024 07:28:38 PM COMPARISON: 06/09/2020 CLINICAL HISTORY: AMS. Patient BIB [facility] from home for malodorous urine and concerns for UTI. Patient lives with family, family called other family who called PCP, PCP recommended send out. Patient has hx of dementia with agitation and hallucinations, A\T\Ox2 at baseline. FINDINGS: LUNGS AND PLEURA: No consolidation. No pulmonary edema. No pleural effusion. No pneumothorax. HEART AND MEDIASTINUM: No acute abnormality of the cardiac and mediastinal silhouettes. Thoracic aortic atherosclerosis. BONES AND SOFT TISSUES: No acute osseous abnormality. IMPRESSION: 1. No acute findings. Electronically signed by: Zadie Herter MD 01/01/2024 07:32 PM EDT RP Workstation: [id number]   CT Head Wo Contrast Result Date: 01/01/2024 CLINICAL DATA:  Mental status change, unknown cause malodorous urine and concerns for UTI. Patient lives with family, family called other family who called PCP, PCP recommended send out. EXAM: CT HEAD WITHOUT CONTRAST TECHNIQUE: Contiguous axial images were obtained from the base of the skull through the vertex without intravenous contrast. RADIATION DOSE REDUCTION: This exam was performed according to the departmental dose-optimization program which includes automated exposure control, adjustment of the mA and/or kV according  to patient size and/or use of iterative reconstruction technique. COMPARISON:  CT head 09/25/2023. FINDINGS: Brain: Patchy and confluent areas of decreased attenuation are noted throughout the deep and periventricular white matter of the cerebral hemispheres bilaterally, compatible with chronic microvascular ischemic disease. No evidence of large-territorial acute infarction. No parenchymal hemorrhage. No mass lesion. No extra-axial collection. No mass effect or midline shift. No hydrocephalus. Basilar cisterns are patent. Partial empty sella. Vascular: No hyperdense vessel. Atherosclerotic calcifications are present within the cavernous internal carotid arteries. Skull: No acute fracture or focal lesion. Sinuses/Orbits: Paranasal sinuses and mastoid air cells are clear. Bilateral lens replacement. Otherwise the orbits are unremarkable. Other: None. IMPRESSION: 1. No acute intracranial abnormality. 2. Partial empty sella. Findings is often a normal anatomic variant but can be associated with idiopathic intracranial hypertension (pseudotumor cerebri). Electronically Signed   By: Morgane  Naveau M.D.   On: 01/01/2024 18:22       Jeanne [name] M.D. [facility] 01/03/2024, 1:14 PM  Available via Epic secure chat 7am-7pm After 7 pm, please refer to night coverage provider listed on amion.

## 2024-01-03 NOTE — Progress Notes (Signed)
   01/03/24 2331  BiPAP/CPAP/SIPAP  BiPAP/CPAP/SIPAP Pt Type Adult  Reason BIPAP/CPAP not in use Non-compliant (PT states she doesnt use at homr)

## 2024-01-03 NOTE — Progress Notes (Signed)
 Daughter at bedside. Wanting staff to know that patient hallucinates with oxycodone . Noted.

## 2024-01-03 NOTE — Consult Note (Signed)
 Consultation Note Date: 01/03/2024   Patient Name: Jeanne [name]  DOB: November 03, 1935  MRN: [id number]  Age / Sex: 88 y.o., female  PCP: Merl Star, MD Referring Physician: Loma Rising, MD  Reason for Consultation: Establishing goals of care  HPI/Patient Profile: 88 y.o. female  with past medical history of dementia, CAD, DM type II, CKD stage IV, HLD, MGUS, chronic diastolic CHF admitted on 01/01/2024 with worsening confusion and decreased p.o. intake. AMS attributed to dehydration, AKI, and possible UTI. Creatinine improving with IV fluids. PMT consulted to discuss GOC.    Clinical Assessment and Goals of Care: I have reviewed medical records including EPIC notes, labs and imaging,  assessed the patient and then met with patient's daughter Adell Hones [facility])  to discuss diagnosis prognosis, GOC, EOL wishes, disposition and options.  I introduced Palliative Medicine as specialized medical care for people living with serious illness. It focuses on providing relief from the symptoms and stress of a serious illness. The goal is to improve quality of life for both the patient and the family.  We discussed a brief life review of the patient.  Patient is a retired Chartered loss adjuster.  She lives with her 2 daughters Soyla Duverney and Adell Hones.  Her spouse is deceased and she also has a deceased son.  As far as functional and nutritional status Adell Hones shares that patient spends most of her time in bed however she has the ability to walk.  She uses a walker to transfer to the recliner on some days.  She usually sits up on the side of her bed and holds herself up for her meals. Adell Hones does share of a decreased appetite.   We discussed patient's current illness and what it means in the larger context of patient's on-going co-morbidities.  We discussed her presentation and potential causes: UTI, dehydration.   I attempted to elicit values and goals of care important to  the patient. We review patient has a MOST form on file reflecting comfort focused care, DNR, avoiding medical interventions.    We review that patient was recently referred to outpatient palliative care. There were some misconceptions on what kind of care would be provided through palliative care in the outpatient setting - these were clarified. Discussed difference between hospice and palliative. Discussed when patient may be eligible for hospice support and philosophy of hospice care. Adell Hones shares that her sister has a meeting scheduled with [facility] liaison tomorrow to hear more about palliative support outpatient.   Most important goal is for patient to receive care in the home - they are hopeful they will not need facility placement.   Discussed with daughter the importance of continued conversation with family and the medical providers regarding overall plan of care and treatment options, ensuring decisions are within the context of the patient's values and GOCs.    Questions and concerns were addressed. The family was encouraged to call with questions or concerns.    Primary Decision Maker NEXT OF KIN - 2 daughters    SUMMARY OF RECOMMENDATIONS   - patient is DNR/DNI - has MOST form on file reflecting comfort measures only however family is clear they are open to limited intervention such as IV fluids and treating UTI - patient had previously been referred to outpatient palliative and have meeting scheduled with them tomorrow - discussed differences in hospice and palliative - daughter expresses they are not yet ready for hospice - we discussed when this would be appropriate  Code Status/Advance Care Planning: DNR  Primary Diagnoses: Present on Admission:  Acute encephalopathy  AKI (acute kidney injury) (HCC)  Anemia of chronic disease  Chronic diastolic CHF (congestive heart failure) (HCC)  CKD (chronic kidney disease), stage III (HCC)  Coronary artery disease involving  native coronary artery of native heart without angina pectoris   I have reviewed the medical record, interviewed the patient and family, and examined the patient. The following aspects are pertinent.  Past Medical History:  Diagnosis Date   Anemia    Arthritis    Carotid arterial disease (HCC)    COVID-19    Diabetes mellitus    Hyperlipidemia    Hypertension    Migraine    Obesity    Reflux    Sleep apnea    Social History   Socioeconomic History   Marital status: Widowed    Spouse name: Not on file   Number of children: 2   Years of education: Not on file   Highest education level: Not on file  Occupational History   Not on file  Tobacco Use   Smoking status: Never   Smokeless tobacco: Never  Vaping Use   Vaping status: Never Used  Substance and Sexual Activity   Alcohol use: Never   Drug use: No   Sexual activity: Not on file  Other Topics Concern   Not on file  Social History Narrative   Not on file   Social Drivers of Health   Financial Resource Strain: Not on file  Food Insecurity: No Food Insecurity (01/01/2024)   Hunger Vital Sign    Worried About Running Out of Food in the Last Year: Never true    Ran Out of Food in the Last Year: Never true  Transportation Needs: No Transportation Needs (01/01/2024)   PRAPARE - Administrator, Civil Service (Medical): No    Lack of Transportation (Non-Medical): No  Physical Activity: Not on file  Stress: Not on file  Social Connections: Moderately Isolated (01/01/2024)   Social Connection and Isolation Panel [NHANES]    Frequency of Communication with Friends and Family: Never    Frequency of Social Gatherings with Friends and Family: More than three times a week    Attends Religious Services: Never    Database administrator or Organizations: Yes    Attends Banker Meetings: Never    Marital Status: Widowed   Family History  Problem Relation Age of Onset   Heart attack Father    Heart  attack Mother    Scheduled Meds:  aspirin  EC  81 mg Oral Daily   docusate sodium   100 mg Oral BID   ezetimibe   10 mg Oral Daily   ferrous sulfate   325 mg Oral QHS   [START ON 01/04/2024] metoprolol  succinate  75 mg Oral Daily   pantoprazole   40 mg Oral Daily   pravastatin   40 mg Oral Daily   senna  1 tablet Oral BID   tamsulosin   0.4 mg Oral Daily   Continuous Infusions:  cefTRIAXone  (ROCEPHIN )  IV 1 g (01/03/24 1114)   lactated ringers  75 mL/hr at 01/03/24 1345   PRN Meds:.acetaminophen  **OR** acetaminophen , bisacodyl , HYDROcodone -acetaminophen , LORazepam , ondansetron  **OR** ondansetron  (ZOFRAN ) IV Allergies  Allergen Reactions   Amoxicillin Nausea Only    Has patient had a PCN reaction causing immediate rash, facial/tongue/throat swelling, SOB or lightheadedness with hypotension: "yes, i was dizzy" Has patient had a PCN reaction causing severe rash involving mucus membranes or skin necrosis: no Did a PCN  reaction that required hospitalization : no, called the DR. Did PCN reaction occurring within the last 10 years: unk if all of the above answers are "NO", then may proceed with Cephalosporin use.    Canagliflozin Nausea Only and Other (See Comments)    Dizziness  Dizziness  Dizziness   Enalapril  Maleate     Other reaction(s): angioedema  Other Reaction(s): Other (See Comments)   Liraglutide     Other reaction(s): Nausea and dizziness   Pioglitazone     Other reaction(s): macular edema   Tramadol Hcl     Other reaction(s): nausea, headache and dizziness   Review of Systems  Unable to perform ROS: Dementia    Physical Exam Constitutional:      General: She is not in acute distress.    Appearance: She is ill-appearing.     Comments: Drowsy - wakes to voice  Pulmonary:     Effort: Pulmonary effort is normal.  Skin:    General: Skin is warm and dry.  Neurological:     Mental Status: She is disoriented.     Vital Signs: BP 134/72 (BP Location: Left Arm)   Pulse  69   Temp (!) 97.4 F (36.3 C) (Oral)   Resp 17   Ht 5\' 4"  (1.626 m)   Wt 136.1 kg   SpO2 99%   BMI 51.49 kg/m  Pain Scale: PAINAD       SpO2: SpO2: 99 % O2 Device:SpO2: 99 % O2 Flow Rate: .   IO: Intake/output summary:  Intake/Output Summary (Last 24 hours) at 01/03/2024 1803 Last data filed at 01/03/2024 0900 Gross per 24 hour  Intake --  Output 550 ml  Net -550 ml    LBM: Last BM Date : 12/29/23 Baseline Weight: Weight: 136.1 kg Most recent weight: Weight: 136.1 kg     Palliative Assessment/Data: PPS 40%     *Please note that this is a verbal dictation therefore any spelling or grammatical errors are due to the "[employer]agon [employer] One" system interpretation.   Time Total: 70 minutes Time spent includes: Detailed review of medical records (labs, imaging, vital signs), medically appropriate exam, discussion with treatment team, counseling and educating patient, family and/or staff, documenting clinical information, medication management and coordination of care.    Alvino Aye, DNP, AGNP-C Palliative Medicine Team [phone] Pager: [phone]

## 2024-01-03 NOTE — Plan of Care (Signed)

## 2024-01-04 DIAGNOSIS — G9340 Encephalopathy, unspecified: Secondary | ICD-10-CM | POA: Diagnosis not present

## 2024-01-04 DIAGNOSIS — R338 Other retention of urine: Secondary | ICD-10-CM | POA: Diagnosis not present

## 2024-01-04 LAB — BASIC METABOLIC PANEL WITH GFR
Anion gap: 11 (ref 5–15)
BUN: 18 mg/dL (ref 8–23)
CO2: 30 mmol/L (ref 22–32)
Calcium: 9 mg/dL (ref 8.9–10.3)
Chloride: 98 mmol/L (ref 98–111)
Creatinine, Ser: 1.86 mg/dL — ABNORMAL HIGH (ref 0.44–1.00)
GFR, Estimated: 26 mL/min — ABNORMAL LOW (ref >60)
Glucose, Bld: 108 mg/dL — ABNORMAL HIGH (ref 70–99)
Potassium: 3.5 mmol/L (ref 3.5–5.1)
Sodium: 139 mmol/L (ref 135–145)

## 2024-01-04 LAB — GLUCOSE, CAPILLARY
Glucose-Capillary: 107 mg/dL — ABNORMAL HIGH (ref 70–99)
Glucose-Capillary: 120 mg/dL — ABNORMAL HIGH (ref 70–99)
Glucose-Capillary: 123 mg/dL — ABNORMAL HIGH (ref 70–99)
Glucose-Capillary: 132 mg/dL — ABNORMAL HIGH (ref 70–99)
Glucose-Capillary: 134 mg/dL — ABNORMAL HIGH (ref 70–99)
Glucose-Capillary: 145 mg/dL — ABNORMAL HIGH (ref 70–99)

## 2024-01-04 NOTE — Plan of Care (Signed)

## 2024-01-04 NOTE — Progress Notes (Signed)
 Physical Therapy Treatment Patient Details Name: Jeanne [name] MRN: [id number] DOB: [birth year] Today's Date: 01/04/2024   History of Present Illness Pt is 88 year old presented to [facility] on  01/01/24 for worsening confusion. Acute metabolic encephalopathy likely due to dehydration, AKI, and possible UTI. PMH - R femur ORIF, dementia, HTN, CAD, hyperlipidemia, DMT2, GERD, HF, CKD 3B.    PT Comments  Pt received in supine, agreeable to therapy session with encouragement. Pt pleasantly confused and needing frequent cues for attention to task. Pt with good effort to transfer to sitting EOB, needing only minA this date, however despite encouragement and education, pt not agreeable to attempt standing or transfer OOB to recliner. Attempted to call pt's daughter however call went to voicemail. Pt agreeable only to return to supine and requesting assist to go home, despite reorientation to situation; RN notified of pt increased confusion in PM compared with AM. Pt continues to benefit from PT services to progress toward functional mobility goals.     If plan is discharge home, recommend the following: Assistance with cooking/housework;Assist for transportation;A lot of help with bathing/dressing/bathroom;A lot of help with walking and/or transfers   Can travel by private vehicle        Equipment Recommendations  None recommended by PT    Recommendations for Other Services       Precautions / Restrictions Precautions Precautions: Fall Recall of Precautions/Restrictions: Impaired Precaution/Restrictions Comments: foley catheter; possible sundowning Restrictions Weight Bearing Restrictions Per Provider Order: No     Mobility  Bed Mobility Overal bed mobility: Needs Assistance Bed Mobility: Supine to Sit, Sit to Supine     Supine to sit: Min assist, HOB elevated, Used rails Sit to supine: Min assist   General bed mobility comments: Increased time, encouragement to initiate     Transfers Overall transfer level: Needs assistance                 General transfer comment: pt refusing despite max encouragement; more confused today, RN notified    Ambulation/Gait                   Stairs             Wheelchair Mobility     Tilt Bed    Modified Rankin (Stroke Patients Only)       Balance Overall balance assessment: Needs assistance Sitting-balance support: Feet supported, No upper extremity supported Sitting balance-Leahy Scale: Fair Sitting balance - Comments: pt sitting ~10 mins EOB with single UE support and unsupported       Standing balance comment: pt refusing                            Communication Communication Communication: No apparent difficulties  Cognition Arousal: Alert Behavior During Therapy: Restless, Anxious   PT - Cognitive impairments: History of cognitive impairments, No family/caregiver present to determine baseline, Orientation, Awareness, Memory, Attention, Sequencing, Safety/Judgement   Orientation impairments: Place, Time, Situation                   PT - Cognition Comments: Pt perseverating on wanting to go home; attempted to call daughter in effort to calm and reorient her for pt emotional support, but call went to voicemail, PTA left a HIPAA complaint VM per pt request for call back when she has time. Following commands: Impaired Following commands impaired: Only follows one step commands consistently, Follows one step commands with increased  time, Follows multi-step commands inconsistently    Cueing Cueing Techniques: Verbal cues, Tactile cues, Gestural cues  Exercises      General Comments General comments (skin integrity, edema, etc.): Pt perseverating on needing to urinate, despite max education on foley catheter; pt also c/o wanting to go home      Pertinent Vitals/Pain Pain Assessment Pain Assessment: PAINAD Breathing: normal Negative Vocalization:  none Facial Expression: smiling or inexpressive Body Language: tense, distressed pacing, fidgeting Consolability: distracted or reassured by voice/touch PAINAD Score: 2 Pain Intervention(s): Limited activity within patient's tolerance, Monitored during session, Repositioned (pt c/o urinary urgency but already has a foley in)    Home Living                          Prior Function            PT Goals (current goals can now be found in the care plan section) Acute Rehab PT Goals Patient Stated Goal: daughter would like pt to return home with palliative care at dc PT Goal Formulation: With family Time For Goal Achievement: 01/16/24 Progress towards PT goals: Progressing toward goals    Frequency    Min 2X/week      PT Plan      Co-evaluation              AM-PAC PT "6 Clicks" Mobility   Outcome Measure  Help needed turning from your back to your side while in a flat bed without using bedrails?: A Little Help needed moving from lying on your back to sitting on the side of a flat bed without using bedrails?: A Little Help needed moving to and from a bed to a chair (including a wheelchair)?: Total Help needed standing up from a chair using your arms (e.g., wheelchair or bedside chair)?: Total Help needed to walk in hospital room?: Total Help needed climbing 3-5 steps with a railing? : Total 6 Click Score: 10    End of Session Equipment Utilized During Treatment:  (refusing standing) Activity Tolerance: Other (comment);Treatment limited secondary to medical complications (Comment) (cog deficit, possible sundowning behaviors limiting her participation; RN notified) Patient left: in bed;with call bell/phone within reach;with bed alarm set (pt defers SCD or transfer OOB to chair) Nurse Communication: Mobility status;Other (comment) (behaviors; refusing OOB to chair trial) PT Visit Diagnosis: Other abnormalities of gait and mobility (R26.89);Muscle weakness  (generalized) (M62.81)     Time: 1610-9604 PT Time Calculation (min) (ACUTE ONLY): 28 min  Charges:    $Therapeutic Activity: 23-37 mins PT General Charges $$ ACUTE PT VISIT: 1 Visit                     Cherly Erno P., PTA Acute Rehabilitation Services Secure Chat Preferred 9a-5:30pm Office: [phone]    Mariel Shope [facility] 01/04/2024, 6:31 PM

## 2024-01-04 NOTE — Progress Notes (Signed)
 Pt refusing vital signs and blood sugar checks.

## 2024-01-04 NOTE — Plan of Care (Signed)
 progressing

## 2024-01-04 NOTE — Progress Notes (Signed)
 [facility]                                                                           Jeanne [name], is a 88 y.o. female, DOB - Dec 28, 1935, KGM:[id number] Admit date - 01/01/2024    Outpatient Primary MD for the patient is Merl Star, MD  LOS - 0  days  Chief Complaint  Patient presents with   Malodorous Urine      Brief summary   Jeanne [name] is a 88 year old female with dementia, CAD, DM type II, CKD stage IV, HLD, MGUS, chronic diastolic CHF presented with worsening confusion and decreased p.o. intake.  Patient has been diagnosed with dementia a year ago and has been getting worse. Diagnosed with UTI and acute urinary retention.  PT/OT evaluated and recommended HH. Likely dc 5/2 and will follow with palliative outpatient.   Assessment & Plan     Acute metabolic encephalopathy superimposed on dementia- appears back to baseline today.  -Likely due to dehydration, AKI, may have underlying UTI - KUB with no ileus or obstruction, chest x-ray negative for any infiltrates, no leukocytosis  AKI superimposed on CKD stage IIIb- resolved back to baseline  [place]  Urine culture pending. Currently on ceftriaxone   Follow cultures for antibiotic guidance  Acute urinary retention - Noted to have acute urinary retention on 4/30, Foley catheter placed.   - Continue Flomax  X 7 days - continue foley at discharge vs void trial.     Anemia of chronic disease, CKD, normocytic -H&H currently stable    DM2 (diabetes mellitus, type 2) (HCC), IDDM -Continue sliding scale insulin  while inpatient Hemoglobin A1c 6.9    Coronary artery disease involving native coronary artery of native heart without angina pectoris -Currently no acute chest pain or shortness of breath, continue aspirin , Zetia , Toprol , Pravachol   Hypertension - BP currently soft, discontinue amlodipine  - continue Toprol -XL for now, decreased to 75 mg daily  Obesity class III, morbid obesity Estimated body  mass index is 51.49 kg/m as calculated from the following:   Height as of this encounter: 5\' 4"  (1.626 m).   Weight as of this encounter: 136.1 kg.  Code Status: DNR DVT Prophylaxis:  SCDs Start: 01/01/24 2111   Level of Care: Level of care: Telemetry Medical Family Communication: daughter at bedside Disposition Plan:      Remains inpatient appropriate  Procedures:  none  Consultants:   Palliative medicine  Antimicrobials:   Anti-infectives (From admission, onward)    Start     Dose/Rate Route Frequency Ordered Stop   01/02/24 1100  cefTRIAXone  (ROCEPHIN ) 1 g in sodium chloride  0.9 % 100 mL IVPB        1 g 200 mL/hr over 30 Minutes Intravenous Every 24 hours 01/02/24 1009     01/01/24 1730  cefTRIAXone  (ROCEPHIN ) 1 g in sodium chloride  0.9 % 100 mL IVPB  Status:  Discontinued        1 g 200 mL/hr over 30 Minutes Intravenous  Once 01/01/24 1716 01/01/24 1731       Medications  aspirin  EC  81 mg Oral Daily   docusate sodium   100 mg Oral BID  ezetimibe   10 mg Oral Daily   ferrous sulfate   325 mg Oral QHS   metoprolol  succinate  75 mg Oral Daily   pantoprazole   40 mg Oral Daily   pravastatin   40 mg Oral Daily   senna  1 tablet Oral BID   tamsulosin   0.4 mg Oral Daily    Subjective:   Jeanne [name] describes feeling better today and says she remembers coming to the hospital. Denies pelvic discomfort with foley. She is oriented x3 with daughter at bedside.   Objective:   Vitals:   01/03/24 1132 01/03/24 1557 01/04/24 0041 01/04/24 0419  BP: (!) 114/54 134/72 120/68 (!) 124/46  Pulse: 71 69 70 70  Resp: 16 17 18 18   Temp: 98.5 F (36.9 C) (!) 97.4 F (36.3 C) 98.5 F (36.9 C) 98 F (36.7 C)  TempSrc: Oral Oral Oral Oral  SpO2: 94% 99% 100% 97%  Weight:      Height:        Intake/Output Summary (Last 24 hours) at 01/04/2024 0808 Last data filed at 01/04/2024 0530 Gross per 24 hour  Intake 1385.33 ml  Output 650 ml  Net 735.33 ml    Wt Readings from  Last 3 Encounters:  01/01/24 136.1 kg  11/01/23 (!) 146.1 kg  08/30/23 (!) 139 kg    Physical Exam General: NAD, awake and alert Cardiovascular: S1 S2 clear, RRR.  Respiratory: CTAB, no wheezing Gastrointestinal: Soft, nontender, nondistended, NBS Ext: no pedal edema bilaterally Neuro: no new deficits Psych: normal mood. Oriented x3  Data Reviewed:  I have personally reviewed following labs    CBC Lab Results  Component Value Date   WBC 6.1 01/02/2024   RBC 4.55 01/02/2024   HGB 11.7 (L) 01/02/2024   HCT 38.2 01/02/2024   MCV 84.0 01/02/2024   MCH 25.7 (L) 01/02/2024   PLT 244 01/02/2024   MCHC 30.6 01/02/2024   RDW 15.0 01/02/2024   LYMPHSABS 1.3 11/01/2023   MONOABS 0.7 11/01/2023   EOSABS 0.4 11/01/2023   BASOSABS 0.1 11/01/2023     Last metabolic panel Lab Results  Component Value Date   NA 139 01/03/2024   K 3.4 (L) 01/03/2024   CL 97 (L) 01/03/2024   CO2 28 01/03/2024   BUN 22 01/03/2024   CREATININE 1.99 (H) 01/03/2024   GLUCOSE 111 (H) 01/03/2024   GFRNONAA 24 (L) 01/03/2024   GFRAA 34 (L) 01/27/2020   CALCIUM  9.0 01/03/2024   PHOS 3.3 01/02/2024   PROT 7.2 01/02/2024   ALBUMIN 2.8 (L) 01/02/2024   LABGLOB 3.1 01/27/2020   BILITOT 0.7 01/02/2024   ALKPHOS 71 01/02/2024   AST 19 01/02/2024   ALT 8 01/02/2024   ANIONGAP 14 01/03/2024    CBG (last 3)  Recent Labs    01/03/24 2007 01/04/24 0043 01/04/24 0420  GLUCAP 124* 123* 120*     Ree Candy M.D. [facility] 01/04/2024, 8:08 AM  Available via Epic secure chat 7am-7pm After 7 pm, please refer to night coverage provider listed on amion.

## 2024-01-05 DIAGNOSIS — G9340 Encephalopathy, unspecified: Secondary | ICD-10-CM | POA: Diagnosis not present

## 2024-01-05 LAB — BASIC METABOLIC PANEL WITH GFR
Anion gap: 10 (ref 5–15)
BUN: 17 mg/dL (ref 8–23)
CO2: 29 mmol/L (ref 22–32)
Calcium: 9.3 mg/dL (ref 8.9–10.3)
Chloride: 97 mmol/L — ABNORMAL LOW (ref 98–111)
Creatinine, Ser: 1.67 mg/dL — ABNORMAL HIGH (ref 0.44–1.00)
GFR, Estimated: 30 mL/min — ABNORMAL LOW
Glucose, Bld: 118 mg/dL — ABNORMAL HIGH (ref 70–99)
Potassium: 3.4 mmol/L — ABNORMAL LOW (ref 3.5–5.1)
Sodium: 136 mmol/L (ref 135–145)

## 2024-01-05 LAB — GLUCOSE, CAPILLARY
Glucose-Capillary: 102 mg/dL — ABNORMAL HIGH (ref 70–99)
Glucose-Capillary: 108 mg/dL — ABNORMAL HIGH (ref 70–99)
Glucose-Capillary: 116 mg/dL — ABNORMAL HIGH (ref 70–99)
Glucose-Capillary: 127 mg/dL — ABNORMAL HIGH (ref 70–99)
Glucose-Capillary: 154 mg/dL — ABNORMAL HIGH (ref 70–99)
Glucose-Capillary: 98 mg/dL (ref 70–99)

## 2024-01-05 MED ORDER — QUETIAPINE FUMARATE 25 MG PO TABS
12.5000 mg | ORAL_TABLET | Freq: Every day | ORAL | Status: DC
  Administered 2024-01-05 – 2024-01-06 (×2): 12.5 mg via ORAL
  Filled 2024-01-05 (×2): qty 1

## 2024-01-05 MED ORDER — QUETIAPINE FUMARATE 25 MG PO TABS
50.0000 mg | ORAL_TABLET | Freq: Every day | ORAL | Status: DC
  Filled 2024-01-05: qty 2

## 2024-01-05 MED ORDER — POTASSIUM CHLORIDE CRYS ER 20 MEQ PO TBCR
20.0000 meq | EXTENDED_RELEASE_TABLET | Freq: Once | ORAL | Status: AC
  Administered 2024-01-05: 20 meq via ORAL
  Filled 2024-01-05: qty 1

## 2024-01-05 MED ORDER — TAMSULOSIN HCL 0.4 MG PO CAPS
0.4000 mg | ORAL_CAPSULE | Freq: Every day | ORAL | Status: DC
  Administered 2024-01-05 – 2024-01-06 (×2): 0.4 mg via ORAL
  Filled 2024-01-05 (×2): qty 1

## 2024-01-05 NOTE — Progress Notes (Signed)
 Mobility Specialist: Progress Note   01/05/24 1301  Mobility  Activity Ambulated with assistance in room  Level of Assistance Minimal assist, patient does 75% or more  Assistive Device Front wheel walker  Distance Ambulated (ft) 15 ft  Activity Response Tolerated well  Mobility Referral Yes  Mobility visit 1 Mobility  Mobility Specialist Start Time (ACUTE ONLY) 1216  Mobility Specialist Stop Time (ACUTE ONLY) 1243  Mobility Specialist Time Calculation (min) (ACUTE ONLY) 27 min    During Mobility (EOB): SpO2 99% RA, HR 96  Pt was pleasantly confused and agreeable to mobility after some convincing and reorientation. MinA+2 for bed mobility d/t confusion, pt needed assist with redirection and trunk elevation. MinA for STS, MinG for ambulation that required minA for physical assistance when L knee buckled 1x. No complaints. Returned to EOB. Pt able to laterally scoot towards EOB. MinA for sit>supine to assist with BLEs. Left in supine with all needs met, call bell in reach. Bed alarm and handmits on.   Deloria Fetch Mobility Specialist Please contact via SecureChat or Rehab office at [phone]

## 2024-01-05 NOTE — Progress Notes (Signed)
 PROGRESS NOTE Jeanne [name]  EAV:[id number] DOB: 02/21/1936 DOA: 01/01/2024 PCP: Merl Star, MD  Brief Narrative/Hospital Course: 88 year old female with dementia, CAD, DM type II, CKD stage IV, HLD, MGUS, chronic diastolic CHF presented with worsening confusion and decreased p.o. intake.  Patient has been diagnosed with dementia a year ago and has been getting worse.Diagnosed with UTI and acute urinary retention and admitted. Palliative care has been consulted, DNR. PT/OT following recommending home health.    Subjective Seen this am Aaox1-2 Overnight patient is afebrile BP stable Labs reviewed this morning mild hypokalemia creatinine 1.6 from 1.8 Daughter at bedside Still foley present since admit, still combative per daughter- plan for seroquel qtc 486 on admit   Assessment and plan  Acute metabolic encephalopathy superimposed on dementia Baseline dementia with confusion: Mentation worsened by her UTI but also having progressive dementia.KUB with no ileus or obstruction, chest x-ray negative for any infiltrates, no leukocytosis Daughter endorses caregiver fatigue, was placed on trazodone by PCP but not started yet Currently eating mittens no sitter, daughter concerned about her combativeness After extensive discussion plan for Seroquel low-dose in the morning and 50 nightly-last EKG showed QTc 486 on admit Patient is alert awake but confused   AKI superimposed on CKD stage IIIb: Creatinine improved 1.6 at baseline.  Encourage oral hydration  Mild hypokalemia: Replaced  E coli UTI: Continue ceftriaxone  and culture pending    Acute urinary retention: Suspect due to UTI, on antibiotics for at least 3 days, will do voiding trial as inpatient given her dementia-not ideal to discharge her on Foley catheter, risk of trauma/recurrent UTI is high. Added back on Flomax    Anemia of chronic kidney disease Stable, monitor Recent Labs  Lab 01/01/24 1434 01/02/24 1117  HGB 12.0  11.7*  HCT 39.4 38.2    .  T2DM- IDDM: Blood sugar is well-controlled A1c 6.9 continue SSI  CAD Hypertension Hyperlipidemia: Stable no chest pain BP is well-controlled.  Continue home aspirin  81, Toprol  decreased to 75, pravastatin , Zetia   Morbid obesity w/ Body mass index is 51.49 kg/m.: Will benefit with PCP follow-up, weight loss,healthy lifestyle and outpatient sleep eval if not done.  Goals of care: Seen by palliative care, continue DNR/DNI, overall prognosis guarded.  Daughter has been aware about hospice and palliative care and they are not ready for hospice yet.  DVT prophylaxis: SCDs Start: 01/01/24 2111 Code Status:   Code Status: Limited: Do not attempt resuscitation (DNR) -DNR-LIMITED -Do Not Intubate/DNI  Family Communication: plan of care discussed with patient and daughter at bedside. Patient status is: Remains hospitalized because of severity of illness Level of care: Telemetry Medical   Dispo: The patient is from: home w/ daughter            Anticipated disposition: Home with daughter next 1 to 2 days Objective: Vitals last 24 hrs: Vitals:   01/04/24 2007 01/04/24 2252 01/05/24 0519 01/05/24 0847  BP: (!) 149/84 (!) 115/92 137/67 (!) 121/102  Pulse: 71 80 70 76  Resp: 16 18 16 18   Temp: 98.3 F (36.8 C)  98.4 F (36.9 C) 98.5 F (36.9 C)  TempSrc:   Oral Oral  SpO2: 100% 98% 100% 100%  Weight:      Height:        Physical Examination: General exam: alert awake, oriented to self, daughter  HEENT:Oral mucosa moist, Ear/Nose WNL grossly Respiratory system: B.L clear BS, no use of accessory muscle Cardiovascular system: S1 & S2 +. Gastrointestinal system: Abdomen soft, obese, NT,ND,BS+  Nervous System: Alert, awake,  following commands. Extremities: LE edema neg, warm extremities Skin: No rashes,warm. MSK: Normal muscle bulk/tone.   Data Reviewed: I have personally reviewed following labs and imaging studies ( see epic result tab) CBC: Recent Labs   Lab 01/01/24 1434 01/02/24 1117  WBC 7.4 6.1  HGB 12.0 11.7*  HCT 39.4 38.2  MCV 84.9 84.0  PLT 247 244   CMP: Recent Labs  Lab 01/01/24 1540 01/02/24 1117 01/03/24 0655 01/04/24 0757 01/05/24 0650  NA 135 138 139 139 136  K 3.7 3.4* 3.4* 3.5 3.4*  CL 91* 95* 97* 98 97*  CO2 29 29 28 30 29   GLUCOSE 88 113* 111* 108* 118*  BUN 28* 22 22 18 17   CREATININE 2.43* 1.91* 1.99* 1.86* 1.67*  CALCIUM  9.6 9.4 9.0 9.0 9.3  MG  --  1.9  --   --   --   PHOS  --  3.3  --   --   --    GFR: Estimated Creatinine Clearance: 33.9 mL/min (A) (by C-G formula based on SCr of 1.67 mg/dL (H)). Recent Labs  Lab 01/01/24 1540 01/02/24 1117  AST 20 19  ALT 6 8  ALKPHOS 76 71  BILITOT 0.8 0.7  PROT 7.5 7.2  ALBUMIN 3.0* 2.8*   No results for input(s): "LIPASE", "AMYLASE" in the last 168 hours. No results for input(s): "AMMONIA" in the last 168 hours. Coagulation Profile: No results for input(s): "INR", "PROTIME" in the last 168 hours. Unresulted Labs (From admission, onward)    None      Antimicrobials/Microbiology: Anti-infectives (From admission, onward)    Start     Dose/Rate Route Frequency Ordered Stop   01/02/24 1100  cefTRIAXone  (ROCEPHIN ) 1 g in sodium chloride  0.9 % 100 mL IVPB        1 g 200 mL/hr over 30 Minutes Intravenous Every 24 hours 01/02/24 1009     01/01/24 1730  cefTRIAXone  (ROCEPHIN ) 1 g in sodium chloride  0.9 % 100 mL IVPB  Status:  Discontinued        1 g 200 mL/hr over 30 Minutes Intravenous  Once 01/01/24 1716 01/01/24 1731         Component Value Date/Time   SDES URINE, CLEAN CATCH 01/02/2024 1209   SPECREQUEST Normal 01/02/2024 1209   CULT (A) 01/02/2024 1209    >=100,000 COLONIES/mL ESCHERICHIA COLI SUSCEPTIBILITIES TO FOLLOW Performed at [facility], [address], [place], [place] [address]    REPTSTATUS PENDING 01/02/2024 1209     Medications reviewed:  Scheduled Meds:  aspirin  EC  81 mg Oral Daily   docusate sodium   100 mg Oral  BID   ezetimibe   10 mg Oral Daily   ferrous sulfate   325 mg Oral QHS   metoprolol  succinate  75 mg Oral Daily   pantoprazole   40 mg Oral Daily   potassium chloride   20 mEq Oral Once   pravastatin   40 mg Oral Daily   QUEtiapine  12.5 mg Oral Daily   QUEtiapine  50 mg Oral QHS   senna  1 tablet Oral BID   tamsulosin   0.4 mg Oral Daily   Continuous Infusions:  cefTRIAXone  (ROCEPHIN )  IV 1 g (01/04/24 1030)    Lesa Rape, MD [facility] 01/05/2024, 1:57 PM

## 2024-01-05 NOTE — Hospital Course (Addendum)
 88 year old female with dementia, CAD, DM type II, CKD stage IV, HLD, MGUS, chronic diastolic CHF presented with worsening confusion and decreased p.o. intake.  Patient has been diagnosed with dementia a year ago and has been getting worse.Diagnosed with UTI and acute urinary retention and admitted. Palliative care has been consulted, DNR. PT/OT following recommending home health. Patient grew E. coli UTI sensitive to Keflex  ceftriaxone  Macrobid.  Foley catheter was removed 5/3 and voiding well.  She was placed on Seroquel-and slept well. At this time patient and daughter agreeable for taking her home with home health Advised to follow-up with outpatient neurology/PCP for her dementia management to monitor the Seroquel use  Subjective She examined this morning more alert awake Daughter at the bedside Overnight patient is afebrile BP stable on room air  Foley has been discontinued 5/3 Blood sugar remains stable  Voiding well   Discharge diagnosis: Acute metabolic encephalopathy superimposed on dementia Baseline dementia with confusion: Mentation worsened by her UTI but also having progressive dementia.KUB with no ileus or obstruction, chest x-ray negative for any infiltrates, no leukocytosis Daughter endorsed caregiver fatigue, was placed on trazodone by PCP but not started yet Currently having mittens no sitter, daughter concerned about her combativeness-after extensive discussion  started Seroquel 12.5mg  am and 50 nightly on 01/05/24 -last EKG showed QTc 486 on admit Repeat EKG obtained 5/4 QTc further down 444 Continue current medication follow-up with PCP/neurology for dementia management titrate/monitor Seroquel.   AKI superimposed on CKD stage IIIb: Creatinine improved 1.6 at baseline.  Encourage oral hydration  Mild hypokalemia: Replaced  E coli UTI: 5 days ceftriaxone  received will discharge on 3 more days of duricef   Acute urinary retention: Suspect due to UTI given her  dementia-not ideal to discharge her on Foley catheter, risk of trauma/recurrent UTI is high. Continue Flomax , Foley discontinued 5/3, voiding well   Anemia of chronic kidney disease Stable, monitor Recent Labs  Lab 01/01/24 1434 01/02/24 1117  HGB 12.0 11.7*  HCT 39.4 38.2    .  T2DM- IDDM: Blood sugar is well-controlled A1c 6.9.  Has not needed insulin  here, will continue to hold off on insulin  seems onlong-acting at home, advised to check blood sugar if she starts to go up 180-200 may need to resume lower dose insulin , daughter is aware and she will follow-up with PCP Recent Labs  Lab 01/01/24 2154 01/01/24 2245 01/05/24 1726 01/05/24 2027 01/05/24 2356 01/06/24 0415 01/06/24 0804  GLUCAP  --    < > 116* 102* 98 100* 107*  HGBA1C 6.9*  --   --   --   --   --   --    < > = values in this interval not displayed.    CAD Hypertension Hyperlipidemia: Stable no chest pain BP is well-controlled.  Continue home aspirin  81, Toprol  decreased to 75, pravastatin , Zetia   Morbid obesity w/ Body mass index is 51.49 kg/m.: Will benefit with PCP follow-up, weight loss,healthy lifestyle and outpatient sleep eval if not done.  Goals of care: Seen by palliative care, continue DNR/DNI, overall prognosis guarded.  Daughter has been aware about hospice and palliative care and they are not ready for hospice yet.  Plan is for discharge to home with home health.  Advised to follow-up with outpatient palliative care

## 2024-01-06 DIAGNOSIS — G9340 Encephalopathy, unspecified: Secondary | ICD-10-CM | POA: Diagnosis not present

## 2024-01-06 DIAGNOSIS — F0390 Unspecified dementia without behavioral disturbance: Secondary | ICD-10-CM | POA: Diagnosis not present

## 2024-01-06 DIAGNOSIS — N179 Acute kidney failure, unspecified: Secondary | ICD-10-CM | POA: Diagnosis not present

## 2024-01-06 DIAGNOSIS — I2510 Atherosclerotic heart disease of native coronary artery without angina pectoris: Secondary | ICD-10-CM | POA: Diagnosis not present

## 2024-01-06 DIAGNOSIS — R82998 Other abnormal findings in urine: Secondary | ICD-10-CM | POA: Diagnosis not present

## 2024-01-06 LAB — URINE CULTURE
Culture: >=100000 — AB
Special Requests: NORMAL

## 2024-01-06 LAB — GLUCOSE, CAPILLARY
Glucose-Capillary: 100 mg/dL — ABNORMAL HIGH (ref 70–99)
Glucose-Capillary: 107 mg/dL — ABNORMAL HIGH (ref 70–99)
Glucose-Capillary: 102 mg/dL — ABNORMAL HIGH (ref 70–99)

## 2024-01-06 MED ORDER — QUETIAPINE FUMARATE 25 MG PO TABS: 12.5000 mg | ORAL_TABLET | Freq: Every day | ORAL | 0 refills | Status: DC

## 2024-01-06 MED ORDER — METOPROLOL SUCCINATE ER 25 MG PO TB24: 75.0000 mg | ORAL_TABLET | Freq: Every day | ORAL | 0 refills | Status: DC

## 2024-01-06 MED ORDER — QUETIAPINE FUMARATE 50 MG PO TABS: 50.0000 mg | ORAL_TABLET | Freq: Every day | ORAL | 0 refills | Status: DC

## 2024-01-06 MED ORDER — HYDRALAZINE HCL 25 MG PO TABS: 25.0000 mg | ORAL_TABLET | Freq: Three times a day (TID) | ORAL | Status: DC | PRN

## 2024-01-06 MED ORDER — CEFADROXIL 500 MG PO CAPS: 500.0000 mg | ORAL_CAPSULE | Freq: Two times a day (BID) | ORAL | 0 refills | Status: AC

## 2024-01-06 NOTE — Discharge Summary (Signed)
 Physician Discharge Summary  ANDREA DUCK WUJ:[id number] DOB: November 11, 1935 DOA: 01/01/2024  PCP: Merl Star, MD  Admit date: 01/01/2024 Discharge date: 01/06/2024 Recommendations for Outpatient Follow-up:  Follow up with PCP in 1 weeks-call for appointment Please obtain BMP/CBC in one week  Discharge Dispo: Home w/ [facility] Discharge Condition: Stable Code Status:   Code Status: Limited: Do not attempt resuscitation (DNR) -DNR-LIMITED -Do Not Intubate/DNI  Diet recommendation:  Diet Order             Diet heart healthy/carb modified Room service appropriate? Yes; Fluid consistency: Thin  Diet effective now                    Brief/Interim Summary: 88 year old female with dementia, CAD, DM type II, CKD stage IV, HLD, MGUS, chronic diastolic CHF presented with worsening confusion and decreased p.o. intake.  Patient has been diagnosed with dementia a year ago and has been getting worse.Diagnosed with UTI and acute urinary retention and admitted. Palliative care has been consulted, DNR. PT/OT following recommending home health. Patient grew E. coli UTI sensitive to Keflex  ceftriaxone  Macrobid.  Foley catheter was removed 5/3 and voiding well.  She was placed on Seroquel-and slept well. At this time patient and daughter agreeable for taking her home with home health Advised to follow-up with outpatient neurology/PCP for her dementia management to monitor the Seroquel use  Subjective She examined this morning more alert awake Daughter at the bedside Overnight patient is afebrile BP stable on room air  Foley has been discontinued 5/3 Blood sugar remains stable  Voiding well   Discharge diagnosis: Acute metabolic encephalopathy superimposed on dementia Baseline dementia with confusion: Mentation worsened by her UTI but also having progressive dementia.KUB with no ileus or obstruction, chest x-ray negative for any infiltrates, no leukocytosis Daughter endorsed caregiver fatigue, was  placed on trazodone by PCP but not started yet Currently having mittens no sitter, daughter concerned about her combativeness-after extensive discussion  started Seroquel 12.5mg  am and 50 nightly on 01/05/24 -last EKG showed QTc 486 on admit Repeat EKG obtained 5/4 QTc further down 444 Continue current medication follow-up with PCP/neurology for dementia management titrate/monitor Seroquel.   AKI superimposed on CKD stage IIIb: Creatinine improved 1.6 at baseline.  Encourage oral hydration  Mild hypokalemia: Replaced  E coli UTI: 5 days ceftriaxone  received will discharge on 3 more days of duricef   Acute urinary retention: Suspect due to UTI given her dementia-not ideal to discharge her on Foley catheter, risk of trauma/recurrent UTI is high. Continue Flomax , Foley discontinued 5/3, voiding well   Anemia of chronic kidney disease Stable, monitor Recent Labs  Lab 01/01/24 1434 01/02/24 1117  HGB 12.0 11.7*  HCT 39.4 38.2    .  T2DM- IDDM: Blood sugar is well-controlled A1c 6.9.  Has not needed insulin  here, will continue to hold off on insulin  seems onlong-acting at home, advised to check blood sugar if she starts to go up 180-200 may need to resume lower dose insulin , daughter is aware and she will follow-up with PCP Recent Labs  Lab 01/01/24 2154 01/01/24 2245 01/05/24 1726 01/05/24 2027 01/05/24 2356 01/06/24 0415 01/06/24 0804  GLUCAP  --    < > 116* 102* 98 100* 107*  HGBA1C 6.9*  --   --   --   --   --   --    < > = values in this interval not displayed.    CAD Hypertension Hyperlipidemia: Stable no chest pain BP is  well-controlled.  Continue home aspirin  81, Toprol  decreased to 75, pravastatin , Zetia   Morbid obesity w/ Body mass index is 51.49 kg/m.: Will benefit with PCP follow-up, weight loss,healthy lifestyle and outpatient sleep eval if not done.  Goals of care: Seen by palliative care, continue DNR/DNI, overall prognosis guarded.  Daughter has been aware  about hospice and palliative care and they are not ready for hospice yet.  Plan is for discharge to home with home health.  Advised to follow-up with outpatient palliative care  Discharge Exam: Vitals:   01/06/24 0416 01/06/24 0803  BP: 108/75 (!) 140/86  Pulse: 80 63  Resp: 16 18  Temp:  (!) 97.4 F (36.3 C)  SpO2: 95% 100%   General: Pt is alert, awake, not in acute distress Cardiovascular: RRR, S1/S2 +, no rubs, no gallops Respiratory: CTA bilaterally, no wheezing, no rhonchi Abdominal: Soft, NT, ND, bowel sounds + Extremities: no edema, no cyanosis  Discharge Instructions  Discharge Instructions     Discharge instructions   Complete by: As directed    Please call call MD or return to ER for similar or worsening recurring problem that brought you to hospital or if any fever,nausea/vomiting,abdominal pain, uncontrolled pain, chest pain,  shortness of breath or any other alarming symptoms.  Please follow-up your doctor as instructed in a week time and call the office for appointment.  Please avoid alcohol, smoking, or any other illicit substance and maintain healthy habits including taking your regular medications as prescribed.  You were cared for by a hospitalist during your hospital stay. If you have any questions about your discharge medications or the care you received while you were in the hospital after you are discharged, you can call the unit and ask to speak with the hospitalist on call if the hospitalist that took care of you is not available.  Once you are discharged, your primary care physician will handle any further medical issues. Please note that NO REFILLS for any discharge medications will be authorized once you are discharged, as it is imperative that you return to your primary care physician (or establish a relationship with a primary care physician if you do not have one) for your aftercare needs so that they can reassess your need for medications and monitor  your lab values   Check blood sugar 3 times a day and bedtime at home. If blood sugar running above 200 less than 70 please call your MD to adjust insulin . If blood sugars running less 100 do not use insulin  and call MD. Discussed with MD before resuming insulin  If you noticed signs and symptoms of hypoglycemia or low blood sugar like jitteriness, confusion, thirst, tremor, sweating- Check blood sugar, drink sugary drink/biscuits/sweets to increase sugar level and call MD or return to ER.   Face-to-face encounter (required for Medicare/Medicaid patients)   Complete by: As directed    I Lesa Rape certify that this patient is under my care and that I, or a nurse practitioner or physician's assistant working with me, had a face-to-face encounter that meets the physician face-to-face encounter requirements with this patient on 01/06/2024. The encounter with the patient was in whole, or in part for the following medical condition(s) which is the primary reason for home health care (List medical condition): weakness, dementia   The encounter with the patient was in whole, or in part, for the following medical condition, which is the primary reason for home health care: deconditiong, weak   I certify that, based on  my findings, the following services are medically necessary home health services: Physical therapy   Reason for Medically Necessary Home Health Services: Therapy- Home Adaptation to Facilitate Safety   My clinical findings support the need for the above services: Unsafe ambulation due to balance issues   Further, I certify that my clinical findings support that this patient is homebound due to: Unable to leave home safely without assistance   Home Health   Complete by: As directed    To provide the following care/treatments:  PT OT Home Health Aide     Increase activity slowly   Complete by: As directed       Allergies as of 01/06/2024       Reactions   Amoxicillin Nausea Only   Has  patient had a PCN reaction causing immediate rash, facial/tongue/throat swelling, SOB or lightheadedness with hypotension: "yes, i was dizzy" Has patient had a PCN reaction causing severe rash involving mucus membranes or skin necrosis: no Did a PCN reaction that required hospitalization : no, called the DR. Did PCN reaction occurring within the last 10 years: unk if all of the above answers are "NO", then may proceed with Cephalosporin use.   Canagliflozin Nausea Only, Other (See Comments)   Dizziness Dizziness  Dizziness   Enalapril  Maleate    Other reaction(s): angioedema Other Reaction(s): Other (See Comments)   Liraglutide    Other reaction(s): Nausea and dizziness   Pioglitazone    Other reaction(s): macular edema   Tramadol Hcl    Other reaction(s): nausea, headache and dizziness        Medication List     STOP taking these medications    amLODipine  5 MG tablet Commonly known as: NORVASC    Cholecalciferol 50 MCG (2000 UT) Tabs   insulin  glargine 100 UNIT/ML injection Commonly known as: LANTUS    isosorbide  mononitrate 60 MG 24 hr tablet Commonly known as: IMDUR    latanoprost  0.005 % ophthalmic solution Commonly known as: XALATAN    losartan  25 MG tablet Commonly known as: COZAAR    multivitamin with minerals Tabs tablet   oxyCODONE  5 MG immediate release tablet Commonly known as: Oxy IR/ROXICODONE        TAKE these medications    (feeding supplement) PROSource Plus liquid Take 30 mLs by mouth 2 (two) times daily between meals.   acetaminophen  325 MG tablet Commonly known as: TYLENOL  Take 650 mg by mouth every 6 (six) hours as needed for mild pain (pain score 1-3), moderate pain (pain score 4-6) or fever.   allopurinol  100 MG tablet Commonly known as: ZYLOPRIM  Take 100 mg by mouth daily.   aspirin  EC 81 MG tablet Commonly known as: Aspirin  Low Dose Take 1 tablet (81 mg total) by mouth daily.   cefadroxil 500 MG capsule Commonly known as:  DURICEF Take 1 capsule (500 mg total) by mouth 2 (two) times daily for 2 days.   escitalopram  10 MG tablet Commonly known as: LEXAPRO  TAKE 2 TABLETS BY MOUTH EVERY DAY   esomeprazole  20 MG capsule Commonly known as: NEXIUM  Take 1 capsule (20 mg total) by mouth daily at 12 noon.   ezetimibe  10 MG tablet Commonly known as: ZETIA  Take 10 mg by mouth daily.   ferrous sulfate  325 (65 FE) MG tablet Take 325 mg by mouth at bedtime.   hydrALAZINE  25 MG tablet Commonly known as: APRESOLINE  Take 1 tablet (25 mg total) by mouth 3 (three) times daily with meals as needed (for sbp >160). What changed:  when to take  this reasons to take this   meclizine  12.5 MG tablet Commonly known as: ANTIVERT  Take 12.5 mg by mouth 3 (three) times daily as needed for dizziness.   metoprolol  succinate 25 MG 24 hr tablet Commonly known as: TOPROL -XL Take 3 tablets (75 mg total) by mouth daily. Take with or immediately following a meal. Start taking on: Jan 07, 2024 What changed:  medication strength how much to take   nitroGLYCERIN  0.4 MG SL tablet Commonly known as: NITROSTAT  PLACE 1 TABLET UNDER THE TONGUE AS NEEDED FOR CHEST PAIN.   oxybutynin  10 MG 24 hr tablet Commonly known as: DITROPAN -XL Take 10 mg by mouth daily.   polyethylene glycol 17 g packet Commonly known as: MIRALAX  / GLYCOLAX  Take 17 g by mouth daily as needed for mild constipation.   pravastatin  40 MG tablet Commonly known as: PRAVACHOL  Take 1 tablet (40 mg total) by mouth daily.   QUEtiapine 50 MG tablet Commonly known as: SEROQUEL Take 1 tablet (50 mg total) by mouth at bedtime.   QUEtiapine 25 MG tablet Commonly known as: SEROQUEL Take 0.5 tablets (12.5 mg total) by mouth daily. Start taking on: Jan 07, 2024   REFRESH OP Place 1 drop into both eyes at bedtime.        Follow-up Information     [facility] Follow up.   Contact information: [address] [facility] [place]  [address]         Merl Star, MD Follow up in 1 week(s).   Specialty: Internal Medicine Contact information: [address] [place] [address] [phone]                Allergies  Allergen Reactions   Amoxicillin Nausea Only    Has patient had a PCN reaction causing immediate rash, facial/tongue/throat swelling, SOB or lightheadedness with hypotension: "yes, i was dizzy" Has patient had a PCN reaction causing severe rash involving mucus membranes or skin necrosis: no Did a PCN reaction that required hospitalization : no, called the DR. Did PCN reaction occurring within the last 10 years: unk if all of the above answers are "NO", then may proceed with Cephalosporin use.    Canagliflozin Nausea Only and Other (See Comments)    Dizziness  Dizziness  Dizziness   Enalapril  Maleate     Other reaction(s): angioedema  Other Reaction(s): Other (See Comments)   Liraglutide     Other reaction(s): Nausea and dizziness   Pioglitazone     Other reaction(s): macular edema   Tramadol Hcl     Other reaction(s): nausea, headache and dizziness    The results of significant diagnostics from this hospitalization (including imaging, microbiology, ancillary and laboratory) are listed below for reference.    Microbiology: Recent Results (from the past 240 hours)  Urine Culture     Status: Abnormal   Collection Time: 01/02/24 12:09 PM   Specimen: Urine, Clean Catch  Result Value Ref Range Status   Specimen Description URINE, CLEAN CATCH  Final   Special Requests   Final    Normal Performed at [facility], [address] [place], [place] [address]    Culture >=100,000 COLONIES/mL ESCHERICHIA COLI (A)  Final   Report Status 01/06/2024 FINAL  Final   Organism ID, Bacteria ESCHERICHIA COLI (A)  Final      Susceptibility   Escherichia coli - MIC*    AMPICILLIN >=32 RESISTANT Resistant     CEFAZOLIN  <=4 SENSITIVE Sensitive     CEFEPIME <=0.12  SENSITIVE Sensitive      CEFTRIAXONE  <=0.25 SENSITIVE Sensitive     CIPROFLOXACIN  <=0.25 SENSITIVE Sensitive     GENTAMICIN <=1 SENSITIVE Sensitive     IMIPENEM <=0.25 SENSITIVE Sensitive     NITROFURANTOIN <=16 SENSITIVE Sensitive     TRIMETH/SULFA >=320 RESISTANT Resistant     AMPICILLIN/SULBACTAM 16 INTERMEDIATE Intermediate     PIP/TAZO <=4 SENSITIVE Sensitive ug/mL    * >=100,000 COLONIES/mL ESCHERICHIA COLI    Procedures/Studies: DG Abd 1 View Result Date: 01/01/2024 CLINICAL DATA:  Constipation EXAM: ABDOMEN - 1 VIEW COMPARISON:  None Available. FINDINGS: The bowel gas pattern is normal. Small to moderate stool burden the ascending colon. Relatively decompressed distal colon and rectum. No radio-opaque calculi or other significant radiographic abnormality are seen. IMPRESSION: 1. Nonobstructive bowel gas pattern. Electronically Signed   By: Worthy Heads M.D.   On: 01/01/2024 21:35   DG Chest Portable 1 View Result Date: 01/01/2024 EXAM: 1 VIEW XRAY OF THE CHEST 01/01/2024 07:28:38 PM COMPARISON: 06/09/2020 CLINICAL HISTORY: AMS. Patient BIB [facility] from home for malodorous urine and concerns for UTI. Patient lives with family, family called other family who called PCP, PCP recommended send out. Patient has hx of dementia with agitation and hallucinations, A\T\Ox2 at baseline. FINDINGS: LUNGS AND PLEURA: No consolidation. No pulmonary edema. No pleural effusion. No pneumothorax. HEART AND MEDIASTINUM: No acute abnormality of the cardiac and mediastinal silhouettes. Thoracic aortic atherosclerosis. BONES AND SOFT TISSUES: No acute osseous abnormality. IMPRESSION: 1. No acute findings. Electronically signed by: Zadie Herter MD 01/01/2024 07:32 PM EDT RP Workstation: [id number]   CT Head Wo Contrast Result Date: 01/01/2024 CLINICAL DATA:  Mental status change, unknown cause malodorous urine and concerns for UTI. Patient lives with family, family called other family who called PCP, PCP recommended send out. EXAM: CT  HEAD WITHOUT CONTRAST TECHNIQUE: Contiguous axial images were obtained from the base of the skull through the vertex without intravenous contrast. RADIATION DOSE REDUCTION: This exam was performed according to the departmental dose-optimization program which includes automated exposure control, adjustment of the mA and/or kV according to patient size and/or use of iterative reconstruction technique. COMPARISON:  CT head 09/25/2023. FINDINGS: Brain: Patchy and confluent areas of decreased attenuation are noted throughout the deep and periventricular white matter of the cerebral hemispheres bilaterally, compatible with chronic microvascular ischemic disease. No evidence of large-territorial acute infarction. No parenchymal hemorrhage. No mass lesion. No extra-axial collection. No mass effect or midline shift. No hydrocephalus. Basilar cisterns are patent. Partial empty sella. Vascular: No hyperdense vessel. Atherosclerotic calcifications are present within the cavernous internal carotid arteries. Skull: No acute fracture or focal lesion. Sinuses/Orbits: Paranasal sinuses and mastoid air cells are clear. Bilateral lens replacement. Otherwise the orbits are unremarkable. Other: None. IMPRESSION: 1. No acute intracranial abnormality. 2. Partial empty sella. Findings is often a normal anatomic variant but can be associated with idiopathic intracranial hypertension (pseudotumor cerebri). Electronically Signed   By: Morgane  Naveau M.D.   On: 01/01/2024 18:22    Labs: BNP (last 3 results) No results for input(s): "BNP" in the last 8760 hours. Basic Metabolic Panel: Recent Labs  Lab 01/01/24 1540 01/02/24 1117 01/03/24 0655 01/04/24 0757 01/05/24 0650  NA 135 138 139 139 136  K 3.7 3.4* 3.4* 3.5 3.4*  CL 91* 95* 97* 98 97*  CO2 29 29 28 30 29   GLUCOSE 88 113* 111* 108* 118*  BUN 28* 22 22 18 17   CREATININE 2.43* 1.91* 1.99* 1.86* 1.67*  CALCIUM  9.6 9.4 9.0  9.0 9.3  MG  --  1.9  --   --   --   PHOS  --   3.3  --   --   --    Liver Function Tests: Recent Labs  Lab 01/01/24 1540 01/02/24 1117  AST 20 19  ALT 6 8  ALKPHOS 76 71  BILITOT 0.8 0.7  PROT 7.5 7.2  ALBUMIN 3.0* 2.8*   No results for input(s): "LIPASE", "AMYLASE" in the last 168 hours. No results for input(s): "AMMONIA" in the last 168 hours. CBC: Recent Labs  Lab 01/01/24 1434 01/02/24 1117  WBC 7.4 6.1  HGB 12.0 11.7*  HCT 39.4 38.2  MCV 84.9 84.0  PLT 247 244   Cardiac Enzymes: No results for input(s): "CKTOTAL", "CKMB", "CKMBINDEX", "TROPONINI" in the last 168 hours. BNP: Invalid input(s): "POCBNP" CBG: Recent Labs  Lab 01/05/24 1726 01/05/24 2027 01/05/24 2356 01/06/24 0415 01/06/24 0804  GLUCAP 116* 102* 98 100* 107*   D-Dimer No results for input(s): "DDIMER" in the last 72 hours. Hgb A1c No results for input(s): "HGBA1C" in the last 72 hours. Lipid Profile No results for input(s): "CHOL", "HDL", "LDLCALC", "TRIG", "CHOLHDL", "LDLDIRECT" in the last 72 hours. Thyroid  function studies No results for input(s): "TSH", "T4TOTAL", "T3FREE", "THYROIDAB" in the last 72 hours.  Invalid input(s): "FREET3" Anemia work up No results for input(s): "VITAMINB12", "FOLATE", "FERRITIN", "TIBC", "IRON", "RETICCTPCT" in the last 72 hours. Urinalysis    Component Value Date/Time   COLORURINE YELLOW 01/02/2024 1053   APPEARANCEUR HAZY (A) 01/02/2024 1053   LABSPEC 1.010 01/02/2024 1053   PHURINE 5.0 01/02/2024 1053   GLUCOSEU NEGATIVE 01/02/2024 1053   HGBUR NEGATIVE 01/02/2024 1053   BILIRUBINUR NEGATIVE 01/02/2024 1053   KETONESUR NEGATIVE 01/02/2024 1053   PROTEINUR NEGATIVE 01/02/2024 1053   UROBILINOGEN 0.2 09/11/2011 1525   NITRITE NEGATIVE 01/02/2024 1053   LEUKOCYTESUR TRACE (A) 01/02/2024 1053   Sepsis Labs Recent Labs  Lab 01/01/24 1434 01/02/24 1117  WBC 7.4 6.1   Microbiology Recent Results (from the past 240 hours)  Urine Culture     Status: Abnormal   Collection Time: 01/02/24  12:09 PM   Specimen: Urine, Clean Catch  Result Value Ref Range Status   Specimen Description URINE, CLEAN CATCH  Final   Special Requests   Final    Normal Performed at [facility], [address] [place], [place] [address]    Culture >=100,000 COLONIES/mL ESCHERICHIA COLI (A)  Final   Report Status 01/06/2024 FINAL  Final   Organism ID, Bacteria ESCHERICHIA COLI (A)  Final      Susceptibility   Escherichia coli - MIC*    AMPICILLIN >=32 RESISTANT Resistant     CEFAZOLIN  <=4 SENSITIVE Sensitive     CEFEPIME <=0.12 SENSITIVE Sensitive     CEFTRIAXONE  <=0.25 SENSITIVE Sensitive     CIPROFLOXACIN  <=0.25 SENSITIVE Sensitive     GENTAMICIN <=1 SENSITIVE Sensitive     IMIPENEM <=0.25 SENSITIVE Sensitive     NITROFURANTOIN <=16 SENSITIVE Sensitive     TRIMETH/SULFA >=320 RESISTANT Resistant     AMPICILLIN/SULBACTAM 16 INTERMEDIATE Intermediate     PIP/TAZO <=4 SENSITIVE Sensitive ug/mL    * >=100,000 COLONIES/mL ESCHERICHIA COLI     Time coordinating discharge: 35 minutes  SIGNED: Lesa Rape, MD  [facility] 01/06/2024, 12:00 PM  If 7PM-7AM, please contact night-coverage [email or web address]

## 2024-01-06 NOTE — Discharge Instructions (Signed)
 Check blood sugar 3 times a day and bedtime at home. If blood sugar running above 200 less than 70 please call your MD to adjust insulin . If blood sugars running less 100 do not use insulin  and call MD. Discussed with MD before resuming insulin  If you noticed signs and symptoms of hypoglycemia or low blood sugar like jitteriness, confusion, thirst, tremor, sweating- Check blood sugar, drink sugary drink/biscuits/sweets to increase sugar level and call MD or return to ER.

## 2024-01-07 ENCOUNTER — Telehealth: Payer: Self-pay | Admitting: Neurology

## 2024-01-07 NOTE — Telephone Encounter (Signed)
 Pts daughter returned call and stated that the pt is sleeping through the night since prescribed seroquel at discharge l and seems to be doing so much better since on the med but pts daughter noticed that she was sleeping to much so she didn't give the seroquel 50mg  at night.she3 is self adjusting based on sleep patterns. Routing to Dr. Samara Crest to see if hospital followup is needed. She did have uti but still having hallucinations and daughter is concerned may need to see Dr. Samara Crest sooner rather than later.

## 2024-01-07 NOTE — Telephone Encounter (Signed)
 Call to patient, no answer. Left message to return call

## 2024-01-07 NOTE — Telephone Encounter (Signed)
 May 5 at 12:05 Pt Daughter(Sandy) LVM stating that she had spoke to April and was informed to call once PT has been Discharged from hospital. Daughter  states Mom has  been discharged  would like April to give her a call to follow up.    [place] [phone]

## 2024-01-09 NOTE — Telephone Encounter (Signed)
 Was able to schedule with daughter  Pt daughter voiced gratitude and understanding

## 2024-01-09 NOTE — Telephone Encounter (Signed)
 I reviewed the discharge summary, somnolence, hallucinations associated with the UTI.We can schedule a routine follow up.

## 2024-01-24 ENCOUNTER — Ambulatory Visit: Admitting: Podiatry

## 2024-01-27 ENCOUNTER — Other Ambulatory Visit: Payer: Self-pay | Admitting: Cardiology

## 2024-01-27 DIAGNOSIS — I2510 Atherosclerotic heart disease of native coronary artery without angina pectoris: Secondary | ICD-10-CM

## 2024-01-30 DIAGNOSIS — Z0289 Encounter for other administrative examinations: Secondary | ICD-10-CM

## 2024-01-31 ENCOUNTER — Telehealth: Payer: Self-pay | Admitting: Neurology

## 2024-01-31 MED ORDER — QUETIAPINE FUMARATE 25 MG PO TABS: 12.5000 mg | ORAL_TABLET | Freq: Every day | ORAL | 0 refills | Status: DC

## 2024-01-31 MED ORDER — QUETIAPINE FUMARATE 50 MG PO TABS: 50.0000 mg | ORAL_TABLET | Freq: Every day | ORAL | 0 refills | Status: DC

## 2024-01-31 NOTE — Telephone Encounter (Signed)
 Pt's daughter, Jesalyn Finazzo calling in regards to refills for QUEtiapine  (SEROQUEL ) 25 MG tablet and QUEtiapine  (SEROQUEL ) 50 MG tablet. Received prescriptions for medications while was in hospital. Patient needing a refill for both medications. Would like a call back once refills have been sent to  [id number]  . Thank you

## 2024-01-31 NOTE — Telephone Encounter (Signed)
 Received completed FMLA forms for patients daughter Jeanne [name] from [employer]. Called and LVM for Jeanne [name] as form states to return to her or can fax to [facility] at her request. If Jeanne [name] calls back, please send message to me.

## 2024-01-31 NOTE — Telephone Encounter (Signed)
 Called and informed daughter that the rx for seropuel's were sent and she voiced gratitude and understanding

## 2024-01-31 NOTE — Telephone Encounter (Signed)
 Done

## 2024-02-06 ENCOUNTER — Encounter: Payer: Self-pay | Admitting: Neurology

## 2024-02-06 ENCOUNTER — Ambulatory Visit: Admitting: Neurology

## 2024-02-06 VITALS — BP 113/72 | Resp 17 | Ht 62.0 in

## 2024-02-06 DIAGNOSIS — F02B18 Dementia in other diseases classified elsewhere, moderate, with other behavioral disturbance: Secondary | ICD-10-CM

## 2024-02-06 DIAGNOSIS — G301 Alzheimer's disease with late onset: Secondary | ICD-10-CM

## 2024-02-06 NOTE — Telephone Encounter (Signed)
 Received [employer] letter stating they need more information for the form. Bringing to [facility] 2

## 2024-02-06 NOTE — Patient Instructions (Signed)
 Continue current medication including Seroquel  12.5 mg in the morning and 50 mg at night If there is any increase in agitation, increasing hallucination, please rule out UTI first with PCP, if urine negative please contact the office for a follow-up appointment Return in 6 months or sooner if worse.    Alzheimer's Disease Alzheimer's disease is a disease that affects the way your brain works. It affects memory and causes changes in how you think, talk, and act. The disease gets worse over time. Alzheimer's disease is a form of dementia. What are the causes? Alzheimer's disease happens when a protein called beta-amyloid forms deposits in the brain. It's not known what causes these to form. The disease may also be caused by a harmful change in a gene that's passed down, or inherited, from one or both parents. Not everyone who gets the changed gene from their parents will get the disease. What increases the risk? Being older than 86 years of age. Being female. Having any of these conditions: High blood pressure. Diabetes. Heart or blood vessel disease. Smoking. Being very overweight. Having a brain injury or a stroke in the past. Having a family history of dementia. What are the signs or symptoms?  Symptoms may happen in three stages, which often overlap. Early stage In this stage, you can still do things on your own. You may still be able to drive, work, and be social. Symptoms in this stage include: Forgetting small things, like a name, words, or what you did recently. Having a hard time with: Paying attention or learning new things. Talking with people. Doing your usual tasks. Solving problems or doing math. Following instructions. Feeling worried or nervous. Not wanting to be around people. Losing interest in doing things. Moderate stage In this stage, you'll start to need care. Symptoms include: Trouble saying what you're thinking. Memory loss that affects daily life. This  can include forgetting: Things that happened recently. If you've taken medicines or eaten. Places you know. You may get lost while walking or driving. To pay bills. To bathe or use the bathroom. Being confused about where you are or what time it is. Trouble judging distance. Changes in how you feel or act. You may be moody, angry, upset, scared, worried, or suspicious. Not thinking clearly or making good choices. You may have delusions or false beliefs. Hallucinations. This means you see, hear, taste, smell, or feel things that aren't real. Severe stage In the severe stage, you'll need help with your personal care and daily activities. Symptoms include: Memory loss getting worse. Personality changes. Not knowing where you are. Physical problems, like trouble walking, sitting, or swallowing. More trouble talking with others. Not being able to control when you pee (urinate) or poop. More changes in how you act. How is this diagnosed? You may need to see a nervous system specialist, called a neurologist, or a health care provider who focuses on the care of older adults. Alzheimer's disease may be diagnosed based on: Your symptoms and medical history. Your provider will talk with you and your family, friends, and caregivers about your history and symptoms. A physical exam. Tests. These may include: Lab tests, such as tests on your blood or pee. Imaging tests. You may have a CT scan, a PET scan, or an MRI. A lumbar puncture. For this test, a sample of the fluid around the brain and spinal cord is taken and tested. Tests to check your thinking and memory. Genetic testing. This may be done if you  get the disease before age 55 or if other family members have had the disease. How is this treated? At this time, there's no cure for Alzheimer's disease. The goals of treatment are to: Manage symptoms that affect behavior. Make sure you're safe at home. Help manage daily life for you and your  caregivers. Treatment may include: Medicines. You may get medicines that can: Slow down how fast the disease gets worse. Help with memory and behavior. Cognitive therapy. This gives you support, training to help with thinking skills, and memory aids. Counseling or spiritual guidance. These can help you deal with the many feelings you may have, such as fear, anger, or feeling alone. Caregivers. These are people who help you with your daily tasks. Family support groups. These allow your family members to learn about the disease, get emotional support, and find out about resources to help take care of you. Follow these instructions at home:  Medicines Take over-the-counter and prescription medicines only as told by your provider. Use a pill organizer or pill reminder to help you keep track of your medicines. Avoid taking medicines that can affect thinking, such as medicines for pain or sleep. Lifestyle Make healthy choices: Be active as told by your provider. Regular exercise may help with symptoms. Do not smoke, vape, or use products with nicotine or tobacco in them. Do not drink alcohol. Eat a healthy diet. When you feel a lot of stress, do something that helps you relax. Try things like yoga or deep breathing. Spend time with people. Drink enough fluid to keep your pee pale yellow. Make sure you sleep well. These tips can help: Try not to take long naps during the day. Take short naps of 30 minutes or less if needed. Keep your bedroom dark and cool. Do not exercise during the few hours before you go to bed. Avoid caffeine products in the afternoon and evening. General instructions Work with your provider to decide: What things you need help with. What your safety needs are. If you were given a bracelet that tracks where you are and shows that you're a person with memory loss, make sure you wear it at all times. Talk with your provider about whether it's safe for you to drive. Work  with your family to make big legal or health decisions. This may include things like advance directives, medical power of attorney, or a living will. Where to find more information There are two ways to contact the [employer]: Call the 24-hour helpline at 215-031-7687. Visit [employer] Contact a health care provider if: Your medicine causes you to have nausea, vomiting, or trouble with eating. You have mood or behavior changes that are getting worse, such as feeling depressed, worried, or nervous. You have hallucinations. You or your family members are worried about your safety. You're hard to wake up. Your memory suddenly gets worse. Get help right away if: You feel like you may hurt yourself or others. You have thoughts about taking your own life. Take one of these steps: Go to your nearest emergency room. Call 911. Call the National Suicide Prevention Lifeline at 915-864-7429 or 988. Text the Crisis Text Line at 303-189-5586. This information is not intended to replace advice given to you by your health care provider. Make sure you discuss any questions you have with your health care provider. Document Revised: 11/21/2022 Document Reviewed: 11/21/2022 [employer] Patient Education  2024 [employer].

## 2024-02-06 NOTE — Progress Notes (Signed)
 [facility]  PATIENT: Jeanne [name] DOB: [birth year]  REQUESTING CLINICIAN: Merl Star, MD HISTORY FROM: Patient and daughter REASON FOR VISIT: Dizziness/Cognitive impairment    HISTORICAL  CHIEF COMPLAINT:  Chief Complaint  Patient presents with   Hospitalization Follow-up    Rm12, daughters present, HOSPITAL FU - hallucinations and worsening symptoms: seeing children/babies, very agitated    INTERVAL HISTORY 02/06/2024 Discussed the use of AI scribe software for clinical note transcription with the patient, who gave verbal consent to proceed.  Patient presents today for follow up for worsening memory, increased agitation and confusion following a recent hospitalization. She is accompanied by her daughters. She has been experiencing increased agitation and confusion, which led to her hospitalization. Prior to the hospital visit, she became uncontrollable and was found to have a urinary tract infection (UTI). She was treated for the UTI in the hospital, and upon discharge, she was prescribed Seroquel  (quetiapine ) at a dose of 12.5 mg in the morning and 50 mg in the evening. She frequently asks about 'the children' and has recall from the past, often asking about deceased family members. These episodes occur both in the morning and evening. Her daughter reports that she sometimes does not close her eyes all night and has fallen when trying to get up, necessitating assistance from fire rescue. Her daughter notes that she has lost 33 pounds and has been taken off insulin  by her diabetic doctor. Her blood sugar is monitored, and her hemoglobin A1c is 6.9. She is not currently on any diabetes medication. She has a history of mild cognitive impairment, which has progressed to dementia. She experiences hallucinations, particularly involving children. She also has a history of kidney issues and heart problems. Her current medications include Seroquel , aspirin , Lexapro ,  and blood pressure medication. She is not taking any diabetes medication currently. She uses a urinary wick system to manage incontinence and prevent UTIs. Her daughter reports that she does not initiate personal care activities such as showering or changing clothes without prompting. She is also on a routine of being moved to a recliner during the day and using an exercise machine for circulation.       INTERVAL HISTORY 04/18/2023:  Patient presents today for follow-up, last visit was in April and since then she had a recent visit to the ED on July 11 for episode of vertigo and chest pain.  At that time workup including head CT was negative for any acute abnormality.  Since then she reports she is back to her normal self, she does have occasional vertigo and lightheadedness but not to the point of fall or difficulty walking or vomiting.  She is compliant with all of her medications, but daughter reported patient is not drinking enough fluid. She does report walking in her house using her rolling walker In terms of the memory they feel like he is stable.   INTERVAL HISTORY 12/11/2022:  Patient presents today for follow-up, she is accompanied by her daughter.  Last visit was in November.  At that time we obtained a brain MRI which showed no acute abnormality.  She did started physical therapy but self discontinued.  Daughter stated that since patient husband died, she has been down. Patient recognized that sometimes, she does not want to do anything. She was recently admitted to the hospital for chest pain.  She does report that she is still experiencing dizziness on standing and with walking.  She is using assistive device.  INTERVAL HISTORY 07/19/2022 Patient  presents today for follow-up, she is accompanied by her daughter.  Since last visit a month ago, she has been complaining of dizziness.  Patient reports the dizziness is constant but varies in intensity.  Dizziness is worse with head movement and  also worse upon standing and walking.  She denies any falls, denies any headaches.  She describes dizziness as woozy feeling, lightheadedness.  Whenever she had these events, she has to sit and wait for the episode to pass.  She reports that meclizine  has been helping if she takes it.   INTERVAL HISTORY 06/21/22: Patient presents today for follow-up, she is accompanied by her daughter.  Last visit was in August 10.  At that time I did start her for on Aricept  for mild cognitive impairment.  She could not tolerate the medication due to vivid dreams therefore it was discontinued.  She still complains of dizziness that she describes as wooziness with position change.  There is also increased anxiety reported by patient and family.  She does not want to be alone.  Other than that there has not been any worsening of her mental status or cognition.  Overall she is stable.   HISTORY OF PRESENT ILLNESS:  This is a 88 year old woman past medical history of obesity, hypertension, hyperlipidemia, diabetes, glaucoma, CAD and CKD who is presenting for dizziness and daughter also reports cognitive impairment.  In terms of her dizziness, patient reports on July 20 while taking a shower she bent down to pick up the soap and when she got up she felt extreme dizziness that she have to sit down.  Her symptoms did not improve therefore she presented to the ED.  In the ED she had a head CT which was negative for any acute stroke and patient was discharged home with dizziness and Meclizine .  She reports dizziness at that time was positional, worse with head movement.  She has been on meclizine  3 times daily since July 20, reports improvement of her symptoms but stated that she will feel woozy when moving her head.  Denies any fall, denies any hearing loss denies any worsening tinnitus.    Her daughter who is present on the visit has concerns about her memory.  She reports that patient is forgetful, she forgets about recent  conversation, but she cannot provide good recall of her childhood memory and she also noted that she is repetitive.  Patient is not driving because she got lost while driving and since then she has decided not to drive.  Patient currently she is living with both of her daughters, she is not cooking or cleaning but states that she will walk to one room and forget the reason why she was in the room in the first place.  Daughters are handling her bills but she is able to take care of herself, she is able to bathe herself, dress herself without any help.  She does walk with the cane.   She does complaints of changes in her vision but has an appointment with ophthalmologist in a couple months    OTHER MEDICAL CONDITIONS: Diabetes, Hypertension, Hypertension, Glaucoma, CKD, CAD    REVIEW OF SYSTEMS: Full 14 system review of systems performed and negative with exception of: as noted in the HPI   ALLERGIES: Allergies  Allergen Reactions   Amoxicillin Nausea Only    Has patient had a PCN reaction causing immediate rash, facial/tongue/throat swelling, SOB or lightheadedness with hypotension: "yes, i was dizzy" Has patient had a PCN reaction  causing severe rash involving mucus membranes or skin necrosis: no Did a PCN reaction that required hospitalization : no, called the DR. Did PCN reaction occurring within the last 10 years: unk if all of the above answers are "NO", then may proceed with Cephalosporin use.    Canagliflozin Nausea Only and Other (See Comments)    Dizziness  Dizziness  Dizziness  Other Reaction(s): dizziness   Enalapril  Maleate     Other reaction(s): angioedema  Other Reaction(s): Other (See Comments)   Liraglutide     Other reaction(s): Nausea and dizziness   Pioglitazone     Other reaction(s): macular edema   Tramadol Hcl     Other reaction(s): nausea, headache and dizziness    HOME MEDICATIONS: Outpatient Medications Prior to Visit  Medication Sig Dispense Refill    acetaminophen  (TYLENOL ) 325 MG tablet Take 650 mg by mouth every 6 (six) hours as needed for mild pain (pain score 1-3), moderate pain (pain score 4-6) or fever.     allopurinol  (ZYLOPRIM ) 100 MG tablet Take 100 mg by mouth daily.     aspirin  EC (ASPIRIN  LOW DOSE) 81 MG tablet Take 1 tablet (81 mg total) by mouth daily. 90 tablet 0   escitalopram  (LEXAPRO ) 10 MG tablet TAKE 2 TABLETS BY MOUTH EVERY DAY 180 tablet 2   esomeprazole  (NEXIUM ) 20 MG capsule Take 1 capsule (20 mg total) by mouth daily at 12 noon. 90 capsule 3   ezetimibe  (ZETIA ) 10 MG tablet Take 10 mg by mouth daily.     ferrous sulfate  325 (65 FE) MG tablet Take 325 mg by mouth at bedtime.     hydrALAZINE  (APRESOLINE ) 25 MG tablet Take 1 tablet (25 mg total) by mouth 3 (three) times daily with meals as needed (for sbp >160).     meclizine  (ANTIVERT ) 12.5 MG tablet Take 12.5 mg by mouth 3 (three) times daily as needed for dizziness.     metoprolol  succinate (TOPROL -XL) 25 MG 24 hr tablet Take 3 tablets (75 mg total) by mouth daily. Take with or immediately following a meal. 30 tablet 0   nitroGLYCERIN  (NITROSTAT ) 0.4 MG SL tablet PLACE 1 TABLET UNDER THE TONGUE AS NEEDED FOR CHEST PAIN. 25 tablet 3   Nutritional Supplements (,FEEDING SUPPLEMENT, PROSOURCE PLUS) liquid Take 30 mLs by mouth 2 (two) times daily between meals.     oxybutynin  (DITROPAN -XL) 10 MG 24 hr tablet Take 10 mg by mouth daily.     polyethylene glycol (MIRALAX  / GLYCOLAX ) 17 g packet Take 17 g by mouth daily as needed for mild constipation. 14 each 0   Polyvinyl Alcohol-Povidone (REFRESH OP) Place 1 drop into both eyes at bedtime.     pravastatin  (PRAVACHOL ) 40 MG tablet TAKE 1 TABLET BY MOUTH EVERY DAY 90 tablet 1   QUEtiapine  (SEROQUEL ) 25 MG tablet Take 0.5 tablets (12.5 mg total) by mouth daily. 15 tablet 0   QUEtiapine  (SEROQUEL ) 50 MG tablet Take 1 tablet (50 mg total) by mouth at bedtime. 30 tablet 0   No facility-administered medications prior to visit.     PAST MEDICAL HISTORY: Past Medical History:  Diagnosis Date   Anemia    Arthritis    Carotid arterial disease (HCC)    COVID-19    Diabetes mellitus    Hyperlipidemia    Hypertension    Migraine    Obesity    Reflux    Sleep apnea     PAST SURGICAL HISTORY: Past Surgical History:  Procedure Laterality Date  CARDIAC CATHETERIZATION N/A 10/12/2015   Procedure: Left Heart Cath and Coronary Angiography;  Surgeon: Arty Binning, MD;  Location: [facility] INVASIVE CV LAB;  Service: Cardiovascular;  Laterality: N/A;   CHOLECYSTECTOMY  50 yrs ago   JOINT REPLACEMENT Right 2005   knee   LEFT HEART CATH AND CORONARY ANGIOGRAPHY N/A 12/07/2022   Procedure: LEFT HEART CATH AND CORONARY ANGIOGRAPHY;  Surgeon: Cody Das, MD;  Location: [facility] INVASIVE CV LAB;  Service: Cardiovascular;  Laterality: N/A;   ORIF FEMUR FRACTURE Right 08/31/2023   Procedure: OPEN REDUCTION INTERNAL FIXATION (ORIF) DISTAL FEMUR FRACTURE;  Surgeon: Laneta Pintos, MD;  Location: [facility] OR;  Service: Orthopedics;  Laterality: Right;   WISDOM TOOTH EXTRACTION      FAMILY HISTORY: Family History  Problem Relation Age of Onset   Heart attack Father    Heart attack Mother     SOCIAL HISTORY: Social History   Socioeconomic History   Marital status: Widowed    Spouse name: Not on file   Number of children: 2   Years of education: Not on file   Highest education level: Not on file  Occupational History   Not on file  Tobacco Use   Smoking status: Never   Smokeless tobacco: Never  Vaping Use   Vaping status: Never Used  Substance and Sexual Activity   Alcohol use: Never   Drug use: No   Sexual activity: Not on file  Other Topics Concern   Not on file  Social History Narrative   Not on file   Social Drivers of Health   Financial Resource Strain: Not on file  Food Insecurity: No Food Insecurity (01/01/2024)   Hunger Vital Sign    Worried About Running Out of Food in the Last Year: Never true    Ran  Out of Food in the Last Year: Never true  Transportation Needs: No Transportation Needs (01/01/2024)   PRAPARE - Administrator, Civil Service (Medical): No    Lack of Transportation (Non-Medical): No  Physical Activity: Not on file  Stress: Not on file  Social Connections: Moderately Isolated (01/01/2024)   Social Connection and Isolation Panel [NHANES]    Frequency of Communication with Friends and Family: Never    Frequency of Social Gatherings with Friends and Family: More than three times a week    Attends Religious Services: Never    Database administrator or Organizations: Yes    Attends Banker Meetings: Never    Marital Status: Widowed  Intimate Partner Violence: Patient Unable To Answer (01/01/2024)   Humiliation, Afraid, Rape, and Kick questionnaire    Fear of Current or Ex-Partner: Patient unable to answer    Emotionally Abused: Patient unable to answer    Physically Abused: Patient unable to answer    Sexually Abused: Patient unable to answer    PHYSICAL EXAM  GENERAL EXAM/CONSTITUTIONAL: Vitals:  Vitals:   02/06/24 1021  BP: 113/72  Resp: 17  SpO2: 92%  Height: 5\' 2"  (1.575 m)    Body mass index is 54.87 kg/m. Wt Readings from Last 3 Encounters:  01/01/24 300 lb (136.1 kg)  11/01/23 (!) 322 lb (146.1 kg)  08/30/23 (!) 306 lb 7 oz (139 kg)   Patient is in no distress; well developed, nourished and groomed; neck is supple.   MUSCULOSKELETAL: Gait, strength, tone, movements noted in Neurologic exam below  NEUROLOGIC: MENTAL STATUS:      No data to display  04/18/2023   10:12 AM 04/13/2022   11:21 AM  Montreal Cognitive Assessment   Visuospatial/ Executive (0/5) 3 3  Naming (0/3) 3 2  Attention: Read list of digits (0/2) 1 1  Attention: Read list of letters (0/1) 1 1  Attention: Serial 7 subtraction starting at 100 (0/3) 2 3  Language: Repeat phrase (0/2) 1 2  Language : Fluency (0/1) 1 1  Abstraction (0/2) 2 2   Delayed Recall (0/5) 0 0  Orientation (0/6) 5 6  Total 19 21  Adjusted Score (based on education) 19 21    CRANIAL NERVE:  2nd, 3rd, 4th, 6th - visual fields full to confrontation, extraocular muscles intact, no nystagmus 5th - facial sensation symmetric 7th - facial strength symmetric 8th - hearing intact 9th - palate elevates symmetrically, uvula midline 11th - shoulder shrug symmetric 12th - tongue protrusion midline  MOTOR:  normal bulk and tone, full strength in the BUE, BLE  SENSORY:  normal and symmetric to light touch  COORDINATION:  finger-nose-finger, fine finger movements normal  GAIT/STATION:  Deferred using a wheelchair    DIAGNOSTIC DATA (LABS, IMAGING, TESTING) - I reviewed patient records, labs, notes, testing and imaging myself where available.  Lab Results  Component Value Date   WBC 6.1 01/02/2024   HGB 11.7 (L) 01/02/2024   HCT 38.2 01/02/2024   MCV 84.0 01/02/2024   PLT 244 01/02/2024      Component Value Date/Time   NA 136 01/05/2024 0650   K 3.4 (L) 01/05/2024 0650   CL 97 (L) 01/05/2024 0650   CO2 29 01/05/2024 0650   GLUCOSE 118 (H) 01/05/2024 0650   BUN 17 01/05/2024 0650   CREATININE 1.67 (H) 01/05/2024 0650   CREATININE 1.63 (H) 01/27/2020 1044   CALCIUM  9.3 01/05/2024 0650   PROT 7.2 01/02/2024 1117   ALBUMIN 2.8 (L) 01/02/2024 1117   AST 19 01/02/2024 1117   AST 12 (L) 01/27/2020 1044   ALT 8 01/02/2024 1117   ALT 9 01/27/2020 1044   ALKPHOS 71 01/02/2024 1117   BILITOT 0.7 01/02/2024 1117   BILITOT 0.3 01/27/2020 1044   GFRNONAA 30 (L) 01/05/2024 0650   GFRNONAA 29 (L) 01/27/2020 1044   GFRAA 34 (L) 01/27/2020 1044   Lab Results  Component Value Date   CHOL 116 12/07/2022   HDL 39 (L) 12/07/2022   LDLCALC 62 12/07/2022   TRIG 73 12/07/2022   CHOLHDL 3.0 12/07/2022   Lab Results  Component Value Date   HGBA1C 6.9 (H) 01/01/2024   Lab Results  Component Value Date   VITAMINB12 646 01/02/2024   Lab Results   Component Value Date   TSH 2.323 01/02/2024    Head CT 03/23/22 No acute intracranial findings are seen in noncontrast CT brain. Atrophy. Small-vessel disease   MRI Brain 08/06/2022 Multiple single and confluent T2/FLAIR hyperintense foci in the cerebral hemispheres in a pattern most consistent with moderate chronic microvascular ischemic changes.  A focus is also noted in the pons.  There has been some progression compared to the 2017 MRI. Mild generalized cortical atrophy, progressed since the 2017 MRI and stable compared to the 2023 CT scan. Single chronic microhemorrhage in the right hemisphere is stable compared to the 2017 MRI. Mild chronic sphenoid sinusitis The internal auditory canals appeared normal on this noncontrast study.   The pituitary gland has a reduced height within an enlarged sella turcica, unchanged compared to the 2017 MRI.  A partially empty sella is nonspecific and could  be incidental or due to elevated intracranial pressure.   No acute finding   Head CT 03/15/2023 1. No acute intracranial abnormality. 2. Unchanged moderate chronic small-vessel disease.   ASSESSMENT AND PLAN  88 y.o. year old female with history of obesity, hypertension, hyperlipidemia, diabetes, glaucoma, CAD and CKD and MCI who is presenting for follow up for her dementia and hallucinations.     Dementia with behavioral disturbance   Progression from mild cognitive impairment to dementia, likely Alzheimer's type, includes hallucinations of children and past family members, along with agitation and confusion, especially during infections. Seroquel  (quetiapine ) is prescribed for hallucinations and agitation, with a current dose of 12.5 mg in the morning and 50 mg at night, despite a black box warning for increased mortality in dementia patients. Reorientation and distraction techniques are recommended over medication when possible. Memantine is contraindicated due to an enalapril  allergy. Will not  start Aricept  due potential side effect.  Continue Seroquel  at the current dosage, utilize reorientation and distraction techniques, and rule out infection if sudden changes in behavior or memory occur.  Urinary tract infection   A recent UTI caused increased confusion and agitation, successfully treated in the hospital. UTIs are common in older adults and can worsen dementia symptoms. Proper hygiene and dryness in the perineal area are essential to prevent recurrence. Monitor for signs of UTI and rule out infection if sudden changes in behavior or memory occur. Ensure proper hygiene and dryness in the perineal area to prevent UTIs.   Follow-up   The next appointment is scheduled for December or January. Immediate follow-up is advised if sudden changes in behavior or memory occur, with infection ruled out first. Schedule a follow-up appointment in December or January and contact the provider if sudden changes in behavior or memory occur after ruling out infection.        1. 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    Alzheimer's Disease Alzheimer's disease is a disease that affects the way your brain works. It affects memory and causes changes in how you think, talk, and act. The disease gets worse over time. Alzheimer's disease is a form of dementia. What are the causes? Alzheimer's disease happens when a protein called beta-amyloid forms deposits in the brain. It's not known what causes these to form. The disease may also be caused by a harmful change in a gene that's passed down, or inherited, from one or both parents. Not  everyone who gets the changed gene from their parents will get the disease. What increases the risk? Being older than 86 years of age. Being female. Having any of these conditions: High blood pressure. Diabetes. Heart or blood vessel disease. Smoking. Being very overweight. Having a brain injury or a stroke in the past. Having a family history of dementia. What are the signs or symptoms?  Symptoms may happen in three stages, which often overlap. Early stage In this stage, you can still do things on your own. You may still be able to drive, work, and be social. Symptoms in this stage include: Forgetting small things, like a name, words, or what you did recently. Having  a hard time with: Paying attention or learning new things. Talking with people. Doing your usual tasks. Solving problems or doing math. Following instructions. Feeling worried or nervous. Not wanting to be around people. Losing interest in doing things. Moderate stage In this stage, you'll start to need care. Symptoms include: Trouble saying what you're thinking. Memory loss that affects daily life. This can include forgetting: Things that happened recently. If you've taken medicines or eaten. Places you know. You may get lost while walking or driving. To pay bills. To bathe or use the bathroom. Being confused about where you are or what time it is. Trouble judging distance. Changes in how you feel or act. You may be moody, angry, upset, scared, worried, or suspicious. Not thinking clearly or making good choices. You may have delusions or false beliefs. Hallucinations. This means you see, hear, taste, smell, or feel things that aren't real. Severe stage In the severe stage, you'll need help with your personal care and daily activities. Symptoms include: Memory loss getting worse. Personality changes. Not knowing where you are. Physical problems, like trouble walking, sitting, or swallowing. More trouble  talking with others. Not being able to control when you pee (urinate) or poop. More changes in how you act. How is this diagnosed? You may need to see a nervous system specialist, called a neurologist, or a health care provider who focuses on the care of older adults. Alzheimer's disease may be diagnosed based on: Your symptoms and medical history. Your provider will talk with you and your family, friends, and caregivers about your history and symptoms. A physical exam. Tests. These may include: Lab tests, such as tests on your blood or pee. Imaging tests. You may have a CT scan, a PET scan, or an MRI. A lumbar puncture. For this test, a sample of the fluid around the brain and spinal cord is taken and tested. Tests to check your thinking and memory. Genetic testing. This may be done if you get the disease before age 63 or if other family members have had the disease. How is this treated? At this time, there's no cure for Alzheimer's disease. The goals of treatment are to: Manage symptoms that affect behavior. Make sure you're safe at home. Help manage daily life for you and your caregivers. Treatment may include: Medicines. You may get medicines that can: Slow down how fast the disease gets worse. Help with memory and behavior. Cognitive therapy. This gives you support, training to help with thinking skills, and memory aids. Counseling or spiritual guidance. These can help you deal with the many feelings you may have, such as fear, anger, or feeling alone. Caregivers. These are people who help you with your daily tasks. Family support groups. These allow your family members to learn about the disease, get emotional support, and find out about resources to help take care of you. Follow these instructions at home:  Medicines Take over-the-counter and prescription medicines only as told by your provider. Use a pill organizer or pill reminder to help you keep track of your  medicines. Avoid taking medicines that can affect thinking, such as medicines for pain or sleep. Lifestyle Make healthy choices: Be active as told by your provider. Regular exercise may help with symptoms. Do not smoke, vape, or use products with nicotine or tobacco in them. Do not drink alcohol. Eat a healthy diet. When you feel a lot of stress, do something that helps you relax. Try things like yoga or deep breathing. Spend  time with people. Drink enough fluid to keep your pee pale yellow. Make sure you sleep well. These tips can help: Try not to take long naps during the day. Take short naps of 30 minutes or less if needed. Keep your bedroom dark and cool. Do not exercise during the few hours before you go to bed. Avoid caffeine products in the afternoon and evening. General instructions Work with your provider to decide: What things you need help with. What your safety needs are. If you were given a bracelet that tracks where you are and shows that you're a person with memory loss, make sure you wear it at all times. Talk with your provider about whether it's safe for you to drive. Work with your family to make big legal or health decisions. This may include things like advance directives, medical power of attorney, or a living will. Where to find more information There are two ways to contact the [employer]: Call the 24-hour helpline at 818-107-1813. Visit [employer] Contact a health care provider if: Your medicine causes you to have nausea, vomiting, or trouble with eating. You have mood or behavior changes that are getting worse, such as feeling depressed, worried, or nervous. You have hallucinations. You or your family members are worried about your safety. You're hard to wake up. Your memory suddenly gets worse. Get help right away if: You feel like you may hurt yourself or others. You have thoughts about taking your own life. Take one of these steps: Go to  your nearest emergency room. Call 911. Call the National Suicide Prevention Lifeline at 501-762-4596 or 988. Text the Crisis Text Line at 413-860-6167. This information is not intended to replace advice given to you by your health care provider. Make sure you discuss any questions you have with your health care provider. Document Revised: 11/21/2022 Document Reviewed: 11/21/2022 Elsevier Patient Education  2024 Elsevier [employer].  No orders of the defined types were placed in this encounter.   No orders of the defined types were placed in this encounter.   Return in about 6 months (around 08/07/2024).  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 We also discussed medications, and their side effects.  Cassandra Cleveland, MD 02/06/2024, 4:20 PM  [facility] [address] [place], [place] [address] [phone]

## 2024-02-22 ENCOUNTER — Other Ambulatory Visit: Payer: Self-pay | Admitting: Neurology

## 2024-02-26 ENCOUNTER — Ambulatory Visit: Admitting: Neurology

## 2024-03-05 ENCOUNTER — Telehealth: Payer: Self-pay | Admitting: Neurology

## 2024-03-05 DIAGNOSIS — Z0289 Encounter for other administrative examinations: Secondary | ICD-10-CM

## 2024-03-05 NOTE — Telephone Encounter (Signed)
 Received FMLA PPW for pt's daughter to care for patient. Daughter [name] paid form fee, brought to POD 2 to complete.

## 2024-03-09 ENCOUNTER — Other Ambulatory Visit: Payer: Self-pay | Admitting: Cardiology

## 2024-03-09 DIAGNOSIS — R0789 Other chest pain: Secondary | ICD-10-CM

## 2024-03-11 NOTE — Telephone Encounter (Signed)
 Send to PCP.

## 2024-03-11 NOTE — Telephone Encounter (Signed)
 Copy emailed to pt's daughter Angelisa Winthrop at [email or web address]

## 2024-03-11 NOTE — Telephone Encounter (Signed)
 Yes please.  Regards, Dr. Elmira

## 2024-03-11 NOTE — Telephone Encounter (Signed)
 Completed FMLA form received. Faxed to [employer] at [phone]. Original and copy given to MR. Called and LVM for [name] to see if she wants a copy mailed or left at the front desk for pick up

## 2024-03-11 NOTE — Telephone Encounter (Signed)
 Pt of Dr. Elmira. Please advise on this RX. I'm not sure if it's cardiac or not.

## 2024-04-04 ENCOUNTER — Other Ambulatory Visit: Payer: Self-pay | Admitting: Cardiology

## 2024-04-04 DIAGNOSIS — R0789 Other chest pain: Secondary | ICD-10-CM

## 2024-05-19 ENCOUNTER — Telehealth: Payer: Self-pay | Admitting: Neurology

## 2024-05-19 NOTE — Telephone Encounter (Signed)
 Pt's daughter has called asking if pt can see a NP since Dr Gregg doesn't have anything before current appointment in January.  Daughter reports pt is not sleeping at night as she once did, and now has unusual eating schedule.  Phone rep scheduled appointment with NP but also informed pt's daughter this would be reviewed by NP with RN and likely Dr Gregg , and that she would be called if this appointment would need to be with Md and not NP(message routed to NP and POD for Dr Gregg)

## 2024-05-19 NOTE — Telephone Encounter (Signed)
 Called and spoke to pts daughter. Scheduled her for 05/27/24 at 1015am w/dr. Gregg and cancelled appt w/mccue as she wouldn't need both appts. Pt daughter voiced understanding and gratitude for all discussed

## 2024-05-19 NOTE — Telephone Encounter (Signed)
 I am more than happy to see patient to discuss this if needed but may just be able to adjust medication without having to bring patient in to office as she was just seen in June but will defer this to Dr. Gregg.

## 2024-05-19 NOTE — Telephone Encounter (Signed)
 Returned call to pts daughter who stated that the pt is having issues w/sleeping and not getting enough rest. Pt daughter would like to know if seroquel  could be increased. Pts daughter stated that the pts appetite has gotten increasingly larger and worse like the pt is eating to much according to the pts daughter. I stated that I would route to the provider and see what his recommendations are. It looks like LISA in phone room added to MCCUE's schedule. Should they keep that appt or make changes via phone call? The appt was added w/out consulting the NP

## 2024-05-19 NOTE — Telephone Encounter (Signed)
 I am opening my schedule next week, So I should be able to see her.

## 2024-05-19 NOTE — Telephone Encounter (Signed)
 Noted! Thank you

## 2024-05-20 ENCOUNTER — Ambulatory Visit: Admitting: Adult Health

## 2024-05-27 ENCOUNTER — Encounter: Payer: Self-pay | Admitting: Neurology

## 2024-05-27 ENCOUNTER — Ambulatory Visit: Admitting: Neurology

## 2024-05-27 VITALS — BP 136/74 | Ht 64.0 in | Wt 237.5 lb

## 2024-05-27 DIAGNOSIS — G301 Alzheimer's disease with late onset: Secondary | ICD-10-CM

## 2024-05-27 DIAGNOSIS — F02B18 Dementia in other diseases classified elsewhere, moderate, with other behavioral disturbance: Secondary | ICD-10-CM | POA: Diagnosis not present

## 2024-05-27 NOTE — Patient Instructions (Signed)
 Continue current medications Will avoid increasing antipsychotics due to potential side effect and low frequency of these events Continue with reorientation and reassurance, avoid confrontation with patient with patient Continue follow-up with your doctors Return as scheduled.

## 2024-05-27 NOTE — Progress Notes (Signed)
 [facility]  PATIENT: Jeanne [name] DOB: 03/05/1936  REQUESTING CLINICIAN: Rexanne Ingle, MD HISTORY FROM: Patient and daughter REASON FOR VISIT: Dizziness/Cognitive impairment    HISTORICAL  CHIEF COMPLAINT:  Chief Complaint  Patient presents with   Follow-up    Rm 13, sleeping issues, asking about loved ones that have passed, itching all over ,with daughter,     INTERVAL HISTORY 05/27/2024 Discussed the use of AI scribe software for clinical note transcription with the patient, who gave verbal consent to proceed.  Jeanne [name] is an 88 year old female with dementia who presents for follow up  with sleep disturbances and hallucinations  She experiences sleep disturbances characterized by waking up in the middle of the night with a sense of urgency, believing she needs to prepare for a trip or that someone is waiting for her. These episodes occur approximately once a week. She takes Quetiapine  50 mg at night, which usually helps her sleep through the night, but during these episodes, she remains awake and fixated on her thoughts. In the morning, she has no recollection of these events and sleeps well.  She often fixates on deceased family members, such as her father and brother, and asks about their whereabouts. She also inquires about her children, expressing concern for their location. These episodes are accompanied by a strong insistence on her beliefs, despite being informed otherwise.  She has had generalized itching for about a week, and her caregiver has noticed dry bumps appearing on her chest. The itching is persistent and occurs throughout the day. Her caregiver assists with her showers and applies moisturizer, but the itching persists. She has not tried any specific treatment for the itching yet.  She uses a cane for mobility and is able to walk inside the house. She has been drinking water but could improve her fluid intake. She did not sleep  well the previous night and has dry lips.        INTERVAL HISTORY 02/06/2024 Discussed the use of AI scribe software for clinical note transcription with the patient, who gave verbal consent to proceed.  Patient presents today for follow up for worsening memory, increased agitation and confusion following a recent hospitalization. She is accompanied by her daughters. She has been experiencing increased agitation and confusion, which led to her hospitalization. Prior to the hospital visit, she became uncontrollable and was found to have a urinary tract infection (UTI). She was treated for the UTI in the hospital, and upon discharge, she was prescribed Seroquel  (quetiapine ) at a dose of 12.5 mg in the morning and 50 mg in the evening. She frequently asks about 'the children' and has recall from the past, often asking about deceased family members. These episodes occur both in the morning and evening. Her daughter reports that she sometimes does not close her eyes all night and has fallen when trying to get up, necessitating assistance from fire rescue. Her daughter notes that she has lost 33 pounds and has been taken off insulin  by her diabetic doctor. Her blood sugar is monitored, and her hemoglobin A1c is 6.9. She is not currently on any diabetes medication. She has a history of mild cognitive impairment, which has progressed to dementia. She experiences hallucinations, particularly involving children. She also has a history of kidney issues and heart problems. Her current medications include Seroquel , aspirin , Lexapro , and blood pressure medication. She is not taking any diabetes medication currently. She uses a urinary wick system to manage incontinence and  prevent UTIs. Her daughter reports that she does not initiate personal care activities such as showering or changing clothes without prompting. She is also on a routine of being moved to a recliner during the day and using an exercise machine for  circulation.    INTERVAL HISTORY 04/18/2023:  Patient presents today for follow-up, last visit was in April and since then she had a recent visit to the ED on July 11 for episode of vertigo and chest pain.  At that time workup including head CT was negative for any acute abnormality.  Since then she reports she is back to her normal self, she does have occasional vertigo and lightheadedness but not to the point of fall or difficulty walking or vomiting.  She is compliant with all of her medications, but daughter reported patient is not drinking enough fluid. She does report walking in her house using her rolling walker In terms of the memory they feel like he is stable.   INTERVAL HISTORY 12/11/2022:  Patient presents today for follow-up, she is accompanied by her daughter.  Last visit was in November.  At that time we obtained a brain MRI which showed no acute abnormality.  She did started physical therapy but self discontinued.  Daughter stated that since patient husband died, she has been down. Patient recognized that sometimes, she does not want to do anything. She was recently admitted to the hospital for chest pain.  She does report that she is still experiencing dizziness on standing and with walking.  She is using assistive device.  INTERVAL HISTORY 07/19/2022 Patient presents today for follow-up, she is accompanied by her daughter.  Since last visit a month ago, she has been complaining of dizziness.  Patient reports the dizziness is constant but varies in intensity.  Dizziness is worse with head movement and also worse upon standing and walking.  She denies any falls, denies any headaches.  She describes dizziness as woozy feeling, lightheadedness.  Whenever she had these events, she has to sit and wait for the episode to pass.  She reports that meclizine  has been helping if she takes it.   INTERVAL HISTORY 06/21/22: Patient presents today for follow-up, she is accompanied by her daughter.   Last visit was in August 10.  At that time I did start her for on Aricept  for mild cognitive impairment.  She could not tolerate the medication due to vivid dreams therefore it was discontinued.  She still complains of dizziness that she describes as wooziness with position change.  There is also increased anxiety reported by patient and family.  She does not want to be alone.  Other than that there has not been any worsening of her mental status or cognition.  Overall she is stable.   HISTORY OF PRESENT ILLNESS:  This is a 88 year old woman past medical history of obesity, hypertension, hyperlipidemia, diabetes, glaucoma, CAD and CKD who is presenting for dizziness and daughter also reports cognitive impairment.  In terms of her dizziness, patient reports on July 20 while taking a shower she bent down to pick up the soap and when she got up she felt extreme dizziness that she have to sit down.  Her symptoms did not improve therefore she presented to the ED.  In the ED she had a head CT which was negative for any acute stroke and patient was discharged home with dizziness and Meclizine .  She reports dizziness at that time was positional, worse with head movement.  She has been  on meclizine  3 times daily since July 20, reports improvement of her symptoms but stated that she will feel woozy when moving her head.  Denies any fall, denies any hearing loss denies any worsening tinnitus.    Her daughter who is present on the visit has concerns about her memory.  She reports that patient is forgetful, she forgets about recent conversation, but she cannot provide good recall of her childhood memory and she also noted that she is repetitive.  Patient is not driving because she got lost while driving and since then she has decided not to drive.  Patient currently she is living with both of her daughters, she is not cooking or cleaning but states that she will walk to one room and forget the reason why she was in the  room in the first place.  Daughters are handling her bills but she is able to take care of herself, she is able to bathe herself, dress herself without any help.  She does walk with the cane.   She does complaints of changes in her vision but has an appointment with ophthalmologist in a couple months    OTHER MEDICAL CONDITIONS: Diabetes, Hypertension, Hypertension, Glaucoma, CKD, CAD    REVIEW OF SYSTEMS: Full 14 system review of systems performed and negative with exception of: as noted in the HPI   ALLERGIES: Allergies  Allergen Reactions   Amoxicillin Nausea Only    Has patient had a PCN reaction causing immediate rash, facial/tongue/throat swelling, SOB or lightheadedness with hypotension: yes, i was dizzy Has patient had a PCN reaction causing severe rash involving mucus membranes or skin necrosis: no Did a PCN reaction that required hospitalization : no, called the DR. Did PCN reaction occurring within the last 10 years: unk if all of the above answers are NO, then may proceed with Cephalosporin use.    Canagliflozin Nausea Only and Other (See Comments)    Dizziness  Dizziness  Dizziness  Other Reaction(s): dizziness   Enalapril  Maleate     Other reaction(s): angioedema  Other Reaction(s): Other (See Comments)   Liraglutide     Other reaction(s): Nausea and dizziness   Pioglitazone     Other reaction(s): macular edema   Tramadol Hcl     Other reaction(s): nausea, headache and dizziness    HOME MEDICATIONS: Outpatient Medications Prior to Visit  Medication Sig Dispense Refill   acetaminophen  (TYLENOL ) 325 MG tablet Take 650 mg by mouth every 6 (six) hours as needed for mild pain (pain score 1-3), moderate pain (pain score 4-6) or fever.     allopurinol  (ZYLOPRIM ) 100 MG tablet Take 100 mg by mouth daily.     aspirin  EC (ASPIRIN  LOW DOSE) 81 MG tablet Take 1 tablet (81 mg total) by mouth daily. 90 tablet 0   escitalopram  (LEXAPRO ) 10 MG tablet TAKE 2 TABLETS BY  MOUTH EVERY DAY 180 tablet 2   esomeprazole  (NEXIUM ) 20 MG capsule TAKE 1 CAPSULE (20 MG TOTAL) BY MOUTH DAILY AT 12 NOON. 90 capsule 0   ezetimibe  (ZETIA ) 10 MG tablet Take 10 mg by mouth daily.     ferrous sulfate  325 (65 FE) MG tablet Take 325 mg by mouth at bedtime.     hydrALAZINE  (APRESOLINE ) 25 MG tablet Take 1 tablet (25 mg total) by mouth 3 (three) times daily with meals as needed (for sbp >160).     meclizine  (ANTIVERT ) 12.5 MG tablet Take 12.5 mg by mouth 3 (three) times daily as needed for dizziness.  metoprolol  succinate (TOPROL -XL) 25 MG 24 hr tablet Take 3 tablets (75 mg total) by mouth daily. Take with or immediately following a meal. 30 tablet 0   nitroGLYCERIN  (NITROSTAT ) 0.4 MG SL tablet PLACE 1 TABLET UNDER THE TONGUE AS NEEDED FOR CHEST PAIN. 25 tablet 3   Nutritional Supplements (,FEEDING SUPPLEMENT, PROSOURCE PLUS) liquid Take 30 mLs by mouth 2 (two) times daily between meals.     oxybutynin  (DITROPAN -XL) 10 MG 24 hr tablet Take 10 mg by mouth daily.     polyethylene glycol (MIRALAX  / GLYCOLAX ) 17 g packet Take 17 g by mouth daily as needed for mild constipation. 14 each 0   Polyvinyl Alcohol-Povidone (REFRESH OP) Place 1 drop into both eyes at bedtime.     pravastatin  (PRAVACHOL ) 40 MG tablet TAKE 1 TABLET BY MOUTH EVERY DAY 90 tablet 1   QUEtiapine  (SEROQUEL ) 25 MG tablet TAKE 1/2 TABLET BY MOUTH EVERY DAY 45 tablet 1   QUEtiapine  (SEROQUEL ) 50 MG tablet TAKE 1 TABLET BY MOUTH EVERYDAY AT BEDTIME 90 tablet 1   No facility-administered medications prior to visit.    PAST MEDICAL HISTORY: Past Medical History:  Diagnosis Date   Anemia    Arthritis    Carotid arterial disease    COVID-19    Diabetes mellitus    Hyperlipidemia    Hypertension    Migraine    Obesity    Reflux    Sleep apnea     PAST SURGICAL HISTORY: Past Surgical History:  Procedure Laterality Date   CARDIAC CATHETERIZATION N/A 10/12/2015   Procedure: Left Heart Cath and Coronary  Angiography;  Surgeon: Victory LELON Sharps, MD;  Location: [facility] INVASIVE CV LAB;  Service: Cardiovascular;  Laterality: N/A;   CHOLECYSTECTOMY  50 yrs ago   JOINT REPLACEMENT Right 2005   knee   LEFT HEART CATH AND CORONARY ANGIOGRAPHY N/A 12/07/2022   Procedure: LEFT HEART CATH AND CORONARY ANGIOGRAPHY;  Surgeon: Elmira Newman PARAS, MD;  Location: [facility] INVASIVE CV LAB;  Service: Cardiovascular;  Laterality: N/A;   ORIF FEMUR FRACTURE Right 08/31/2023   Procedure: OPEN REDUCTION INTERNAL FIXATION (ORIF) DISTAL FEMUR FRACTURE;  Surgeon: Kendal Franky SQUIBB, MD;  Location: [facility] OR;  Service: Orthopedics;  Laterality: Right;   WISDOM TOOTH EXTRACTION      FAMILY HISTORY: Family History  Problem Relation Age of Onset   Heart attack Father    Heart attack Mother     SOCIAL HISTORY: Social History   Socioeconomic History   Marital status: Widowed    Spouse name: Not on file   Number of children: 2   Years of education: Not on file   Highest education level: Not on file  Occupational History   Not on file  Tobacco Use   Smoking status: Never   Smokeless tobacco: Never  Vaping Use   Vaping status: Never Used  Substance and Sexual Activity   Alcohol use: Never   Drug use: No   Sexual activity: Not on file  Other Topics Concern   Not on file  Social History Narrative   Right handed   Lives with daughters   Caffeine-occasional   Social Drivers of Health   Financial Resource Strain: Not on file  Food Insecurity: No Food Insecurity (01/01/2024)   Hunger Vital Sign    Worried About Running Out of Food in the Last Year: Never true    Ran Out of Food in the Last Year: Never true  Transportation Needs: No Transportation Needs (01/01/2024)  PRAPARE - Administrator, Civil Service (Medical): No    Lack of Transportation (Non-Medical): No  Physical Activity: Not on file  Stress: Not on file  Social Connections: Moderately Isolated (01/01/2024)   Social Connection and Isolation Panel     Frequency of Communication with Friends and Family: Never    Frequency of Social Gatherings with Friends and Family: More than three times a week    Attends Religious Services: Never    Database administrator or Organizations: Yes    Attends Banker Meetings: Never    Marital Status: Widowed  Intimate Partner Violence: Patient Unable To Answer (01/01/2024)   Humiliation, Afraid, Rape, and Kick questionnaire    Fear of Current or Ex-Partner: Patient unable to answer    Emotionally Abused: Patient unable to answer    Physically Abused: Patient unable to answer    Sexually Abused: Patient unable to answer    PHYSICAL EXAM  GENERAL EXAM/CONSTITUTIONAL: Vitals:  Vitals:   05/27/24 1017  BP: 136/74  Weight: 237 lb 8 oz (107.7 kg)  Height: 5' 4 (1.626 m)     Body mass index is 40.77 kg/m. Wt Readings from Last 3 Encounters:  05/27/24 237 lb 8 oz (107.7 kg)  01/01/24 300 lb (136.1 kg)  11/01/23 (!) 322 lb (146.1 kg)   Patient is in no distress; well developed, nourished and groomed; neck is supple.   MUSCULOSKELETAL: Gait, strength, tone, movements noted in Neurologic exam below  NEUROLOGIC: MENTAL STATUS:      No data to display            04/18/2023   10:12 AM 04/13/2022   11:21 AM  Montreal Cognitive Assessment   Visuospatial/ Executive (0/5) 3 3  Naming (0/3) 3 2  Attention: Read list of digits (0/2) 1 1  Attention: Read list of letters (0/1) 1 1  Attention: Serial 7 subtraction starting at 100 (0/3) 2 3  Language: Repeat phrase (0/2) 1 2  Language : Fluency (0/1) 1 1  Abstraction (0/2) 2 2  Delayed Recall (0/5) 0 0  Orientation (0/6) 5 6  Total 19 21  Adjusted Score (based on education) 19 21    CRANIAL NERVE:  2nd, 3rd, 4th, 6th - visual fields full to confrontation, extraocular muscles intact, no nystagmus 5th - facial sensation symmetric 7th - facial strength symmetric 8th - hearing intact 9th - palate elevates symmetrically, uvula  midline 11th - shoulder shrug symmetric 12th - tongue protrusion midline  MOTOR:  normal bulk and tone, full strength in the BUE, BLE  SENSORY:  normal and symmetric to light touch  COORDINATION:  finger-nose-finger, fine finger movements normal  GAIT/STATION:  Deferred using a wheelchair    DIAGNOSTIC DATA (LABS, IMAGING, TESTING) - I reviewed patient records, labs, notes, testing and imaging myself where available.  Lab Results  Component Value Date   WBC 6.1 01/02/2024   HGB 11.7 (L) 01/02/2024   HCT 38.2 01/02/2024   MCV 84.0 01/02/2024   PLT 244 01/02/2024      Component Value Date/Time   NA 136 01/05/2024 0650   K 3.4 (L) 01/05/2024 0650   CL 97 (L) 01/05/2024 0650   CO2 29 01/05/2024 0650   GLUCOSE 118 (H) 01/05/2024 0650   BUN 17 01/05/2024 0650   CREATININE 1.67 (H) 01/05/2024 0650   CREATININE 1.63 (H) 01/27/2020 1044   CALCIUM  9.3 01/05/2024 0650   PROT 7.2 01/02/2024 1117   ALBUMIN 2.8 (L) 01/02/2024  1117   AST 19 01/02/2024 1117   AST 12 (L) 01/27/2020 1044   ALT 8 01/02/2024 1117   ALT 9 01/27/2020 1044   ALKPHOS 71 01/02/2024 1117   BILITOT 0.7 01/02/2024 1117   BILITOT 0.3 01/27/2020 1044   GFRNONAA 30 (L) 01/05/2024 0650   GFRNONAA 29 (L) 01/27/2020 1044   GFRAA 34 (L) 01/27/2020 1044   Lab Results  Component Value Date   CHOL 116 12/07/2022   HDL 39 (L) 12/07/2022   LDLCALC 62 12/07/2022   TRIG 73 12/07/2022   CHOLHDL 3.0 12/07/2022   Lab Results  Component Value Date   HGBA1C 6.9 (H) 01/01/2024   Lab Results  Component Value Date   VITAMINB12 646 01/02/2024   Lab Results  Component Value Date   TSH 2.323 01/02/2024    Head CT 03/23/22 No acute intracranial findings are seen in noncontrast CT brain. Atrophy. Small-vessel disease   MRI Brain 08/06/2022 Multiple single and confluent T2/FLAIR hyperintense foci in the cerebral hemispheres in a pattern most consistent with moderate chronic microvascular ischemic changes.  A  focus is also noted in the pons.  There has been some progression compared to the 2017 MRI. Mild generalized cortical atrophy, progressed since the 2017 MRI and stable compared to the 2023 CT scan. Single chronic microhemorrhage in the right hemisphere is stable compared to the 2017 MRI. Mild chronic sphenoid sinusitis The internal auditory canals appeared normal on this noncontrast study.   The pituitary gland has a reduced height within an enlarged sella turcica, unchanged compared to the 2017 MRI.  A partially empty sella is nonspecific and could be incidental or due to elevated intracranial pressure.   No acute finding   Head CT 03/15/2023 1. No acute intracranial abnormality. 2. Unchanged moderate chronic small-vessel disease.   ASSESSMENT AND PLAN  88 y.o. year old female with history of obesity, hypertension, hyperlipidemia, diabetes, glaucoma, CAD and CKD and MCI who is presenting for follow up for her dementia and hallucinations.   Dementia with behavioral disturbance Intermittent nocturnal agitation, hallucinations, and delusions, consistent with dementia progression. Current management includes reorientation and environmental modifications. Medication escalation at the moment is not recommended due to potential side effects and black box warnings. - Continue current medication regimen without escalation - Implement reorientation techniques during agitation - Utilize environmental modifications such as turning on the TV to distract and calm her - Avoid correcting during delusional episodes; agree or redirect as needed - Monitor for signs of infection, such as UTI or respiratory infections, which could exacerbate symptoms  Impaired mobility requiring cane Mobility impairment necessitating cane use. Engages in physical activity using an elliptical machine. - Encourage regular use of the elliptical machine - Instruct on additional exercises to strengthen legs and arms, such as  seated leg lifts and arm extensions  Generalized pruritus with skin eruptions Recent onset of generalized pruritus with skin eruptions, described as dry bumps and hives, primarily on the chest. Differential diagnosis includes dry skin or possible allergic reaction. No clear link to current medications. - Administer Benadryl  for itching, preferably non-drowsy formulation during the day and regular formulation at night if needed - Monitor skin condition and pruritus for any changes or worsening    1. Moderate late onset Alzheimer's dementia with other behavioral disturbance [facility])      Patient Instructions  Continue current medications Will avoid increasing antipsychotics due to potential side effect and low frequency of these events Continue with reorientation and reassurance, avoid confrontation with  patient with patient Continue follow-up with your doctors Return as scheduled.  No orders of the defined types were placed in this encounter.   No orders of the defined types were placed in this encounter.   No follow-ups on file.  I have spent a total of 30 minutes dedicated to this patient today, preparing to see patient, performing a medically appropriate examination and evaluation, ordering tests and/or medications and procedures, and counseling and educating the patient/family/caregiver; independently interpreting result and communicating results to the family/patient/caregiver; and documenting clinical information in the electronic medical record.   Pastor Falling, MD 05/27/2024, 1:46 PM  [facility] [address], [address] [place], [place] [address] [phone]

## 2024-05-31 ENCOUNTER — Other Ambulatory Visit: Payer: Self-pay | Admitting: Cardiology

## 2024-06-04 ENCOUNTER — Other Ambulatory Visit: Payer: Self-pay | Admitting: Cardiology

## 2024-06-04 DIAGNOSIS — I119 Hypertensive heart disease without heart failure: Secondary | ICD-10-CM

## 2024-06-05 MED ORDER — HYDRALAZINE HCL 25 MG PO TABS: 25.0000 mg | ORAL_TABLET | Freq: Three times a day (TID) | ORAL | 0 refills | Status: DC | PRN

## 2024-06-17 ENCOUNTER — Other Ambulatory Visit: Payer: Self-pay | Admitting: Cardiology

## 2024-06-20 ENCOUNTER — Other Ambulatory Visit: Payer: Self-pay | Admitting: Cardiology

## 2024-06-20 ENCOUNTER — Other Ambulatory Visit: Payer: Self-pay | Admitting: Neurology

## 2024-06-20 DIAGNOSIS — R0789 Other chest pain: Secondary | ICD-10-CM

## 2024-06-20 DIAGNOSIS — I119 Hypertensive heart disease without heart failure: Secondary | ICD-10-CM

## 2024-06-21 ENCOUNTER — Other Ambulatory Visit: Payer: Self-pay | Admitting: Cardiology

## 2024-06-21 DIAGNOSIS — R0789 Other chest pain: Secondary | ICD-10-CM

## 2024-06-25 NOTE — Telephone Encounter (Signed)
 Pt of Dr. Elmira. Is this RX something that Dr. Elmira wants to refill? Please advise

## 2024-06-30 NOTE — Telephone Encounter (Signed)
 1 month refill okay. I will talk to her during office visit in a few weeks re future refills through PCP.  Thanks MJP

## 2024-07-24 ENCOUNTER — Encounter: Payer: Self-pay | Admitting: Cardiology

## 2024-07-24 ENCOUNTER — Ambulatory Visit: Attending: Cardiology | Admitting: Cardiology

## 2024-07-24 VITALS — BP 116/71 | HR 78 | Ht 65.0 in | Wt 247.0 lb

## 2024-07-24 DIAGNOSIS — I25118 Atherosclerotic heart disease of native coronary artery with other forms of angina pectoris: Secondary | ICD-10-CM

## 2024-07-24 DIAGNOSIS — I10 Essential (primary) hypertension: Secondary | ICD-10-CM

## 2024-07-24 NOTE — Progress Notes (Signed)
  Cardiology Office Note:  .   Date:  07/24/2024  ID:  Jeanne [name], DOB 07-Aug-1936, MRN [id number] PCP: Rexanne Ingle, MD  [facility] Providers Cardiologist:  Newman Lawrence, MD PCP: Rexanne Ingle, MD  Chief Complaint  Patient presents with   Coronary Artery Disease      History of Present Illness: .    Jeanne [name] is a 88 y.o. female with hypertension, hyperlipidemia, type 2 DM, nonobstructive CAD, OSA, IgG kappa monoclonal gammopathy, CKD IV  Patient is here today with her daughter.  She is doing fairly well without any chest pain or shortness of breath.  Leg swelling is controlled with compression stockings.  She is compliant with her medical therapy.  Blood pressure is well-controlled.  Vitals:   07/24/24 0954  BP: 116/71  Pulse: 78  SpO2: 96%     ROS:  Review of Systems  Cardiovascular:  Negative for chest pain, dyspnea on exertion, leg swelling, palpitations and syncope.  Musculoskeletal:  Positive for arthritis and back pain.     Studies Reviewed: SABRA       EKG 01/06/2023: Sinus rhythm 60 bpm Left axis deviation Possible old anteroseptal infarct  Labs 01/2024: HbA1C 6.9% Hb 11.7 Cr 1.67, eGFR 30, K 3.4 TSH 2.3  12/2022: Chol 116, TG 73, HDL 39, LDL 62   Physical Exam:   Physical Exam Vitals and nursing note reviewed.  Constitutional:      General: She is not in acute distress.    Appearance: She is obese.  Neck:     Vascular: No JVD.  Cardiovascular:     Rate and Rhythm: Normal rate and regular rhythm.     Heart sounds: Normal heart sounds. No murmur heard. Pulmonary:     Effort: Pulmonary effort is normal.     Breath sounds: Normal breath sounds. No wheezing or rales.  Musculoskeletal:     Right lower leg: No edema.     Left lower leg: No edema.      VISIT DIAGNOSES:   ICD-10-CM   1. Coronary artery disease of native artery of native heart with stable angina pectoris  I25.118     2. Essential hypertension  I10          ASSESSMENT AND PLAN: .    BRADY PLANT is a 88 y.o. female with hypertension, hyperlipidemia, type 2 DM, nonobstructive CAD, OSA, IgG kappa monoclonal gammopathy, CKD IV   CAD: Mild to moderate, nonobstructive (cath 12/2022). Continue aspirin , statin, amlodipine  5 mg daily, metoprolol  succinate 25 mg daily. Lipids well-controlled, on pravastatin  and Zetia .   Hypertension: Controlled       F/u as needed  Signed, Newman JINNY Lawrence, MD

## 2024-07-24 NOTE — Patient Instructions (Signed)
 Follow-Up: At [facility], you and your health needs are our priority.  As part of our continuing mission to provide you with exceptional heart care, our providers are all part of one team.  This team includes your primary Cardiologist (physician) and Advanced Practice Providers or APPs (Physician Assistants and Nurse Practitioners) who all work together to provide you with the care you need, when you need it.  Your next appointment:   As needed   Provider:   Cody Das, MD    We recommend signing up for the patient portal called "MyChart".  Sign up information is provided on this After Visit Summary.  MyChart is used to connect with patients for Virtual Visits (Telemedicine).  Patients are able to view lab/test results, encounter notes, upcoming appointments, etc.  Non-urgent messages can be sent to your provider as well.   To learn more about what you can do with MyChart, go to [email or web address].

## 2024-08-22 ENCOUNTER — Ambulatory Visit: Admission: EM | Admit: 2024-08-22 | Discharge: 2024-08-22 | Disposition: A | Discharge status: Home / Self Care

## 2024-08-22 ENCOUNTER — Encounter: Payer: Self-pay | Admitting: *Deleted

## 2024-08-22 DIAGNOSIS — T169XXA Foreign body in ear, unspecified ear, initial encounter: Secondary | ICD-10-CM

## 2024-08-22 NOTE — ED Provider Notes (Signed)
 " [facility]    CSN: [id number] Arrival date & time: 08/22/24  1752      History   Chief Complaint Chief Complaint  Patient presents with   Foreign Body in Ear    HPI Jeanne [name] is a 88 y.o. female.  Patient with past history significant for dementia presents to the urgent care today with concerns of a foreign body in the ear.  Reports picking her ear with a toothpick on the left side and believes that she maybe had broken off a piece.  Patient's daughter is at bedside and states that she will routinely do this and has had foreign object in the ear before.  Denies any significant pain, hearing loss, or drainage coming from the ear.   Foreign Body in Ear    Past Medical History:  Diagnosis Date   Anemia    Arthritis    Carotid arterial disease    COVID-19    Diabetes mellitus    Hyperlipidemia    Hypertension    Migraine    Obesity    Reflux    Sleep apnea     Patient Active Problem List   Diagnosis Date Noted   Acute encephalopathy 01/01/2024   Uncontrolled type 2 diabetes mellitus with hypoglycemia, with long-term current use of insulin  (HCC) 01/01/2024   Hip fracture (HCC) 08/31/2023   Closed nondisplaced segmental fracture of shaft of right femur (HCC) 08/31/2023   Chest pain 12/06/2022   Mixed hyperlipidemia 10/27/2022   Pseudophakia, both eyes 06/05/2022   Dizziness 05/17/2022   Excessive cerumen in both ear canals 05/17/2022   GERD (gastroesophageal reflux disease) 05/02/2021   Chronic diastolic CHF (congestive heart failure) (HCC) 10/07/2020   Moderate nonproliferative diabetic retinopathy of both eyes (HCC) 05/25/2020   Chorioretinal scar 05/25/2020   Left epiretinal membrane 05/25/2020   Posterior vitreous detachment of both eyes 05/25/2020   Acute respiratory failure with hypoxia (HCC) 10/11/2019   CAP (community acquired pneumonia) 10/11/2019   Diastolic dysfunction, left ventricle 04/05/2019   Coronary artery disease of native  artery of native heart with stable angina pectoris 11/23/2018   Hyperlipidemia 11/23/2018   Unstable angina (HCC) 10/12/2015   Abnormal myocardial perfusion study 10/12/2015   NSVT (nonsustained ventricular tachycardia) (HCC) 10/11/2015   Essential hypertension 10/11/2015   CKD (chronic kidney disease), stage III (HCC) 10/11/2015   Atypical chest pain    Angina pectoris 10/08/2015   DM2 (diabetes mellitus, type 2) (HCC) 10/08/2015   AKI (acute kidney injury)    Abscess of left arm 01/01/2013   Migraine    Morbid obesity (HCC)    Obstructive sleep apnea    Stenosis of left carotid artery    Reflux    Anemia of chronic disease     Past Surgical History:  Procedure Laterality Date   CARDIAC CATHETERIZATION N/A 10/12/2015   Procedure: Left Heart Cath and Coronary Angiography;  Surgeon: Victory LELON Sharps, MD;  Location: [facility] INVASIVE CV LAB;  Service: Cardiovascular;  Laterality: N/A;   CHOLECYSTECTOMY  50 yrs ago   JOINT REPLACEMENT Right 2005   knee   LEFT HEART CATH AND CORONARY ANGIOGRAPHY N/A 12/07/2022   Procedure: LEFT HEART CATH AND CORONARY ANGIOGRAPHY;  Surgeon: Elmira Newman PARAS, MD;  Location: [facility] INVASIVE CV LAB;  Service: Cardiovascular;  Laterality: N/A;   ORIF FEMUR FRACTURE Right 08/31/2023   Procedure: OPEN REDUCTION INTERNAL FIXATION (ORIF) DISTAL FEMUR FRACTURE;  Surgeon: Kendal Franky SQUIBB, MD;  Location: [facility] OR;  Service: Orthopedics;  Laterality: Right;   WISDOM TOOTH EXTRACTION      OB History   No obstetric history on file.      Home Medications    Prior to Admission medications  Medication Sig Start Date End Date Taking? Authorizing Provider  acetaminophen  (TYLENOL ) 325 MG tablet Take 650 mg by mouth every 6 (six) hours as needed for mild pain (pain score 1-3), moderate pain (pain score 4-6) or fever.   Yes [provider]  allopurinol  (ZYLOPRIM ) 100 MG tablet Take 100 mg by mouth daily. 05/27/20  Yes [provider]  amLODipine  (NORVASC ) 5 MG  tablet Take 5 mg by mouth daily. 06/20/24  Yes [provider]  aspirin  EC (ASPIRIN  LOW DOSE) 81 MG tablet Take 1 tablet (81 mg total) by mouth daily. 02/13/23  Yes Patwardhan, Manish J, MD  docusate sodium  (COLACE) 100 MG capsule 1 capsule as needed Orally Once a day   Yes [provider]  escitalopram  (LEXAPRO ) 10 MG tablet TAKE 2 TABLETS BY MOUTH EVERY DAY 06/23/24  Yes Camara, Amadou, MD  esomeprazole  (NEXIUM ) 20 MG capsule TAKE 1 CAPSULE (20 MG TOTAL) BY MOUTH DAILY AT 12 NOON. 06/30/24  Yes Patwardhan, Manish J, MD  ezetimibe  (ZETIA ) 10 MG tablet Take 10 mg by mouth daily.   Yes [provider]  ferrous sulfate  325 (65 FE) MG tablet Take 325 mg by mouth at bedtime. 07/30/19  Yes [provider]  furosemide  (LASIX ) 20 MG tablet Take 20 mg by mouth daily. 06/25/24  Yes [provider]  hydrALAZINE  (APRESOLINE ) 25 MG tablet TAKE 1 TABLET (25 MG TOTAL) BY MOUTH 3 (THREE) TIMES DAILY WITH MEALS AS NEEDED (FOR SBP >160). 06/20/24  Yes Patwardhan, Newman PARAS, MD  metoprolol  succinate (TOPROL -XL) 25 MG 24 hr tablet Take 25 mg by mouth daily. 01/06/24  Yes [provider]  oxybutynin  (DITROPAN -XL) 10 MG 24 hr tablet Take 10 mg by mouth daily.   Yes [provider]  polyethylene glycol (MIRALAX  / GLYCOLAX ) 17 g packet Take 17 g by mouth daily as needed for mild constipation. 09/04/23  Yes Rosario Leatrice FERNS, MD  Polyvinyl Alcohol-Povidone (REFRESH OP) Place 1 drop into both eyes at bedtime.   Yes [provider]  pravastatin  (PRAVACHOL ) 40 MG tablet TAKE 1 TABLET BY MOUTH EVERY DAY 01/30/24  Yes Patwardhan, Manish J, MD  QUEtiapine  (SEROQUEL ) 50 MG tablet TAKE 1 TABLET BY MOUTH EVERYDAY AT BEDTIME 06/23/24  Yes Camara, Amadou, MD  ciprofloxacin  (CIPRO ) 250 MG tablet Take 250 mg by mouth daily. Patient not taking: Reported on 08/22/2024    [provider]  doxycycline  (VIBRA -TABS) 100 MG tablet Take 100 mg by mouth 2 (two) times  daily. Patient not taking: Reported on 08/22/2024 07/15/24   [provider]  doxycycline  (VIBRAMYCIN ) 100 MG capsule Take 100 mg by mouth 2 (two) times daily. Patient not taking: Reported on 08/22/2024 08/04/24   [provider]  meclizine  (ANTIVERT ) 12.5 MG tablet Take 12.5 mg by mouth 3 (three) times daily as needed for dizziness.    [provider]  nitroGLYCERIN  (NITROSTAT ) 0.4 MG SL tablet PLACE 1 TABLET UNDER THE TONGUE AS NEEDED FOR CHEST PAIN. 08/21/22   Patwardhan, Newman PARAS, MD  Nutritional Supplements (,FEEDING SUPPLEMENT, PROSOURCE PLUS) liquid Take 30 mLs by mouth 2 (two) times daily between meals. 09/04/23   Rosario Leatrice FERNS, MD  QUEtiapine  (SEROQUEL ) 25 MG tablet TAKE 1/2 TABLET BY MOUTH DAILY 06/23/24   Camara, Amadou, MD  Family History Family History  Problem Relation Age of Onset   Heart attack Father    Heart attack Mother     Social History Social History[1]   Allergies   Amoxicillin, Canagliflozin, Enalapril  maleate, Liraglutide, Pioglitazone, and Tramadol hcl   Review of Systems Review of Systems  HENT:         Foreign body in the ear  All other systems reviewed and are negative.    Physical Exam Triage Vital Signs ED Triage Vitals  Encounter Vitals Group     BP 08/22/24 1833 127/76     Girls Systolic BP Percentile --      Girls Diastolic BP Percentile --      Boys Systolic BP Percentile --      Boys Diastolic BP Percentile --      Pulse Rate 08/22/24 1833 73     Resp 08/22/24 1833 18     Temp 08/22/24 1833 98.7 F (37.1 C)     Temp Source 08/22/24 1833 Oral     SpO2 08/22/24 1833 95 %     Weight --      Height --      Head Circumference --      Peak Flow --      Pain Score 08/22/24 1829 0     Pain Loc --      Pain Education --      Exclude from Growth Chart --    No data found.  Updated Vital Signs BP 127/76 (BP Location: Left Arm)   Pulse 73   Temp 98.7 F (37.1 C) (Oral)   Resp 18   SpO2 95%    Visual Acuity Right Eye Distance:   Left Eye Distance:   Bilateral Distance:    Right Eye Near:   Left Eye Near:    Bilateral Near:     Physical Exam Vitals and nursing note reviewed.  Constitutional:      General: She is not in acute distress.    Appearance: She is well-developed.  HENT:     Head: Normocephalic and atraumatic.     Right Ear: Tympanic membrane, ear canal and external ear normal.     Left Ear: Tympanic membrane normal.     Ears:     Comments: Foreign object in the left ear canal appears pink/maroon Eyes:     Conjunctiva/sclera: Conjunctivae normal.  Cardiovascular:     Rate and Rhythm: Normal rate and regular rhythm.     Heart sounds: No murmur heard. Pulmonary:     Effort: Pulmonary effort is normal. No respiratory distress.     Breath sounds: Normal breath sounds.  Abdominal:     Palpations: Abdomen is soft.     Tenderness: There is no abdominal tenderness.  Musculoskeletal:        General: No swelling.     Cervical back: Neck supple.  Skin:    General: Skin is warm and dry.     Capillary Refill: Capillary refill takes less than 2 seconds.  Neurological:     Mental Status: She is alert.  Psychiatric:        Mood and Affect: Mood normal.      UC Treatments / Results  Labs (all labs ordered are listed, but only abnormal results are displayed) Labs Reviewed - No data to display  EKG   Radiology No results found.  Procedures Foreign Body Removal  Date/Time: 08/22/2024 7:03 PM  Performed by: Teala Daffron A, PA-C Authorized by: Basia Mcginty  A, PA-C   Consent:    Consent obtained:  Verbal   Consent given by:  Patient and guardian   Risks discussed:  Pain, incomplete removal and worsening of condition   Alternatives discussed:  No treatment and referral Universal protocol:    Patient identity confirmed:  Arm band and provided demographic data Location:    Location:  Ear   Ear location:  L ear   Tendon involvement:   None Pre-procedure details:    Imaging:  None   Neurovascular status: intact   Anesthesia:    Anesthesia method:  None Procedure type:    Procedure complexity:  Simple Procedure details:    Foreign bodies recovered:  1   Description:  Maroon-colored gel nail   Intact foreign body removal: yes   Post-procedure details:    Neurovascular status: intact     Confirmation:  No additional foreign bodies on visualization   Skin closure:  None   Dressing:  Open (no dressing)   Procedure completion:  Tolerated  (including critical care time)  Medications Ordered in UC Medications - No data to display  Initial Impression / Assessment and Plan / UC Course  I have reviewed the triage vital signs and the nursing notes.  Pertinent labs & imaging results that were available during my care of the patient were reviewed by me and considered in my medical decision making (see chart for details).     Patient presents to urgent care today with concerns of foreign body in left ear.  Patient is accompanied at bedside with her daughter who reports that she has done this in the past that she uses a toothpick at times to scratch her ears.  She denies any significant pain in this area but she cannot recall exactly which year she believes that she may have a foreign body in.  Reports this sensation started several hours ago.  ENT examination reveals what appears to be pink or maroon-colored foreign object in the left ear canal.  No obvious signs of discharge or drainage.  The right side is unremarkable.  Verbal consent obtained to remove what appears to be a foreign body in the left ear.  Patient's daughter consents to management.  Was able to retrieve what appears to be a gel or acrylic type nail that is maroon in color.  Unclear how long this has been present.  Reexamination of the left ear canal reveals no signs of trauma, bleeding, or injury to the tympanic membrane.  Given reassuring exam post removal  of foreign object, no acute indication to start any sort of treatment beyond symptom control for any discomfort that removal may have caused.  She is otherwise stable for outpatient follow-up and discharged home. Final Clinical Impressions(s) / UC Diagnoses   Final diagnoses:  Non-penetrating foreign body in ear canal, initial encounter     Discharge Instructions      You were seen in the urgent care today for concerns of a foreign body in the ear canal.  A gel nail that was maroon/brown color was removed from this area.  There is no obvious signs of injury to the left ear canal or the tympanic membrane.  Please refrain from putting anything else in the ear as this can cause injury in the future.  Follow-up closely with your primary care prior for for evaluation.     ED Prescriptions   None    PDMP not reviewed this encounter.    [1]  Social History Tobacco  Use   Smoking status: Never   Smokeless tobacco: Never  Vaping Use   Vaping status: Never Used  Substance Use Topics   Alcohol use: Never   Drug use: No     Kashae Carstens A, PA-C 08/22/24 1906  "

## 2024-08-22 NOTE — ED Triage Notes (Signed)
 Pt reports she was itching her ear with a toothpick and thinks the tip of it may be in her ear. Pt's daughter is with her. She states pt has dementia and can't recall which ear is involved, initially pt pointed to her right ear.

## 2024-08-22 NOTE — Discharge Instructions (Signed)
 You were seen in the urgent care today for concerns of a foreign body in the ear canal.  A gel nail that was maroon/brown color was removed from this area.  There is no obvious signs of injury to the left ear canal or the tympanic membrane.  Please refrain from putting anything else in the ear as this can cause injury in the future.  Follow-up closely with your primary care prior for for evaluation.

## 2024-09-10 ENCOUNTER — Ambulatory Visit: Admitting: Neurology

## 2024-09-10 ENCOUNTER — Encounter: Payer: Self-pay | Admitting: Neurology

## 2024-09-10 VITALS — BP 132/82 | HR 84 | Resp 15 | Ht 66.0 in

## 2024-09-10 DIAGNOSIS — G301 Alzheimer's disease with late onset: Secondary | ICD-10-CM | POA: Diagnosis not present

## 2024-09-10 DIAGNOSIS — F02B18 Dementia in other diseases classified elsewhere, moderate, with other behavioral disturbance: Secondary | ICD-10-CM | POA: Diagnosis not present

## 2024-09-10 NOTE — Patient Instructions (Signed)
 Continue current medications  She was not able to tolerate Aricept  and Namenda is contraindicated due to allergy to Enalapril  (angioedema)  Continue with daily re-orientation  Return in 6 months or sooner if worse

## 2024-09-10 NOTE — Progress Notes (Signed)
 "   [facility]  PATIENT: Jeanne [name] DOB: Oct 28, 1935  REQUESTING CLINICIAN: Rexanne Ingle, MD HISTORY FROM: Patient and daughter REASON FOR VISIT: Dizziness/Cognitive impairment    HISTORICAL  CHIEF COMPLAINT:  Chief Complaint  Patient presents with   Memory Loss    Rm11, daughter present, Memory loss follow up: moca score of 13    INTERVAL HISTORY 09/10/2024 Discussed the use of AI scribe software for clinical note transcription with the patient, who gave verbal consent to proceed.  Jeanne [name] is an 89 year old female with mild dementia who presents for follow up. She is accompanied by her daughter.   She has been experiencing worsening memory loss over the past few months, characterized by increased forgetfulness, repeated questioning, and recalling past family members, particularly missing her husband. No visual hallucinations are present, but she recalls names and events from the past since starting the Seroquel . Her daughter notes an increase in the frequency of repeated questions.  Her sleep is generally good once she falls asleep, although she experiences a variety of dreams, none of which are particularly frightening. She takes Seroquel , 12.5 mg in the morning and 50 mg at night.  She has a history of urinary tract infections that previously caused confusion and hallucinations. Currently, she is experiencing kidney issues and is scheduled to see a nephrologist. She continues to produce urine and have bowel movements, although they are less frequent and longer in form. She takes Citrucel daily to aid bowel movements.  Her appetite has increased recently, which is a change from previously eating very little. Her weight has fluctuated, with an increase from 237 lbs in September to 247 lbs in November. She was previously on insulin  for diabetes but has been taken off due to stable blood sugar levels.   INTERVAL HISTORY 05/27/2024 Discussed the use  of AI scribe software for clinical note transcription with the patient, who gave verbal consent to proceed.  Jeanne [name] is an 89 year old female with dementia who presents for follow up  with sleep disturbances and hallucinations  She experiences sleep disturbances characterized by waking up in the middle of the night with a sense of urgency, believing she needs to prepare for a trip or that someone is waiting for her. These episodes occur approximately once a week. She takes Quetiapine  50 mg at night, which usually helps her sleep through the night, but during these episodes, she remains awake and fixated on her thoughts. In the morning, she has no recollection of these events and sleeps well.  She often fixates on deceased family members, such as her father and brother, and asks about their whereabouts. She also inquires about her children, expressing concern for their location. These episodes are accompanied by a strong insistence on her beliefs, despite being informed otherwise.  She has had generalized itching for about a week, and her caregiver has noticed dry bumps appearing on her chest. The itching is persistent and occurs throughout the day. Her caregiver assists with her showers and applies moisturizer, but the itching persists. She has not tried any specific treatment for the itching yet.  She uses a cane for mobility and is able to walk inside the house. She has been drinking water but could improve her fluid intake. She did not sleep well the previous night and has dry lips.    INTERVAL HISTORY 02/06/2024 Discussed the use of AI scribe software for clinical note transcription with the patient, who gave verbal  consent to proceed.  Patient presents today for follow up for worsening memory, increased agitation and confusion following a recent hospitalization. She is accompanied by her daughters. She has been experiencing increased agitation and confusion, which led to her  hospitalization. Prior to the hospital visit, she became uncontrollable and was found to have a urinary tract infection (UTI). She was treated for the UTI in the hospital, and upon discharge, she was prescribed Seroquel  (quetiapine ) at a dose of 12.5 mg in the morning and 50 mg in the evening. She frequently asks about 'the children' and has recall from the past, often asking about deceased family members. These episodes occur both in the morning and evening. Her daughter reports that she sometimes does not close her eyes all night and has fallen when trying to get up, necessitating assistance from fire rescue. Her daughter notes that she has lost 33 pounds and has been taken off insulin  by her diabetic doctor. Her blood sugar is monitored, and her hemoglobin A1c is 6.9. She is not currently on any diabetes medication. She has a history of mild cognitive impairment, which has progressed to dementia. She experiences hallucinations, particularly involving children. She also has a history of kidney issues and heart problems. Her current medications include Seroquel , aspirin , Lexapro , and blood pressure medication. She is not taking any diabetes medication currently. She uses a urinary wick system to manage incontinence and prevent UTIs. Her daughter reports that she does not initiate personal care activities such as showering or changing clothes without prompting. She is also on a routine of being moved to a recliner during the day and using an exercise machine for circulation.    INTERVAL HISTORY 04/18/2023:  Patient presents today for follow-up, last visit was in April and since then she had a recent visit to the ED on July 11 for episode of vertigo and chest pain.  At that time workup including head CT was negative for any acute abnormality.  Since then she reports she is back to her normal self, she does have occasional vertigo and lightheadedness but not to the point of fall or difficulty walking or  vomiting.  She is compliant with all of her medications, but daughter reported patient is not drinking enough fluid. She does report walking in her house using her rolling walker In terms of the memory they feel like he is stable.   INTERVAL HISTORY 12/11/2022:  Patient presents today for follow-up, she is accompanied by her daughter.  Last visit was in November.  At that time we obtained a brain MRI which showed no acute abnormality.  She did started physical therapy but self discontinued.  Daughter stated that since patient husband died, she has been down. Patient recognized that sometimes, she does not want to do anything. She was recently admitted to the hospital for chest pain.  She does report that she is still experiencing dizziness on standing and with walking.  She is using assistive device.  INTERVAL HISTORY 07/19/2022 Patient presents today for follow-up, she is accompanied by her daughter.  Since last visit a month ago, she has been complaining of dizziness.  Patient reports the dizziness is constant but varies in intensity.  Dizziness is worse with head movement and also worse upon standing and walking.  She denies any falls, denies any headaches.  She describes dizziness as woozy feeling, lightheadedness.  Whenever she had these events, she has to sit and wait for the episode to pass.  She reports that meclizine  has  been helping if she takes it.   INTERVAL HISTORY 06/21/22: Patient presents today for follow-up, she is accompanied by her daughter.  Last visit was in August 10.  At that time I did start her for on Aricept  for mild cognitive impairment.  She could not tolerate the medication due to vivid dreams therefore it was discontinued.  She still complains of dizziness that she describes as wooziness with position change.  There is also increased anxiety reported by patient and family.  She does not want to be alone.  Other than that there has not been any worsening of her mental status or  cognition.  Overall she is stable.   HISTORY OF PRESENT ILLNESS:  This is a 89 year old woman past medical history of obesity, hypertension, hyperlipidemia, diabetes, glaucoma, CAD and CKD who is presenting for dizziness and daughter also reports cognitive impairment.  In terms of her dizziness, patient reports on July 20 while taking a shower she bent down to pick up the soap and when she got up she felt extreme dizziness that she have to sit down.  Her symptoms did not improve therefore she presented to the ED.  In the ED she had a head CT which was negative for any acute stroke and patient was discharged home with dizziness and Meclizine .  She reports dizziness at that time was positional, worse with head movement.  She has been on meclizine  3 times daily since July 20, reports improvement of her symptoms but stated that she will feel woozy when moving her head.  Denies any fall, denies any hearing loss denies any worsening tinnitus.    Her daughter who is present on the visit has concerns about her memory.  She reports that patient is forgetful, she forgets about recent conversation, but she cannot provide good recall of her childhood memory and she also noted that she is repetitive.  Patient is not driving because she got lost while driving and since then she has decided not to drive.  Patient currently she is living with both of her daughters, she is not cooking or cleaning but states that she will walk to one room and forget the reason why she was in the room in the first place.  Daughters are handling her bills but she is able to take care of herself, she is able to bathe herself, dress herself without any help.  She does walk with the cane.   She does complaints of changes in her vision but has an appointment with ophthalmologist in a couple months    OTHER MEDICAL CONDITIONS: Diabetes, Hypertension, Hypertension, Glaucoma, CKD, CAD    REVIEW OF SYSTEMS: Full 14 system review of systems  performed and negative with exception of: as noted in the HPI   ALLERGIES: Allergies  Allergen Reactions   Amoxicillin Nausea Only    Has patient had a PCN reaction causing immediate rash, facial/tongue/throat swelling, SOB or lightheadedness with hypotension: yes, i was dizzy Has patient had a PCN reaction causing severe rash involving mucus membranes or skin necrosis: no Did a PCN reaction that required hospitalization : no, called the DR. Did PCN reaction occurring within the last 10 years: unk if all of the above answers are NO, then may proceed with Cephalosporin use.    Canagliflozin Nausea Only and Other (See Comments)    Dizziness  Dizziness  Dizziness  Other Reaction(s): dizziness   Enalapril  Maleate     Other reaction(s): angioedema  Other Reaction(s): Other (See Comments)  Liraglutide     Other reaction(s): Nausea and dizziness   Pioglitazone     Other reaction(s): macular edema   Tramadol Hcl     Other reaction(s): nausea, headache and dizziness    HOME MEDICATIONS: Outpatient Medications Prior to Visit  Medication Sig Dispense Refill   acetaminophen  (TYLENOL ) 325 MG tablet Take 650 mg by mouth every 6 (six) hours as needed for mild pain (pain score 1-3), moderate pain (pain score 4-6) or fever.     allopurinol  (ZYLOPRIM ) 100 MG tablet Take 100 mg by mouth daily.     amLODipine  (NORVASC ) 5 MG tablet Take 5 mg by mouth daily.     aspirin  EC (ASPIRIN  LOW DOSE) 81 MG tablet Take 1 tablet (81 mg total) by mouth daily. 90 tablet 0   ciprofloxacin  (CIPRO ) 250 MG tablet Take 250 mg by mouth daily.     docusate sodium  (COLACE) 100 MG capsule 1 capsule as needed Orally Once a day     doxycycline  (VIBRA -TABS) 100 MG tablet Take 100 mg by mouth 2 (two) times daily.     doxycycline  (VIBRAMYCIN ) 100 MG capsule Take 100 mg by mouth 2 (two) times daily.     escitalopram  (LEXAPRO ) 10 MG tablet TAKE 2 TABLETS BY MOUTH EVERY DAY 180 tablet 2   esomeprazole  (NEXIUM ) 20 MG  capsule TAKE 1 CAPSULE (20 MG TOTAL) BY MOUTH DAILY AT 12 NOON. 30 capsule 0   ezetimibe  (ZETIA ) 10 MG tablet Take 10 mg by mouth daily.     ferrous sulfate  325 (65 FE) MG tablet Take 325 mg by mouth at bedtime.     furosemide  (LASIX ) 20 MG tablet Take 20 mg by mouth daily.     hydrALAZINE  (APRESOLINE ) 25 MG tablet TAKE 1 TABLET (25 MG TOTAL) BY MOUTH 3 (THREE) TIMES DAILY WITH MEALS AS NEEDED (FOR SBP >160). 270 tablet 0   meclizine  (ANTIVERT ) 12.5 MG tablet Take 12.5 mg by mouth 3 (three) times daily as needed for dizziness.     metoprolol  succinate (TOPROL -XL) 25 MG 24 hr tablet Take 25 mg by mouth daily.     nitroGLYCERIN  (NITROSTAT ) 0.4 MG SL tablet PLACE 1 TABLET UNDER THE TONGUE AS NEEDED FOR CHEST PAIN. 25 tablet 3   Nutritional Supplements (,FEEDING SUPPLEMENT, PROSOURCE PLUS) liquid Take 30 mLs by mouth 2 (two) times daily between meals.     oxybutynin  (DITROPAN -XL) 10 MG 24 hr tablet Take 10 mg by mouth daily.     polyethylene glycol (MIRALAX  / GLYCOLAX ) 17 g packet Take 17 g by mouth daily as needed for mild constipation. 14 each 0   Polyvinyl Alcohol-Povidone (REFRESH OP) Place 1 drop into both eyes at bedtime.     pravastatin  (PRAVACHOL ) 40 MG tablet TAKE 1 TABLET BY MOUTH EVERY DAY 90 tablet 1   QUEtiapine  (SEROQUEL ) 25 MG tablet TAKE 1/2 TABLET BY MOUTH DAILY 45 tablet 1   QUEtiapine  (SEROQUEL ) 50 MG tablet TAKE 1 TABLET BY MOUTH EVERYDAY AT BEDTIME 90 tablet 1   No facility-administered medications prior to visit.    PAST MEDICAL HISTORY: Past Medical History:  Diagnosis Date   Anemia    Arthritis    Carotid arterial disease    COVID-19    Diabetes mellitus    Hyperlipidemia    Hypertension    Migraine    Obesity    Reflux    Sleep apnea     PAST SURGICAL HISTORY: Past Surgical History:  Procedure Laterality Date   CARDIAC CATHETERIZATION N/A 10/12/2015  Procedure: Left Heart Cath and Coronary Angiography;  Surgeon: Victory LELON Sharps, MD;  Location: [facility] INVASIVE CV  LAB;  Service: Cardiovascular;  Laterality: N/A;   CHOLECYSTECTOMY  50 yrs ago   JOINT REPLACEMENT Right 2005   knee   LEFT HEART CATH AND CORONARY ANGIOGRAPHY N/A 12/07/2022   Procedure: LEFT HEART CATH AND CORONARY ANGIOGRAPHY;  Surgeon: Elmira Newman PARAS, MD;  Location: [facility] INVASIVE CV LAB;  Service: Cardiovascular;  Laterality: N/A;   ORIF FEMUR FRACTURE Right 08/31/2023   Procedure: OPEN REDUCTION INTERNAL FIXATION (ORIF) DISTAL FEMUR FRACTURE;  Surgeon: Kendal Franky SQUIBB, MD;  Location: [facility] OR;  Service: Orthopedics;  Laterality: Right;   WISDOM TOOTH EXTRACTION      FAMILY HISTORY: Family History  Problem Relation Age of Onset   Heart attack Father    Heart attack Mother     SOCIAL HISTORY: Social History   Socioeconomic History   Marital status: Widowed    Spouse name: Not on file   Number of children: 2   Years of education: Not on file   Highest education level: Not on file  Occupational History   Not on file  Tobacco Use   Smoking status: Never   Smokeless tobacco: Never  Vaping Use   Vaping status: Never Used  Substance and Sexual Activity   Alcohol use: Never   Drug use: No   Sexual activity: Not on file  Other Topics Concern   Not on file  Social History Narrative   Right handed   Lives with daughters   Caffeine-occasional   Social Drivers of Health   Tobacco Use: Low Risk (09/10/2024)   Patient History    Smoking Tobacco Use: Never    Smokeless Tobacco Use: Never    Passive Exposure: Not on file  Financial Resource Strain: Not on file  Food Insecurity: No Food Insecurity (01/01/2024)   Hunger Vital Sign    Worried About Running Out of Food in the Last Year: Never true    Ran Out of Food in the Last Year: Never true  Transportation Needs: No Transportation Needs (01/01/2024)   PRAPARE - Administrator, Civil Service (Medical): No    Lack of Transportation (Non-Medical): No  Physical Activity: Not on file  Stress: Not on file  Social  Connections: Moderately Isolated (01/01/2024)   Social Connection and Isolation Panel    Frequency of Communication with Friends and Family: Never    Frequency of Social Gatherings with Friends and Family: More than three times a week    Attends Religious Services: Never    Database Administrator or Organizations: Yes    Attends Banker Meetings: Never    Marital Status: Widowed  Intimate Partner Violence: Patient Unable To Answer (01/01/2024)   Humiliation, Afraid, Rape, and Kick questionnaire    Fear of Current or Ex-Partner: Patient unable to answer    Emotionally Abused: Patient unable to answer    Physically Abused: Patient unable to answer    Sexually Abused: Patient unable to answer  Depression (EYV7-0): Not on file  Alcohol Screen: Not on file  Housing: Low Risk (01/01/2024)   Housing Stability Vital Sign    Unable to Pay for Housing in the Last Year: No    Number of Times Moved in the Last Year: 0    Homeless in the Last Year: No  Utilities: Not At Risk (01/01/2024)   [employer]HC Utilities    Threatened with loss of utilities: No  Health Literacy: Not on file    PHYSICAL EXAM  GENERAL EXAM/CONSTITUTIONAL: Vitals:  Vitals:   09/10/24 1124  BP: 132/82  Pulse: 84  Resp: 15  Height: 5' 6 (1.676 m)      Body mass index is 39.87 kg/m. Wt Readings from Last 3 Encounters:  07/24/24 247 lb (112 kg)  05/27/24 237 lb 8 oz (107.7 kg)  01/01/24 300 lb (136.1 kg)   Patient is in no distress; well developed, nourished and groomed; neck is supple.   MUSCULOSKELETAL: Gait, strength, tone, movements noted in Neurologic exam below  NEUROLOGIC: MENTAL STATUS:      No data to display            09/10/2024   11:26 AM 04/18/2023   10:12 AM 04/13/2022   11:21 AM  Montreal Cognitive Assessment   Visuospatial/ Executive (0/5) 2 3 3   Naming (0/3) 3 3 2   Attention: Read list of digits (0/2) 2 1 1   Attention: Read list of letters (0/1) 1 1 1   Attention: Serial 7  subtraction starting at 100 (0/3) 0 2 3  Language: Repeat phrase (0/2) 0 1 2  Language : Fluency (0/1) 1 1 1   Abstraction (0/2) 2 2 2   Delayed Recall (0/5) 0 0 0  Orientation (0/6) 2 5 6   Total 13 19 21   Adjusted Score (based on education)  19 21    CRANIAL NERVE:  2nd, 3rd, 4th, 6th - visual fields full to confrontation, extraocular muscles intact, no nystagmus 5th - facial sensation symmetric 7th - facial strength symmetric 8th - hearing intact 9th - palate elevates symmetrically, uvula midline 11th - shoulder shrug symmetric 12th - tongue protrusion midline  MOTOR:  normal bulk and tone, full strength in the BUE, BLE  SENSORY:  normal and symmetric to light touch  COORDINATION:  finger-nose-finger, fine finger movements normal  GAIT/STATION:  Deferred using a wheelchair    DIAGNOSTIC DATA (LABS, IMAGING, TESTING) - I reviewed patient records, labs, notes, testing and imaging myself where available.  Lab Results  Component Value Date   WBC 6.1 01/02/2024   HGB 11.7 (L) 01/02/2024   HCT 38.2 01/02/2024   MCV 84.0 01/02/2024   PLT 244 01/02/2024      Component Value Date/Time   NA 136 01/05/2024 0650   K 3.4 (L) 01/05/2024 0650   CL 97 (L) 01/05/2024 0650   CO2 29 01/05/2024 0650   GLUCOSE 118 (H) 01/05/2024 0650   BUN 17 01/05/2024 0650   CREATININE 1.67 (H) 01/05/2024 0650   CREATININE 1.63 (H) 01/27/2020 1044   CALCIUM  9.3 01/05/2024 0650   PROT 7.2 01/02/2024 1117   ALBUMIN 2.8 (L) 01/02/2024 1117   AST 19 01/02/2024 1117   AST 12 (L) 01/27/2020 1044   ALT 8 01/02/2024 1117   ALT 9 01/27/2020 1044   ALKPHOS 71 01/02/2024 1117   BILITOT 0.7 01/02/2024 1117   BILITOT 0.3 01/27/2020 1044   GFRNONAA 30 (L) 01/05/2024 0650   GFRNONAA 29 (L) 01/27/2020 1044   GFRAA 34 (L) 01/27/2020 1044   Lab Results  Component Value Date   CHOL 116 12/07/2022   HDL 39 (L) 12/07/2022   LDLCALC 62 12/07/2022   TRIG 73 12/07/2022   CHOLHDL 3.0 12/07/2022   Lab  Results  Component Value Date   HGBA1C 6.9 (H) 01/01/2024   Lab Results  Component Value Date   VITAMINB12 646 01/02/2024   Lab Results  Component Value Date   TSH 2.323 01/02/2024  Head CT 03/23/22 No acute intracranial findings are seen in noncontrast CT brain. Atrophy. Small-vessel disease   MRI Brain 08/06/2022 Multiple single and confluent T2/FLAIR hyperintense foci in the cerebral hemispheres in a pattern most consistent with moderate chronic microvascular ischemic changes.  A focus is also noted in the pons.  There has been some progression compared to the 2017 MRI. Mild generalized cortical atrophy, progressed since the 2017 MRI and stable compared to the 2023 CT scan. Single chronic microhemorrhage in the right hemisphere is stable compared to the 2017 MRI. Mild chronic sphenoid sinusitis The internal auditory canals appeared normal on this noncontrast study.   The pituitary gland has a reduced height within an enlarged sella turcica, unchanged compared to the 2017 MRI.  A partially empty sella is nonspecific and could be incidental or due to elevated intracranial pressure.   No acute finding   Head CT 03/15/2023 1. No acute intracranial abnormality. 2. Unchanged moderate chronic small-vessel disease.   ASSESSMENT AND PLAN  89 y.o. year old female with history of obesity, hypertension, hyperlipidemia, diabetes, glaucoma, CAD and CKD and Dementia who is presenting for follow up for her dementia.   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 Memory decline with a decrease in cognitive test score from 19 to 13 since August. Previous trial of Aricept  was not tolerated due to side effects. Memantine was considered but contraindicated due to angioedema  risk with enalapril . Current management focuses on maintaining stability and comfort, with emphasis on reorientation and avoiding agitation. Memantine was not initiated due to contraindication with enalapril , which caused angioedema. - Continue current medication regimen, including Seroquel  12.5 mg in the morning and 50 mg at night. - Monitor for signs of UTI, such as confusion, agitation, or hallucinations, and address if present. - Educated family on managing repetitive questioning and conversations without confrontation. - Scheduled follow-up in six months to reassess cognitive function and medication efficacy.    1. Moderate late onset Alzheimer's dementia with other behavioral disturbance [facility])      Patient Instructions  Continue current medications  She was not able to tolerate Aricept  and Namenda is contraindicated due to allergy to Enalapril  (angioedema)  Continue with daily re-orientation  Return in 6 months or sooner if worse    No orders of the defined types were placed in this encounter.   No orders of the defined types were placed in this encounter.   Return in about 6 months (around 03/10/2025).  I have spent a total of 30 minutes dedicated to this patient today, preparing to see patient, performing a medically appropriate examination and evaluation, ordering tests and/or medications and procedures, and counseling and educating the patient/family/caregiver; independently interpreting result and communicating results to the family/patient/caregiver; and documenting clinical information in the electronic medical record.   Pastor Falling, MD 09/10/2024, 1:06 PM  [facility] [address], [address] [place], [place] [address] [phone]   "

## 2025-05-13 ENCOUNTER — Ambulatory Visit: Admitting: Neurology
